# Patient Record
Sex: Female | Born: 1940 | Race: White | Hispanic: No | Marital: Married | Smoking: Former smoker
Health system: Southern US, Community
[De-identification: ages and names within clinical notes are randomized; demographics above are authoritative.]

## PROBLEM LIST (undated history)

## (undated) DIAGNOSIS — I214 Non-ST elevation (NSTEMI) myocardial infarction: Secondary | ICD-10-CM

## (undated) DIAGNOSIS — E890 Postprocedural hypothyroidism: Secondary | ICD-10-CM

## (undated) DIAGNOSIS — I5189 Other ill-defined heart diseases: Secondary | ICD-10-CM

## (undated) DIAGNOSIS — M069 Rheumatoid arthritis, unspecified: Secondary | ICD-10-CM

## (undated) DIAGNOSIS — R001 Bradycardia, unspecified: Secondary | ICD-10-CM

## (undated) DIAGNOSIS — K219 Gastro-esophageal reflux disease without esophagitis: Secondary | ICD-10-CM

## (undated) DIAGNOSIS — R002 Palpitations: Secondary | ICD-10-CM

## (undated) DIAGNOSIS — K5790 Diverticulosis of intestine, part unspecified, without perforation or abscess without bleeding: Secondary | ICD-10-CM

## (undated) DIAGNOSIS — R5383 Other fatigue: Secondary | ICD-10-CM

## (undated) DIAGNOSIS — Z860101 Personal history of adenomatous and serrated colon polyps: Secondary | ICD-10-CM

## (undated) DIAGNOSIS — D563 Thalassemia minor: Secondary | ICD-10-CM

## (undated) DIAGNOSIS — M1712 Unilateral primary osteoarthritis, left knee: Secondary | ICD-10-CM

## (undated) DIAGNOSIS — D1771 Benign lipomatous neoplasm of kidney: Secondary | ICD-10-CM

## (undated) DIAGNOSIS — I2510 Atherosclerotic heart disease of native coronary artery without angina pectoris: Secondary | ICD-10-CM

## (undated) DIAGNOSIS — Z86010 Personal history of colonic polyps: Secondary | ICD-10-CM

## (undated) DIAGNOSIS — E785 Hyperlipidemia, unspecified: Secondary | ICD-10-CM

## (undated) HISTORY — DX: Palpitations: R00.2

## (undated) HISTORY — DX: Diverticulosis of intestine, part unspecified, without perforation or abscess without bleeding: K57.90

## (undated) HISTORY — DX: Unilateral primary osteoarthritis, left knee: M17.12

## (undated) HISTORY — DX: Bradycardia, unspecified: R00.1

## (undated) HISTORY — PX: UPPER GASTROINTESTINAL ENDOSCOPY: SHX188

## (undated) HISTORY — DX: Hyperlipidemia, unspecified: E78.5

## (undated) HISTORY — DX: Postprocedural hypothyroidism: E89.0

## (undated) HISTORY — DX: Thalassemia minor: D56.3

## (undated) HISTORY — DX: Personal history of adenomatous and serrated colon polyps: Z86.0101

## (undated) HISTORY — DX: Personal history of colonic polyps: Z86.010

## (undated) HISTORY — DX: Other fatigue: R53.83

## (undated) HISTORY — DX: Atherosclerotic heart disease of native coronary artery without angina pectoris: I25.10

## (undated) HISTORY — DX: Other ill-defined heart diseases: I51.89

## (undated) HISTORY — DX: Rheumatoid arthritis, unspecified: M06.9

## (undated) HISTORY — PX: BLADDER REPAIR: SHX76

## (undated) HISTORY — DX: Gastro-esophageal reflux disease without esophagitis: K21.9

## (undated) HISTORY — DX: Benign lipomatous neoplasm of kidney: D17.71

---

## 1967-09-14 HISTORY — PX: CHOLECYSTECTOMY: SHX55

## 1985-09-13 HISTORY — PX: ABDOMINAL HYSTERECTOMY: SHX81

## 1999-09-16 ENCOUNTER — Other Ambulatory Visit: Admission: RE | Admit: 1999-09-16 | Discharge: 1999-09-16 | Payer: Self-pay | Admitting: Obstetrics & Gynecology

## 2004-07-27 ENCOUNTER — Ambulatory Visit: Payer: Self-pay | Admitting: Internal Medicine

## 2004-08-11 ENCOUNTER — Ambulatory Visit: Payer: Self-pay | Admitting: Internal Medicine

## 2004-12-04 ENCOUNTER — Ambulatory Visit: Payer: Self-pay | Admitting: Internal Medicine

## 2004-12-15 ENCOUNTER — Ambulatory Visit: Payer: Self-pay | Admitting: Internal Medicine

## 2005-02-18 ENCOUNTER — Ambulatory Visit: Payer: Self-pay | Admitting: Internal Medicine

## 2005-03-24 ENCOUNTER — Ambulatory Visit: Payer: Self-pay | Admitting: Internal Medicine

## 2005-03-31 ENCOUNTER — Ambulatory Visit: Payer: Self-pay | Admitting: Internal Medicine

## 2005-07-02 ENCOUNTER — Ambulatory Visit: Payer: Self-pay | Admitting: Internal Medicine

## 2005-07-13 ENCOUNTER — Ambulatory Visit: Payer: Self-pay | Admitting: Internal Medicine

## 2005-09-13 DIAGNOSIS — M069 Rheumatoid arthritis, unspecified: Secondary | ICD-10-CM

## 2005-09-13 HISTORY — DX: Rheumatoid arthritis, unspecified: M06.9

## 2005-11-18 ENCOUNTER — Ambulatory Visit: Payer: Self-pay | Admitting: Internal Medicine

## 2005-11-25 ENCOUNTER — Encounter: Admission: RE | Admit: 2005-11-25 | Discharge: 2005-11-25 | Payer: Self-pay | Admitting: Internal Medicine

## 2005-12-10 ENCOUNTER — Ambulatory Visit: Payer: Self-pay | Admitting: Internal Medicine

## 2005-12-21 ENCOUNTER — Ambulatory Visit: Payer: Self-pay | Admitting: Internal Medicine

## 2006-01-06 ENCOUNTER — Ambulatory Visit: Payer: Self-pay | Admitting: Internal Medicine

## 2006-01-26 ENCOUNTER — Ambulatory Visit: Payer: Self-pay | Admitting: Internal Medicine

## 2006-02-16 ENCOUNTER — Ambulatory Visit: Payer: Self-pay | Admitting: Internal Medicine

## 2006-02-28 ENCOUNTER — Ambulatory Visit: Payer: Self-pay | Admitting: Internal Medicine

## 2006-04-11 ENCOUNTER — Ambulatory Visit: Payer: Self-pay | Admitting: Internal Medicine

## 2006-04-18 ENCOUNTER — Ambulatory Visit: Payer: Self-pay | Admitting: Internal Medicine

## 2006-05-19 ENCOUNTER — Ambulatory Visit: Payer: Self-pay | Admitting: Internal Medicine

## 2006-06-29 ENCOUNTER — Ambulatory Visit: Payer: Self-pay | Admitting: Internal Medicine

## 2006-08-30 ENCOUNTER — Ambulatory Visit: Payer: Self-pay | Admitting: Internal Medicine

## 2006-09-13 ENCOUNTER — Encounter: Payer: Self-pay | Admitting: Internal Medicine

## 2006-10-28 ENCOUNTER — Ambulatory Visit: Payer: Self-pay | Admitting: Internal Medicine

## 2006-11-07 ENCOUNTER — Ambulatory Visit: Payer: Self-pay | Admitting: Family Medicine

## 2006-11-11 ENCOUNTER — Ambulatory Visit: Payer: Self-pay | Admitting: Gastroenterology

## 2006-12-14 ENCOUNTER — Encounter: Payer: Self-pay | Admitting: Internal Medicine

## 2006-12-14 ENCOUNTER — Ambulatory Visit: Payer: Self-pay | Admitting: Gastroenterology

## 2006-12-14 ENCOUNTER — Encounter (INDEPENDENT_AMBULATORY_CARE_PROVIDER_SITE_OTHER): Payer: Self-pay | Admitting: *Deleted

## 2007-02-16 ENCOUNTER — Ambulatory Visit: Payer: Self-pay | Admitting: Internal Medicine

## 2007-02-16 LAB — CONVERTED CEMR LAB
Folate: 20 ng/mL
H Pylori IgG: NEGATIVE
Iron: 118 ug/dL (ref 42–145)
Vitamin B-12: 634 pg/mL (ref 211–911)

## 2007-03-16 ENCOUNTER — Ambulatory Visit: Payer: Self-pay | Admitting: Internal Medicine

## 2007-05-12 ENCOUNTER — Encounter: Payer: Self-pay | Admitting: Internal Medicine

## 2007-05-12 DIAGNOSIS — M810 Age-related osteoporosis without current pathological fracture: Secondary | ICD-10-CM | POA: Insufficient documentation

## 2007-05-12 DIAGNOSIS — R002 Palpitations: Secondary | ICD-10-CM | POA: Insufficient documentation

## 2007-05-12 DIAGNOSIS — M069 Rheumatoid arthritis, unspecified: Secondary | ICD-10-CM | POA: Insufficient documentation

## 2007-05-12 DIAGNOSIS — M899 Disorder of bone, unspecified: Secondary | ICD-10-CM | POA: Insufficient documentation

## 2007-05-12 DIAGNOSIS — M949 Disorder of cartilage, unspecified: Secondary | ICD-10-CM | POA: Insufficient documentation

## 2007-05-12 DIAGNOSIS — E785 Hyperlipidemia, unspecified: Secondary | ICD-10-CM | POA: Insufficient documentation

## 2007-05-16 ENCOUNTER — Encounter: Payer: Self-pay | Admitting: Internal Medicine

## 2007-06-28 ENCOUNTER — Ambulatory Visit: Payer: Self-pay | Admitting: Internal Medicine

## 2007-06-28 LAB — CONVERTED CEMR LAB
Basophils Absolute: 0 10*3/uL (ref 0.0–0.1)
Basophils Relative: 0.8 % (ref 0.0–1.0)
HCT: 35.9 % — ABNORMAL LOW (ref 36.0–46.0)
Hemoglobin: 12.4 g/dL (ref 12.0–15.0)
MCHC: 34.4 g/dL (ref 30.0–36.0)
MCV: 95.2 fL (ref 78.0–100.0)
Neutrophils Relative %: 56.1 % (ref 43.0–77.0)
RDW: 13.7 % (ref 11.5–14.6)
Vitamin B-12: 793 pg/mL (ref 211–911)

## 2007-07-10 ENCOUNTER — Ambulatory Visit: Payer: Self-pay | Admitting: Internal Medicine

## 2007-07-10 DIAGNOSIS — D568 Other thalassemias: Secondary | ICD-10-CM | POA: Insufficient documentation

## 2007-07-10 DIAGNOSIS — K5730 Diverticulosis of large intestine without perforation or abscess without bleeding: Secondary | ICD-10-CM | POA: Insufficient documentation

## 2007-07-10 DIAGNOSIS — Z860100 Personal history of colon polyps, unspecified: Secondary | ICD-10-CM | POA: Insufficient documentation

## 2007-09-19 ENCOUNTER — Encounter: Payer: Self-pay | Admitting: Internal Medicine

## 2007-10-09 ENCOUNTER — Ambulatory Visit: Payer: Self-pay | Admitting: Internal Medicine

## 2007-10-09 LAB — CONVERTED CEMR LAB
ALT: 28 units/L (ref 0–35)
AST: 24 units/L (ref 0–37)
Alkaline Phosphatase: 43 units/L (ref 39–117)
Cholesterol, target level: 200 mg/dL
Cholesterol: 236 mg/dL (ref 0–200)
Direct LDL: 160.3 mg/dL
HDL: 49 mg/dL (ref >39.0)
LDL Goal: 160 mg/dL
Total Bilirubin: 0.7 mg/dL (ref 0.3–1.2)
Vit D, 1,25-Dihydroxy: 48 (ref 30–89)

## 2007-10-20 ENCOUNTER — Telehealth: Payer: Self-pay | Admitting: *Deleted

## 2007-12-26 ENCOUNTER — Ambulatory Visit: Payer: Self-pay | Admitting: Internal Medicine

## 2007-12-26 DIAGNOSIS — J309 Allergic rhinitis, unspecified: Secondary | ICD-10-CM | POA: Insufficient documentation

## 2007-12-27 ENCOUNTER — Telehealth: Payer: Self-pay | Admitting: Internal Medicine

## 2008-02-08 ENCOUNTER — Encounter: Payer: Self-pay | Admitting: Internal Medicine

## 2008-03-26 ENCOUNTER — Ambulatory Visit: Payer: Self-pay | Admitting: Internal Medicine

## 2008-04-04 ENCOUNTER — Telehealth: Payer: Self-pay | Admitting: Internal Medicine

## 2008-04-05 ENCOUNTER — Ambulatory Visit: Payer: Self-pay | Admitting: Internal Medicine

## 2008-04-30 ENCOUNTER — Telehealth: Payer: Self-pay | Admitting: Internal Medicine

## 2008-06-06 ENCOUNTER — Encounter: Payer: Self-pay | Admitting: Internal Medicine

## 2008-06-27 ENCOUNTER — Ambulatory Visit: Payer: Self-pay | Admitting: Internal Medicine

## 2008-06-27 DIAGNOSIS — T887XXA Unspecified adverse effect of drug or medicament, initial encounter: Secondary | ICD-10-CM | POA: Insufficient documentation

## 2008-06-27 LAB — CONVERTED CEMR LAB
ALT: 31 units/L (ref 0–35)
AST: 26 units/L (ref 0–37)
Albumin: 4 g/dL (ref 3.5–5.2)
Alkaline Phosphatase: 49 units/L (ref 39–117)
CO2: 29 meq/L (ref 19–32)
Creatinine, Ser: 0.8 mg/dL (ref 0.4–1.2)
Eosinophils Absolute: 0.1 10*3/uL (ref 0.0–0.7)
GFR calc Af Amer: 92 mL/min
Lymphocytes Relative: 26.4 % (ref 12.0–46.0)
Monocytes Absolute: 0.4 10*3/uL (ref 0.1–1.0)
Monocytes Relative: 6.9 % (ref 3.0–12.0)
Neutro Abs: 4 10*3/uL (ref 1.4–7.7)
Neutrophils Relative %: 63.9 % (ref 43.0–77.0)
Platelets: 240 10*3/uL (ref 150–400)
RDW: 13.2 % (ref 11.5–14.6)
Total CHOL/HDL Ratio: 4.4
Triglycerides: 80 mg/dL (ref 0–149)
VLDL: 16 mg/dL (ref 0–40)

## 2008-07-08 LAB — CONVERTED CEMR LAB: Vit D, 1,25-Dihydroxy: 38 (ref 30–89)

## 2008-07-29 ENCOUNTER — Telehealth: Payer: Self-pay | Admitting: *Deleted

## 2008-08-20 ENCOUNTER — Encounter: Payer: Self-pay | Admitting: Internal Medicine

## 2008-08-23 ENCOUNTER — Encounter: Admission: RE | Admit: 2008-08-23 | Discharge: 2008-08-23 | Payer: Self-pay | Admitting: Internal Medicine

## 2008-08-29 ENCOUNTER — Telehealth (INDEPENDENT_AMBULATORY_CARE_PROVIDER_SITE_OTHER): Payer: Self-pay | Admitting: *Deleted

## 2008-10-07 ENCOUNTER — Encounter: Payer: Self-pay | Admitting: Internal Medicine

## 2008-11-07 ENCOUNTER — Ambulatory Visit: Payer: Self-pay | Admitting: Internal Medicine

## 2008-11-07 DIAGNOSIS — M79609 Pain in unspecified limb: Secondary | ICD-10-CM | POA: Insufficient documentation

## 2008-11-19 ENCOUNTER — Encounter: Payer: Self-pay | Admitting: Internal Medicine

## 2008-12-05 ENCOUNTER — Ambulatory Visit: Payer: Self-pay | Admitting: Internal Medicine

## 2008-12-18 ENCOUNTER — Encounter: Payer: Self-pay | Admitting: Internal Medicine

## 2009-01-06 ENCOUNTER — Ambulatory Visit: Payer: Self-pay | Admitting: Internal Medicine

## 2009-01-07 ENCOUNTER — Ambulatory Visit: Payer: Self-pay | Admitting: Internal Medicine

## 2009-01-07 ENCOUNTER — Encounter: Payer: Self-pay | Admitting: Internal Medicine

## 2009-01-09 ENCOUNTER — Encounter: Payer: Self-pay | Admitting: Internal Medicine

## 2009-02-27 ENCOUNTER — Telehealth: Payer: Self-pay | Admitting: Internal Medicine

## 2009-02-27 ENCOUNTER — Ambulatory Visit: Payer: Self-pay | Admitting: Family Medicine

## 2009-02-27 DIAGNOSIS — S139XXA Sprain of joints and ligaments of unspecified parts of neck, initial encounter: Secondary | ICD-10-CM | POA: Insufficient documentation

## 2009-04-07 ENCOUNTER — Ambulatory Visit: Payer: Self-pay | Admitting: Internal Medicine

## 2009-04-07 DIAGNOSIS — M19049 Primary osteoarthritis, unspecified hand: Secondary | ICD-10-CM | POA: Insufficient documentation

## 2009-05-08 ENCOUNTER — Ambulatory Visit: Payer: Self-pay | Admitting: Internal Medicine

## 2009-05-14 ENCOUNTER — Telehealth (INDEPENDENT_AMBULATORY_CARE_PROVIDER_SITE_OTHER): Payer: Self-pay | Admitting: *Deleted

## 2009-05-15 ENCOUNTER — Encounter: Payer: Self-pay | Admitting: Internal Medicine

## 2009-07-23 ENCOUNTER — Ambulatory Visit: Payer: Self-pay | Admitting: Internal Medicine

## 2009-07-23 LAB — CONVERTED CEMR LAB
ALT: 23 units/L (ref 0–35)
AST: 26 units/L (ref 0–37)
Albumin: 3.7 g/dL (ref 3.5–5.2)
Alkaline Phosphatase: 52 units/L (ref 39–117)
Basophils Absolute: 0.1 10*3/uL (ref 0.0–0.1)
Bilirubin, Direct: 0.1 mg/dL (ref 0.0–0.3)
CO2: 27 meq/L (ref 19–32)
Cholesterol: 225 mg/dL — ABNORMAL HIGH (ref 0–200)
Direct LDL: 154.2 mg/dL
Eosinophils Absolute: 0.1 10*3/uL (ref 0.0–0.7)
Eosinophils Relative: 2.7 % (ref 0.0–5.0)
Glucose, Urine, Semiquant: NEGATIVE
Hemoglobin: 12.7 g/dL (ref 12.0–15.0)
Ketones, urine, test strip: NEGATIVE
Lymphocytes Relative: 32 % (ref 12.0–46.0)
Neutro Abs: 2.9 10*3/uL (ref 1.4–7.7)
Neutrophils Relative %: 55.5 % (ref 43.0–77.0)
Protein, U semiquant: NEGATIVE
RBC: 3.84 M/uL — ABNORMAL LOW (ref 3.87–5.11)
Sodium: 139 meq/L (ref 135–145)
Specific Gravity, Urine: 1.015
Total Protein: 6.9 g/dL (ref 6.0–8.3)
WBC Urine, dipstick: NEGATIVE
WBC: 5.2 10*3/uL (ref 4.5–10.5)
pH: 7

## 2009-07-30 ENCOUNTER — Ambulatory Visit: Payer: Self-pay | Admitting: Internal Medicine

## 2009-08-13 ENCOUNTER — Encounter (INDEPENDENT_AMBULATORY_CARE_PROVIDER_SITE_OTHER): Payer: Self-pay | Admitting: *Deleted

## 2009-08-29 ENCOUNTER — Telehealth: Payer: Self-pay | Admitting: Internal Medicine

## 2009-09-24 ENCOUNTER — Encounter: Payer: Self-pay | Admitting: Internal Medicine

## 2009-11-21 ENCOUNTER — Ambulatory Visit: Payer: Self-pay | Admitting: Internal Medicine

## 2009-11-21 LAB — CONVERTED CEMR LAB
Cholesterol: 228 mg/dL — ABNORMAL HIGH (ref 0–200)
HDL: 62.6 mg/dL (ref >39.00)
Total CHOL/HDL Ratio: 4
Total Protein: 6.8 g/dL (ref 6.0–8.3)

## 2009-11-28 ENCOUNTER — Ambulatory Visit: Payer: Self-pay | Admitting: Internal Medicine

## 2009-11-28 LAB — CONVERTED CEMR LAB: Cholesterol, target level: 200 mg/dL

## 2010-01-21 ENCOUNTER — Encounter: Payer: Self-pay | Admitting: Internal Medicine

## 2010-04-08 ENCOUNTER — Ambulatory Visit: Payer: Self-pay | Admitting: Internal Medicine

## 2010-04-08 LAB — CONVERTED CEMR LAB
ALT: 20 units/L (ref 0–35)
AST: 22 units/L (ref 0–37)
Albumin: 3.8 g/dL (ref 3.5–5.2)
Alkaline Phosphatase: 51 units/L (ref 39–117)
Bilirubin, Direct: 0.1 mg/dL (ref 0.0–0.3)
Direct LDL: 166.1 mg/dL
HDL: 61.9 mg/dL (ref >39.00)
Total Protein: 6.7 g/dL (ref 6.0–8.3)
Triglycerides: 79 mg/dL (ref 0.0–149.0)

## 2010-05-13 ENCOUNTER — Ambulatory Visit: Payer: Self-pay | Admitting: Internal Medicine

## 2010-05-21 ENCOUNTER — Encounter: Payer: Self-pay | Admitting: Internal Medicine

## 2010-08-13 ENCOUNTER — Ambulatory Visit: Payer: Self-pay | Admitting: Internal Medicine

## 2010-08-13 LAB — CONVERTED CEMR LAB
Albumin: 3.8 g/dL (ref 3.5–5.2)
Bilirubin, Direct: 0.4 mg/dL — ABNORMAL HIGH (ref 0.0–0.3)
Cholesterol: 193 mg/dL (ref 0–200)
LDL Cholesterol: 128 mg/dL — ABNORMAL HIGH (ref 0–99)
Total Protein: 6.7 g/dL (ref 6.0–8.3)
Triglycerides: 29 mg/dL (ref 0.0–149.0)

## 2010-08-20 ENCOUNTER — Ambulatory Visit: Payer: Self-pay | Admitting: Internal Medicine

## 2010-08-20 LAB — CONVERTED CEMR LAB
ALT: 18 U/L
AST: 24 U/L
Albumin: 3.8 g/dL
Alkaline Phosphatase: 52 U/L
Bilirubin, Direct: 0.1 mg/dL
HDL goal, serum: 40 mg/dL
Total Bilirubin: 0.6 mg/dL
Total Protein: 6.6 g/dL

## 2010-08-24 ENCOUNTER — Telehealth: Payer: Self-pay | Admitting: Internal Medicine

## 2010-09-13 DIAGNOSIS — E890 Postprocedural hypothyroidism: Secondary | ICD-10-CM

## 2010-09-13 HISTORY — PX: VAGINAL PROLAPSE REPAIR: SHX830

## 2010-09-13 HISTORY — DX: Postprocedural hypothyroidism: E89.0

## 2010-09-13 HISTORY — PX: THYROIDECTOMY: SHX17

## 2010-09-17 ENCOUNTER — Encounter: Payer: Self-pay | Admitting: Internal Medicine

## 2010-10-15 NOTE — Letter (Signed)
Summary: [facility]   Imported By: Maryln Gottron 09/28/2010 15:29:43  _____________________________________________________________________  External Attachment:    Type:   Image     Comment:   External Document

## 2010-10-15 NOTE — Assessment & Plan Note (Signed)
Summary: 4 month rov/njr PT RSC/NJR   Vital Signs:  Patient profile:   70 year old female Height:      68 inches Weight:      166 pounds BMI:     25.33 Temp:     98.2 degrees F oral Pulse rate:   72 / minute Resp:     14 per minute BP sitting:   120 / 70  (left arm)  Vitals Entered By: Willy Eddy, LPN (May 13, 2010 2:14 PM) CC: roal labs Is Patient Diabetic? No   Primary Care Provider:  Stacie Glaze MD  CC:  roal labs.  History of Present Illness: lipid management  Hyperlipidemia Follow-Up      This is a 70 year old woman who presents for Hyperlipidemia follow-up.  The patient denies muscle aches, GI upset, abdominal pain, flushing, itching, constipation, diarrhea, and fatigue.  The patient denies the following symptoms: chest pain/pressure, exercise intolerance, dypsnea, palpitations, syncope, and pedal edema.  Compliance with medications (by patient report) has been near 100%.  Dietary compliance has been excellent.  The patient reports exercising daily.  Adjunctive measures currently used by the patient include fish oil supplements.    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  (ICD-715.34) 2)  Cervical Spasm  (ICD-847.0) 3)  Foot Pain, Chronic  (ICD-729.5) 4)  Uns Advrs Eff Uns Rx Medicinal&biological Sbstnc  (ICD-995.20) 5)  Allergic Rhinitis  (ICD-477.9) 6)  Hypertension  (ICD-401.9) 7)  Thalassemia Nec  (ICD-282.49) 8)  Diverticulosis, Colon  (ICD-562.10) 9)  Colonic Polyps, Hx of  (ICD-V12.72) 10)  Osteopenia  (ICD-733.90) 11)  Palpitations  (ICD-785.1) 12)  Osteoporosis  (ICD-733.00) 13)  Rheumatoid Arthritis  (ICD-714.0) 14)  Hyperlipidemia  (ICD-272.4)  Current Problems (verified): 1)  Loc  Osteoarthros Not Spec Whether Prim/sec Hand  (ICD-715.34) 2)  Cervical Spasm  (ICD-847.0) 3)  Foot Pain, Chronic  (ICD-729.5) 4)  Uns Advrs Eff Uns Rx Medicinal&biological Sbstnc  (ICD-995.20) 5)  Allergic Rhinitis  (ICD-477.9) 6)  Hypertension  (ICD-401.9) 7)  Thalassemia Nec  (ICD-282.49) 8)  Diverticulosis, Colon  (ICD-562.10) 9)  Colonic Polyps, Hx of  (ICD-V12.72) 10)  Osteopenia  (ICD-733.90) 11)  Palpitations  (ICD-785.1) 12)  Osteoporosis  (ICD-733.00) 13)  Rheumatoid Arthritis  (ICD-714.0) 14)  Hyperlipidemia  (ICD-272.4)  Medications Prior to Update: 1)  Aspirin 81 Mg  Tbec (Aspirin) .... Once Daily 2)  Nasonex 50 Mcg/act  Susp (Mometasone Furoate) .... As Needed 3)  Astelin 137 Mcg/spray  Soln (Azelastine Hcl) .... As Needed 4)  Methotrexate 2.5 Mg  Tabs (Methotrexate Sodium) .... 6 Tabs Per Week 5)  Multivitamins   Caps (Multiple Vitamin) .... Once Daily 6)  Tylenol 325 Mg  Tabs (Acetaminophen) .... As Needed 7)  Aciphex 20 Mg  Tbec (Rabeprazole Sodium) .... As Needed 8)  Vitamin D3 1000 Unit Caps (Cholecalciferol) .Marland Kitchen.. 1 Two Times A Day 9)  Caltrate 600+d Plus 600-400 Mg-Unit Tabs (Calcium Carbonate-Vit D-Min) .... 2 Once Daily 10)  Viactiv 500-100-40  Chew (Calcium-Vitamin D-Vitamin K) .... Once Daily 11)  Vitamin B-12 1000 Mcg  Tabs (Cyanocobalamin) .... Once Daily 12)  Folic Acid 1 Mg  Tabs (Folic  Acid) .... One By Mouth Daily 13)  Fish Oil 1600 Mg/.1 Tsp .Marland Kitchen.. 1 Tsp Two Times A Day 14)  Vagifem 10 Mcg Tabs (Estradiol) .Marland Kitchen.. 1 Per Week 15)  Ocusoft Lid Scrub  Pads (Eyelid Cleansers) .... Two Times A Day 16)  Doxycycline Hyclate 100 Mg Solr (Doxycycline Hyclate) .Marland Kitchen.. 1 Once Daily As Needed 17)  Azasite 1 % Soln (Azithromycin) .Marland Kitchen.. 1 Once Daily As Needed  Current Medications (verified): 1)  Aspirin 81 Mg  Tbec (Aspirin) .... Once Daily 2)  Nasonex 50 Mcg/act  Susp (Mometasone Furoate) .... As Needed 3)  Astelin 137 Mcg/spray  Soln (Azelastine Hcl) .... As Needed 4)   Methotrexate 2.5 Mg  Tabs (Methotrexate Sodium) .... 6 Tabs Per Week 5)  Multivitamins   Caps (Multiple Vitamin) .... Once Daily 6)  Tylenol 325 Mg  Tabs (Acetaminophen) .... As Needed 7)  Aciphex 20 Mg  Tbec (Rabeprazole Sodium) .... As Needed 8)  Vitamin D3 1000 Unit Caps (Cholecalciferol) .Marland Kitchen.. 1 Two Times A Day 9)  Caltrate 600+d Plus 600-400 Mg-Unit Tabs (Calcium Carbonate-Vit D-Min) .... 2 Once Daily 10)  Viactiv 500-100-40  Chew (Calcium-Vitamin D-Vitamin K) .... Once Daily 11)  Vitamin B-12 1000 Mcg  Tabs (Cyanocobalamin) .... Once Daily 12)  Folic Acid 1 Mg  Tabs (Folic Acid) .... One By Mouth Daily 13)  Fish Oil 1600 Mg/.1 Tsp .Marland Kitchen.. 1 Tsp Two Times A Day 14)  Vagifem 10 Mcg Tabs (Estradiol) .Marland Kitchen.. 1 Per Week 15)  Ocusoft Lid Scrub  Pads (Eyelid Cleansers) .... Two Times A Day 16)  Doxycycline Hyclate 100 Mg Solr (Doxycycline Hyclate) .Marland Kitchen.. 1 Once Daily As Needed 17)  Azasite 1 % Soln (Azithromycin) .Marland Kitchen.. 1 Once Daily As Needed  Allergies (verified): 1)  ! Vagisil (Benzocaine-Resorcinol)  Past History:  Family History: Last updated: 07/10/2007 mother dementia father killed early Family History Hypertension  Social History: Last updated: 07/10/2007 Married Former Smoker Alcohol use-yes Drug use-no  Risk Factors: Alcohol Use: 1 (05/13/2010) Caffeine Use: 2 (07/10/2007)  Risk Factors: Smoking Status: quit (05/13/2010) Packs/Day: 0.25 (05/13/2010) Passive Smoke Exposure: no (05/13/2010)  Past medical, surgical, family and social histories (including risk factors) reviewed, and no changes noted (except as noted below).  Past Medical History: Reviewed history from 12/26/2007 and no changes required. Hyperlipidemia Rheumatoid arthritis Osteopenia thalium palpitations Colonic polyps, hx of Diverticulosis, colon Hypertension Allergic rhinitis  Past Surgical History: Reviewed history from 07/10/2007 and no changes required. Colon  polypectomy Cholecystectomy Hysterectomy  Family History: Reviewed history from 07/10/2007 and no changes required. mother dementia father killed early Family History Hypertension  Social History: Reviewed history from 07/10/2007 and no changes required. Married Former Smoker Alcohol use-yes Drug use-no  Review of Systems  The patient denies anorexia, fever, weight loss, weight gain, vision loss, decreased hearing, hoarseness, chest pain, syncope, dyspnea on exertion, peripheral edema, prolonged cough, headaches, hemoptysis, abdominal pain, melena, hematochezia, severe indigestion/heartburn, hematuria, incontinence, genital sores, muscle weakness, suspicious skin lesions, transient blindness, difficulty walking, depression, unusual weight change, abnormal bleeding, enlarged lymph nodes, angioedema, and breast masses.    Physical Exam  General:  Well-developed,well-nourished,in no acute distress; alert,appropriate and cooperative throughout examination Head:  Normocephalic and atraumatic without obvious abnormalities. No apparent alopecia or balding. Eyes:  pupils equal and pupils round.   Ears:  R ear normal and L ear normal.   Mouth:  Oral mucosa and oropharynx without lesions or exudates.  Teeth in good repair. Neck:  No deformities, masses, or tenderness noted. Lungs:  Normal  respiratory effort, chest expands symmetrically. Lungs are clear to auscultation, no crackles or wheezes. Heart:  Normal rate and regular rhythm. S1 and S2 normal without gallop, murmur, click, rub or other extra sounds.   Impression & Recommendations:  Problem # 1:  HYPERTENSION (ICD-401.9)  BP today: 120/70 Prior BP: 136/80 (11/28/2009)  Prior 10 Yr Risk Heart Disease: Not enough information (10/09/2007)  Labs Reviewed: K+: 3.6 (07/23/2009) Creat: : 0.7 (07/23/2009)   Chol: 251 (04/08/2010)   HDL: 61.90 (04/08/2010)   LDL: DEL (06/27/2008)   TG: 79.0 (04/08/2010)  Problem # 2:  HYPERLIPIDEMIA,  WITH HIGH HDL (ICD-272.4) add livalo pulse and moniter in 3 months Labs Reviewed: SGOT: 22 (04/08/2010)   SGPT: 20 (04/08/2010)  Lipid Goals: Chol Goal: 200 (11/28/2009)   HDL Goal: 40 (11/28/2009)   LDL Goal: 160 (11/28/2009)   TG Goal: 150 (11/28/2009)  Prior 10 Yr Risk Heart Disease: Not enough information (10/09/2007)   HDL:61.90 (04/08/2010), 62.60 (11/21/2009)  LDL:DEL (06/27/2008), DEL (10/09/2007)  Chol:251 (04/08/2010), 228 (11/21/2009)  Trig:79.0 (04/08/2010), 86.0 (11/21/2009)  Her updated medication list for this problem includes:    Livalo 4 Mg Tabs (Pitavastatin calcium) ..... One by mouth every monday night  Complete Medication List: 1)  Aspirin 81 Mg Tbec (Aspirin) .... Once daily 2)  Nasonex 50 Mcg/act Susp (Mometasone furoate) .... As needed 3)  Astelin 137 Mcg/spray Soln (Azelastine hcl) .... As needed 4)  Methotrexate 2.5 Mg Tabs (Methotrexate sodium) .... 6 tabs per week 5)  Multivitamins Caps (Multiple vitamin) .... Once daily 6)  Tylenol 325 Mg Tabs (Acetaminophen) .... As needed 7)  Aciphex 20 Mg Tbec (Rabeprazole sodium) .... As needed 8)  Vitamin D3 1000 Unit Caps (Cholecalciferol) .Marland Kitchen.. 1 two times a day 9)  Caltrate 600+d Plus 600-400 Mg-unit Tabs (Calcium carbonate-vit d-min) .... 2 once daily 10)  Viactiv 500-100-40 Chew (Calcium-vitamin d-vitamin k) .... Once daily 11)  Vitamin B-12 1000 Mcg Tabs (Cyanocobalamin) .... Once daily 12)  Folic Acid 1 Mg Tabs (Folic acid) .... One by mouth daily 13)  Fish Oil 1600 Mg/.1 Tsp  .Marland Kitchen.. 1 tsp two times a day 14)  Vagifem 10 Mcg Tabs (Estradiol) .Marland Kitchen.. 1 per week 15)  Ocusoft Lid Scrub Pads (Eyelid cleansers) .... Two times a day 16)  Doxycycline Hyclate 100 Mg Solr (Doxycycline hyclate) .Marland Kitchen.. 1 once daily as needed 17)  Azasite 1 % Soln (Azithromycin) .Marland Kitchen.. 1 once daily as needed 18)  Livalo 4 Mg Tabs (Pitavastatin calcium) .... One by mouth every monday night  Patient Instructions: 1)  Please schedule a follow-up  appointment in 3 months. 2)  Hepatic Panel prior to visit, ICD-9:995.20 3)  Lipid Panel prior to visit, ICD-9:272.4

## 2010-10-15 NOTE — Letter (Signed)
Summary: [facility]   Imported By: Maryln Gottron 06/02/2010 10:38:45  _____________________________________________________________________  External Attachment:    Type:   Image     Comment:   External Document

## 2010-10-15 NOTE — Assessment & Plan Note (Signed)
Summary: 3 month fup//ccm   Vital Signs:  Patient profile:   70 year old female Height:      68 inches Weight:      166 pounds BMI:     25.33 Temp:     98.2 degrees F oral Pulse rate:   60 / minute Resp:     14 per minute BP sitting:   130 / 70  (left arm)  Vitals Entered By: Willy Eddy, LPN (August 20, 2010 9:05 AM) CC: roa labs, Lipid Management Is Patient Diabetic? No   Primary Care Provider:  Stacie Glaze MD  CC:  roa labs and Lipid Management.  History of Present Illness: Has not been exercizing. Hyperlipidemia Follow-Up      This is a 70 year old woman who presents for Hyperlipidemia follow-up.  The patient denies muscle aches, GI upset, abdominal pain, flushing, itching, constipation, diarrhea, and fatigue.  The patient denies the following symptoms: chest pain/pressure, exercise intolerance, dypsnea, palpitations, syncope, and pedal edema.  Compliance with medications (by patient report) has been near 100%.  Dietary compliance has been excellent.  The patient reports exercising occasionally.  Adjunctive measures currently used by the patient include weight reduction.    Lipid Management History:      Positive NCEP/ATP III risk factors include female age 69 years old or older and hypertension.  Negative NCEP/ATP III risk factors include non-diabetic, no family history for ischemic heart disease, non-tobacco-user status, no ASHD (atherosclerotic heart disease), no prior stroke/TIA, no peripheral vascular disease, and no history of aortic aneurysm.     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  (ICD-715.34) 3)  Cervical Spasm  (ICD-847.0) 4)  Foot Pain, Chronic   (ICD-729.5) 5)  Uns Advrs Eff Uns Rx Medicinal&biological Sbstnc  (ICD-995.20) 6)  Allergic Rhinitis  (ICD-477.9) 7)  Hypertension  (ICD-401.9) 8)  Thalassemia Nec  (ICD-282.49) 9)  Diverticulosis, Colon  (ICD-562.10) 10)  Colonic Polyps, Hx of  (ICD-V12.72) 11)  Osteopenia  (ICD-733.90) 12)  Palpitations  (ICD-785.1) 13)  Osteoporosis  (ICD-733.00) 14)  Rheumatoid Arthritis  (ICD-714.0) 15)  Hyperlipidemia  (ICD-272.4)  Current Problems (verified): 1)  Hyperlipidemia, With High Hdl  (ICD-272.4) 2)  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 Once Daily 2)  Nasonex 50 Mcg/act  Susp (Mometasone Furoate) .... As Needed 3)  Astelin 137 Mcg/spray  Soln (Azelastine Hcl) .... As Needed 4)  Methotrexate 2.5 Mg  Tabs (Methotrexate Sodium) .... 6 Tabs Per Week 5)  Multivitamins   Caps (Multiple Vitamin) .... Once Daily 6)  Tylenol  325 Mg  Tabs (Acetaminophen) .... As Needed 7)  Aciphex 20 Mg  Tbec (Rabeprazole Sodium) .... As Needed 8)  Vitamin D3 1000 Unit Caps (Cholecalciferol) .Marland Kitchen.. 1 Two Times A Day 9)  Caltrate 600+d Plus 600-400 Mg-Unit Tabs (Calcium Carbonate-Vit D-Min) .... 2 Once Daily 10)  Viactiv 500-100-40  Chew (Calcium-Vitamin D-Vitamin K) .... Once Daily 11)  Vitamin B-12 1000 Mcg  Tabs (Cyanocobalamin) .... Once Daily 12)  Folic Acid 1 Mg  Tabs (Folic Acid) .... One By Mouth Daily 13)  Fish Oil 1600 Mg/.1 Tsp  .Marland Kitchen.. 1 Tsp Two Times A Day 14)  Vagifem 10 Mcg Tabs (Estradiol) .Marland Kitchen.. 1 Per Week 15)  Ocusoft Lid Scrub  Pads (Eyelid Cleansers) .... Two Times A Day 16)  Doxycycline Hyclate 100 Mg Solr (Doxycycline Hyclate) .Marland Kitchen.. 1 Once Daily As Needed 17)  Azasite 1 % Soln (Azithromycin) .Marland Kitchen.. 1 Once Daily As Needed 18)  Livalo 4 Mg Tabs (Pitavastatin Calcium) .... One By Mouth Every Monday Night  Current Medications (verified): 1)  Aspirin 81 Mg  Tbec (Aspirin) .... Once Daily 2)  Nasonex 50 Mcg/act  Susp (Mometasone Furoate) .... As Needed 3)  Astelin 137 Mcg/spray  Soln (Azelastine Hcl) .... As Needed 4)  Methotrexate 2.5 Mg  Tabs (Methotrexate Sodium) .... 6 Tabs Per Week 5)  Multivitamins   Caps (Multiple Vitamin) .... Once Daily 6)  Tylenol 325 Mg  Tabs (Acetaminophen) .... As Needed 7)  Aciphex 20 Mg  Tbec (Rabeprazole Sodium) .... As Needed 8)  Vitamin D3 1000 Unit Caps (Cholecalciferol) .Marland Kitchen.. 1 Two Times A Day 9)  Caltrate 600+d Plus 600-400 Mg-Unit Tabs (Calcium Carbonate-Vit D-Min) .... 2 Once Daily 10)  Viactiv 500-100-40  Chew (Calcium-Vitamin D-Vitamin K) .... Once Daily 11)  Vitamin B-12 1000 Mcg  Tabs (Cyanocobalamin) .... Once Daily 12)  Folic Acid 1 Mg  Tabs (Folic Acid) .... One By Mouth Daily 13)  Fish Oil 1600 Mg/.1 Tsp .Marland Kitchen.. 1 Tsp Two Times A Day 14)  Vagifem 10 Mcg Tabs (Estradiol) .Marland Kitchen.. 1 Per Week 15)  Ocusoft Lid Scrub  Pads (Eyelid Cleansers) .... Two Times A Day 16)  Doxycycline Hyclate 100 Mg Solr (Doxycycline Hyclate) .Marland Kitchen.. 1 Once Daily As Needed 17)  Azasite 1 % Soln (Azithromycin) .Marland Kitchen.. 1 Once Daily As Needed 18)  Livalo 4 Mg Tabs (Pitavastatin Calcium) .... One By Mouth Every Monday Night  Allergies (verified): 1)  ! Vagisil (Benzocaine-Resorcinol)  Past History:  Family History: Last updated: 07/10/2007 mother dementia father killed early Family History Hypertension  Social History: Last updated: 07/10/2007 Married Former Smoker Alcohol use-yes Drug  use-no  Risk Factors: Alcohol Use: 1 (08/20/2010) Caffeine Use: 2 (07/10/2007)  Risk Factors: Smoking Status: quit (08/20/2010) Packs/Day: 0.25 (08/20/2010) Passive Smoke Exposure: no (08/20/2010)  Past medical, surgical, family and social histories (including risk factors) reviewed, and no changes noted (except as noted below).  Past Medical History: Reviewed history from 12/26/2007 and no changes required. Hyperlipidemia Rheumatoid arthritis Osteopenia thalium palpitations Colonic polyps, hx of Diverticulosis, colon Hypertension Allergic rhinitis  Past Surgical History: Reviewed history from 07/10/2007 and no changes required. Colon polypectomy Cholecystectomy Hysterectomy  Family History: Reviewed history from 07/10/2007 and no changes required. mother dementia father killed early Family History Hypertension  Social History: Reviewed history from 07/10/2007 and no changes required. Married Former Smoker Alcohol use-yes Drug use-no  Review of Systems       Flu Vaccine Consent Questions     Do you have a history of severe allergic reactions  to this vaccine? no    Any prior history of allergic reactions to egg and/or gelatin? no    Do you have a sensitivity to the preservative Thimersol? no    Do you have a past history of Guillan-Barre Syndrome? no    Do you currently have an acute febrile illness? no    Have you ever had a severe reaction to latex? no    Vaccine information given and explained to patient? yes    Are you currently pregnant? no    Lot Number:[id number]   Exp Date:03/13/2011   Site Given  Left Deltoid IM   Physical Exam  General:  Well-developed,well-nourished,in no acute distress; alert,appropriate and cooperative throughout examination Head:  Normocephalic and atraumatic without obvious abnormalities. No apparent alopecia or balding. Eyes:  pupils equal and pupils round.   Ears:  R ear normal and L ear normal.   Neck:  supple, no  masses, and decreased ROM.   Lungs:  Normal respiratory effort, chest expands symmetrically. Lungs are clear to auscultation, no crackles or wheezes. Heart:  Normal rate and regular rhythm. S1 and S2 normal without gallop, murmur, click, rub or other extra sounds. Abdomen:  Bowel sounds positive,abdomen soft and non-tender without masses, organomegaly or hernias noted.   Impression & Recommendations:  Problem # 1:  HYPERLIPIDEMIA, WITH HIGH HDL (ICD-272.4) Assessment Improved  Her updated medication list for this problem includes:    Livalo 4 Mg Tabs (Pitavastatin calcium) ..... One by mouth every monday night  Labs Reviewed: SGOT: 47 (08/13/2010)   SGPT: 19 (08/13/2010)  Lipid Goals: Chol Goal: 200 (08/20/2010)   HDL Goal: 40 (08/20/2010)   LDL Goal: 130 (08/20/2010)   TG Goal: 150 (08/20/2010)  10 Yr Risk Heart Disease: 9 % Prior 10 Yr Risk Heart Disease: Not enough information (10/09/2007)   HDL:58.80 (08/13/2010), 61.90 (04/08/2010)  LDL:128 (08/13/2010), DEL (06/27/2008)  Chol:193 (08/13/2010), 251 (04/08/2010)  Trig:29.0 (08/13/2010), 79.0 (04/08/2010)  Problem # 2:  UNS ADVRS EFF UNS RX MEDICINAL&BIOLOGICAL SBSTNC (ICD-995.20) Assessment: Deteriorated  slight bump in the liver functons... moniter today to see if this represents drug or virus???  Orders: TLB-Hepatic/Liver Function Pnl (80076-HEPATIC) Venipuncture (54098)  Problem # 3:  CERVICAL SPASM (ICD-847.0) Assessment: Deteriorated  the pt needs neck exercizes Her updated medication list for this problem includes:    Aspirin 81 Mg Tbec (Aspirin) ..... Once daily    Tylenol 325 Mg Tabs (Acetaminophen) .Marland Kitchen... As needed  Discussed exercises and use of moist heat or cold and medication.   Complete Medication List: 1)  Aspirin 81 Mg Tbec (Aspirin) .... Once daily 2)  Nasonex 50 Mcg/act Susp (Mometasone furoate) .... As needed 3)  Astelin 137 Mcg/spray Soln (Azelastine hcl) .... As needed 4)  Methotrexate 2.5 Mg  Tabs (Methotrexate sodium) .... 6 tabs per week 5)  Multivitamins Caps (Multiple vitamin) .... Once daily 6)  Tylenol 325 Mg Tabs (Acetaminophen) .... As needed 7)  Aciphex 20 Mg Tbec (Rabeprazole sodium) .... As needed 8)  Vitamin D3 1000 Unit Caps (Cholecalciferol) .Marland Kitchen.. 1 two times a day 9)  Caltrate 600+d Plus 600-400 Mg-unit Tabs (Calcium carbonate-vit d-min) .... 2 once daily 10)  Viactiv 500-100-40 Chew (Calcium-vitamin d-vitamin k) .... Once daily 11)  Vitamin B-12 1000 Mcg Tabs (Cyanocobalamin) .... Once daily 12)  Folic Acid 1 Mg Tabs (Folic acid) .... One by mouth daily 13)  Fish Oil 1600 Mg/.1 Tsp  .Marland Kitchen.. 1 tsp two times a day 14)  Vagifem 10 Mcg Tabs (Estradiol) .Marland KitchenMarland KitchenMarland Kitchen  1 per week 15)  Ocusoft Lid Scrub Pads (Eyelid cleansers) .... Two times a day 16)  Doxycycline Hyclate 100 Mg Solr (Doxycycline hyclate) .Marland Kitchen.. 1 once daily as needed 17)  Azasite 1 % Soln (Azithromycin) .Marland Kitchen.. 1 once daily as needed 18)  Livalo 4 Mg Tabs (Pitavastatin calcium) .... One by mouth every monday night  Other Orders: Flu Vaccine 27yrs + MEDICARE PATIENTS (E4540) Administration Flu vaccine - MCR (G0008)  Lipid Assessment/Plan:      Based on NCEP/ATP III, the patient's risk factor category is "2 or more risk factors and a calculated 10 year CAD risk of < 20%".  The patient's lipid goals are as follows: Total cholesterol goal is 200; LDL cholesterol goal is 130; HDL cholesterol goal is 40; Triglyceride goal is 150.    Patient Instructions: 1)  regular neck exercizes to relax.... the clock in both direction, shoulder shrug and relaxation and then turn and stretch 2)  Please schedule a follow-up appointment in 4 months. 3)  Hepatic Panel prior to visit, ICD-9:995.20 4)  Lipid Panel prior to visit, ICD-9:272.4   Orders Added: 1)  Flu Vaccine 19yrs + MEDICARE PATIENTS [Q2039] 2)  Administration Flu vaccine - MCR [G0008] 3)  Est. Patient Level IV [98119] 4)  TLB-Hepatic/Liver Function Pnl [80076-HEPATIC] 5)   Venipuncture [14782]  Appended Document: Orders Update    Clinical Lists Changes  Orders: Added new Service order of Specimen Handling ([id number]) - Signed

## 2010-10-15 NOTE — Letter (Signed)
Summary: [facility]   Imported By: Maryln Gottron 01/28/2010 10:07:53  _____________________________________________________________________  External Attachment:    Type:   Image     Comment:   External Document

## 2010-10-15 NOTE — Progress Notes (Signed)
Summary: requesting lab results and instructions  Phone Note Call from Patient Call back at Home Phone [phone] Call back at [phone]   Caller: Patient---triage vm Reason for Call: Lab or Test Results Complaint: Urinary/GYN Problems Summary of Call: requesting lab results. was told to call back.  Initial call taken by: Warnell Forester,  August 24, 2010 8:28 AM  Follow-up for Phone Call        pt informed and to continue livalo Follow-up by: Willy Eddy, LPN,  August 24, 2010 10:15 AM

## 2010-10-15 NOTE — Assessment & Plan Note (Signed)
Summary: 4 MO ROV/MM   Vital Signs:  Patient profile:   70 year old female Height:      68 inches Weight:      170 pounds BMI:     25.94 Temp:     98.2 degrees F oral Pulse rate:   72 / minute Resp:     14 per minute BP sitting:   136 / 80 Cuff size:   regular  Vitals Entered By: Willy Eddy, LPN (November 28, 2009 8:36 AM) CC: roa labs, Hypertension Management, Lipid Management   CC:  roa labs, Hypertension Management, and Lipid Management.  History of Present Illness: the pt presents  for follow up  for HTN and lipids weigth loss planned but she has not accomplished her goals she has started exerize  Hypertension History:      She denies headache, chest pain, palpitations, dyspnea with exertion, orthopnea, PND, peripheral edema, visual symptoms, neurologic problems, syncope, and side effects from treatment.        Positive major cardiovascular risk factors include female age 87 years old or older, hyperlipidemia, and hypertension.  Negative major cardiovascular risk factors include negative family history for ischemic heart disease and non-tobacco-user status.    Lipid Management History:      Positive NCEP/ATP III risk factors include female age 69 years old or older and hypertension.  Negative NCEP/ATP III risk factors include HDL cholesterol greater than 60, no family history for ischemic heart disease, and non-tobacco-user status.      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  (ICD-715.34) 2)  Cervical Spasm  (ICD-847.0) 3)  Foot Pain, Chronic  (ICD-729.5) 4)  Uns Advrs Eff Uns Rx Medicinal&biological Sbstnc  (ICD-995.20) 5)  Allergic Rhinitis  (ICD-477.9) 6)  Hypertension  (ICD-401.9) 7)  Thalassemia Nec   (ICD-282.49) 8)  Diverticulosis, Colon  (ICD-562.10) 9)  Colonic Polyps, Hx of  (ICD-V12.72) 10)  Osteopenia  (ICD-733.90) 11)  Palpitations  (ICD-785.1) 12)  Osteoporosis  (ICD-733.00) 13)  Rheumatoid Arthritis  (ICD-714.0) 14)  Hyperlipidemia  (ICD-272.4)  Current Problems (verified): 1)  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 Once Daily 2)  Nasonex 50 Mcg/act  Susp (Mometasone Furoate) .... As Needed 3)  Astelin 137 Mcg/spray  Soln (Azelastine Hcl) .... As Needed 4)  Methotrexate 2.5 Mg  Tabs (Methotrexate Sodium) .... 6 Tabs Per Week 5)  Multivitamins   Caps (Multiple Vitamin) .... Once Daily 6)  Tylenol 325 Mg  Tabs (Acetaminophen) .... As Needed 7)  Aciphex 20 Mg  Tbec (Rabeprazole Sodium) .... As Needed 8)  Vitamin D3 1000 Unit Caps (Cholecalciferol) .Marland KitchenMarland KitchenMarland Kitchen  1 Two Times A Day 9)  Caltrate 600+d Plus 600-400 Mg-Unit Tabs (Calcium Carbonate-Vit D-Min) .... 2 Once Daily 10)  Viactiv 500-100-40  Chew (Calcium-Vitamin D-Vitamin K) .... Once Daily 11)  Vitamin B-12 1000 Mcg  Tabs (Cyanocobalamin) .... Once Daily 12)  Folic Acid 1 Mg  Tabs (Folic Acid) .... One By Mouth Daily 13)  Fish Oil 1600 Mg/.1 Tsp .Marland Kitchen.. 1 Tsp Two Times A Day 14)  Vagifem 10 Mcg Tabs (Estradiol) .Marland Kitchen.. 1 Per Week 15)  Patanol 0.1 % Soln (Olopatadine Hcl) .... One Drop  Left Eye  Bid 16)  Ciprofloxacin Hcl 500 Mg Tabs (Ciprofloxacin Hcl) .Marland Kitchen.. 1 Two  Times A Day For 7 Days 17)  Flexeril 10 Mg Tabs (Cyclobenzaprine Hcl) .... One By Mouth Tid 18)  Indomethacin 25 Mg Caps (Indomethacin) .... One By Mouth Three Times A Day With Meals X 5 Days.  Current Medications (verified): 1)  Aspirin 81 Mg  Tbec (Aspirin) .... Once Daily 2)  Nasonex 50 Mcg/act  Susp (Mometasone Furoate) .... As Needed 3)  Astelin 137 Mcg/spray  Soln (Azelastine Hcl) .... As Needed 4)  Methotrexate 2.5 Mg  Tabs (Methotrexate Sodium) .... 6 Tabs Per Week 5)  Multivitamins   Caps (Multiple Vitamin) .... Once Daily 6)  Tylenol 325 Mg  Tabs (Acetaminophen) .... As Needed 7)  Aciphex 20 Mg  Tbec (Rabeprazole Sodium) .... As Needed 8)  Vitamin D3 1000 Unit Caps (Cholecalciferol) .Marland Kitchen.. 1 Two Times A Day 9)  Caltrate 600+d Plus 600-400 Mg-Unit Tabs (Calcium Carbonate-Vit D-Min) .... 2 Once Daily 10)  Viactiv 500-100-40  Chew (Calcium-Vitamin D-Vitamin K) .... Once Daily 11)  Vitamin B-12 1000 Mcg  Tabs (Cyanocobalamin) .... Once Daily 12)  Folic Acid 1 Mg  Tabs (Folic Acid) .... One By Mouth Daily 13)  Fish Oil 1600 Mg/.1 Tsp .Marland Kitchen.. 1 Tsp Two Times A Day 14)  Vagifem 10 Mcg Tabs (Estradiol) .Marland Kitchen.. 1 Per Week 15)  Ocusoft Lid Scrub  Pads (Eyelid Cleansers) .... Two Times A Day 16)  Doxycycline Hyclate 100 Mg Solr (Doxycycline Hyclate) .Marland Kitchen.. 1 Once Daily As Needed 17)  Azasite 1 % Soln (Azithromycin) .Marland Kitchen.. 1 Once Daily As Needed  Allergies (verified): 1)  ! Vagisil (Benzocaine-Resorcinol)  Past History:  Family History: Last updated: 07/10/2007 mother dementia father killed early Family History Hypertension  Social History: Last updated: 07/10/2007 Married Former Smoker Alcohol use-yes Drug use-no  Risk Factors: Alcohol Use: 1 (11/28/2009) Caffeine Use: 2 (07/10/2007)  Risk Factors: Smoking Status: quit (11/28/2009) Packs/Day: 0.25 (11/28/2009) Passive Smoke Exposure: no (11/28/2009)  Past medical, surgical, family and social histories (including risk factors)  reviewed, and no changes noted (except as noted below).  Past Medical History: Reviewed history from 12/26/2007 and no changes required. Hyperlipidemia Rheumatoid arthritis Osteopenia thalium palpitations Colonic polyps, hx of Diverticulosis, colon Hypertension Allergic rhinitis  Past Surgical History: Reviewed history from 07/10/2007 and no changes required. Colon polypectomy Cholecystectomy Hysterectomy  Family History: Reviewed history from 07/10/2007 and no changes required. mother dementia father killed early Family History Hypertension  Social History: Reviewed history from 07/10/2007 and no changes required. Married Former Smoker Alcohol use-yes Drug use-no  Review of Systems  The patient denies anorexia, fever, weight loss, weight gain, vision loss, decreased hearing, hoarseness, chest pain, syncope, dyspnea on exertion, peripheral edema, prolonged cough, headaches, hemoptysis, abdominal pain, melena, hematochezia, severe indigestion/heartburn, hematuria, incontinence, genital sores, muscle weakness, suspicious skin lesions, transient blindness, difficulty walking, depression, unusual weight change, abnormal bleeding, enlarged lymph nodes, angioedema, and  breast masses.    Physical Exam  General:  Well-developed,well-nourished,in no acute distress; alert,appropriate and cooperative throughout examination Head:  Normocephalic and atraumatic without obvious abnormalities. No apparent alopecia or balding. Eyes:  pupils equal and pupils round.   Ears:  R ear normal and L ear normal.   Neck:  No deformities, masses, or tenderness noted. Lungs:  Normal respiratory effort, chest expands symmetrically. Lungs are clear to auscultation, no crackles or wheezes. Heart:  Normal rate and regular rhythm. S1 and S2 normal without gallop, murmur, click, rub or other extra sounds. Abdomen:  Bowel sounds positive,abdomen soft and non-tender without masses, organomegaly or hernias  noted. Extremities:  No clubbing, cyanosis, edema, or deformity noted with normal full range of motion of all joints.   Neurologic:  alert & oriented X3 and finger-to-nose normal.     Impression & Recommendations:  Problem # 1:  HYPERLIPIDEMIA (ICD-272.4) Assessment Unchanged not exericizing but has been tryiung to wath[facility] food and has lost weight Labs Reviewed: SGOT: 26 (11/21/2009)   SGPT: 27 (11/21/2009)  Lipid Goals: Chol Goal: 200 (10/09/2007)   HDL Goal: 40 (10/09/2007)   LDL Goal: 130 (12/26/2007)   TG Goal: 150 (10/09/2007)  Prior 10 Yr Risk Heart Disease: Not enough information (10/09/2007)   HDL:62.60 (11/21/2009), 56.70 (07/23/2009)  LDL:DEL (06/27/2008), DEL (10/09/2007)  Chol:228 (11/21/2009), 225 (07/23/2009)  Trig:86.0 (11/21/2009), 57.0 (07/23/2009)  Problem # 2:  HYPERTENSION (ICD-401.9) Assessment: Unchanged  BP today: 136/80 Prior BP: 130/80 (07/30/2009)  Prior 10 Yr Risk Heart Disease: Not enough information (10/09/2007)  Labs Reviewed: K+: 3.6 (07/23/2009) Creat: : 0.7 (07/23/2009)   Chol: 228 (11/21/2009)   HDL: 62.60 (11/21/2009)   LDL: DEL (06/27/2008)   TG: 86.0 (11/21/2009)  Problem # 3:  THALASSEMIA NEC (ICD-282.49)  Problem # 4:  RHEUMATOID ARTHRITIS (ICD-714.0) stable  Complete Medication List: 1)  Aspirin 81 Mg Tbec (Aspirin) .... Once daily 2)  Nasonex 50 Mcg/act Susp (Mometasone furoate) .... As needed 3)  Astelin 137 Mcg/spray Soln (Azelastine hcl) .... As needed 4)  Methotrexate 2.5 Mg Tabs (Methotrexate sodium) .... 6 tabs per week 5)  Multivitamins Caps (Multiple vitamin) .... Once daily 6)  Tylenol 325 Mg Tabs (Acetaminophen) .... As needed 7)  Aciphex 20 Mg Tbec (Rabeprazole sodium) .... As needed 8)  Vitamin D3 1000 Unit Caps (Cholecalciferol) .Marland Kitchen.. 1 two times a day 9)  Caltrate 600+d Plus 600-400 Mg-unit Tabs (Calcium carbonate-vit d-min) .... 2 once daily 10)  Viactiv 500-100-40 Chew (Calcium-vitamin d-vitamin k) .... Once  daily 11)  Vitamin B-12 1000 Mcg Tabs (Cyanocobalamin) .... Once daily 12)  Folic Acid 1 Mg Tabs (Folic acid) .... One by mouth daily 13)  Fish Oil 1600 Mg/.1 Tsp  .Marland Kitchen.. 1 tsp two times a day 14)  Vagifem 10 Mcg Tabs (Estradiol) .Marland Kitchen.. 1 per week 15)  Ocusoft Lid Scrub Pads (Eyelid cleansers) .... Two times a day 16)  Doxycycline Hyclate 100 Mg Solr (Doxycycline hyclate) .Marland Kitchen.. 1 once daily as needed 17)  Azasite 1 % Soln (Azithromycin) .Marland Kitchen.. 1 once daily as needed  Hypertension Assessment/Plan:      The patient's hypertensive risk group is category B: At least one risk factor (excluding diabetes) with no target organ damage.  Today's blood pressure is 136/80.  Her blood pressure goal is < 140/90.  Lipid Assessment/Plan:      Based on NCEP/ATP III, the patient's risk factor category is "0-1 risk factors".  The patient's lipid goals are as follows: Total cholesterol goal is 200;  LDL cholesterol goal is 160; HDL cholesterol goal is 40; Triglyceride goal is 150.    Patient Instructions: 1)  Please schedule a follow-up appointment in 4 months. 2)  if the improvement continues .... no meds but if it is static or declines will try pulse therapy 3)  Hepatic Panel prior to visit, ICD-9:992.50 4)  Lipid Panel prior to visit, ICD-9:272.4

## 2010-10-16 NOTE — Letter (Signed)
Summary: [facility]   Imported By: Lanelle Bal 10/06/2009 08:23:28  _____________________________________________________________________  External Attachment:    Type:   Image     Comment:   External Document

## 2010-10-16 NOTE — Letter (Signed)
Summary: [facility]   Imported By: Sherian Rein 10/08/2009 08:50:14  _____________________________________________________________________  External Attachment:    Type:   Image     Comment:   External Document

## 2010-11-20 ENCOUNTER — Ambulatory Visit (INDEPENDENT_AMBULATORY_CARE_PROVIDER_SITE_OTHER): Payer: Medicare Other | Admitting: Family Medicine

## 2010-11-20 ENCOUNTER — Encounter: Payer: Self-pay | Admitting: Family Medicine

## 2010-11-20 ENCOUNTER — Telehealth: Payer: Self-pay | Admitting: Internal Medicine

## 2010-11-20 VITALS — BP 150/70 | Temp 98.4°F | Ht 67.0 in | Wt 166.0 lb

## 2010-11-20 DIAGNOSIS — IMO0002 Reserved for concepts with insufficient information to code with codable children: Secondary | ICD-10-CM

## 2010-11-20 DIAGNOSIS — S76219A Strain of adductor muscle, fascia and tendon of unspecified thigh, initial encounter: Secondary | ICD-10-CM

## 2010-11-20 MED ORDER — HYDROCODONE-ACETAMINOPHEN 5-325 MG PO TABS: 1.0000 | ORAL_TABLET | Freq: Four times a day (QID) | ORAL | Status: AC | PRN

## 2010-11-20 NOTE — Progress Notes (Signed)
  Subjective:    Patient ID: Bonnie [name], female    DOB: 08/06/1941, 70 y.o.   MRN: [id number]  HPI  Patient seen with right groin pain. Onset 2 days ago. Possible strain when getting out of car. Pain first noted Wednesday night. Pain is somewhat poorly localized right groin region. Has some pain at rest but worse with walking and change of position. Tylenol with some relief. Pain radiates anterior thigh toward the knee. No back pain. Pain severity 10 out of 10 at its worst.    patient has history of rheumatoid arthritis. Denies any fever, chills, dysuria , or rash. No numbness or weakness right lower extremity   Review of Systems  Constitutional: Negative for fever, chills, activity change, appetite change and fatigue.  Cardiovascular: Negative for chest pain and leg swelling.  Genitourinary: Negative for dysuria, hematuria and flank pain.  Musculoskeletal: Negative for back pain, joint swelling and arthralgias.  Skin: Negative for rash.  Neurological: Negative for weakness.  Hematological: Negative for adenopathy.       Objective:   Physical Exam  Constitutional: She is oriented to person, place, and time. She appears well-developed and well-nourished.  Neck: Normal range of motion. Neck supple.  Cardiovascular: Normal rate, regular rhythm and normal heart sounds.   Pulmonary/Chest: Effort normal and breath sounds normal.  Musculoskeletal: She exhibits no edema.  Neurological: She is alert and oriented to person, place, and time. She displays normal reflexes.  Skin: No rash noted.   right lower extremity exam reveals no edema. No calf tenderness. Patient has pain with hip flexion against resistance. She has some proximal adductor muscle tenderness. No pain with knee extension against resistance. Full range of motion with internal and external rotation right hip  Neuro full-strength lower extremity. Deep tender reflexes 2+ and symmetric. Normal sensory function.         Assessment & Plan:   right groin strain. Hydrocodone for pain supplementation. Heat for symptomatic relief.   Touch base next week if not improving

## 2010-11-20 NOTE — Telephone Encounter (Signed)
Hold the livalo or a week if the pain stops quickly it was the med if not it is arthritis and resume the medication

## 2010-11-20 NOTE — Telephone Encounter (Signed)
Pt states she has ov with dr Caryl Never this pm

## 2010-11-20 NOTE — Patient Instructions (Signed)
Continue heat or ice for symptomatic relief. Follow up with Dr Lovell Sheehan in 2 weeks if no better.

## 2010-11-20 NOTE — Telephone Encounter (Signed)
Triage vm------experiencing muscle pain down her leg. Do not know if it is her Livalo. She took Tylenol. Please advise.

## 2010-11-20 NOTE — Telephone Encounter (Signed)
Please advise 

## 2010-11-22 ENCOUNTER — Encounter: Payer: Self-pay | Admitting: Family Medicine

## 2010-12-15 ENCOUNTER — Other Ambulatory Visit (INDEPENDENT_AMBULATORY_CARE_PROVIDER_SITE_OTHER): Payer: Medicare Other

## 2010-12-15 DIAGNOSIS — T887XXA Unspecified adverse effect of drug or medicament, initial encounter: Secondary | ICD-10-CM

## 2010-12-15 DIAGNOSIS — E785 Hyperlipidemia, unspecified: Secondary | ICD-10-CM

## 2010-12-16 LAB — LIPID PANEL
Cholesterol: 195 mg/dL (ref 0–200)
HDL: 61.3 mg/dL (ref >39.00)
LDL Cholesterol: 124 mg/dL — ABNORMAL HIGH (ref 0–99)
Total CHOL/HDL Ratio: 3

## 2010-12-16 LAB — HEPATIC FUNCTION PANEL
Albumin: 3.7 g/dL (ref 3.5–5.2)
Alkaline Phosphatase: 46 U/L (ref 39–117)
Total Bilirubin: 0.7 mg/dL (ref 0.3–1.2)
Total Protein: 6.4 g/dL (ref 6.0–8.3)

## 2010-12-21 ENCOUNTER — Ambulatory Visit: Payer: Self-pay | Admitting: Internal Medicine

## 2011-01-06 ENCOUNTER — Ambulatory Visit (INDEPENDENT_AMBULATORY_CARE_PROVIDER_SITE_OTHER): Payer: Medicare Other | Admitting: Internal Medicine

## 2011-01-06 ENCOUNTER — Encounter: Payer: Self-pay | Admitting: Internal Medicine

## 2011-01-06 VITALS — BP 122/74 | HR 56 | Temp 98.2°F | Resp 14 | Ht 67.0 in | Wt 164.0 lb

## 2011-01-06 DIAGNOSIS — E041 Nontoxic single thyroid nodule: Secondary | ICD-10-CM

## 2011-01-06 NOTE — Progress Notes (Signed)
Subjective:    Patient ID: Bonnie [name], female    DOB: [birth year], 70 y.o.   MRN: [id number]  HPI Patient has noticed increased episodes of muscle strains in different locations these appear to be unrelated and self-limited but occur with minimal activity. She is followed for rheumatoid arthritis and hyperlipidemia she is currently not on steroids. She is on the methotrexate. She had a recent episode of mouth ulcers which was associated about the same time of the muscle strains She was treated with nystatin and the symptoms resolved she presents today for routine monitoring of her hyperlipidemia with excellent results her liver functions are normal and her cholesterol is at goal he appeared Her white count was suppressed at 3.9 approximately at this time with a monocytosis her liver functions were normal her renal functions were normal I would suggest that this event was related to the use of methotrexate   Review of Systems  Constitutional: Negative for activity change, appetite change and fatigue.  HENT: Negative for ear pain, congestion, neck pain, postnasal drip and sinus pressure.   Eyes: Negative for redness and visual disturbance.  Respiratory: Negative for cough, shortness of breath and wheezing.   Gastrointestinal: Negative for abdominal pain and abdominal distention.  Genitourinary: Negative for dysuria, frequency and menstrual problem.  Musculoskeletal: Negative for myalgias, joint swelling and arthralgias.  Skin: Negative for rash and wound.  Neurological: Negative for dizziness, weakness and headaches.  Hematological: Negative for adenopathy. Does not bruise/bleed easily.  Psychiatric/Behavioral: Negative for sleep disturbance and decreased concentration.   History reviewed. No pertinent past medical history. History reviewed. No pertinent past surgical history.  reports that she quit smoking about 37 years ago. Her smoking use included Cigarettes. She has a 5 pack-year  smoking history. She does not have any smokeless tobacco history on file. She reports that she does not drink alcohol or use illicit drugs. family history is not on file. Allergies  Allergen Reactions  . Bicozene     REACTION: burning        Objective:   Physical Exam  Constitutional: She is oriented to person, place, and time. She appears well-developed and well-nourished. No distress.  HENT:  Head: Normocephalic and atraumatic.  Right Ear: External ear normal.  Left Ear: External ear normal.  Nose: Nose normal.  Mouth/Throat: Oropharynx is clear and moist.  Eyes: Conjunctivae and EOM are normal. Pupils are equal, round, and reactive to light.  Neck: Normal range of motion. Neck supple. No JVD present. No tracheal deviation present. No thyromegaly present.  Cardiovascular: Normal rate, regular rhythm, normal heart sounds and intact distal pulses.   No murmur heard. Pulmonary/Chest: Effort normal and breath sounds normal. She has no wheezes. She exhibits no tenderness.  Abdominal: Soft. Bowel sounds are normal.  Musculoskeletal: Normal range of motion. She exhibits no edema and no tenderness.  Lymphadenopathy:    She has no cervical adenopathy.  Neurological: She is alert and oriented to person, place, and time. She has normal reflexes. No cranial nerve deficit.  Skin: Skin is warm and dry. She is not diaphoretic.  Psychiatric: She has a normal mood and affect. Her behavior is normal.          Assessment & Plan:  The patient's cholesterol is at goal We reviewed the results of her recent laboratory values we also reviewed the events of the tendinitis and the oral thrush and their relationship to the methotrexate  The patient had a single thyroid nodule on ultrasound screening  for vascular disease personally or vascular screen was completely negative unfortunately they did see a single nodule that we will need to further elucidate.

## 2011-01-08 ENCOUNTER — Ambulatory Visit
Admission: RE | Admit: 2011-01-08 | Discharge: 2011-01-08 | Disposition: A | Discharge status: Home / Self Care | Payer: Medicare Other | Source: Ambulatory Visit | Attending: Internal Medicine | Admitting: Internal Medicine

## 2011-01-08 DIAGNOSIS — E041 Nontoxic single thyroid nodule: Secondary | ICD-10-CM

## 2011-01-13 ENCOUNTER — Other Ambulatory Visit: Payer: Self-pay | Admitting: *Deleted

## 2011-01-13 DIAGNOSIS — E041 Nontoxic single thyroid nodule: Secondary | ICD-10-CM

## 2011-01-18 ENCOUNTER — Other Ambulatory Visit: Payer: Self-pay | Admitting: Internal Medicine

## 2011-01-18 DIAGNOSIS — E041 Nontoxic single thyroid nodule: Secondary | ICD-10-CM

## 2011-01-23 ENCOUNTER — Other Ambulatory Visit: Payer: Self-pay | Admitting: Internal Medicine

## 2011-01-26 ENCOUNTER — Other Ambulatory Visit (HOSPITAL_COMMUNITY)
Admission: RE | Admit: 2011-01-26 | Discharge: 2011-01-26 | Disposition: A | Discharge status: Home / Self Care | Payer: Medicare Other | Source: Ambulatory Visit | Attending: Diagnostic Radiology | Admitting: Diagnostic Radiology

## 2011-01-26 ENCOUNTER — Other Ambulatory Visit: Payer: Self-pay | Admitting: Diagnostic Radiology

## 2011-01-26 ENCOUNTER — Ambulatory Visit
Admission: RE | Admit: 2011-01-26 | Discharge: 2011-01-26 | Disposition: A | Discharge status: Home / Self Care | Payer: Medicare Other | Source: Ambulatory Visit | Attending: Internal Medicine | Admitting: Internal Medicine

## 2011-01-26 DIAGNOSIS — E041 Nontoxic single thyroid nodule: Secondary | ICD-10-CM

## 2011-01-26 DIAGNOSIS — E049 Nontoxic goiter, unspecified: Secondary | ICD-10-CM | POA: Insufficient documentation

## 2011-01-29 ENCOUNTER — Other Ambulatory Visit: Payer: Self-pay | Admitting: *Deleted

## 2011-01-29 DIAGNOSIS — C73 Malignant neoplasm of thyroid gland: Secondary | ICD-10-CM

## 2011-01-29 NOTE — Assessment & Plan Note (Signed)
[facility]                               PULMONARY OFFICE NOTE   HARMONII, KARLE                     MRN:          [id number]  DATE:04/11/2006                            DOB:          1941/04/10    PROBLEM LIST:  1.  Nasal congestion.  2.  Rheumatoid arthritis.   HISTORY:  She was already scheduled for allergy skin testing today but comes  in saying that her rheumatologist wants her to start methotrexate and wants  her to get her allergy skin testing done first.  She had two rather atypical  episodes of nasal congestion as described at her first visit with question  of whether these represented viral upper respiratory illnesses.   MEDICATIONS:  1.  Avapro 75 mg one half.  2.  Folic acid.  3.  Baby aspirin.  4.  Nasonex.  5.  Astelin.  6.  Methotrexate to start at 10 mg.  7.  Prednisone to start at 10 mg daily.   DRUG INTOLERANCE:  LIPITOR, VAGISIL, and MONISTAT.   OBJECTIVE:  VITAL SIGNS:  Weight 168 pounds by patient report.  Blood  pressure 110/68, pulse 55, room air saturation 98%.  HEENT:  Breathing unlabored.  No obvious nasal congestion.  Conjunctivae not  injected.  PULSE:  Regular without murmur.  SKIN TESTING:  Positive histamine, negative diluent controls.  She had trace  positive intradermal reactions only, to dust mites and some molds.  The  pattern is considered insignificant.   IMPRESSION:  1.  Episodes of nasal congestion, not repetitive enough or clearly enough      associated with specific triggers to be obviously allergic.  2.  Allergy skin tests with only a few minor reactions, of doubtful clinical      significance.   PLAN:  I have explained that I doubt her episodes were IGE allergy mediated.  She will follow with Dr. Jimmy Footman for her rheumatology care.  I will see  her back p.r.n.                                   Clinton D. Maple Hudson, MD, [facility], FACP   CDY/MedQ  DD:  04/11/2006  DT:  04/11/2006  Job  #:  [id number]   cc:   Stacie Glaze, MD  Lemmie Evens, MD

## 2011-02-10 ENCOUNTER — Other Ambulatory Visit (INDEPENDENT_AMBULATORY_CARE_PROVIDER_SITE_OTHER): Payer: Self-pay | Admitting: Surgery

## 2011-02-10 ENCOUNTER — Ambulatory Visit (HOSPITAL_COMMUNITY)
Admission: RE | Admit: 2011-02-10 | Discharge: 2011-02-10 | Disposition: A | Discharge status: Home / Self Care | Payer: Medicare Other | Source: Ambulatory Visit | Attending: Surgery | Admitting: Surgery

## 2011-02-10 ENCOUNTER — Encounter (HOSPITAL_COMMUNITY): Payer: Medicare Other

## 2011-02-10 DIAGNOSIS — Z01811 Encounter for preprocedural respiratory examination: Secondary | ICD-10-CM | POA: Insufficient documentation

## 2011-02-10 DIAGNOSIS — Z01818 Encounter for other preprocedural examination: Secondary | ICD-10-CM

## 2011-02-10 DIAGNOSIS — Z01812 Encounter for preprocedural laboratory examination: Secondary | ICD-10-CM | POA: Insufficient documentation

## 2011-02-10 LAB — DIFFERENTIAL
Lymphocytes Relative: 28 % (ref 12–46)
Lymphs Abs: 1.9 10*3/uL (ref 0.7–4.0)
Neutro Abs: 4.2 10*3/uL (ref 1.7–7.7)
Neutrophils Relative %: 61 % (ref 43–77)

## 2011-02-10 LAB — CBC
Platelets: 299 10*3/uL (ref 150–400)
RBC: 4.21 MIL/uL (ref 3.87–5.11)
WBC: 6.9 10*3/uL (ref 4.0–10.5)

## 2011-02-10 LAB — BASIC METABOLIC PANEL
BUN: 20 mg/dL (ref 6–23)
GFR calc non Af Amer: >60 mL/min (ref >60)
Potassium: 4.5 mEq/L (ref 3.5–5.1)
Sodium: 140 mEq/L (ref 135–145)

## 2011-02-10 LAB — URINALYSIS, ROUTINE W REFLEX MICROSCOPIC
Nitrite: NEGATIVE
Specific Gravity, Urine: 1.014 (ref 1.005–1.030)
Urobilinogen, UA: 0.2 mg/dL (ref 0.0–1.0)

## 2011-02-10 LAB — PROTIME-INR
INR: 0.96 (ref 0.00–1.49)
Prothrombin Time: 13 seconds (ref 11.6–15.2)

## 2011-02-10 LAB — SURGICAL PCR SCREEN: Staphylococcus aureus: NEGATIVE

## 2011-02-11 ENCOUNTER — Encounter (HOSPITAL_COMMUNITY): Payer: Medicare Other

## 2011-02-11 ENCOUNTER — Telehealth: Payer: Self-pay | Admitting: *Deleted

## 2011-02-11 NOTE — Telephone Encounter (Signed)
Pt wants Dr. Lovell Sheehan to know she is having a thyroidectomy this week.

## 2011-02-11 NOTE — Telephone Encounter (Signed)
Dr jenkins informed 

## 2011-02-12 ENCOUNTER — Ambulatory Visit (HOSPITAL_COMMUNITY)
Admission: RE | Admit: 2011-02-12 | Discharge: 2011-02-13 | Disposition: A | Discharge status: Home / Self Care | Payer: Medicare Other | Source: Ambulatory Visit | Attending: Surgery | Admitting: Surgery

## 2011-02-12 ENCOUNTER — Other Ambulatory Visit (INDEPENDENT_AMBULATORY_CARE_PROVIDER_SITE_OTHER): Payer: Self-pay | Admitting: Surgery

## 2011-02-12 DIAGNOSIS — Z7982 Long term (current) use of aspirin: Secondary | ICD-10-CM | POA: Insufficient documentation

## 2011-02-12 DIAGNOSIS — E069 Thyroiditis, unspecified: Secondary | ICD-10-CM | POA: Insufficient documentation

## 2011-02-12 DIAGNOSIS — Z9089 Acquired absence of other organs: Secondary | ICD-10-CM | POA: Insufficient documentation

## 2011-02-12 DIAGNOSIS — M069 Rheumatoid arthritis, unspecified: Secondary | ICD-10-CM | POA: Insufficient documentation

## 2011-02-12 DIAGNOSIS — E042 Nontoxic multinodular goiter: Secondary | ICD-10-CM | POA: Insufficient documentation

## 2011-02-12 DIAGNOSIS — Z90710 Acquired absence of both cervix and uterus: Secondary | ICD-10-CM | POA: Insufficient documentation

## 2011-02-12 DIAGNOSIS — Z79899 Other long term (current) drug therapy: Secondary | ICD-10-CM | POA: Insufficient documentation

## 2011-02-12 DIAGNOSIS — D563 Thalassemia minor: Secondary | ICD-10-CM | POA: Insufficient documentation

## 2011-02-12 LAB — CALCIUM: Calcium: 8.2 mg/dL — ABNORMAL LOW (ref 8.4–10.5)

## 2011-02-13 LAB — CALCIUM: Calcium: 8.4 mg/dL (ref 8.4–10.5)

## 2011-02-19 ENCOUNTER — Other Ambulatory Visit (INDEPENDENT_AMBULATORY_CARE_PROVIDER_SITE_OTHER): Payer: Self-pay | Admitting: Surgery

## 2011-03-01 NOTE — Op Note (Signed)
Bonnie [name], [name]              ACCOUNT NO.:  [id number]  MEDICAL RECORD NO.:  [id number]           PATIENT TYPE:  O  LOCATION:  1540                         FACILITY:  [facility]  PHYSICIAN:  Velora Heckler, MD      DATE OF BIRTH:  [birth year]  DATE OF PROCEDURE:  02/12/2011                               OPERATIVE REPORT   PREOPERATIVE DIAGNOSIS:  Multinodular thyroid with cytologic atypia, worrisome for malignancy.  POSTOPERATIVE DIAGNOSIS:  Multinodular thyroid with cytologic atypia, worrisome for malignancy.  PROCEDURE:  Total thyroidectomy.  SURGEON:  Velora Heckler, MD, FACS  ANESTHESIA:  General per Jenelle Mages. Fortune, MD  ESTIMATED BLOOD LOSS:  Minimal.  PREPARATION:  ChloraPrep.  COMPLICATIONS:  None.  INDICATIONS:  The patient is a 70 year old white female from [place], [place].  The patient has had a screening carotid duplex evaluation in January 2012.  The patient was noted incidentally to have a thyroid nodule.  She demonstrated this to her primary care physician, Dr. Darryll Capers, and was referred for thyroid ultrasound.  This showed a multinodular thyroid with a dominant right 1.5 cm nodule.  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  The patient now comes to surgery for resection for definitive diagnosis.  BODY OF REPORT:  Procedure was done in OR #1 at the [facility].  The patient was brought to the operating room, placed in supine position on the operating room table.  Following administration of general anesthesia, the patient was positioned and then prepped and draped in usual strict aseptic fashion.  After ascertaining that an adequate level of anesthesia been achieved, a Kocher incision was made with a #15 blade.  Dissection was carried through subcutaneous tissues and  platysma.  Hemostasis was obtained with electrocautery.  Skin flaps were elevated cephalad and caudad from the thyroid notch to the sternal notch.  A Mahorner self-retaining retractor was placed for exposure.  Strap muscles were incised in the midline. Dissection was began on the left side.  Left thyroid lobe was gently dissected out.  It was quite small.  It contained small nodules.  Middle thyroid vein was divided between Ligaclips with the Harmonic scalpel. Superior pole vessels were dissected out and divided individually between Ligaclips with the Harmonic scalpel.  Inferior venous tributaries were divided between Ligaclips with the Harmonic scalpel. Gland was rolled anteriorly.  Branches of the inferior thyroid artery were divided between small Ligaclips with the Harmonic scalpel. Parathyroid tissue was identified and preserved.  The recurrent laryngeal nerve was identified and preserved.  Ligament of Allyson Sabal was released and the gland was mobilized up and onto the anterior trachea. Isthmus was mobilized across the midline.  There was no evidence of any significant pyramidal lobe.  Next, we turned our attention to the right side.  Again strap muscles were reflected laterally.  The right lobe was slightly larger.  It was multinodular.  Superior pole vessels were divided between small and medium Ligaclips with the Harmonic scalpel.  Parathyroid tissue was identified  and preserved.  Branches of the inferior thyroid artery were divided between small Ligaclips with the Harmonic scalpel.  Recurrent nerve was identified and preserved.  A prominent tubercle of Zuckerkandl was also dissected out and resected with the right thyroid lobe.  Venous tributaries on the right inferior pole were divided between Ligaclips with the Harmonic scalpel preserving adjacent parathyroid tissue. Ligament of Allyson Sabal was released and the gland was mobilized onto the anterior trachea from which it was completely  excised with the electrocautery.  Sutures used to mark the right superior pole.  The entire thyroid gland was submitted to Pathology for review.  Neck was irrigated with warm saline.  Good hemostasis was achieved. Surgicel was placed in the operative field.  Strap muscles were reapproximated in the midline with interrupted 3-0 Vicryl sutures. Platysma was closed with interrupted 3-0 Vicryl sutures.  Skin was closed with a running 4-0 Monocryl subcuticular suture.  Wound was washed and dried and benzoin and Steri-Strips were applied.  Sterile dressings were applied.  The patient was awakened from anesthesia and brought to the recovery room.  The patient tolerated the procedure well.   Velora Heckler, MD, FACS     TMG/MEDQ  D:  02/12/2011  T:  02/12/2011  Job:  [id number]  cc:   Stacie Glaze, MD [address] [place] [address]  Electronically Signed by Darnell Level MD on 03/01/2011 09:18:01 AM

## 2011-03-01 NOTE — Discharge Summary (Signed)
  NAMELAJOYCE, Bonnie              ACCOUNT NO.:  [id number]  MEDICAL RECORD NO.:  [id number]           PATIENT TYPE:  O  LOCATION:  1540                         FACILITY:  [facility]  PHYSICIAN:  Velora Heckler, MD      DATE OF BIRTH:  1941-05-24  DATE OF ADMISSION:  02/12/2011 DATE OF DISCHARGE:  02/13/2011                              DISCHARGE SUMMARY   REASON FOR ADMISSION:  Thyroid nodules with cytologic atypia.  BRIEF HISTORY:  The patient is 70 year old white female from [place], [place].  She was found incidentally on carotid duplex scanning to have thyroid nodules.  She underwent thyroid biopsy with ultrasound guidance.  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 The patient now comes to Surgery for thyroidectomy for definitive diagnosis.  HOSPITAL COURSE:  The patient was admitted on February 12, 2011, to [facility].  She underwent total thyroidectomy.  Postoperative course was uncomplicated.  She did well.  Calcium levels remained stable.  She was prepared for discharge home on the first postoperative day.  DISCHARGE/PLAN:  The patient is discharged home on February 13, 2011 in good condition, tolerating a regular diet, and ambulating independently.  DISCHARGE MEDICATIONS:  Discharge medications include calcium supplements, Synthroid 100 mcg daily, and Vicodin as needed for pain.  The patient will return to see me at [facility] in 2-3 weeks.  We will check a calcium level prior to that office visit.  FINAL DIAGNOSIS:  Thyroid nodule with cytologic atypia, final pathologic results pending at the time of discharge.  CONDITION AT DISCHARGE:  Good.     Velora Heckler, MD     TMG/MEDQ  D:  02/13/2011  T:  02/13/2011  Job:  [id number]  cc:   Stacie Glaze, MD [address] [place] [address]  Electronically Signed by Darnell Level MD on  03/01/2011 09:18:06 AM

## 2011-04-07 ENCOUNTER — Other Ambulatory Visit (INDEPENDENT_AMBULATORY_CARE_PROVIDER_SITE_OTHER): Payer: Self-pay | Admitting: Surgery

## 2011-04-09 ENCOUNTER — Other Ambulatory Visit (INDEPENDENT_AMBULATORY_CARE_PROVIDER_SITE_OTHER): Payer: Self-pay | Admitting: Surgery

## 2011-04-12 ENCOUNTER — Encounter (INDEPENDENT_AMBULATORY_CARE_PROVIDER_SITE_OTHER): Payer: Medicare Other | Admitting: Surgery

## 2011-04-14 MED ORDER — SYNTHROID 100 MCG PO TABS: 100.0000 ug | ORAL_TABLET | Freq: Every day | ORAL | Status: DC

## 2011-04-14 NOTE — Telephone Encounter (Signed)
OK to refill for 3 months. TMG

## 2011-04-27 ENCOUNTER — Ambulatory Visit (INDEPENDENT_AMBULATORY_CARE_PROVIDER_SITE_OTHER): Payer: Medicare Other | Admitting: Surgery

## 2011-04-27 ENCOUNTER — Encounter (INDEPENDENT_AMBULATORY_CARE_PROVIDER_SITE_OTHER): Payer: Self-pay | Admitting: Surgery

## 2011-04-27 VITALS — Temp 97.4°F

## 2011-04-27 DIAGNOSIS — E042 Nontoxic multinodular goiter: Secondary | ICD-10-CM

## 2011-04-27 MED ORDER — LEVOTHYROXINE SODIUM 100 MCG PO TABS: 100.0000 ug | ORAL_TABLET | Freq: Every day | ORAL | Status: DC

## 2011-04-27 NOTE — Progress Notes (Signed)
Visit Diagnoses: 1. Multinodular goiter (nontoxic)     HISTORY: Patient is a 70 year old female who underwent total thyroidectomy on February 12, 2011. Final pathology was benign. She is taking Synthroid 100 mcg daily. Laboratory studies in late July 2012 show a normal TSH of 1.413 and a normal serum calcium level of 8.9.   EXAM: Surgical wound has healed nicely with a good cosmetic result. No sign of seroma. No sign of infection. His quality is normal.   IMPRESSION: Status post total thyroidectomy with benign final pathology.   PLAN: Patient will remain on Synthroid 100 mcg daily. I have renewed her prescription for one year. She will see Dr. Darryll Capers in followup in early October. He will take over management of her thyroid hormone replacement at that time.  Patient will return to see me as needed.    Velora Heckler, MD, FACS General & Endocrine Surgery [facility]

## 2011-05-27 ENCOUNTER — Other Ambulatory Visit: Payer: Medicare Other

## 2011-06-07 ENCOUNTER — Encounter: Payer: Medicare Other | Admitting: Internal Medicine

## 2011-06-14 ENCOUNTER — Telehealth: Payer: Self-pay | Admitting: *Deleted

## 2011-06-14 ENCOUNTER — Ambulatory Visit (INDEPENDENT_AMBULATORY_CARE_PROVIDER_SITE_OTHER): Payer: Medicare Other | Admitting: Internal Medicine

## 2011-06-14 VITALS — BP 122/70 | HR 72 | Temp 98.2°F | Resp 16 | Ht 67.0 in | Wt 162.0 lb

## 2011-06-14 DIAGNOSIS — J309 Allergic rhinitis, unspecified: Secondary | ICD-10-CM

## 2011-06-14 DIAGNOSIS — Z23 Encounter for immunization: Secondary | ICD-10-CM

## 2011-06-14 DIAGNOSIS — K5730 Diverticulosis of large intestine without perforation or abscess without bleeding: Secondary | ICD-10-CM

## 2011-06-14 DIAGNOSIS — M069 Rheumatoid arthritis, unspecified: Secondary | ICD-10-CM

## 2011-06-14 DIAGNOSIS — Z0000 Encounter for general adult medical examination without abnormal findings: Secondary | ICD-10-CM

## 2011-06-14 DIAGNOSIS — D568 Other thalassemias: Secondary | ICD-10-CM

## 2011-06-14 DIAGNOSIS — T887XXA Unspecified adverse effect of drug or medicament, initial encounter: Secondary | ICD-10-CM

## 2011-06-14 DIAGNOSIS — E042 Nontoxic multinodular goiter: Secondary | ICD-10-CM

## 2011-06-14 DIAGNOSIS — E785 Hyperlipidemia, unspecified: Secondary | ICD-10-CM

## 2011-06-14 LAB — CBC WITH DIFFERENTIAL/PLATELET
Basophils Relative: 0.9 % (ref 0.0–3.0)
Eosinophils Relative: 1.6 % (ref 0.0–5.0)
HCT: 39.5 % (ref 36.0–46.0)
Hemoglobin: 13.1 g/dL (ref 12.0–15.0)
Lymphs Abs: 1.7 10*3/uL (ref 0.7–4.0)
MCV: 95.8 fl (ref 78.0–100.0)
Monocytes Absolute: 0.4 10*3/uL (ref 0.1–1.0)
Neutro Abs: 3.7 10*3/uL (ref 1.4–7.7)
RBC: 4.12 Mil/uL (ref 3.87–5.11)
WBC: 6 10*3/uL (ref 4.5–10.5)

## 2011-06-14 LAB — HEPATIC FUNCTION PANEL
ALT: 20 U/L (ref 0–35)
AST: 21 U/L (ref 0–37)
Albumin: 4.2 g/dL (ref 3.5–5.2)
Alkaline Phosphatase: 48 U/L (ref 39–117)
Total Protein: 7.5 g/dL (ref 6.0–8.3)

## 2011-06-14 LAB — BASIC METABOLIC PANEL
CO2: 27 mEq/L (ref 19–32)
Calcium: 9 mg/dL (ref 8.4–10.5)
Glucose, Bld: 94 mg/dL (ref 70–99)
Sodium: 144 mEq/L (ref 135–145)

## 2011-06-14 LAB — LIPID PANEL: HDL: 74.8 mg/dL (ref >39.00)

## 2011-06-14 LAB — LDL CHOLESTEROL, DIRECT: Direct LDL: 121.6 mg/dL

## 2011-06-14 MED ORDER — METHOTREXATE 2.5 MG PO TABS: 12.5000 mg | ORAL_TABLET | ORAL | Status: DC

## 2011-06-14 MED ORDER — LEVOTHYROXINE SODIUM 100 MCG PO TABS: 100.0000 ug | ORAL_TABLET | Freq: Every day | ORAL | Status: DC

## 2011-06-14 MED ORDER — CALCIUM CITRATE-VITAMIN D 315-200 MG-UNIT PO TABS: 1.0000 | ORAL_TABLET | Freq: Two times a day (BID) | ORAL | Status: DC

## 2011-06-14 NOTE — Patient Instructions (Signed)
Patient was instructed to continue all medications as prescribed. To stop at the checkout desk and schedule a followup appointment  

## 2011-06-14 NOTE — Telephone Encounter (Signed)
opend in error

## 2011-06-14 NOTE — Progress Notes (Signed)
Subjective:    Patient ID: Bonnie [name], female    DOB: 12-24-1940, 70 y.o.   MRN: [id number]  HPI the patient presents today for Medicare wellness examination but is also seen for followup of rheumatoid arthritis hyperlipidemia hypothyroidism and for potential side effects of these medications.  She is below 4 mg by mouth weekly for cholesterol control her medications were reviewed    Review of Systems  Constitutional: Negative for activity change, appetite change and fatigue.  HENT: Negative for ear pain, congestion, neck pain, postnasal drip and sinus pressure.   Eyes: Negative for redness and visual disturbance.  Respiratory: Negative for cough, shortness of breath and wheezing.   Gastrointestinal: Negative for abdominal pain and abdominal distention.  Genitourinary: Negative for dysuria, frequency and menstrual problem.  Musculoskeletal: Negative for myalgias, joint swelling and arthralgias.  Skin: Negative for rash and wound.  Neurological: Negative for dizziness, weakness and headaches.  Hematological: Negative for adenopathy. Does not bruise/bleed easily.  Psychiatric/Behavioral: Negative for sleep disturbance and decreased concentration.   Past Medical History  Diagnosis Date  . Hyperlipidemia   . Thyroid lump   . Night sweats   . Fatigue   . Bruises easily   . Colon polyp   . Reflux   . Hemorrhoids   . Anemia   . Wears glasses   . Sinus problem   . Arthritis     muscle weaknes at times  . Menopause     History   Social History  . Marital Status: Married    Spouse Name: N/A    Number of Children: N/A  . Years of Education: N/A   Occupational History  . Not on file.   Social History Main Topics  . Smoking status: Former Smoker -- 0.5 packs/day for 10 years    Types: Cigarettes    Quit date: 11/19/1973  . Smokeless tobacco: Not on file  . Alcohol Use: No  . Drug Use: No  . Sexually Active: Yes   Other Topics Concern  . Not on file   Social  History Narrative  . No narrative on file    Past Surgical History  Procedure Date  . Abdominal hysterectomy   . Cholecystectomy     No family history on file.  Allergies  Allergen Reactions  . Bicozene     REACTION: burning    Current Outpatient Prescriptions on File Prior to Visit  Medication Sig Dispense Refill  . acetaminophen (TYLENOL) 500 MG tablet Take 500 mg by mouth as needed.        Marland Kitchen aspirin 81 MG tablet Take 81 mg by mouth daily.        Marland Kitchen azelastine (ASTELIN) 137 MCG/SPRAY nasal spray 1 spray by Nasal route 2 (two) times daily. Use in each nostril as directed as needed       . celecoxib (CELEBREX) 200 MG capsule Take 200 mg by mouth as needed.        . Cyanocobalamin (VITAMIN B-12) 2500 MCG SUBL Place under the tongue daily.        . Estradiol (VAGIFEM) 10 MCG TABS Place vaginally. One tab per week       . fish oil-omega-3 fatty acids 1000 MG capsule Take 2 g by mouth daily.        . folic acid (FOLVITE) 1 MG tablet Take 2 mg by mouth daily.       Marland Kitchen HYDROcodone-acetaminophen (VICODIN) 5-500 MG per tablet Take 1 tablet by mouth every 6 (six)  hours as needed.       Marland Kitchen levothyroxine (SYNTHROID, LEVOTHROID) 100 MCG tablet Take 1 tablet (100 mcg total) by mouth daily.  90 tablet  3  . mometasone (NASONEX) 50 MCG/ACT nasal spray 2 sprays by Nasal route daily.        . Multiple Vitamin (MULTIVITAMIN PO) Take by mouth daily.        . Pitavastatin Calcium (LIVALO) 4 MG TABS Take by mouth. One by mouth every Monday night       . RABEprazole (ACIPHEX) 20 MG tablet Take 20 mg by mouth daily as needed.         BP 122/70  Pulse 72  Temp 98.2 F (36.8 C)  Resp 16  Ht 5\' 7"  (1.702 m)  Wt 162 lb (73.483 kg)  BMI 25.37 kg/m2       Objective:   Physical Exam  Nursing note and vitals reviewed. Constitutional: She is oriented to person, place, and time. She appears well-developed and well-nourished. No distress.  HENT:  Head: Normocephalic and atraumatic.  Right Ear:  External ear normal.  Left Ear: External ear normal.  Nose: Nose normal.  Mouth/Throat: Oropharynx is clear and moist.  Eyes: Conjunctivae and EOM are normal. Pupils are equal, round, and reactive to light.  Neck: Normal range of motion. Neck supple. No JVD present. No tracheal deviation present. No thyromegaly present.  Cardiovascular: Normal rate, regular rhythm, normal heart sounds and intact distal pulses.   No murmur heard. Pulmonary/Chest: Effort normal and breath sounds normal. She has no wheezes. She exhibits no tenderness.  Abdominal: Soft. Bowel sounds are normal.  Musculoskeletal: Normal range of motion. She exhibits no edema and no tenderness.  Lymphadenopathy:    She has no cervical adenopathy.  Neurological: She is alert and oriented to person, place, and time. She has normal reflexes. No cranial nerve deficit.  Skin: Skin is warm and dry. She is not diaphoretic.  Psychiatric: She has a normal mood and affect. Her behavior is normal.          Assessment & Plan:  Patient presents for Medicare wellness examination but is also seen for followup of hyper lipidemia rheumatoid arthritis history of osteoporosis.  Appropriate screening for a thalassemia includes a CBC differential appropriate screening lipid levels will be obtained.  She also has a history of hypothyroidism and a TSH T3 and T4 will be drawn today.  Because she is on methotrexate for rheumatoid arthritis liver functions and basic metabolic panel will be drawn today to monitor the effects of these drugs on her liver and kidneys. Subjective:    Bonnie [name] is a 70 y.o. female who presents for Medicare Annual/Subsequent preventive examination.  Preventive Screening-Counseling & Management  Tobacco History  Smoking status  . Former Smoker -- 0.5 packs/day for 10 years  . Types: Cigarettes  . Quit date: 11/19/1973  Smokeless tobacco  . Not on file     Problems Prior to Visit 1.   Current Problems  (verified) Patient Active Problem List  Diagnoses  . Other and Unspecified Hyperlipidemia  . THALASSEMIA NEC  . HYPERTENSION  . ALLERGIC RHINITIS  . DIVERTICULOSIS, COLON  . RHEUMATOID ARTHRITIS  . LOC OSTEOARTHROS NOT SPEC WHETHER PRIM/SEC HAND  . FOOT PAIN, CHRONIC  . OSTEOPOROSIS  . OSTEOPENIA  . PALPITATIONS  . CERVICAL SPASM  . UNS ADVRS EFF UNS RX MEDICINAL&BIOLOGICAL SBSTNC  . COLONIC POLYPS, HX OF  . Multinodular goiter (nontoxic)    Medications Prior to  Visit Current Outpatient Prescriptions on File Prior to Visit  Medication Sig Dispense Refill  . acetaminophen (TYLENOL) 500 MG tablet Take 500 mg by mouth as needed.        Marland Kitchen aspirin 81 MG tablet Take 81 mg by mouth daily.        Marland Kitchen azelastine (ASTELIN) 137 MCG/SPRAY nasal spray 1 spray by Nasal route 2 (two) times daily. Use in each nostril as directed as needed       . celecoxib (CELEBREX) 200 MG capsule Take 200 mg by mouth as needed.        . Cyanocobalamin (VITAMIN B-12) 2500 MCG SUBL Place under the tongue daily.        . Estradiol (VAGIFEM) 10 MCG TABS Place vaginally. One tab per week       . fish oil-omega-3 fatty acids 1000 MG capsule Take 2 g by mouth daily.        . folic acid (FOLVITE) 1 MG tablet Take 2 mg by mouth daily.       Marland Kitchen HYDROcodone-acetaminophen (VICODIN) 5-500 MG per tablet Take 1 tablet by mouth every 6 (six) hours as needed.       Marland Kitchen levothyroxine (SYNTHROID, LEVOTHROID) 100 MCG tablet Take 1 tablet (100 mcg total) by mouth daily.  90 tablet  3  . mometasone (NASONEX) 50 MCG/ACT nasal spray 2 sprays by Nasal route daily.        . Multiple Vitamin (MULTIVITAMIN PO) Take by mouth daily.        . Pitavastatin Calcium (LIVALO) 4 MG TABS Take by mouth. One by mouth every Monday night       . RABEprazole (ACIPHEX) 20 MG tablet Take 20 mg by mouth daily as needed.         Current Medications (verified) Current Outpatient Prescriptions  Medication Sig Dispense Refill  . acetaminophen (TYLENOL) 500  MG tablet Take 500 mg by mouth as needed.        Marland Kitchen aspirin 81 MG tablet Take 81 mg by mouth daily.        Marland Kitchen azelastine (ASTELIN) 137 MCG/SPRAY nasal spray 1 spray by Nasal route 2 (two) times daily. Use in each nostril as directed as needed       . calcium citrate-vitamin D (CITRACAL+D) 315-200 MG-UNIT per tablet Take 1 tablet by mouth 2 (two) times daily.      . celecoxib (CELEBREX) 200 MG capsule Take 200 mg by mouth as needed.        . Cyanocobalamin (VITAMIN B-12) 2500 MCG SUBL Place under the tongue daily.        . Estradiol (VAGIFEM) 10 MCG TABS Place vaginally. One tab per week       . fish oil-omega-3 fatty acids 1000 MG capsule Take 2 g by mouth daily.        . folic acid (FOLVITE) 1 MG tablet Take 2 mg by mouth daily.       Marland Kitchen HYDROcodone-acetaminophen (VICODIN) 5-500 MG per tablet Take 1 tablet by mouth every 6 (six) hours as needed.       Marland Kitchen levothyroxine (SYNTHROID, LEVOTHROID) 100 MCG tablet Take 1 tablet (100 mcg total) by mouth daily.  90 tablet  3  . methotrexate (RHEUMATREX) 2.5 MG tablet Take 5 tablets (12.5 mg total) by mouth once a week.  5 tablet    . mometasone (NASONEX) 50 MCG/ACT nasal spray 2 sprays by Nasal route daily.        . Multiple Vitamin (MULTIVITAMIN  PO) Take by mouth daily.        . Pitavastatin Calcium (LIVALO) 4 MG TABS Take by mouth. One by mouth every Monday night       . RABEprazole (ACIPHEX) 20 MG tablet Take 20 mg by mouth daily as needed.          Allergies (verified) Bicozene   PAST HISTORY  Family History No family history on file.  Social History History  Substance Use Topics  . Smoking status: Former Smoker -- 0.5 packs/day for 10 years    Types: Cigarettes    Quit date: 11/19/1973  . Smokeless tobacco: Not on file  . Alcohol Use: No     Are there smokers in your home (other than you)? No  Risk Factors Current exercise habits: Home exercise routine includes stretching and treadmill.  Dietary issues discussed: none   Cardiac risk  factors: advanced age (older than 31 for men, 33 for women), dyslipidemia and sedentary lifestyle.  Depression Screen (Note: if answer to either of the following is "Yes", a more complete depression screening is indicated)   Over the past two weeks, have you felt down, depressed or hopeless? No  Over the past two weeks, have you felt little interest or pleasure in doing things? No  Have you lost interest or pleasure in daily life? No  Do you often feel hopeless? No  Do you cry easily over simple problems? No  Activities of Daily Living In your present state of health, do you have any difficulty performing the following activities?:  Driving? No Managing money?  No Feeding yourself? No Getting from bed to chair? No Climbing a flight of stairs? No Preparing food and eating?: No Bathing or showering? No Getting dressed: No Getting to the toilet? No Using the toilet:No Moving around from place to place: No In the past year have you fallen or had a near fall?:No   Are you sexually active?  Yes  Do you have more than one partner?  No  Hearing Difficulties: No Do you often ask people to speak up or repeat themselves? No Do you experience ringing or noises in your ears? No Do you have difficulty understanding soft or whispered voices? No   Do you feel that you have a problem with memory? No  Do you often misplace items? No  Do you feel safe at home?  Yes  Cognitive Testing  Alert? Yes  Normal Appearance?Yes  Oriented to person? Yes  Place? Yes   Time? Yes  Recall of three objects?  Yes  Can perform simple calculations? Yes  Displays appropriate judgment?Yes  Can read the correct time from a watch face?Yes   Advanced Directives have been discussed with the patient? Yes  List the Names of Other Physician/Practitioners you currently use: 1.  Rheumatology for RA  Indicate any recent Medical Services you may have received from other than [facility] providers in the past year (date may  be approximate).  Immunization History  Administered Date(s) Administered  . Influenza Split 06/14/2011  . Influenza Whole 07/10/2007, 07/17/2009, 08/20/2010  . Pneumococcal Polysaccharide 09/13/2005  . Td 09/13/2005    Screening Tests Health Maintenance  Topic Date Due  . Mammogram  06/01/1992  . Colonoscopy  06/01/1992  . Zostavax  06/01/2002  . Pneumococcal Polysaccharide Vaccine Age 42 And Over  06/02/2007  . Influenza Vaccine  06/13/2012  . Tetanus/tdap  09/14/2015    All answers were reviewed with the patient and necessary referrals were made:  Carrie Mew   09/13/2011   History reviewed: allergies, current medications, past family history, past medical history, past social history, past surgical history and problem list  Review of Systems A comprehensive review of systems was negative.    Objective:     Vision by Snellen chart: right eye:20/20, left eye:20/20  Body mass index is 25.37 kg/(m^2). BP 122/70  Pulse 72  Temp 98.2 F (36.8 C)  Resp 16  Ht 5\' 7"  (1.702 m)  Wt 162 lb (73.483 kg)  BMI 25.37 kg/m2  BP 122/70  Pulse 72  Temp 98.2 F (36.8 C)  Resp 16  Ht 5\' 7"  (1.702 m)  Wt 162 lb (73.483 kg)  BMI 25.37 kg/m2  General Appearance:    Alert, cooperative, no distress, appears stated age  Head:    Normocephalic, without obvious abnormality, atraumatic  Eyes:    PERRL, conjunctiva/corneas clear, EOM's intact, fundi    benign, both eyes  Ears:    Normal TM's and external ear canals, both ears  Nose:   Nares normal, septum midline, mucosa normal, no drainage    or sinus tenderness  Throat:   Lips, mucosa, and tongue normal; teeth and gums normal  Neck:   Supple, symmetrical, trachea midline, no adenopathy;    thyroid:  no enlargement/tenderness/nodules; no carotid   bruit or JVD  Back:     Symmetric, no curvature, ROM normal, no CVA tenderness  Lungs:     Clear to auscultation bilaterally, respirations unlabored  Chest Wall:    No tenderness or  deformity   Heart:    Regular rate and rhythm, S1 and S2 normal, no murmur, rub   or gallop  Breast Exam:    No tenderness, masses, or nipple abnormality  Abdomen:     Soft, non-tender, bowel sounds active all four quadrants,    no masses, no organomegaly  Genitalia:    Normal female without lesion, discharge or tenderness  Rectal:    Normal tone, normal prostate, no masses or tenderness;   guaiac negative stool  Extremities:   Extremities normal, atraumatic, no cyanosis or edema  Pulses:   2+ and symmetric all extremities  Skin:   Skin color, texture, turgor normal, no rashes or lesions  Lymph nodes:   Cervical, supraclavicular, and axillary nodes normal  Neurologic:   CNII-XII intact, normal strength, sensation and reflexes    throughout       Assessment:      This is a routine physical examination for this healthy  Female. Reviewed all health maintenance protocols including mammography colonoscopy bone density and reviewed appropriate screening labs. Her immunization history was reviewed as well as her current medications and allergies refills of her chronic medications were given and the plan for yearly health maintenance was discussed all orders and referrals were made as appropriate.     Plan:     During the course of the visit the patient was educated and counseled about appropriate screening and preventive services including:    Influenza vaccine  Screening mammography  Bone densitometry screening  Diet review for nutrition referral? Yes ____  Not Indicated __x__   Patient Instructions (the written plan) was given to the patient.  Medicare Attestation I have personally reviewed: The patient's medical and social history Their use of alcohol, tobacco or illicit drugs Their current medications and supplements The patient's functional ability including ADLs,fall risks, home safety risks, cognitive, and hearing and visual impairment Diet and physical  activities Evidence for depression or  mood disorders  The patient's weight, height, BMI, and visual acuity have been recorded in the chart.  I have made referrals, counseling, and provided education to the patient based on review of the above and I have provided the patient with a written personalized care plan for preventive services.     Darryll Capers EDWARD   09/13/2011

## 2011-06-15 LAB — POCT URINALYSIS DIPSTICK
Glucose, UA: NEGATIVE
Leukocytes, UA: NEGATIVE
Nitrite, UA: NEGATIVE
Urobilinogen, UA: 0.2

## 2011-06-28 ENCOUNTER — Telehealth: Payer: Self-pay | Admitting: *Deleted

## 2011-06-28 NOTE — Telephone Encounter (Signed)
Pt requesting results of labwork done 06/14/11 and would like a copy mailed.

## 2011-06-28 NOTE — Telephone Encounter (Signed)
All wnl except lipids were slightly high at 209 but hdl was great at 74 so it evens it out

## 2011-06-28 NOTE — Telephone Encounter (Signed)
Pt. Notified.

## 2011-07-08 ENCOUNTER — Telehealth: Payer: Self-pay

## 2011-07-08 NOTE — Telephone Encounter (Signed)
Please advise 

## 2011-07-08 NOTE — Telephone Encounter (Signed)
Pt called and stated she has the beginning of cold with a low grade fever since Sunday. Pt states she is coughing and mucus is shaded and pt states she can tell that her sinus on irritated because she gets a little blood when she blows her nose. Pt states she is taking Tylenol and zyrtec.  Pt states she just wants to make sure she is doing the right thing.  Pt would like to know if  needs to be seen or have an rx called in. Pls advise.

## 2011-07-09 NOTE — Telephone Encounter (Signed)
Left message with husband.

## 2011-07-09 NOTE — Telephone Encounter (Signed)
Per dr Lovell Sheehan- can take [facility] and zinc-thanks

## 2011-09-16 ENCOUNTER — Telehealth (INDEPENDENT_AMBULATORY_CARE_PROVIDER_SITE_OTHER): Payer: Self-pay | Admitting: Surgery

## 2011-09-16 ENCOUNTER — Telehealth: Payer: Self-pay | Admitting: *Deleted

## 2011-09-16 ENCOUNTER — Other Ambulatory Visit: Payer: Self-pay | Admitting: *Deleted

## 2011-09-16 MED ORDER — SCOPOLAMINE 1 MG/3DAYS TD PT72: 1.0000 | MEDICATED_PATCH | TRANSDERMAL | Status: DC

## 2011-09-16 NOTE — Telephone Encounter (Signed)
Done

## 2011-09-17 NOTE — Telephone Encounter (Signed)
Left results on voicemail and mailed last lab results.

## 2011-10-05 ENCOUNTER — Encounter: Payer: Self-pay | Admitting: Internal Medicine

## 2011-10-05 ENCOUNTER — Ambulatory Visit (INDEPENDENT_AMBULATORY_CARE_PROVIDER_SITE_OTHER): Payer: Medicare Other | Admitting: Internal Medicine

## 2011-10-05 VITALS — BP 140/80 | HR 72 | Temp 98.2°F | Resp 16 | Ht 66.0 in | Wt 166.0 lb

## 2011-10-05 DIAGNOSIS — E039 Hypothyroidism, unspecified: Secondary | ICD-10-CM

## 2011-10-05 NOTE — Patient Instructions (Signed)
The patient is instructed to continue all medications as prescribed. Schedule followup with check out clerk upon leaving the clinic  

## 2011-11-08 DIAGNOSIS — Z79899 Other long term (current) drug therapy: Secondary | ICD-10-CM | POA: Diagnosis not present

## 2011-11-10 DIAGNOSIS — M25519 Pain in unspecified shoulder: Secondary | ICD-10-CM | POA: Diagnosis not present

## 2011-11-10 DIAGNOSIS — M069 Rheumatoid arthritis, unspecified: Secondary | ICD-10-CM | POA: Diagnosis not present

## 2011-11-17 DIAGNOSIS — D485 Neoplasm of uncertain behavior of skin: Secondary | ICD-10-CM | POA: Diagnosis not present

## 2011-11-17 DIAGNOSIS — L739 Follicular disorder, unspecified: Secondary | ICD-10-CM | POA: Diagnosis not present

## 2011-11-17 DIAGNOSIS — L821 Other seborrheic keratosis: Secondary | ICD-10-CM | POA: Diagnosis not present

## 2011-11-17 DIAGNOSIS — L723 Sebaceous cyst: Secondary | ICD-10-CM | POA: Diagnosis not present

## 2011-11-17 DIAGNOSIS — L259 Unspecified contact dermatitis, unspecified cause: Secondary | ICD-10-CM | POA: Diagnosis not present

## 2011-11-19 ENCOUNTER — Encounter: Payer: Self-pay | Admitting: Gastroenterology

## 2011-12-03 ENCOUNTER — Other Ambulatory Visit (INDEPENDENT_AMBULATORY_CARE_PROVIDER_SITE_OTHER): Payer: Medicare Other

## 2011-12-03 DIAGNOSIS — E039 Hypothyroidism, unspecified: Secondary | ICD-10-CM

## 2011-12-03 LAB — TSH: TSH: 3.07 u[IU]/mL (ref 0.35–5.50)

## 2011-12-03 LAB — T3, FREE: T3, Free: 2.2 pg/mL — ABNORMAL LOW (ref 2.3–4.2)

## 2011-12-07 NOTE — Progress Notes (Signed)
  Subjective:    Patient ID: Bonnie [name], female    DOB: 08-Mar-1941, 71 y.o.   MRN: [id number]  HPI  Pt presents fro follow up of hypothyroidism and RA. She has been stable but has noted weight gain  Review of Systems  Constitutional: Negative for activity change, appetite change and fatigue.  HENT: Negative for ear pain, congestion, neck pain, postnasal drip and sinus pressure.   Eyes: Negative for redness and visual disturbance.  Respiratory: Negative for cough, shortness of breath and wheezing.   Gastrointestinal: Negative for abdominal pain and abdominal distention.  Genitourinary: Negative for dysuria, frequency and menstrual problem.  Musculoskeletal: Negative for myalgias, joint swelling and arthralgias.  Skin: Negative for rash and wound.  Neurological: Negative for dizziness, weakness and headaches.  Hematological: Negative for adenopathy. Does not bruise/bleed easily.  Psychiatric/Behavioral: Negative for sleep disturbance and decreased concentration.       Objective:   Physical Exam  Nursing note reviewed. Constitutional: She is oriented to person, place, and time. She appears well-developed and well-nourished. No distress.  HENT:  Head: Normocephalic and atraumatic.  Right Ear: External ear normal.  Left Ear: External ear normal.  Nose: Nose normal.  Mouth/Throat: Oropharynx is clear and moist.  Eyes: Conjunctivae and EOM are normal. Pupils are equal, round, and reactive to light.  Neck: Normal range of motion. Neck supple. No JVD present. No tracheal deviation present. No thyromegaly present.  Cardiovascular: Normal rate, regular rhythm, normal heart sounds and intact distal pulses.   No murmur [name]. Pulmonary/Chest: Effort normal and breath sounds normal. She has no wheezes. She exhibits no tenderness.  Abdominal: Soft. Bowel sounds are normal.  Musculoskeletal: Normal range of motion. She exhibits no edema and no tenderness.  Lymphadenopathy:    She has no  cervical adenopathy.  Neurological: She is alert and oriented to person, place, and time. She has normal reflexes. No cranial nerve deficit.  Skin: Skin is warm and dry. She is not diaphoretic.  Psychiatric: She has a normal mood and affect. Her behavior is normal.          Assessment & Plan:  Follow up in three months with TSH  t3 and t4 prior. monitor sed rate at [facility]

## 2011-12-09 ENCOUNTER — Other Ambulatory Visit: Payer: Self-pay | Admitting: *Deleted

## 2011-12-09 MED ORDER — LEVOTHYROXINE SODIUM 112 MCG PO TABS: 112.0000 ug | ORAL_TABLET | Freq: Every day | ORAL | Status: DC

## 2011-12-13 ENCOUNTER — Ambulatory Visit (INDEPENDENT_AMBULATORY_CARE_PROVIDER_SITE_OTHER): Payer: Medicare Other | Admitting: Internal Medicine

## 2011-12-13 ENCOUNTER — Encounter: Payer: Self-pay | Admitting: Internal Medicine

## 2011-12-13 VITALS — BP 136/74 | HR 72 | Temp 98.0°F | Resp 16 | Ht 67.0 in | Wt 169.0 lb

## 2011-12-13 DIAGNOSIS — E039 Hypothyroidism, unspecified: Secondary | ICD-10-CM | POA: Diagnosis not present

## 2011-12-13 MED ORDER — LEVOTHYROXINE SODIUM 112 MCG PO TABS: 112.0000 ug | ORAL_TABLET | Freq: Every day | ORAL | Status: DC

## 2011-12-13 NOTE — Progress Notes (Signed)
Subjective:    Patient ID: Bonnie [name], female    DOB: 22-Sep-1940, 71 y.o.   MRN: [id number]  HPI Pt presents for follow up. Has been seeing the rheumatologist  At [facility] rheumatology The CBC was normal and the CMET was also normal. Review of the thyroid result.        Review of Systems  Constitutional: Negative for activity change, appetite change and fatigue.  HENT: Negative for ear pain, congestion, neck pain, postnasal drip and sinus pressure.   Eyes: Negative for redness and visual disturbance.  Respiratory: Negative for cough, shortness of breath and wheezing.   Gastrointestinal: Negative for abdominal pain and abdominal distention.  Genitourinary: Negative for dysuria, frequency and menstrual problem.  Musculoskeletal: Negative for myalgias, joint swelling and arthralgias.  Skin: Negative for rash and wound.  Neurological: Negative for dizziness, weakness and headaches.  Hematological: Negative for adenopathy. Does not bruise/bleed easily.  Psychiatric/Behavioral: Negative for sleep disturbance and decreased concentration.   Past Medical History  Diagnosis Date  . Hyperlipidemia   . Thyroid lump   . Night sweats   . Fatigue   . Bruises easily   . Colon polyp   . Reflux   . Hemorrhoids   . Anemia   . Wears glasses   . Sinus problem   . Arthritis     muscle weaknes at times  . Menopause     History   Social History  . Marital Status: Married    Spouse Name: N/A    Number of Children: N/A  . Years of Education: N/A   Occupational History  . Not on file.   Social History Main Topics  . Smoking status: Former Smoker -- 0.5 packs/day for 10 years    Types: Cigarettes    Quit date: 11/19/1973  . Smokeless tobacco: Not on file  . Alcohol Use: No  . Drug Use: No  . Sexually Active: Yes   Other Topics Concern  . Not on file   Social History Narrative  . No narrative on file    Past Surgical History  Procedure Date  . Abdominal  hysterectomy   . Cholecystectomy     No family history on file.  Allergies  Allergen Reactions  . Bicozene     REACTION: burning    Current Outpatient Prescriptions on File Prior to Visit  Medication Sig Dispense Refill  . acetaminophen (TYLENOL) 500 MG tablet Take 500 mg by mouth as needed.        Marland Kitchen aspirin 81 MG tablet Take 81 mg by mouth daily.        . Calcium Citrate-Vitamin D (CALCIUM CITRATE + D) 250-200 MG-UNIT TABS Take 3 tablets by mouth.      . celecoxib (CELEBREX) 200 MG capsule Take 200 mg by mouth as needed.        . Cyanocobalamin (B-12) 1000 MCG LOZG Take 1 lozenge by mouth daily.      . diphenhydrAMINE (BENADRYL) 25 MG tablet Take 25 mg by mouth at bedtime as needed.      . Estradiol (VAGIFEM) 10 MCG TABS Place vaginally. One tab per week       . fish oil-omega-3 fatty acids 1000 MG capsule Take 2 g by mouth daily.        . folic acid (FOLVITE) 1 MG tablet Take 2 mg by mouth daily.       Marland Kitchen HYDROcodone-acetaminophen (VICODIN) 5-500 MG per tablet Take 1 tablet by mouth every 6 (six) hours  as needed.       Marland Kitchen levothyroxine (SYNTHROID, LEVOTHROID) 112 MCG tablet Take 1 tablet (112 mcg total) by mouth daily.  90 tablet  3  . methotrexate (RHEUMATREX) 2.5 MG tablet Take 5 tablets (12.5 mg total) by mouth once a week.  5 tablet    . Multiple Vitamin (MULTIVITAMIN PO) Take by mouth daily.        . Pitavastatin Calcium (LIVALO) 4 MG TABS Take by mouth. One by mouth every Monday night       . RABEprazole (ACIPHEX) 20 MG tablet Take 20 mg by mouth daily as needed.       Marland Kitchen scopolamine (TRANSDERM-SCOP) 1.5 MG Place 1 patch (1.5 mg total) onto the skin every 3 (three) days.  10 patch  0    BP 136/74  Pulse 72  Temp 98 F (36.7 C)  Resp 16  Ht 5\' 7"  (1.702 m)  Wt 169 lb (76.658 kg)  BMI 26.47 kg/m2       Objective:   Physical Exam  Nursing note and vitals reviewed. Constitutional: She is oriented to person, place, and time. She appears well-developed and  well-nourished. No distress.  HENT:  Head: Normocephalic and atraumatic.  Right Ear: External ear normal.  Left Ear: External ear normal.  Nose: Nose normal.  Mouth/Throat: Oropharynx is clear and moist.  Eyes: Conjunctivae and EOM are normal. Pupils are equal, round, and reactive to light.  Neck: Normal range of motion. Neck supple. No JVD present. No tracheal deviation present. No thyromegaly present.  Cardiovascular: Normal rate, regular rhythm, normal heart sounds and intact distal pulses.   No murmur heard. Pulmonary/Chest: Effort normal and breath sounds normal. She has no wheezes. She exhibits no tenderness.  Abdominal: Soft. Bowel sounds are normal.  Musculoskeletal: Normal range of motion. She exhibits no edema and no tenderness.  Lymphadenopathy:    She has no cervical adenopathy.  Neurological: She is alert and oriented to person, place, and time. She has normal reflexes. No cranial nerve deficit.  Skin: Skin is warm and dry. She is not diaphoretic.  Psychiatric: She has a normal mood and affect. Her behavior is normal.          Assessment & Plan:  Monitoring  New adjusted dose of 112

## 2011-12-13 NOTE — Patient Instructions (Signed)
By the middle of May schedule a complete thyroid panel and if you have not heard from os within one week after the panel call us to get her thyroid dose. If your dose is stable on the 112 mcg we will send out into mail order pharmacy

## 2011-12-27 ENCOUNTER — Telehealth: Payer: Self-pay | Admitting: Internal Medicine

## 2011-12-27 DIAGNOSIS — E039 Hypothyroidism, unspecified: Secondary | ICD-10-CM

## 2011-12-27 MED ORDER — LEVOTHYROXINE SODIUM 112 MCG PO TABS: 112.0000 ug | ORAL_TABLET | Freq: Every day | ORAL | Status: DC

## 2011-12-27 NOTE — Telephone Encounter (Signed)
Done

## 2011-12-27 NOTE — Telephone Encounter (Signed)
Pt  prquesting 30 day supply of levothyroxine (SYNTHROID, LEVOTHROID) 112 MCG tablet    [facility] [address]

## 2012-01-17 DIAGNOSIS — M069 Rheumatoid arthritis, unspecified: Secondary | ICD-10-CM | POA: Diagnosis not present

## 2012-01-20 ENCOUNTER — Other Ambulatory Visit (INDEPENDENT_AMBULATORY_CARE_PROVIDER_SITE_OTHER): Payer: Medicare Other

## 2012-01-20 DIAGNOSIS — E039 Hypothyroidism, unspecified: Secondary | ICD-10-CM | POA: Diagnosis not present

## 2012-01-20 LAB — TSH: TSH: 0.79 u[IU]/mL (ref 0.35–5.50)

## 2012-01-24 ENCOUNTER — Encounter (HOSPITAL_BASED_OUTPATIENT_CLINIC_OR_DEPARTMENT_OTHER): Payer: Self-pay | Admitting: *Deleted

## 2012-01-24 ENCOUNTER — Telehealth: Payer: Self-pay | Admitting: *Deleted

## 2012-01-24 ENCOUNTER — Emergency Department (HOSPITAL_BASED_OUTPATIENT_CLINIC_OR_DEPARTMENT_OTHER)
Admission: EM | Admit: 2012-01-24 | Discharge: 2012-01-24 | Disposition: A | Discharge status: Home / Self Care | Payer: Medicare Other | Attending: Emergency Medicine | Admitting: Emergency Medicine

## 2012-01-24 DIAGNOSIS — X131XXA Other contact with steam and other hot vapors, initial encounter: Secondary | ICD-10-CM | POA: Insufficient documentation

## 2012-01-24 DIAGNOSIS — J029 Acute pharyngitis, unspecified: Secondary | ICD-10-CM | POA: Diagnosis not present

## 2012-01-24 DIAGNOSIS — E785 Hyperlipidemia, unspecified: Secondary | ICD-10-CM | POA: Insufficient documentation

## 2012-01-24 DIAGNOSIS — T280XXA Burn of mouth and pharynx, initial encounter: Secondary | ICD-10-CM

## 2012-01-24 DIAGNOSIS — I10 Essential (primary) hypertension: Secondary | ICD-10-CM | POA: Insufficient documentation

## 2012-01-24 DIAGNOSIS — T310 Burns involving less than 10% of body surface: Secondary | ICD-10-CM | POA: Diagnosis not present

## 2012-01-24 DIAGNOSIS — X12XXXA Contact with other hot fluids, initial encounter: Secondary | ICD-10-CM | POA: Insufficient documentation

## 2012-01-24 MED ORDER — GI COCKTAIL ~~LOC~~
30.0000 mL | Freq: Once | ORAL | Status: AC
  Administered 2012-01-24: 30 mL via ORAL
  Filled 2012-01-24: qty 30

## 2012-01-24 MED ORDER — HYDROCODONE-ACETAMINOPHEN 7.5-500 MG/15ML PO SOLN: 15.0000 mL | Freq: Four times a day (QID) | ORAL | Status: AC | PRN

## 2012-01-24 MED ORDER — HYDROCODONE-ACETAMINOPHEN 7.5-500 MG/15ML PO SOLN
10.0000 mL | Freq: Once | ORAL | Status: AC
  Administered 2012-01-24: 10 mL via ORAL
  Filled 2012-01-24: qty 15

## 2012-01-24 NOTE — Discharge Instructions (Signed)

## 2012-01-24 NOTE — Telephone Encounter (Signed)
Husband called and scheduler answered,stating pt had spilled hot coffee in her mouth and now has little bumps in mouth. They called ar 4:05 in the afternoon.  Scheduler called me and ask what to do that her mouth was sore, I suggested for her to tell them to go to urgent care since it was after 4 pm a nd we still had several pts to see.  Scheduler states she told husband to try the urgent care and he told her , he was not pleased with that answer.

## 2012-01-24 NOTE — ED Notes (Signed)
Pain in her throat after drinking hot coffee.

## 2012-01-24 NOTE — ED Provider Notes (Signed)
History    This chart was scribed for Dayton Bailiff, MD, MD by Smitty Pluck. The patient was seen in room MH01 and the patient's care was started at 5:12PM.   CSN: [id number]  Arrival date & time 01/24/12  1644   First MD Initiated Contact with Patient 01/24/12 1711      Chief Complaint  Patient presents with  . Sore Throat    (Consider location/radiation/quality/duration/timing/severity/associated sxs/prior treatment) The history is provided by the patient.   Bonnie [name] is a 71 y.o. female who presents to the Emergency Department complaining of moderate sore throat onset today. Pt reports that she burned her throat accidentally due to drinking extremely hot coffee that she fixed at home. Pt has been eating ice chips with minor relief. Symptoms have been constant without radiation. Pt denies any other pain and symptoms.   Past Medical History  Diagnosis Date  . Hyperlipidemia   . Thyroid lump   . Night sweats   . Fatigue   . Bruises easily   . Colon polyp   . Reflux   . Hemorrhoids   . Anemia   . Wears glasses   . Sinus problem   . Arthritis     muscle weaknes at times  . Menopause   . Thyroid disease   . Hypertension     Past Surgical History  Procedure Date  . Abdominal hysterectomy   . Cholecystectomy   . Thyroidectomy     No family history on file.  History  Substance Use Topics  . Smoking status: Former Smoker -- 0.5 packs/day for 10 years    Types: Cigarettes    Quit date: 11/19/1973  . Smokeless tobacco: Not on file  . Alcohol Use: No    OB History    Grav Para Term Preterm Abortions TAB SAB Ect Mult Living                  Review of Systems  Constitutional: Negative for fever, chills, activity change, appetite change and fatigue.  HENT: Positive for sore throat. Negative for congestion, rhinorrhea, neck pain and neck stiffness.   Respiratory: Negative for cough and shortness of breath.   Cardiovascular: Negative for chest pain and  palpitations.  Gastrointestinal: Negative for nausea, vomiting and abdominal pain.  Genitourinary: Negative for dysuria, urgency, frequency and flank pain.  Musculoskeletal: Negative for myalgias, back pain and arthralgias.  Neurological: Negative for dizziness, weakness, light-headedness, numbness and headaches.  All other systems reviewed and are negative.   10 Systems reviewed and all are negative for acute change except as noted in the HPI.   Allergies  Neosporin  Home Medications   Current Outpatient Rx  Name Route Sig Dispense Refill  . ASPIRIN 81 MG PO TABS Oral Take 81 mg by mouth daily.      Marland Kitchen CALCIUM CITRATE-VITAMIN D 250-200 MG-UNIT PO TABS Oral Take 3 tablets by mouth.    Marland Kitchen DIPHENHYDRAMINE HCL 25 MG PO TABS Oral Take 25 mg by mouth at bedtime as needed.    [name] PO Oral Take 1 tablet by mouth daily as needed. Patient used this medication for her allergies.    . OMEGA-3 FATTY ACIDS 1000 MG PO CAPS Oral Take 2 g by mouth daily.      Marland Kitchen FOLIC ACID 1 MG PO TABS Oral Take 2 mg by mouth daily.     Marland Kitchen LEVOTHYROXINE SODIUM 112 MCG PO TABS Oral Take 1 tablet (112 mcg total) by mouth daily.  30 tablet 11    Branded synthroid only  . MULTIVITAMIN PO Oral Take by mouth daily.      . B-12 1000 MCG PO LOZG Oral Take 1 lozenge by mouth daily.    Marland Kitchen ESTRADIOL 10 MCG VA TABS Vaginal Place vaginally. One tab per week     . HYDROCODONE-ACETAMINOPHEN 7.5-500 MG/15ML PO SOLN Oral Take 15 mLs by mouth every 6 (six) hours as needed for pain. 60 mL 0  . METHOTREXATE 2.5 MG PO TABS Oral Take 5 tablets (12.5 mg total) by mouth once a week. 5 tablet   . PITAVASTATIN CALCIUM 4 MG PO TABS Oral Take by mouth. One by mouth every Monday night       BP 167/88  Pulse 54  Temp(Src) 98 F (36.7 C) (Oral)  Resp 20  SpO2 100%  Physical Exam  Nursing note and vitals reviewed. Constitutional: She is oriented to person, place, and time. She appears well-developed and well-nourished. No distress.    HENT:  Head: Normocephalic and atraumatic.  Mouth/Throat: Oropharynx is clear and moist. No oropharyngeal exudate.  Eyes: Conjunctivae and EOM are normal. Pupils are equal, round, and reactive to light.  Neck: Normal range of motion. Neck supple.  Cardiovascular: Normal rate, regular rhythm, normal heart sounds and intact distal pulses.  Exam reveals no gallop and no friction rub.   No murmur heard. Pulmonary/Chest: Effort normal and breath sounds normal. No respiratory distress. She exhibits no tenderness.  Abdominal: Soft. She exhibits no distension. There is no tenderness. There is no rebound and no guarding.  Musculoskeletal: Normal range of motion. She exhibits no tenderness.  Neurological: She is alert and oriented to person, place, and time.  Skin: Skin is warm and dry.  Psychiatric: She has a normal mood and affect. Her behavior is normal.    ED Course  Procedures (including critical care time) DIAGNOSTIC STUDIES: Oxygen Saturation is 100% on room air, normal by my interpretation.    COORDINATION OF CARE: 5:18PM EDP discusses pt ED treatment with pt.  5:29PM EDP ordered medication: Lortab 10 mL and gi cocktail    Labs Reviewed - No data to display No results found.   1. Burn of mouth or pharynx       MDM  Minor superficial burn of the oral cavity and oropharynx. There is no blistering. Relatively normal examination. She has no shortness of breath. Is provided pain medication and a GI cocktail numerous department. Discharged home with instructions to return if worse  I personally performed the services described in this documentation, which was scribed in my presence. The recorded information has been reviewed and considered.         Dayton Bailiff, MD 01/24/12 1739

## 2012-02-01 ENCOUNTER — Ambulatory Visit (INDEPENDENT_AMBULATORY_CARE_PROVIDER_SITE_OTHER): Payer: Medicare Other | Admitting: Family

## 2012-02-01 ENCOUNTER — Encounter: Payer: Self-pay | Admitting: Family

## 2012-02-01 VITALS — BP 136/84 | Temp 98.6°F | Wt 171.0 lb

## 2012-02-01 DIAGNOSIS — T300 Burn of unspecified body region, unspecified degree: Secondary | ICD-10-CM

## 2012-02-01 DIAGNOSIS — T280XXA Burn of mouth and pharynx, initial encounter: Secondary | ICD-10-CM

## 2012-02-01 NOTE — Progress Notes (Signed)
Subjective:    Patient ID: Bonnie [name], female    DOB: 13-Sep-1941, 70 y.o.   MRN: [id number]  HPI 71 year old white female, nonsmoker, patient of Dr. Lovell Sheehan is in for recheck of a burn of the pharynx and mouth. She was treated at an urgent care clinic for first degree burn. She's doing much better today. Throat is no longer sore. She continues to have dry skin to her right upper lip. Denies any drainage or discharge. No fever, redness, or pain.   Review of Systems  Constitutional: Negative.   HENT: Negative.   Respiratory: Negative.   Cardiovascular: Negative.   Musculoskeletal: Negative.   Skin: Negative.   Hematological: Negative.   Psychiatric/Behavioral: Negative.    Past Medical History  Diagnosis Date  . Hyperlipidemia   . Thyroid lump   . Night sweats   . Fatigue   . Bruises easily   . Colon polyp   . Reflux   . Hemorrhoids   . Anemia   . Wears glasses   . Sinus problem   . Arthritis     muscle weaknes at times  . Menopause   . Thyroid disease   . Hypertension     History   Social History  . Marital Status: Married    Spouse Name: N/A    Number of Children: N/A  . Years of Education: N/A   Occupational History  . Not on file.   Social History Main Topics  . Smoking status: Former Smoker -- 0.5 packs/day for 10 years    Types: Cigarettes    Quit date: 11/19/1973  . Smokeless tobacco: Not on file  . Alcohol Use: No  . Drug Use: No  . Sexually Active: Yes   Other Topics Concern  . Not on file   Social History Narrative  . No narrative on file    Past Surgical History  Procedure Date  . Abdominal hysterectomy   . Cholecystectomy   . Thyroidectomy     No family history on file.  Allergies  Allergen Reactions  . Neosporin (Neomycin-Bacitracin Zn-Polymyx) Other (See Comments)    Patient states that wounds or scratches never heal.    Current Outpatient Prescriptions on File Prior to Visit  Medication Sig Dispense Refill  . aspirin  81 MG tablet Take 81 mg by mouth daily.        . Calcium Citrate-Vitamin D (CALCIUM CITRATE + D) 250-200 MG-UNIT TABS Take 3 tablets by mouth.      . Cyanocobalamin (B-12) 1000 MCG LOZG Take 1 lozenge by mouth daily.      . diphenhydrAMINE (BENADRYL) 25 MG tablet Take 25 mg by mouth at bedtime as needed.      . Estradiol (VAGIFEM) 10 MCG TABS Place vaginally. One tab per week       . Fexofenadine HCl (ALLEGRA PO) Take 1 tablet by mouth daily as needed. Patient used this medication for her allergies.      . fish oil-omega-3 fatty acids 1000 MG capsule Take 2 g by mouth daily.        . folic acid (FOLVITE) 1 MG tablet Take 2 mg by mouth daily.       Marland Kitchen HYDROcodone-acetaminophen (LORTAB) 7.5-500 MG/15ML solution Take 15 mLs by mouth every 6 (six) hours as needed for pain.  60 mL  0  . levothyroxine (SYNTHROID, LEVOTHROID) 112 MCG tablet Take 1 tablet (112 mcg total) by mouth daily.  30 tablet  11  . methotrexate (RHEUMATREX) 2.5  MG tablet Take 5 tablets (12.5 mg total) by mouth once a week.  5 tablet    . Multiple Vitamin (MULTIVITAMIN PO) Take by mouth daily.        . Pitavastatin Calcium (LIVALO) 4 MG TABS Take by mouth. One by mouth every Monday night         BP 136/84  Temp(Src) 98.6 F (37 C) (Oral)  Wt 171 lb (77.565 kg)chart    Objective:   Physical Exam  Constitutional: She is oriented to person, place, and time. She appears well-developed and well-nourished.  HENT:  Right Ear: External ear normal.  Left Ear: External ear normal.  Nose: Nose normal.  Neck: Normal range of motion. Neck supple.  Cardiovascular: Normal rate, regular rhythm and normal heart sounds.   Pulmonary/Chest: Effort normal and breath sounds normal.  Neurological: She is alert and oriented to person, place, and time.  Skin: Skin is warm.  Psychiatric: She has a normal mood and affect.          Assessment & Plan:  Assessment: First degree burn of the pharynx  Plan: Patient is feeling well. Recheck with  PCP as scheduled and sooner when necessary.

## 2012-02-16 DIAGNOSIS — H2510 Age-related nuclear cataract, unspecified eye: Secondary | ICD-10-CM | POA: Diagnosis not present

## 2012-02-18 DIAGNOSIS — Z1231 Encounter for screening mammogram for malignant neoplasm of breast: Secondary | ICD-10-CM | POA: Diagnosis not present

## 2012-02-18 DIAGNOSIS — Z1289 Encounter for screening for malignant neoplasm of other sites: Secondary | ICD-10-CM | POA: Diagnosis not present

## 2012-03-09 DIAGNOSIS — M25519 Pain in unspecified shoulder: Secondary | ICD-10-CM | POA: Diagnosis not present

## 2012-03-09 DIAGNOSIS — M069 Rheumatoid arthritis, unspecified: Secondary | ICD-10-CM | POA: Diagnosis not present

## 2012-03-20 ENCOUNTER — Telehealth: Payer: Self-pay | Admitting: Internal Medicine

## 2012-03-20 DIAGNOSIS — H9319 Tinnitus, unspecified ear: Secondary | ICD-10-CM | POA: Diagnosis not present

## 2012-03-20 DIAGNOSIS — J309 Allergic rhinitis, unspecified: Secondary | ICD-10-CM | POA: Diagnosis not present

## 2012-03-20 DIAGNOSIS — M279 Disease of jaws, unspecified: Secondary | ICD-10-CM | POA: Diagnosis not present

## 2012-03-20 DIAGNOSIS — H9110 Presbycusis, unspecified ear: Secondary | ICD-10-CM | POA: Diagnosis not present

## 2012-03-20 NOTE — Telephone Encounter (Signed)
Caller: Foye/Patient; PCP: Darryll Capers; CB#: [phone];  Call regarding Jaw Hurts With Biting -Onset 03/19/12 and tender to touch. Sx started after eating peanuts and almonds. She has hx of Jaw clicking with chewing. She can open fully but hurts and  teeth does not fully touch with closing. 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 She will  call back if sx no improvement by 03/21/12. Triage per Jaw Sx Protocol.

## 2012-03-21 ENCOUNTER — Other Ambulatory Visit: Payer: Self-pay | Admitting: Otolaryngology

## 2012-03-21 DIAGNOSIS — R439 Unspecified disturbances of smell and taste: Secondary | ICD-10-CM

## 2012-04-04 ENCOUNTER — Ambulatory Visit
Admission: RE | Admit: 2012-04-04 | Discharge: 2012-04-04 | Disposition: A | Discharge status: Home / Self Care | Payer: Medicare Other | Source: Ambulatory Visit | Attending: Otolaryngology | Admitting: Otolaryngology

## 2012-04-04 DIAGNOSIS — R439 Unspecified disturbances of smell and taste: Secondary | ICD-10-CM

## 2012-04-04 DIAGNOSIS — I10 Essential (primary) hypertension: Secondary | ICD-10-CM | POA: Diagnosis not present

## 2012-04-04 DIAGNOSIS — H9319 Tinnitus, unspecified ear: Secondary | ICD-10-CM | POA: Diagnosis not present

## 2012-04-04 MED ORDER — GADOBENATE DIMEGLUMINE 529 MG/ML IV SOLN
15.0000 mL | Freq: Once | INTRAVENOUS | Status: AC | PRN
  Administered 2012-04-04: 15 mL via INTRAVENOUS

## 2012-04-24 ENCOUNTER — Telehealth: Payer: Self-pay | Admitting: Internal Medicine

## 2012-04-24 MED ORDER — RED YEAST RICE 600 MG PO CAPS: 1.0000 | ORAL_CAPSULE | Freq: Every day | ORAL | Status: DC

## 2012-04-24 NOTE — Telephone Encounter (Signed)
Caller: Manmeet/Patient; Patient Name: Bonnie [name]; PCP: Darryll Capers; Best Callback Phone Number: [phone].  Patient calling about pain in legs 4 to 5 months ago after starting Pitavastatin.  She stopped Pitavastatin  and no longer has any leg pains.  She does not have follow up for several months, and asks if she should try Pitavastatin again or if there is other medication that she should take for her cholesterol.  Confirmed next appintment is 06/14/12 with Dr. Lovell Sheehan.  Per Medication Questions protocol, Speak with Provider within 24 hours due to "Older adult with questions about prescribed and/ or non prescribed medications not covered by available resources.".  Pharmacy information confirmed in Epic.  Note to provider as office is open.

## 2012-04-24 NOTE — Telephone Encounter (Signed)
Pt informed

## 2012-04-24 NOTE — Telephone Encounter (Signed)
Try red rice yeast 1 qd per dr Lovell Sheehan

## 2012-05-11 DIAGNOSIS — M069 Rheumatoid arthritis, unspecified: Secondary | ICD-10-CM | POA: Diagnosis not present

## 2012-05-18 ENCOUNTER — Ambulatory Visit (INDEPENDENT_AMBULATORY_CARE_PROVIDER_SITE_OTHER): Payer: Medicare Other | Admitting: Internal Medicine

## 2012-05-18 ENCOUNTER — Encounter: Payer: Self-pay | Admitting: Internal Medicine

## 2012-05-18 VITALS — BP 162/82 | Temp 98.0°F | Wt 169.0 lb

## 2012-05-18 DIAGNOSIS — L0390 Cellulitis, unspecified: Secondary | ICD-10-CM

## 2012-05-18 DIAGNOSIS — L0291 Cutaneous abscess, unspecified: Secondary | ICD-10-CM | POA: Diagnosis not present

## 2012-05-18 MED ORDER — DOXYCYCLINE HYCLATE 100 MG PO TABS: 100.0000 mg | ORAL_TABLET | Freq: Two times a day (BID) | ORAL | Status: AC

## 2012-05-18 NOTE — Progress Notes (Signed)
Subjective:    Patient ID: Bonnie [name], female    DOB: 1940/09/30, 71 y.o.   MRN: [id number]  HPI  71 year old white female with history of rheumatoid arthritis complains of small bump inside of right nose x 2 weeks. Patient has tried using over-the-counter bacitracin and warm water compress. Patient reports area drained for a few days and she felt better but now small bump has returned. Area is tender.  Review of Systems No fever or chills  Past Medical History  Diagnosis Date  . Hyperlipidemia   . Thyroid lump   . Night sweats   . Fatigue   . Bruises easily   . Colon polyp   . Reflux   . Hemorrhoids   . Anemia   . Wears glasses   . Sinus problem   . Arthritis     muscle weaknes at times  . Menopause   . Thyroid disease   . Hypertension     History   Social History  . Marital Status: Married    Spouse Name: N/A    Number of Children: N/A  . Years of Education: N/A   Occupational History  . Not on file.   Social History Main Topics  . Smoking status: Former Smoker -- 0.5 packs/day for 10 years    Types: Cigarettes    Quit date: 11/19/1973  . Smokeless tobacco: Not on file  . Alcohol Use: No  . Drug Use: No  . Sexually Active: Yes   Other Topics Concern  . Not on file   Social History Narrative  . No narrative on file    Past Surgical History  Procedure Date  . Abdominal hysterectomy   . Cholecystectomy   . Thyroidectomy     No family history on file.  Allergies  Allergen Reactions  . Neosporin (Neomycin-Bacitracin Zn-Polymyx) Other (See Comments)    Patient states that wounds or scratches never heal.    Current Outpatient Prescriptions on File Prior to Visit  Medication Sig Dispense Refill  . aspirin 81 MG tablet Take 81 mg by mouth daily.        . Calcium Citrate-Vitamin D (CALCIUM CITRATE + D) 250-200 MG-UNIT TABS Take 2 tablets by mouth.       . Cyanocobalamin (B-12) 1000 MCG LOZG Take 1 lozenge by mouth daily.      .  diphenhydrAMINE (BENADRYL) 25 MG tablet Take 25 mg by mouth at bedtime as needed.      . Estradiol (VAGIFEM) 10 MCG TABS Place vaginally. One tab per week       . fish oil-omega-3 fatty acids 1000 MG capsule Take 2 g by mouth daily.        . folic acid (FOLVITE) 1 MG tablet Take 2 mg by mouth daily.       Marland Kitchen levothyroxine (SYNTHROID, LEVOTHROID) 112 MCG tablet Take 1 tablet (112 mcg total) by mouth daily.  30 tablet  11  . methotrexate (RHEUMATREX) 2.5 MG tablet Take 5 tablets (12.5 mg total) by mouth once a week.  5 tablet    . Multiple Vitamin (MULTIVITAMIN PO) Take by mouth daily.        . Red Yeast Rice 600 MG CAPS Take 1 capsule (600 mg total) by mouth daily.  30 capsule  11    BP 162/82  Temp 98 F (36.7 C) (Oral)  Wt 169 lb (76.658 kg)       Objective:   Physical Exam  Constitutional: She appears well-developed and  well-nourished.  HENT:  Head: Normocephalic and atraumatic.  Right Ear: External ear normal.  Left Ear: External ear normal.       Small (subcentimeter) tender area inside right nares.  Mild redness.  No drainage.          Assessment & Plan:

## 2012-05-18 NOTE — Patient Instructions (Addendum)
Continue use bacitracin ointment 3 times a day and saline rinse [place] Alpha as directed. Monitor your blood pressure at home Please call our office if your symptoms do not improve or gets worse.

## 2012-05-18 NOTE — Assessment & Plan Note (Signed)
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 She report area drained on its own several days ago. She has persistent small tender area. I suggest patient continue to use bacitracin and warm compress. If symptoms worsen, patient to use doxycycline 100 mg twice daily for 7 days. Patient advised to call office if symptoms persist or worsen.

## 2012-06-14 ENCOUNTER — Ambulatory Visit (INDEPENDENT_AMBULATORY_CARE_PROVIDER_SITE_OTHER): Payer: Medicare Other | Admitting: Internal Medicine

## 2012-06-14 ENCOUNTER — Encounter: Payer: Self-pay | Admitting: Internal Medicine

## 2012-06-14 VITALS — BP 140/84 | HR 68 | Temp 98.0°F | Resp 16 | Ht 67.0 in | Wt 171.0 lb

## 2012-06-14 DIAGNOSIS — Z0000 Encounter for general adult medical examination without abnormal findings: Secondary | ICD-10-CM | POA: Diagnosis not present

## 2012-06-14 DIAGNOSIS — Z23 Encounter for immunization: Secondary | ICD-10-CM | POA: Diagnosis not present

## 2012-06-14 DIAGNOSIS — D568 Other thalassemias: Secondary | ICD-10-CM | POA: Diagnosis not present

## 2012-06-14 DIAGNOSIS — T887XXA Unspecified adverse effect of drug or medicament, initial encounter: Secondary | ICD-10-CM

## 2012-06-14 DIAGNOSIS — E785 Hyperlipidemia, unspecified: Secondary | ICD-10-CM | POA: Diagnosis not present

## 2012-06-14 DIAGNOSIS — E042 Nontoxic multinodular goiter: Secondary | ICD-10-CM

## 2012-06-14 DIAGNOSIS — M069 Rheumatoid arthritis, unspecified: Secondary | ICD-10-CM

## 2012-06-14 DIAGNOSIS — I10 Essential (primary) hypertension: Secondary | ICD-10-CM | POA: Diagnosis not present

## 2012-06-14 LAB — IRON: Iron: 67 ug/dL (ref 42–145)

## 2012-06-14 LAB — CBC WITH DIFFERENTIAL/PLATELET
Eosinophils Relative: 2.1 % (ref 0.0–5.0)
Lymphocytes Relative: 23.7 % (ref 12.0–46.0)
MCV: 95.8 fl (ref 78.0–100.0)
Monocytes Absolute: 0.5 10*3/uL (ref 0.1–1.0)
Neutrophils Relative %: 65.2 % (ref 43.0–77.0)
Platelets: 263 10*3/uL (ref 150.0–400.0)
RBC: 4.01 Mil/uL (ref 3.87–5.11)
WBC: 5.9 10*3/uL (ref 4.5–10.5)

## 2012-06-14 LAB — BASIC METABOLIC PANEL
Calcium: 9 mg/dL (ref 8.4–10.5)
Creatinine, Ser: 0.7 mg/dL (ref 0.4–1.2)
GFR: 87.92 mL/min (ref >60.00)
Sodium: 143 mEq/L (ref 135–145)

## 2012-06-14 LAB — POCT URINALYSIS DIPSTICK
Blood, UA: NEGATIVE
Glucose, UA: NEGATIVE
Leukocytes, UA: NEGATIVE
Nitrite, UA: NEGATIVE
Urobilinogen, UA: 0.2
pH, UA: 6.5

## 2012-06-14 LAB — LIPID PANEL
HDL: 64.8 mg/dL (ref >39.00)
Total CHOL/HDL Ratio: 4
Triglycerides: 66 mg/dL (ref 0.0–149.0)
VLDL: 13.2 mg/dL (ref 0.0–40.0)

## 2012-06-14 LAB — HEPATIC FUNCTION PANEL
Alkaline Phosphatase: 43 U/L (ref 39–117)
Bilirubin, Direct: 0 mg/dL (ref 0.0–0.3)
Total Bilirubin: 0.6 mg/dL (ref 0.3–1.2)

## 2012-06-14 LAB — LDL CHOLESTEROL, DIRECT: Direct LDL: 159.1 mg/dL

## 2012-06-14 LAB — TSH: TSH: 1.09 u[IU]/mL (ref 0.35–5.50)

## 2012-06-14 NOTE — Progress Notes (Signed)
Subjective:    Patient ID: Bonnie [name], female    DOB: April 28, 1941, 71 y.o.   MRN: [id number]  HPI    Review of Systems  Constitutional: Negative for activity change, appetite change and fatigue.  HENT: Negative for ear pain, congestion, neck pain, postnasal drip and sinus pressure.   Eyes: Negative for redness and visual disturbance.  Respiratory: Negative for cough, shortness of breath and wheezing.   Gastrointestinal: Negative for abdominal pain and abdominal distention.  Genitourinary: Negative for dysuria, frequency and menstrual problem.  Musculoskeletal: Negative for myalgias, joint swelling and arthralgias.  Skin: Negative for rash and wound.  Neurological: Negative for dizziness, weakness and headaches.  Hematological: Negative for adenopathy. Does not bruise/bleed easily.  Psychiatric/Behavioral: Negative for disturbed wake/sleep cycle and decreased concentration.       Objective:   Physical Exam  Nursing note and vitals reviewed. Constitutional: She is oriented to person, place, and time. She appears well-developed and well-nourished. No distress.  HENT:  Head: Normocephalic and atraumatic.  Right Ear: External ear normal.  Left Ear: External ear normal.  Nose: Nose normal.  Mouth/Throat: Oropharynx is clear and moist.  Eyes: Conjunctivae normal and EOM are normal. Pupils are equal, round, and reactive to light.  Neck: Normal range of motion. Neck supple. No JVD present. No tracheal deviation present. No thyromegaly present.  Cardiovascular: Normal rate, regular rhythm, normal heart sounds and intact distal pulses.   No murmur heard. Pulmonary/Chest: Effort normal and breath sounds normal. She has no wheezes. She exhibits no tenderness.  Abdominal: Soft. Bowel sounds are normal.  Musculoskeletal: Normal range of motion. She exhibits no edema and no tenderness.  Lymphadenopathy:    She has no cervical adenopathy.  Neurological: She is alert and oriented to  person, place, and time. She has normal reflexes. No cranial nerve deficit.  Skin: Skin is warm and dry. She is not diaphoretic.  Psychiatric: She has a normal mood and affect. Her behavior is normal.          Assessment & Plan:    Stable allergies HTN stable Hx of goiter monitor labs for lipid control , stable renal function and Thyroid replacement Hx of thalassemia check CBC  Subjective:    Bonnie [name] is a 71 y.o. female who presents for Medicare Annual/Subsequent preventive examination.  Preventive Screening-Counseling & Management  Tobacco History  Smoking status  . Former Smoker -- 0.5 packs/day for 10 years  . Types: Cigarettes  . Quit date: 11/19/1973  Smokeless tobacco  . Not on file     Problems Prior to Visit 1. Rhematoid arthritis Hx of lipids Hypertension  Current Problems (verified) Patient Active Problem List  Diagnosis  . Other and Unspecified Hyperlipidemia  . THALASSEMIA NEC  . HYPERTENSION  . ALLERGIC RHINITIS  . DIVERTICULOSIS, COLON  . RHEUMATOID ARTHRITIS  . LOC OSTEOARTHROS NOT SPEC WHETHER PRIM/SEC HAND  . FOOT PAIN, CHRONIC  . OSTEOPOROSIS  . OSTEOPENIA  . PALPITATIONS  . CERVICAL SPASM  . UNS ADVRS EFF UNS RX MEDICINAL&BIOLOGICAL SBSTNC  . COLONIC POLYPS, HX OF  . Multinodular goiter (nontoxic)  . Abscess    Medications Prior to Visit Current Outpatient Prescriptions on File Prior to Visit  Medication Sig Dispense Refill  . aspirin 81 MG tablet Take 81 mg by mouth daily.        . Calcium Citrate-Vitamin D (CALCIUM CITRATE + D) 250-200 MG-UNIT TABS Take 2 tablets by mouth.       . Cyanocobalamin (B-12)  1000 MCG LOZG Take 1 lozenge by mouth daily.      . diphenhydrAMINE (BENADRYL) 25 MG tablet Take 25 mg by mouth at bedtime as needed.      . Estradiol (VAGIFEM) 10 MCG TABS Place vaginally. One tab per week       . fish oil-omega-3 fatty acids 1000 MG capsule Take 2 g by mouth daily.        . folic acid (FOLVITE) 1 MG  tablet Take 2 mg by mouth daily.       Marland Kitchen levothyroxine (SYNTHROID, LEVOTHROID) 112 MCG tablet Take 1 tablet (112 mcg total) by mouth daily.  30 tablet  11  . methotrexate (RHEUMATREX) 2.5 MG tablet Take 5 tablets (12.5 mg total) by mouth once a week.  5 tablet    . Multiple Vitamin (MULTIVITAMIN PO) Take by mouth daily.        . Red Yeast Rice 600 MG CAPS Take 1 capsule (600 mg total) by mouth daily.  30 capsule  11    Current Medications (verified) Current Outpatient Prescriptions  Medication Sig Dispense Refill  . aspirin 81 MG tablet Take 81 mg by mouth daily.        . Calcium Citrate-Vitamin D (CALCIUM CITRATE + D) 250-200 MG-UNIT TABS Take 2 tablets by mouth.       . Cyanocobalamin (B-12) 1000 MCG LOZG Take 1 lozenge by mouth daily.      . diphenhydrAMINE (BENADRYL) 25 MG tablet Take 25 mg by mouth at bedtime as needed.      . Estradiol (VAGIFEM) 10 MCG TABS Place vaginally. One tab per week       . fish oil-omega-3 fatty acids 1000 MG capsule Take 2 g by mouth daily.        . folic acid (FOLVITE) 1 MG tablet Take 2 mg by mouth daily.       Marland Kitchen levothyroxine (SYNTHROID, LEVOTHROID) 112 MCG tablet Take 1 tablet (112 mcg total) by mouth daily.  30 tablet  11  . methotrexate (RHEUMATREX) 2.5 MG tablet Take 5 tablets (12.5 mg total) by mouth once a week.  5 tablet    . Multiple Vitamin (MULTIVITAMIN PO) Take by mouth daily.        . Red Yeast Rice 600 MG CAPS Take 1 capsule (600 mg total) by mouth daily.  30 capsule  11     Allergies (verified) Neosporin   PAST HISTORY  Family History No family history on file.  Social History History  Substance Use Topics  . Smoking status: Former Smoker -- 0.5 packs/day for 10 years    Types: Cigarettes    Quit date: 11/19/1973  . Smokeless tobacco: Not on file  . Alcohol Use: No     Are there smokers in your home (other than you)? No  Risk Factors Current exercise habits: Gym/ health club routine includes cardio.  Dietary issues  discussed: gluten free diet   Cardiac risk factors: dyslipidemia and hypertension.  Depression Screen (Note: if answer to either of the following is "Yes", a more complete depression screening is indicated)   Over the past two weeks, have you felt down, depressed or hopeless? No  Over the past two weeks, have you felt little interest or pleasure in doing things? No  Have you lost interest or pleasure in daily life? No  Do you often feel hopeless? No  Do you cry easily over simple problems? No  Activities of Daily Living In your present state of  health, do you have any difficulty performing the following activities?:  Driving? No Managing money?  No Feeding yourself? No Getting from bed to chair? No Climbing a flight of stairs? No Preparing food and eating?: No Bathing or showering? No Getting dressed: No Getting to the toilet? No Using the toilet:No Moving around from place to place: No In the past year have you fallen or had a near fall?:No   Are you sexually active?  Yes  Do you have more than one partner?  No  Hearing Difficulties: No Do you often ask people to speak up or repeat themselves? No Do you experience ringing or noises in your ears? No Do you have difficulty understanding soft or whispered voices? No   Do you feel that you have a problem with memory? No  Do you often misplace items? No  Do you feel safe at home?  No  Cognitive Testing  Alert? Yes  Normal Appearance?Yes  Oriented to person? Yes  Place? Yes   Time? Yes  Recall of three objects?  Yes  Can perform simple calculations? Yes  Displays appropriate judgment?Yes  Can read the correct time from a watch face?Yes   Advanced Directives have been discussed with the patient? Yes  List the Names of Other Physician/Practitioners you currently use: 1.  Rhematology  Indicate any recent Medical Services you may have received from other than [facility] providers in the past year (date may be  approximate).  Immunization History  Administered Date(s) Administered  . Influenza Split 06/14/2011  . Influenza Whole 07/10/2007, 07/17/2009, 08/20/2010  . Pneumococcal Polysaccharide 09/13/2005  . Td 09/13/2005    Screening Tests Health Maintenance  Topic Date Due  . Colonoscopy  06/01/1992  . Zostavax  06/01/2002  . Pneumococcal Polysaccharide Vaccine Age 19 And Over  06/02/2007  . Influenza Vaccine  05/14/2012  . Tetanus/tdap  09/14/2015    All answers were reviewed with the patient and necessary referrals were made:  Carrie Mew, MD   06/14/2012   History reviewed: allergies, current medications, past family history, past medical history, past social history, past surgical history and problem list  Review of Systems A comprehensive review of systems was negative except for: Gastrointestinal: positive for change in bowel habits Musculoskeletal: positive for arthralgias, myalgias and stiff joints    Objective:     Vision by Snellen chart: right eye:20/20, left eye:20/20 corrected  Body mass index is 26.78 kg/(m^2). BP 140/84  Pulse 68  Temp 98 F (36.7 C)  Resp 16  Ht 5\' 7"  (1.702 m)  Wt 171 lb (77.565 kg)  BMI 26.78 kg/m2  BP 140/84  Pulse 68  Temp 98 F (36.7 C)  Resp 16  Ht 5\' 7"  (1.702 m)  Wt 171 lb (77.565 kg)  BMI 26.78 kg/m2  General Appearance:    Alert, cooperative, no distress, appears stated age  Head:    Normocephalic, without obvious abnormality, atraumatic  Eyes:    PERRL, conjunctiva/corneas clear, EOM's intact, fundi    benign, both eyes  Ears:    Normal TM's and external ear canals, both ears  Nose:   Nares normal, septum midline, mucosa normal, no drainage    or sinus tenderness  Throat:   Lips, mucosa, and tongue normal; teeth and gums normal  Neck:   Supple, symmetrical, trachea midline, no adenopathy;    thyroid:  no enlargement/tenderness/nodules; no carotid   bruit or JVD  Back:     Symmetric, no curvature, ROM normal, no CVA  tenderness  Lungs:     Clear to auscultation bilaterally, respirations unlabored  Chest Wall:    No tenderness or deformity   Heart:    Regular rate and rhythm, S1 and S2 normal, no murmur, rub   or gallop  Breast Exam:    No tenderness, masses, or nipple abnormality  Abdomen:     Soft, non-tender, bowel sounds active all four quadrants,    no masses, no organomegaly  Genitalia:    Normal female without lesion, discharge or tenderness  Rectal:    Normal tone, normal prostate, no masses or tenderness;   guaiac negative stool  Extremities:   Extremities normal, atraumatic, no cyanosis or edema  Pulses:   2+ and symmetric all extremities  Skin:   Skin color, texture, turgor normal, no rashes or lesions  Lymph nodes:   Cervical, supraclavicular, and axillary nodes normal  Neurologic:   CNII-XII intact, normal strength, sensation and reflexes    throughout       Assessment:     This is a routine physical examination for this healthy  Female. Reviewed all health maintenance protocols including mammography colonoscopy bone density and reviewed appropriate screening labs. Her immunization history was reviewed as well as her current medications and allergies refills of her chronic medications were given and the plan for yearly health maintenance was discussed all orders and referrals were made as appropriate.      Plan:     During the course of the visit the patient was educated and counseled about appropriate screening and preventive services including:    Influenza vaccine  Screening mammography  Bone densitometry screening  Diet review for nutrition referral? Yes ____  Not Indicated ___x_   Patient Instructions (the written plan) was given to the patient.  Medicare Attestation I have personally reviewed: The patient's medical and social history Their use of alcohol, tobacco or illicit drugs Their current medications and supplements The patient's functional ability including  ADLs,fall risks, home safety risks, cognitive, and hearing and visual impairment Diet and physical activities Evidence for depression or mood disorders  The patient's weight, height, BMI, and visual acuity have been recorded in the chart.  I have made referrals, counseling, and provided education to the patient based on review of the above and I have provided the patient with a written personalized care plan for preventive services.     Carrie Mew, MD   06/14/2012

## 2012-06-14 NOTE — Patient Instructions (Signed)
The patient is instructed to continue all medications as prescribed. Schedule followup with check out clerk upon leaving the clinic  

## 2012-06-23 ENCOUNTER — Encounter: Payer: Self-pay | Admitting: Internal Medicine

## 2012-06-26 ENCOUNTER — Telehealth: Payer: Self-pay | Admitting: Internal Medicine

## 2012-06-26 ENCOUNTER — Other Ambulatory Visit: Payer: Self-pay | Admitting: *Deleted

## 2012-06-26 DIAGNOSIS — E039 Hypothyroidism, unspecified: Secondary | ICD-10-CM

## 2012-06-26 MED ORDER — LEVOTHYROXINE SODIUM 112 MCG PO TABS: 112.0000 ug | ORAL_TABLET | Freq: Every day | ORAL | Status: DC

## 2012-06-26 NOTE — Telephone Encounter (Signed)
Caller: Bonnie [name]/Patient; Phone: [phone]; Reason for Call: Patient calling regarding Synthroid and wants it to be faxed to [facility] for 90 days supply.

## 2012-06-26 NOTE — Telephone Encounter (Signed)
Sent in as requested 

## 2012-07-03 ENCOUNTER — Encounter: Payer: Self-pay | Admitting: Internal Medicine

## 2012-07-06 ENCOUNTER — Encounter: Payer: Self-pay | Admitting: Internal Medicine

## 2012-07-10 DIAGNOSIS — M069 Rheumatoid arthritis, unspecified: Secondary | ICD-10-CM | POA: Diagnosis not present

## 2012-07-11 ENCOUNTER — Encounter: Payer: Self-pay | Admitting: Internal Medicine

## 2012-07-12 ENCOUNTER — Encounter: Payer: Self-pay | Admitting: Internal Medicine

## 2012-07-15 ENCOUNTER — Encounter: Payer: Self-pay | Admitting: Internal Medicine

## 2012-07-15 DIAGNOSIS — Z23 Encounter for immunization: Secondary | ICD-10-CM | POA: Diagnosis not present

## 2012-08-13 ENCOUNTER — Encounter: Payer: Self-pay | Admitting: Internal Medicine

## 2012-08-22 ENCOUNTER — Ambulatory Visit (INDEPENDENT_AMBULATORY_CARE_PROVIDER_SITE_OTHER): Payer: Medicare Other | Admitting: Family Medicine

## 2012-08-22 ENCOUNTER — Encounter: Payer: Self-pay | Admitting: Family Medicine

## 2012-08-22 VITALS — BP 130/80 | HR 64 | Temp 97.4°F | Wt 174.0 lb

## 2012-08-22 DIAGNOSIS — IMO0002 Reserved for concepts with insufficient information to code with codable children: Secondary | ICD-10-CM | POA: Diagnosis not present

## 2012-08-22 MED ORDER — CEPHALEXIN 500 MG PO TABS: 500.0000 mg | ORAL_TABLET | Freq: Four times a day (QID) | ORAL | Status: DC

## 2012-08-22 NOTE — Patient Instructions (Signed)
Paronychia  Paronychia is an infection of the skin caused by germs. It happens by the fingernail or toenail. You can avoid it by not:  Pulling on hangnails.  Nail biting.  Thumb sucking.  Cutting fingernails and toenails too short.  Cutting the skin at the base and sides of the fingernail or toenail (cuticle). HOME CARE  Keep the fingers or toes very dry. Put rubber gloves over cotton gloves when putting hands in water when doing house cleaning or dishes.  Keep the wound clean and bandaged (dressed) as told by your doctor.  Soak the fingers or toes in warm soapy water for 15 to 20 minutes. Soak them 2 times daily. Fungal infections are difficult to treat. Fungal infections often require treatment for a long time.  Only take medicine as told by your doctor. GET HELP RIGHT AWAY IF:   You have redness, puffiness (swelling), or pain that gets worse.  You see yellowish-white fluid (pus) coming from the wound.  You have a fever.  You have a bad smell coming from the wound or bandage. MAKE SURE YOU:  Understand these instructions.  Will watch your condition.  Will get help if you are not doing well or get worse. Document Released: 08/18/2009 Document Revised: 11/22/2011 Document Reviewed: 08/18/2009 [facility] Patient Information 2013 [place], [place].

## 2012-08-22 NOTE — Progress Notes (Signed)
Chief Complaint  Patient presents with  . infected finger    HPI:  Acute visit for ? Finger infection: -started about 1-2 months ago but then got better, then worsened again  -L middle finger -slight erythema and pain around nail -told PCP a few weeks ago and told to continue tx and be seen if not improving  -has been trying over the counter antibiotic ointments and soaks initially - not soaking anymore -nail tech lifted and cut nail and applied neosporin under nail a few weeks ago - pt allergic to neosporin -has been doing a little better today   ROS: See pertinent positives and negatives per HPI.  Past Medical History  Diagnosis Date  . Hyperlipidemia   . Thyroid lump   . Night sweats   . Fatigue   . Bruises easily   . Colon polyp   . Reflux   . Hemorrhoids   . Anemia   . Wears glasses   . Sinus problem   . Arthritis     muscle weaknes at times  . Menopause   . Thyroid disease   . Hypertension     No family history on file.  History   Social History  . Marital Status: Married    Spouse Name: N/A    Number of Children: N/A  . Years of Education: N/A   Social History Main Topics  . Smoking status: Former Smoker -- 0.5 packs/day for 10 years    Types: Cigarettes    Quit date: 11/19/1973  . Smokeless tobacco: None  . Alcohol Use: No  . Drug Use: No  . Sexually Active: Yes   Other Topics Concern  . None   Social History Narrative  . None    Current outpatient prescriptions:aspirin 81 MG tablet, Take 81 mg by mouth daily.  , Disp: , Rfl: ;  Calcium Citrate-Vitamin D (CALCIUM CITRATE + D) 250-200 MG-UNIT TABS, Take 2 tablets by mouth. , Disp: , Rfl: ;  Cyanocobalamin (B-12) 1000 MCG LOZG, Take 1 lozenge by mouth daily., Disp: , Rfl: ;  diphenhydrAMINE (BENADRYL) 25 MG tablet, Take 25 mg by mouth at bedtime as needed., Disp: , Rfl:  Estradiol (VAGIFEM) 10 MCG TABS, Place vaginally. One tab per week , Disp: , Rfl: ;  fish oil-omega-3 fatty acids 1000 MG  capsule, Take 2 g by mouth daily.  , Disp: , Rfl: ;  folic acid (FOLVITE) 1 MG tablet, Take 2 mg by mouth daily. , Disp: , Rfl: ;  levothyroxine (SYNTHROID, LEVOTHROID) 112 MCG tablet, Take 1 tablet (112 mcg total) by mouth daily., Disp: 90 tablet, Rfl: 3 methotrexate (RHEUMATREX) 2.5 MG tablet, Take 5 tablets (12.5 mg total) by mouth once a week., Disp: 5 tablet, Rfl: ;  Multiple Vitamin (MULTIVITAMIN PO), Take by mouth daily.  , Disp: , Rfl: ;  Red Yeast Rice 600 MG CAPS, Take 1 capsule (600 mg total) by mouth daily., Disp: 30 capsule, Rfl: 11;  Cephalexin 500 MG tablet, Take 1 tablet (500 mg total) by mouth 4 (four) times daily., Disp: 40 tablet, Rfl: 0  EXAM:  Filed Vitals:   08/22/12 1104  BP: 130/80  Pulse: 64  Temp: 97.4 F (36.3 C)    There is no height on file to calculate BMI.  GENERAL: vitals reviewed and listed above, alert, oriented, appears well hydrated and in no acute distress  HEENT: atraumatic, conjunttiva clear, no obvious abnormalities on inspection of external nose and ears  NECK: no obvious masses on  inspection  SKIN: paronychia L middle finger - no abscess or phalon noted   MS: moves all extremities without noticeable abnormality  PSYCH: pleasant and cooperative, no obvious depression or anxiety  ASSESSMENT AND PLAN:  Discussed the following assessment and plan:  1. Paronychia  Cephalexin 500 MG tablet   -pt with paronychia on methotrexate - given duration will tx with abx and soaks with follow up to ensure resolving -follow up in 1-2 weeks  -Patient advised to return or notify a doctor immediately if symptoms worsen or persist or new concerns arise.  Patient Instructions  Paronychia  Paronychia is an infection of the skin caused by germs. It happens by the fingernail or toenail. You can avoid it by not:  Pulling on hangnails.  Nail biting.  Thumb sucking.  Cutting fingernails and toenails too short.  Cutting the skin at the base and sides of  the fingernail or toenail (cuticle). HOME CARE  Keep the fingers or toes very dry. Put rubber gloves over cotton gloves when putting hands in water when doing house cleaning or dishes.  Keep the wound clean and bandaged (dressed) as told by your doctor.  Soak the fingers or toes in warm soapy water for 15 to 20 minutes. Soak them 2 times daily. Fungal infections are difficult to treat. Fungal infections often require treatment for a long time.  Only take medicine as told by your doctor. GET HELP RIGHT AWAY IF:   You have redness, puffiness (swelling), or pain that gets worse.  You see yellowish-white fluid (pus) coming from the wound.  You have a fever.  You have a bad smell coming from the wound or bandage. MAKE SURE YOU:  Understand these instructions.  Will watch your condition.  Will get help if you are not doing well or get worse. Document Released: 08/18/2009 Document Revised: 11/22/2011 Document Reviewed: 08/18/2009 [facility] Patient Information 2013 [place], [place], [place]

## 2012-08-29 ENCOUNTER — Ambulatory Visit: Payer: Medicare Other | Admitting: Family Medicine

## 2012-08-29 ENCOUNTER — Encounter: Payer: Self-pay | Admitting: Family

## 2012-08-29 ENCOUNTER — Ambulatory Visit (INDEPENDENT_AMBULATORY_CARE_PROVIDER_SITE_OTHER): Payer: Medicare Other | Admitting: Family

## 2012-08-29 VITALS — BP 162/90 | HR 67 | Temp 98.0°F | Wt 174.0 lb

## 2012-08-29 DIAGNOSIS — L600 Ingrowing nail: Secondary | ICD-10-CM | POA: Diagnosis not present

## 2012-08-29 DIAGNOSIS — IMO0001 Reserved for inherently not codable concepts without codable children: Secondary | ICD-10-CM

## 2012-08-30 NOTE — Progress Notes (Signed)
Subjective:    Patient ID: Bonnie [name], female    DOB: 1941-09-13, 71 y.o.   MRN: [id number]  HPI 71 year old white female, nonsmoker, patient of Dr. Lovell Sheehan is in today after seeing Dr. Orvan Falconer for a paronychia last week. She was treated with cephalexin and her symptoms have not resolved. She's also been soaking her finger hasn't been effective. Symptoms started shortly after receiving a manicure. Denies any current drainage or discharge. This red and tender to touch.    Review of Systems  Constitutional: Negative.   Respiratory: Negative.   Cardiovascular: Negative.   Skin: Positive for wound. Negative for rash.       Ingrown nail to the right index finger  Psychiatric/Behavioral: Negative.    Past Medical History  Diagnosis Date  . Hyperlipidemia   . Thyroid lump   . Night sweats   . Fatigue   . Bruises easily   . Colon polyp   . Reflux   . Hemorrhoids   . Anemia   . Wears glasses   . Sinus problem   . Arthritis     muscle weaknes at times  . Menopause   . Thyroid disease   . Hypertension     History   Social History  . Marital Status: Married    Spouse Name: N/A    Number of Children: N/A  . Years of Education: N/A   Occupational History  . Not on file.   Social History Main Topics  . Smoking status: Former Smoker -- 0.5 packs/day for 10 years    Types: Cigarettes    Quit date: 11/19/1973  . Smokeless tobacco: Not on file  . Alcohol Use: No  . Drug Use: No  . Sexually Active: Yes   Other Topics Concern  . Not on file   Social History Narrative  . No narrative on file    Past Surgical History  Procedure Date  . Abdominal hysterectomy   . Cholecystectomy   . Thyroidectomy     No family history on file.  Allergies  Allergen Reactions  . Neosporin (Neomycin-Bacitracin Zn-Polymyx) Other (See Comments)    Patient states that wounds or scratches never heal.    Current Outpatient Prescriptions on File Prior to Visit  Medication Sig  Dispense Refill  . aspirin 81 MG tablet Take 81 mg by mouth daily.        . Calcium Citrate-Vitamin D (CALCIUM CITRATE + D) 250-200 MG-UNIT TABS Take 2 tablets by mouth.       . Cephalexin 500 MG tablet Take 1 tablet (500 mg total) by mouth 4 (four) times daily.  40 tablet  0  . Cyanocobalamin (B-12) 1000 MCG LOZG Take 1 lozenge by mouth daily.      . diphenhydrAMINE (BENADRYL) 25 MG tablet Take 25 mg by mouth at bedtime as needed.      . Estradiol (VAGIFEM) 10 MCG TABS Place vaginally. One tab per week       . fish oil-omega-3 fatty acids 1000 MG capsule Take 2 g by mouth daily.        . folic acid (FOLVITE) 1 MG tablet Take 2 mg by mouth daily.       Marland Kitchen levothyroxine (SYNTHROID, LEVOTHROID) 112 MCG tablet Take 1 tablet (112 mcg total) by mouth daily.  90 tablet  3  . methotrexate (RHEUMATREX) 2.5 MG tablet Take 5 tablets (12.5 mg total) by mouth once a week.  5 tablet    . Multiple Vitamin (MULTIVITAMIN PO)  Take by mouth daily.        . Red Yeast Rice 600 MG CAPS Take 1 capsule (600 mg total) by mouth daily.  30 capsule  11    BP 162/90  Pulse 67  Temp 98 F (36.7 C)  Wt 174 lb (78.926 kg)  SpO2 98%chart     Objective:   Physical Exam  Constitutional: She is oriented to person, place, and time. She appears well-developed and well-nourished.  Cardiovascular: Normal rate, regular rhythm and normal heart sounds.   Pulmonary/Chest: Effort normal and breath sounds normal.  Neurological: She is alert and oriented to person, place, and time.  Skin: Skin is warm and dry.       Right lateral cuticle  And distal finger is red, swollen, and tender to touch. No active drainage or discharge.   Psychiatric: She has a normal mood and affect.      Under sterile technique: Informed consent was obtained. Third digit on the right hand was anesthetized with lidocaine.1mm of the nailbed was removed along with the ingrown fingernail. Patient tolerated the procedure well. CMA dressed and bandaged the  wound.     Assessment & Plan:  Assessment: Ingrown nail   Plan: Epsom salts soaks. Keep the area covered when she's out of her house. Continue antibiotics. Patient call the office if symptoms worsen or persist. Recheck a schedule, and when necessary.

## 2012-08-31 ENCOUNTER — Encounter: Payer: Self-pay | Admitting: Family

## 2012-08-31 ENCOUNTER — Ambulatory Visit (INDEPENDENT_AMBULATORY_CARE_PROVIDER_SITE_OTHER): Payer: Medicare Other | Admitting: Family

## 2012-08-31 VITALS — BP 150/80 | HR 82 | Wt 174.0 lb

## 2012-08-31 DIAGNOSIS — L03019 Cellulitis of unspecified finger: Secondary | ICD-10-CM

## 2012-08-31 DIAGNOSIS — L02519 Cutaneous abscess of unspecified hand: Secondary | ICD-10-CM

## 2012-08-31 DIAGNOSIS — L600 Ingrowing nail: Secondary | ICD-10-CM

## 2012-08-31 MED ORDER — MUPIROCIN 2 % EX OINT: TOPICAL_OINTMENT | Freq: Three times a day (TID) | CUTANEOUS | Status: DC

## 2012-08-31 NOTE — Progress Notes (Signed)
Subjective:    Patient ID: Bonnie [name], female    DOB: 06-Feb-1941, 71 y.o.   MRN: [id number]  HPI 71 year old white female, nonsmoker, is in for a recheck of her right middle finger after having a ingrown nail removed 2 days ago. She was originally seen by Dr. Selena Batten who diagnosed a paronychia that did not respond to cephalexin. After having the ingrown nail removed, her finger was much better x 1 day and symptoms of redness and pain returned.  She is on day 8 of cephalexin. Has a history of Rheumatoid Arthritis and take Methotrexate.    Review of Systems  Constitutional: Negative.  Negative for fever and fatigue.  Respiratory: Negative.   Cardiovascular: Negative.   Skin: Positive for rash and wound.  Neurological: Negative.   Psychiatric/Behavioral: Negative.    Past Medical History  Diagnosis Date  . Hyperlipidemia   . Thyroid lump   . Night sweats   . Fatigue   . Bruises easily   . Colon polyp   . Reflux   . Hemorrhoids   . Anemia   . Wears glasses   . Sinus problem   . Arthritis     muscle weaknes at times  . Menopause   . Thyroid disease   . Hypertension     History   Social History  . Marital Status: Married    Spouse Name: N/A    Number of Children: N/A  . Years of Education: N/A   Occupational History  . Not on file.   Social History Main Topics  . Smoking status: Former Smoker -- 0.5 packs/day for 10 years    Types: Cigarettes    Quit date: 11/19/1973  . Smokeless tobacco: Not on file  . Alcohol Use: No  . Drug Use: No  . Sexually Active: Yes   Other Topics Concern  . Not on file   Social History Narrative  . No narrative on file    Past Surgical History  Procedure Date  . Abdominal hysterectomy   . Cholecystectomy   . Thyroidectomy     No family history on file.  Allergies  Allergen Reactions  . Neosporin (Neomycin-Bacitracin Zn-Polymyx) Other (See Comments)    Patient states that wounds or scratches never heal.    Current  Outpatient Prescriptions on File Prior to Visit  Medication Sig Dispense Refill  . aspirin 81 MG tablet Take 81 mg by mouth daily.        . Calcium Citrate-Vitamin D (CALCIUM CITRATE + D) 250-200 MG-UNIT TABS Take 2 tablets by mouth.       . Cephalexin 500 MG tablet Take 1 tablet (500 mg total) by mouth 4 (four) times daily.  40 tablet  0  . Cyanocobalamin (B-12) 1000 MCG LOZG Take 1 lozenge by mouth daily.      . diphenhydrAMINE (BENADRYL) 25 MG tablet Take 25 mg by mouth at bedtime as needed.      . Estradiol (VAGIFEM) 10 MCG TABS Place vaginally. One tab per week       . fish oil-omega-3 fatty acids 1000 MG capsule Take 2 g by mouth daily.        . folic acid (FOLVITE) 1 MG tablet Take 2 mg by mouth daily.       Marland Kitchen levothyroxine (SYNTHROID, LEVOTHROID) 112 MCG tablet Take 1 tablet (112 mcg total) by mouth daily.  90 tablet  3  . methotrexate (RHEUMATREX) 2.5 MG tablet Take 5 tablets (12.5 mg total)  by mouth once a week.  5 tablet    . Multiple Vitamin (MULTIVITAMIN PO) Take by mouth daily.        . Red Yeast Rice 600 MG CAPS Take 1 capsule (600 mg total) by mouth daily.  30 capsule  11    BP 150/80  Pulse 82  Wt 174 lb (78.926 kg)  SpO2 98%chart    Objective:   Physical Exam  Constitutional: She is oriented to person, place, and time. She appears well-developed and well-nourished.  Pulmonary/Chest: Breath sounds normal.  Neurological: She is alert and oriented to person, place, and time.  Skin: Skin is warm and dry. There is erythema.       Distal right middle phalanx is tender, swollen and red. No drainage or discharge.   Psychiatric: She has a normal mood and affect.          Assessment & Plan:  Assessment: Cellulitis vs. Ingrown Fingernail vs. Internal wart  Plan: After Consulting with Dr. Selena Batten, we will refer to dermatology for further management. Since she is going out of town, doxycycline 100mg  twice a day x 7 days. She has a RX at home from September when she saw Dr. Artist Pais.  Topical bactroban.

## 2012-09-01 ENCOUNTER — Telehealth: Payer: Self-pay | Admitting: Internal Medicine

## 2012-09-01 NOTE — Telephone Encounter (Signed)
Caller: Bonnie [name]/Patient; Phone: [phone]; Reason for Call: Patient calling with medication question.  Seen in follow up ingrown fingernail 08/31/12.  Taking doxycycline which she had not used at all from a previous visit, and [name] told her to use that.  Current Rx is for BID x 7 days and wants to know if that is an appropriate dose.  Per Epic notes from 08/31/12, to take doxycycline 100mg  1 tab BID x 7 days.  Patient advised; this matches the instructions on her bottle as well.  No further questions or concerns at this time.

## 2012-09-04 ENCOUNTER — Encounter: Payer: Self-pay | Admitting: Internal Medicine

## 2012-09-04 ENCOUNTER — Telehealth: Payer: Self-pay | Admitting: Internal Medicine

## 2012-09-04 NOTE — Telephone Encounter (Signed)
Patient Information:  Caller Name: Almee  Phone: [phone]  Patient: Bonnie [name], Bonnie [name]  Gender: Female  DOB: 1940-11-14  Age: 71 Years  PCP: Kriste Basque [facility])  Office Follow Up:  Does the office need to follow up with this patient?: Yes  Instructions For The Office: Please follow up with patient - See nurses note  RN Note:  Patient is soaking in epsom salt three times a day.  Small scab present.  Finger is tender to the touch.  Patient states mild improvement to tenderness and redness with no draining.  Some peeling of skin on top of finger at incision site.  Patient would like to know if she needs additional antibiotics since she will complete the Doxycycline  on 09/06/12 and there has only been minimal improvement to finger.  Patient is still out of town in [place] and unable to come to office for evaluation.  Due to patient traveling to different locations if new medication is to be called in please contact patient to find nearest pharmacy location at [phone].  Symptoms  Reason For Call & Symptoms: Infection of the finger and taking Doxycycline  Reviewed Health History In EMR: Yes  Reviewed Medications In EMR: Yes  Reviewed Allergies In EMR: Yes  Reviewed Surgeries / Procedures: Yes  Date of Onset of Symptoms: 08/31/2012  Treatments Tried: Doxycycline  Treatments Tried Worked: No  Guideline(s) Used:  Wound Infection  Disposition Per Guideline:   See Within 3 Days in Office  Reason For Disposition Reached:   Wound hasn't healed within 10 days after the injury  Advice Given:  Antibiotic Ointment:  Apply an antibiotic ointment 3 times a day. If the area could become dirty, cover with a Band-Aid or a clean gauze dressing.  Call Back If:   Wound becomes more tender  Redness starts to spread  Pus, drainage, or fever occurs  You become worse  Patient Refused Recommendation:  Patient Requests Prescription  Please see nurses note

## 2012-09-05 ENCOUNTER — Encounter: Payer: Self-pay | Admitting: Family

## 2012-09-05 NOTE — Telephone Encounter (Signed)
Complete ABX- she should not need additional

## 2012-09-05 NOTE — Telephone Encounter (Signed)
Pt aware.

## 2012-09-08 ENCOUNTER — Other Ambulatory Visit: Payer: Self-pay | Admitting: *Deleted

## 2012-09-08 ENCOUNTER — Other Ambulatory Visit: Payer: Self-pay | Admitting: Family

## 2012-09-08 DIAGNOSIS — L0390 Cellulitis, unspecified: Secondary | ICD-10-CM

## 2012-09-14 ENCOUNTER — Telehealth: Payer: Self-pay | Admitting: Internal Medicine

## 2012-09-14 NOTE — Telephone Encounter (Signed)
Nicole - misidirected. Sending to you - not sure if you can get a sooner appt?

## 2012-09-14 NOTE — Telephone Encounter (Signed)
[facility] office is booked out over a month even the PA's are booked out into February the only way the patient could get in sooner is if Padonda calls to request she be seen sooner.  Pt called the office 09/12/12  because she was schedule at [facility] for her toe and a second referral was placed and I got her a sooner appt at [facility], pt was informed she could pick which appt would best fit her.I had the pt an appt at the [facility] surgery center scheduled for tomorrow 09/15/12 but pt call and reschedule for March.

## 2012-09-14 NOTE — Telephone Encounter (Signed)
Noted. Sent to [facility] for review

## 2012-09-14 NOTE — Telephone Encounter (Signed)
Patient called stating that she continues to have problems with her finger and upon calling [facility] they stated that the Provider should call if a sooner appt is needed. Please assist.

## 2012-09-14 NOTE — Telephone Encounter (Signed)
Padonda requested that you call Roxan Hockey to get pt a sooner appt please

## 2012-09-15 NOTE — Telephone Encounter (Signed)
Please request.

## 2012-09-18 DIAGNOSIS — M79609 Pain in unspecified limb: Secondary | ICD-10-CM | POA: Diagnosis not present

## 2012-09-18 DIAGNOSIS — Z0000 Encounter for general adult medical examination without abnormal findings: Secondary | ICD-10-CM | POA: Diagnosis not present

## 2012-09-18 DIAGNOSIS — M069 Rheumatoid arthritis, unspecified: Secondary | ICD-10-CM | POA: Diagnosis not present

## 2012-09-18 NOTE — Telephone Encounter (Signed)
[phone]

## 2012-09-18 NOTE — Telephone Encounter (Signed)
Dr. Elease Etienne Office will not let me schedule a sooner appt to get a sooner appt Oran Rein will have to call

## 2012-09-18 NOTE — Telephone Encounter (Signed)
Please provide provider contact info

## 2012-09-19 ENCOUNTER — Other Ambulatory Visit: Payer: Self-pay | Admitting: Dermatology

## 2012-09-19 DIAGNOSIS — L03039 Cellulitis of unspecified toe: Secondary | ICD-10-CM | POA: Diagnosis not present

## 2012-09-19 DIAGNOSIS — L03019 Cellulitis of unspecified finger: Secondary | ICD-10-CM | POA: Diagnosis not present

## 2012-09-19 DIAGNOSIS — L080 Pyoderma: Secondary | ICD-10-CM | POA: Diagnosis not present

## 2012-09-19 DIAGNOSIS — D485 Neoplasm of uncertain behavior of skin: Secondary | ICD-10-CM | POA: Diagnosis not present

## 2012-09-19 DIAGNOSIS — B079 Viral wart, unspecified: Secondary | ICD-10-CM | POA: Diagnosis not present

## 2012-09-21 NOTE — Telephone Encounter (Signed)
Check on patient and see how she is doing. She can see the skin surgery center sooner if she would like. Otherwise, Dr. Terri Piedra has a longer wait.

## 2012-09-21 NOTE — Telephone Encounter (Signed)
Pt was seen at [facility] by Dr. Leta Speller on Tuesday and has f/u with him in ten days. Pt notes that a lesion began to grow on finger and was removed and sent to pathology by Dr. Londell Moh. Going back on methotrexate tonight.   Pt appreciative of efforts to get her seen sooner

## 2012-09-27 ENCOUNTER — Encounter: Payer: Self-pay | Admitting: Internal Medicine

## 2012-10-03 DIAGNOSIS — B079 Viral wart, unspecified: Secondary | ICD-10-CM | POA: Diagnosis not present

## 2012-10-06 ENCOUNTER — Encounter: Payer: Self-pay | Admitting: Gastroenterology

## 2012-10-12 ENCOUNTER — Ambulatory Visit (AMBULATORY_SURGERY_CENTER): Payer: Medicare Other | Admitting: *Deleted

## 2012-10-12 VITALS — Ht 67.0 in | Wt 172.0 lb

## 2012-10-12 DIAGNOSIS — Z1211 Encounter for screening for malignant neoplasm of colon: Secondary | ICD-10-CM

## 2012-10-12 DIAGNOSIS — Z860100 Personal history of colon polyps, unspecified: Secondary | ICD-10-CM

## 2012-10-12 MED ORDER — MOVIPREP 100 G PO SOLR: 1.0000 | Freq: Once | ORAL | Status: DC

## 2012-10-12 NOTE — Progress Notes (Signed)
No egg or soy allergy. ewm No problems with sedation or intubation. ewm 

## 2012-10-17 ENCOUNTER — Ambulatory Visit: Payer: Medicare Other | Admitting: Internal Medicine

## 2012-10-26 ENCOUNTER — Encounter: Payer: Medicare Other | Admitting: Gastroenterology

## 2012-11-10 ENCOUNTER — Ambulatory Visit (AMBULATORY_SURGERY_CENTER): Payer: Medicare Other | Admitting: Gastroenterology

## 2012-11-10 ENCOUNTER — Encounter: Payer: Self-pay | Admitting: Gastroenterology

## 2012-11-10 VITALS — BP 170/73 | HR 55 | Temp 97.6°F | Resp 22 | Ht 67.0 in | Wt 172.0 lb

## 2012-11-10 DIAGNOSIS — Z1211 Encounter for screening for malignant neoplasm of colon: Secondary | ICD-10-CM | POA: Diagnosis not present

## 2012-11-10 DIAGNOSIS — D126 Benign neoplasm of colon, unspecified: Secondary | ICD-10-CM

## 2012-11-10 DIAGNOSIS — M069 Rheumatoid arthritis, unspecified: Secondary | ICD-10-CM | POA: Diagnosis not present

## 2012-11-10 DIAGNOSIS — Z860100 Personal history of colon polyps, unspecified: Secondary | ICD-10-CM

## 2012-11-10 DIAGNOSIS — M810 Age-related osteoporosis without current pathological fracture: Secondary | ICD-10-CM | POA: Diagnosis not present

## 2012-11-10 DIAGNOSIS — K5730 Diverticulosis of large intestine without perforation or abscess without bleeding: Secondary | ICD-10-CM | POA: Diagnosis not present

## 2012-11-10 DIAGNOSIS — D569 Thalassemia, unspecified: Secondary | ICD-10-CM | POA: Diagnosis not present

## 2012-11-10 DIAGNOSIS — I10 Essential (primary) hypertension: Secondary | ICD-10-CM | POA: Diagnosis not present

## 2012-11-10 MED ORDER — SODIUM CHLORIDE 0.9 % IV SOLN: 500.0000 mL | INTRAVENOUS | Status: DC

## 2012-11-10 NOTE — Progress Notes (Signed)
Report to pacu rn, vss, bbs=clear 

## 2012-11-10 NOTE — Op Note (Signed)
[facility] [address] [place], [address]   COLONOSCOPY PROCEDURE REPORT  PATIENT: Bonnie, [name]  MR#: [id number] BIRTHDATE: [birth year] , 71  yrs. old GENDER: Female ENDOSCOPIST: Meryl Dare, MD, [facility] PROCEDURE DATE:  11/10/2012 PROCEDURE:   Colonoscopy with snare polypectomy ASA CLASS:   Class II INDICATIONS:Patient's personal history of adenomatous colon polyps.  MEDICATIONS: MAC sedation, administered by CRNA and propofol (Diprivan) 220mg  IV DESCRIPTION OF PROCEDURE:   After the risks benefits and alternatives of the procedure were thoroughly explained, informed consent was obtained.  A digital rectal exam revealed no abnormalities of the rectum.   The LB [id number]  endoscope was introduced through the anus and advanced to the cecum, which was identified by both the appendix and ileocecal valve. No adverse events experienced.   The quality of the prep was good, using MoviPrep  The instrument was then slowly withdrawn as the colon was fully examined.  COLON FINDINGS: A sessile polyp measuring 5 mm in size was found at the cecum.  A polypectomy was performed with a cold snare.  The resection was complete and the polyp tissue was completely retrieved.   Two sessile polyps measuring 7-8 mm in size were found in the transverse colon.  A polypectomy was performed with a cold snare.  The resection was complete and the polyp tissue was completely retrieved.   A semi-pedunculated polyp measuring 10 mm in size was found in the descending colon.  A polypectomy was performed with a cold snare.  The resection was complete and the polyp tissue was completely retrieved.   Mild diverticulosis was noted in the sigmoid colon.   The colon was otherwise normal. There was no diverticulosis, inflammation, polyps or cancers unless previously stated.  Retroflexed views revealed small internal hemorrhoids. The time to cecum=3 minutes 56 seconds.   Withdrawal time=11 minutes 44 seconds.  The scope was withdrawn and the procedure completed.  COMPLICATIONS: There were no complications.  ENDOSCOPIC IMPRESSION: 1.   Sessile polyp measuring 5 mm at the cecum; polypectomy performed with a cold snare 2.   Two sessile polyps measuring 7-8 mm in the transverse colon; polypectomy performed with a cold snare 3.   Semi-pedunculated polyp measuring 10 mm in the descending colon; polypectomy performed with a cold snare 4.   Mild diverticulosis was noted in the sigmoid colon 5.   Small internal hemorrhoids  RECOMMENDATIONS: 1.  Await pathology results 2.  High fiber diet with liberal fluid intake. 3.  Repeat Colonoscopy in 3 years.  eSigned:  Meryl Dare, MD, [facility] 11/10/2012 10:39 AM     PATIENT NAME:  Bonnie, [name] MR#: [id number]

## 2012-11-10 NOTE — Progress Notes (Signed)
Called to room to assist during endoscopic procedure.  Patient ID and intended procedure confirmed with present staff. Received instructions for my participation in the procedure from the performing physician. ewm 

## 2012-11-10 NOTE — Progress Notes (Signed)
Patient did not experience any of the following events: a burn prior to discharge; a fall within the facility; wrong site/side/patient/procedure/implant event; or a hospital transfer or hospital admission upon discharge from the facility. (G8907) Patient did not have preoperative order for IV antibiotic SSI prophylaxis. (G8918)  

## 2012-11-10 NOTE — Patient Instructions (Addendum)
YOU HAD AN ENDOSCOPIC PROCEDURE TODAY AT [facility]: Refer to the procedure report that was given to you for any specific questions about what was found during the examination.  If the procedure report does not answer your questions, please call your gastroenterologist to clarify.  If you requested that your care partner not be given the details of your procedure findings, then the procedure report has been included in a sealed envelope for you to review at your convenience later.  YOU SHOULD EXPECT: Some feelings of bloating in the abdomen. Passage of more gas than usual.  Walking can help get rid of the air that was put into your GI tract during the procedure and reduce the bloating. If you had a lower endoscopy (such as a colonoscopy or flexible sigmoidoscopy) you may notice spotting of blood in your stool or on the toilet paper. If you underwent a bowel prep for your procedure, then you may not have a normal bowel movement for a few days.  DIET: Your first meal following the procedure should be a light meal and then it is ok to progress to your normal diet.  A half-sandwich or bowl of soup is an example of a good first meal.  Heavy or fried foods are harder to digest and may make you feel nauseous or bloated.  Likewise meals heavy in dairy and vegetables can cause extra gas to form and this can also increase the bloating.  Drink plenty of fluids but you should avoid alcoholic beverages for 24 hours.  ACTIVITY: Your care partner should take you home directly after the procedure.  You should plan to take it easy, moving slowly for the rest of the day.  You can resume normal activity the day after the procedure however you should NOT DRIVE or use heavy machinery for 24 hours (because of the sedation medicines used during the test).    SYMPTOMS TO REPORT IMMEDIATELY: A gastroenterologist can be reached at any hour.  During normal business hours, 8:30 AM to 5:00 PM Monday through Friday,  call [phone].  After hours and on weekends, please call the GI answering service at [phone] who will take a message and have the physician on call contact you.   Following lower endoscopy (colonoscopy or flexible sigmoidoscopy):  Excessive amounts of blood in the stool  Significant tenderness or worsening of abdominal pains  Swelling of the abdomen that is new, acute  Fever of 100F or higher s  FOLLOW UP: If any biopsies were taken you will be contacted by phone or by letter within the next 1-3 weeks.  Call your gastroenterologist if you have not heard about the biopsies in 3 weeks.  Our staff will call the home number listed on your records the next business day following your procedure to check on you and address any questions or concerns that you may have at that time regarding the information given to you following your procedure. This is a courtesy call and so if there is no answer at the home number and we have not heard from you through the emergency physician on call, we will assume that you have returned to your regular daily activities without incident.  SIGNATURES/CONFIDENTIALITY: You and/or your care partner have signed paperwork which will be entered into your electronic medical record.  These signatures attest to the fact that that the information above on your After Visit Summary has been reviewed and is understood.  Full responsibility of the confidentiality of  this discharge information lies with you and/or your care-partner.  Polyp, diverticulosis, high fiber diet information, and hemorrhoid information given.  Next colonoscopy 3 years-2017

## 2012-11-13 ENCOUNTER — Telehealth: Payer: Self-pay | Admitting: *Deleted

## 2012-11-13 NOTE — Telephone Encounter (Signed)
  Follow up Call-  Call back number 11/10/2012  Post procedure Call Back phone  # [phone]  Permission to leave phone message Yes     Patient questions:  Do you have a fever, pain , or abdominal swelling? no Pain Score  0 *  Have you tolerated food without any problems? yes  Have you been able to return to your normal activities? yes  Do you have any questions about your discharge instructions: Diet   yes Medications  yes Follow up visit  yes  Do you have questions or concerns about your Care? no  Actions: * If pain score is 4 or above: No action needed, pain <4.

## 2012-11-15 ENCOUNTER — Encounter: Payer: Self-pay | Admitting: Gastroenterology

## 2012-11-20 ENCOUNTER — Telehealth: Payer: Self-pay | Admitting: *Deleted

## 2012-11-20 ENCOUNTER — Encounter: Payer: Self-pay | Admitting: Family Medicine

## 2012-11-20 ENCOUNTER — Ambulatory Visit (INDEPENDENT_AMBULATORY_CARE_PROVIDER_SITE_OTHER): Payer: Medicare Other | Admitting: Family Medicine

## 2012-11-20 ENCOUNTER — Telehealth: Payer: Self-pay | Admitting: Internal Medicine

## 2012-11-20 ENCOUNTER — Encounter: Payer: Self-pay | Admitting: Internal Medicine

## 2012-11-20 VITALS — BP 126/82 | HR 64 | Temp 97.9°F | Wt 170.0 lb

## 2012-11-20 DIAGNOSIS — M25561 Pain in right knee: Secondary | ICD-10-CM

## 2012-11-20 DIAGNOSIS — M25569 Pain in unspecified knee: Secondary | ICD-10-CM | POA: Diagnosis not present

## 2012-11-20 NOTE — Telephone Encounter (Signed)
Left message on machine returning patient's call to schedule an appointment

## 2012-11-20 NOTE — Telephone Encounter (Signed)
Patient Information:  Caller Name: Chantell  Phone: [phone]  Patient: Bonnie [name], Bonnie [name]  Gender: Female  DOB: 27-Aug-1941  Age: 72 Years  PCP: Darryll Capers (Adults only)  Office Follow Up:  Does the office need to follow up with this patient?: No  Instructions For The Office: N/A  RN Note:  Knee has been popping in and out for the past week with increased frequency.  Last night it locked on her and husband assisted it going back in joint.  When it happens pain is 10/10.  Currently no changes noted, but there is pain on the side of the knee toward the back of knee-more of a soreness.  No emergent symptoms noted.  Care advice given with appointment made with Dr. Selena Batten at 16:00.  No available appointments for acute/same day visits with patient's PCP.  Symptoms  Reason For Call & Symptoms: Knee is popping in and out of joint  Reviewed Health History In EMR: Yes  Reviewed Medications In EMR: Yes  Reviewed Allergies In EMR: Yes  Reviewed Surgeries / Procedures: Yes  Date of Onset of Symptoms: 11/17/2012  Guideline(s) Used:  Knee Pain  Disposition Per Guideline:   See Within 3 Days in Office  Reason For Disposition Reached:   Moderate pain (e.g., symptoms interfere with work or school, limping) and present > 3 days  Advice Given:  Call Back If:  You become worse.  Appointment Scheduled:  11/20/2012 16:00:52 Appointment Scheduled Provider:  Kriste Basque [facility])

## 2012-11-20 NOTE — Progress Notes (Signed)
Chief Complaint  Patient presents with  . acute knee pain    pt states her kneecap keeps popping out (3 times within a week) leaving going out of town [place]    HPI:  Acute visit for knee pain: -has felt like knee popped and caught a few times in last week -initially happened a few months ago when twisting knee while getting in a car -reports poppin/clicking sound and catching with pain in R knee -denies any recent trauma, twisting, running, dancing, gymnastics -denies: fevers, weakness, numbness, giving away, clicking -feels fine after the events - mild lateral joint line pain occ -always occurs with twisting and flexing knee  ROS: See pertinent positives and negatives per HPI.  Past Medical History  Diagnosis Date  . Hyperlipidemia   . Thyroid lump   . Night sweats   . Fatigue   . Bruises easily   . Colon polyp   . Reflux   . Hemorrhoids   . Anemia   . Wears glasses   . Sinus problem   . Arthritis     muscle weaknes at times  . Menopause   . Thyroid disease   . Hypertension     Family History  Problem Relation Age of Onset  . Colon cancer Neg Hx   . Esophageal cancer Neg Hx   . Stomach cancer Neg Hx   . Rectal cancer Neg Hx   . Hypertension Mother     History   Social History  . Marital Status: Married    Spouse Name: N/A    Number of Children: N/A  . Years of Education: N/A   Social History Main Topics  . Smoking status: Former Smoker -- 0.50 packs/day for 10 years    Types: Cigarettes    Quit date: 11/19/1973  . Smokeless tobacco: Never Used  . Alcohol Use: Yes     Comment: very rare glass of wine  . Drug Use: No  . Sexually Active: Yes   Other Topics Concern  . None   Social History Narrative  . None    Current outpatient prescriptions:aspirin 81 MG tablet, Take 81 mg by mouth daily.  , Disp: , Rfl: ;  Calcium Citrate-Vitamin D (CALCIUM CITRATE + D) 250-200 MG-UNIT TABS, Take 2 tablets by mouth. , Disp: , Rfl: ;  celecoxib (CELEBREX) 200 MG  capsule, Take 200 mg by mouth daily as needed., Disp: , Rfl: ;  Cyanocobalamin (B-12) 1000 MCG LOZG, Take 1 lozenge by mouth daily., Disp: , Rfl:  diphenhydrAMINE (BENADRYL) 25 MG tablet, Take 25 mg by mouth at bedtime as needed., Disp: , Rfl: ;  Estradiol (VAGIFEM) 10 MCG TABS, Place vaginally. One tab per week , Disp: , Rfl: ;  fish oil-omega-3 fatty acids 1000 MG capsule, Take 2 g by mouth daily.  , Disp: , Rfl: ;  folic acid (FOLVITE) 1 MG tablet, Take 2 mg by mouth daily. , Disp: , Rfl:  levothyroxine (SYNTHROID, LEVOTHROID) 112 MCG tablet, Take 1 tablet (112 mcg total) by mouth daily., Disp: 90 tablet, Rfl: 3;  methotrexate (RHEUMATREX) 2.5 MG tablet, Take 5 tablets (12.5 mg total) by mouth once a week., Disp: 5 tablet, Rfl: ;  Multiple Vitamin (MULTIVITAMIN PO), Take by mouth daily.  , Disp: , Rfl: ;  mupirocin ointment (BACTROBAN) 2 %, Apply topically 3 (three) times daily., Disp: 22 g, Rfl: 0 Red Yeast Rice 600 MG CAPS, Take 1 capsule (600 mg total) by mouth daily., Disp: 30 capsule, Rfl: 11  EXAM:  Filed Vitals:   11/20/12 1605  BP: 126/82  Pulse: 64  Temp: 97.9 F (36.6 C)    Body mass index is 26.62 kg/(m^2).  GENERAL: vitals reviewed and listed above, alert, oriented, appears well hydrated and in no acute distress  HEENT: atraumatic, conjunttiva clear, no obvious abnormalities on inspection of external nose and ears  NECK: no obvious masses on inspection  LUNGS: clear to auscultation bilaterally, no wheezes, rales or rhonchi, good air movement  CV: HRRR, no peripheral edema  MS: moves all extremities without noticeable abnormality Normal gait Normal inspection of both knees, no obvious swelling, erythema or mild lateral rotation of L foot No effusion TTP: R lateral joint line Neg: lachman's, ant/post drawer, pat apprehension test, moving patellar apprehension test Positive: petallar crepitus mild bilat, pain in joint line with McMurrays test  PSYCH: pleasant and  cooperative, no obvious depression or anxiety  ASSESSMENT AND PLAN:  Discussed the following assessment and plan:  Knee pain, acute, right - Plan: Ambulatory referral to Orthopedic Surgery  -from description of pain and exam query possible meniscal issue -referral placed to ortho for further evaluation per her request -Patient advised to return or notify a doctor immediately if symptoms worsen or persist or new concerns arise.  There are no Patient Instructions on file for this visit.   Kriste Basque R.

## 2012-11-21 ENCOUNTER — Telehealth: Payer: Self-pay

## 2012-11-21 DIAGNOSIS — M629 Disorder of muscle, unspecified: Secondary | ICD-10-CM | POA: Diagnosis not present

## 2012-11-21 NOTE — Telephone Encounter (Signed)
VM:  Pt called and states that she called [facility] and got an appt for 11/21/12.  Pt was calling the office to make aware so that referral would not be done.  Spoke with Joni Reining [facility]) and she is aware.   Returned pt's call and left a voicemail that message had been received.

## 2012-11-22 DIAGNOSIS — M069 Rheumatoid arthritis, unspecified: Secondary | ICD-10-CM | POA: Diagnosis not present

## 2012-11-22 DIAGNOSIS — M25569 Pain in unspecified knee: Secondary | ICD-10-CM | POA: Diagnosis not present

## 2012-11-29 DIAGNOSIS — M659 Synovitis and tenosynovitis, unspecified: Secondary | ICD-10-CM | POA: Diagnosis not present

## 2012-12-04 ENCOUNTER — Ambulatory Visit (INDEPENDENT_AMBULATORY_CARE_PROVIDER_SITE_OTHER): Payer: Medicare Other | Admitting: Internal Medicine

## 2012-12-04 ENCOUNTER — Encounter: Payer: Self-pay | Admitting: Internal Medicine

## 2012-12-04 VITALS — BP 132/68 | HR 68 | Temp 98.0°F | Wt 170.0 lb

## 2012-12-04 DIAGNOSIS — T887XXA Unspecified adverse effect of drug or medicament, initial encounter: Secondary | ICD-10-CM | POA: Diagnosis not present

## 2012-12-04 DIAGNOSIS — I10 Essential (primary) hypertension: Secondary | ICD-10-CM | POA: Diagnosis not present

## 2012-12-04 DIAGNOSIS — E785 Hyperlipidemia, unspecified: Secondary | ICD-10-CM

## 2012-12-04 DIAGNOSIS — M659 Synovitis and tenosynovitis, unspecified: Secondary | ICD-10-CM | POA: Diagnosis not present

## 2012-12-04 DIAGNOSIS — E039 Hypothyroidism, unspecified: Secondary | ICD-10-CM | POA: Diagnosis not present

## 2012-12-04 LAB — HEPATIC FUNCTION PANEL
AST: 25 U/L (ref 0–37)
Albumin: 3.8 g/dL (ref 3.5–5.2)
Alkaline Phosphatase: 56 U/L (ref 39–117)
Total Protein: 7.3 g/dL (ref 6.0–8.3)

## 2012-12-04 LAB — LIPID PANEL
Cholesterol: 221 mg/dL — ABNORMAL HIGH (ref 0–200)
HDL: 61.1 mg/dL (ref >39.00)
VLDL: 14.4 mg/dL (ref 0.0–40.0)

## 2012-12-04 LAB — BASIC METABOLIC PANEL
BUN: 13 mg/dL (ref 6–23)
Chloride: 103 mEq/L (ref 96–112)
Creatinine, Ser: 0.6 mg/dL (ref 0.4–1.2)
Glucose, Bld: 92 mg/dL (ref 70–99)
Potassium: 3.7 mEq/L (ref 3.5–5.1)

## 2012-12-04 LAB — TSH: TSH: 0.56 u[IU]/mL (ref 0.35–5.50)

## 2012-12-04 MED ORDER — LEVOTHYROXINE SODIUM 112 MCG PO TABS: 112.0000 ug | ORAL_TABLET | Freq: Every day | ORAL | Status: DC

## 2012-12-04 NOTE — Patient Instructions (Signed)
The patient is instructed to continue all medications as prescribed. Schedule followup with check out clerk upon leaving the clinic  

## 2012-12-04 NOTE — Progress Notes (Signed)
Subjective:    Patient ID: Bonnie [name], female    DOB: 02-13-1941, 72 y.o.   MRN: [id number]  HPI Monitoring diet Discussing weight loss stratigies   Review of Systems  Constitutional: Negative for activity change, appetite change and fatigue.  HENT: Negative for ear pain, congestion, neck pain, postnasal drip and sinus pressure.   Eyes: Negative for redness and visual disturbance.  Respiratory: Negative for cough, shortness of breath and wheezing.   Gastrointestinal: Negative for abdominal pain and abdominal distention.  Genitourinary: Negative for dysuria, frequency and menstrual problem.  Musculoskeletal: Negative for myalgias, joint swelling and arthralgias.  Skin: Negative for rash and wound.  Neurological: Negative for dizziness, weakness and headaches.  Hematological: Negative for adenopathy. Does not bruise/bleed easily.  Psychiatric/Behavioral: Negative for sleep disturbance and decreased concentration.   Past Medical History  Diagnosis Date  . Hyperlipidemia   . Thyroid lump   . Night sweats   . Fatigue   . Bruises easily   . Colon polyp   . Reflux   . Hemorrhoids   . Anemia   . Wears glasses   . Sinus problem   . Arthritis     muscle weaknes at times  . Menopause   . Thyroid disease   . Hypertension     History   Social History  . Marital Status: Married    Spouse Name: N/A    Number of Children: N/A  . Years of Education: N/A   Occupational History  . Not on file.   Social History Main Topics  . Smoking status: Former Smoker -- 0.50 packs/day for 10 years    Types: Cigarettes    Quit date: 11/19/1973  . Smokeless tobacco: Never Used  . Alcohol Use: Yes     Comment: very rare glass of wine  . Drug Use: No  . Sexually Active: Yes   Other Topics Concern  . Not on file   Social History Narrative  . No narrative on file    Past Surgical History  Procedure Laterality Date  . Abdominal hysterectomy    . Cholecystectomy    .  Thyroidectomy    . Colonoscopy    . Polypectomy    . Upper gastrointestinal endoscopy      Family History  Problem Relation Age of Onset  . Colon cancer Neg Hx   . Esophageal cancer Neg Hx   . Stomach cancer Neg Hx   . Rectal cancer Neg Hx   . Hypertension Mother     Allergies  Allergen Reactions  . Neosporin (Neomycin-Bacitracin Zn-Polymyx) Other (See Comments)    Patient states that wounds or scratches never heal.    Current Outpatient Prescriptions on File Prior to Visit  Medication Sig Dispense Refill  . aspirin 81 MG tablet Take 81 mg by mouth daily.        . Calcium Citrate-Vitamin D (CALCIUM CITRATE + D) 250-200 MG-UNIT TABS Take 2 tablets by mouth.       . celecoxib (CELEBREX) 200 MG capsule Take 200 mg by mouth daily as needed.      . Cyanocobalamin (B-12) 1000 MCG LOZG Take 1 lozenge by mouth daily.      . diphenhydrAMINE (BENADRYL) 25 MG tablet Take 25 mg by mouth at bedtime as needed.      . Estradiol (VAGIFEM) 10 MCG TABS Place vaginally. One tab per week       . fish oil-omega-3 fatty acids 1000 MG capsule Take 2 g by mouth  daily.        . folic acid (FOLVITE) 1 MG tablet Take 2 mg by mouth daily.       Marland Kitchen levothyroxine (SYNTHROID, LEVOTHROID) 112 MCG tablet Take 1 tablet (112 mcg total) by mouth daily.  90 tablet  3  . methotrexate (RHEUMATREX) 2.5 MG tablet Take 5 tablets (12.5 mg total) by mouth once a week.  5 tablet    . Multiple Vitamin (MULTIVITAMIN PO) Take by mouth daily.        . mupirocin ointment (BACTROBAN) 2 % Apply topically 3 (three) times daily.  22 g  0  . Red Yeast Rice 600 MG CAPS Take 1 capsule (600 mg total) by mouth daily.  30 capsule  11   No current facility-administered medications on file prior to visit.    BP 132/68  Pulse 68  Temp(Src) 98 F (36.7 C) (Oral)  Wt 170 lb (77.111 kg)  BMI 26.62 kg/m2       Objective:   Physical Exam  Nursing note and vitals reviewed. Constitutional: She is oriented to person, place, and time.  She appears well-developed and well-nourished. No distress.  HENT:  Head: Normocephalic and atraumatic.  Right Ear: External ear normal.  Left Ear: External ear normal.  Nose: Nose normal.  Mouth/Throat: Oropharynx is clear and moist.  Eyes: Conjunctivae and EOM are normal. Pupils are equal, round, and reactive to light.  Neck: Normal range of motion. Neck supple. No JVD present. No tracheal deviation present. No thyromegaly present.  Cardiovascular: Normal rate, regular rhythm, normal heart sounds and intact distal pulses.   No murmur heard. Pulmonary/Chest: Effort normal and breath sounds normal. She has no wheezes. She exhibits no tenderness.  Abdominal: Soft. Bowel sounds are normal.  Musculoskeletal: Normal range of motion. She exhibits no edema and no tenderness.  Lymphadenopathy:    She has no cervical adenopathy.  Neurological: She is alert and oriented to person, place, and time. She has normal reflexes. No cranial nerve deficit.  Skin: Skin is warm and dry. She is not diaphoretic.  Psychiatric: She has a normal mood and affect. Her behavior is normal.          Assessment & Plan:  Discussion of diet Discussion of calcium supplementation Need synthroid check and refill Lipid monoitoring

## 2012-12-05 ENCOUNTER — Other Ambulatory Visit: Payer: Self-pay | Admitting: *Deleted

## 2012-12-05 ENCOUNTER — Encounter: Payer: Self-pay | Admitting: Internal Medicine

## 2012-12-05 DIAGNOSIS — E039 Hypothyroidism, unspecified: Secondary | ICD-10-CM

## 2012-12-05 MED ORDER — LEVOTHYROXINE SODIUM 112 MCG PO TABS: ORAL_TABLET | ORAL | Status: DC

## 2012-12-05 NOTE — Telephone Encounter (Signed)
[employer] insurance is sent to [facility].

## 2012-12-05 NOTE — Telephone Encounter (Signed)
I just tried to send your prescription to [facility] and that is not on out list.  Please check  You insurance and make sure that is where it should be sent.  No one here has ever heard of that.  I called your company and your medications come from [facility].  So when you  have meds ordered from other mds you will tell them to send to [facility]

## 2012-12-05 NOTE — Telephone Encounter (Signed)
ok 

## 2012-12-06 ENCOUNTER — Encounter: Payer: Self-pay | Admitting: Internal Medicine

## 2012-12-06 ENCOUNTER — Encounter: Payer: Self-pay | Admitting: *Deleted

## 2012-12-06 ENCOUNTER — Other Ambulatory Visit: Payer: Self-pay | Admitting: *Deleted

## 2012-12-06 DIAGNOSIS — E039 Hypothyroidism, unspecified: Secondary | ICD-10-CM

## 2012-12-06 MED ORDER — LEVOTHYROXINE SODIUM 112 MCG PO TABS: 112.0000 ug | ORAL_TABLET | Freq: Every day | ORAL | Status: DC

## 2012-12-07 DIAGNOSIS — M659 Synovitis and tenosynovitis, unspecified: Secondary | ICD-10-CM | POA: Diagnosis not present

## 2012-12-12 DIAGNOSIS — M659 Synovitis and tenosynovitis, unspecified: Secondary | ICD-10-CM | POA: Diagnosis not present

## 2012-12-14 DIAGNOSIS — M659 Synovitis and tenosynovitis, unspecified: Secondary | ICD-10-CM | POA: Diagnosis not present

## 2012-12-14 DIAGNOSIS — M629 Disorder of muscle, unspecified: Secondary | ICD-10-CM | POA: Diagnosis not present

## 2012-12-18 DIAGNOSIS — M659 Synovitis and tenosynovitis, unspecified: Secondary | ICD-10-CM | POA: Diagnosis not present

## 2012-12-21 DIAGNOSIS — M659 Synovitis and tenosynovitis, unspecified: Secondary | ICD-10-CM | POA: Diagnosis not present

## 2012-12-25 DIAGNOSIS — M659 Synovitis and tenosynovitis, unspecified: Secondary | ICD-10-CM | POA: Diagnosis not present

## 2012-12-28 DIAGNOSIS — M659 Synovitis and tenosynovitis, unspecified: Secondary | ICD-10-CM | POA: Diagnosis not present

## 2013-01-09 DIAGNOSIS — M659 Synovitis and tenosynovitis, unspecified: Secondary | ICD-10-CM | POA: Diagnosis not present

## 2013-01-11 DIAGNOSIS — M659 Synovitis and tenosynovitis, unspecified: Secondary | ICD-10-CM | POA: Diagnosis not present

## 2013-01-17 DIAGNOSIS — M659 Synovitis and tenosynovitis, unspecified: Secondary | ICD-10-CM | POA: Diagnosis not present

## 2013-01-22 DIAGNOSIS — M069 Rheumatoid arthritis, unspecified: Secondary | ICD-10-CM | POA: Diagnosis not present

## 2013-01-22 DIAGNOSIS — M659 Synovitis and tenosynovitis, unspecified: Secondary | ICD-10-CM | POA: Diagnosis not present

## 2013-01-24 DIAGNOSIS — M629 Disorder of muscle, unspecified: Secondary | ICD-10-CM | POA: Diagnosis not present

## 2013-02-09 DIAGNOSIS — M659 Synovitis and tenosynovitis, unspecified: Secondary | ICD-10-CM | POA: Diagnosis not present

## 2013-02-13 DIAGNOSIS — M659 Synovitis and tenosynovitis, unspecified: Secondary | ICD-10-CM | POA: Diagnosis not present

## 2013-02-20 DIAGNOSIS — M659 Synovitis and tenosynovitis, unspecified: Secondary | ICD-10-CM | POA: Diagnosis not present

## 2013-03-26 DIAGNOSIS — M79609 Pain in unspecified limb: Secondary | ICD-10-CM | POA: Diagnosis not present

## 2013-03-26 DIAGNOSIS — Z1231 Encounter for screening mammogram for malignant neoplasm of breast: Secondary | ICD-10-CM | POA: Diagnosis not present

## 2013-03-26 DIAGNOSIS — N8110 Cystocele, unspecified: Secondary | ICD-10-CM | POA: Diagnosis not present

## 2013-03-26 DIAGNOSIS — N951 Menopausal and female climacteric states: Secondary | ICD-10-CM | POA: Diagnosis not present

## 2013-03-26 DIAGNOSIS — M069 Rheumatoid arthritis, unspecified: Secondary | ICD-10-CM | POA: Diagnosis not present

## 2013-03-29 ENCOUNTER — Other Ambulatory Visit: Payer: Self-pay | Admitting: Obstetrics & Gynecology

## 2013-03-29 DIAGNOSIS — R928 Other abnormal and inconclusive findings on diagnostic imaging of breast: Secondary | ICD-10-CM

## 2013-04-10 ENCOUNTER — Ambulatory Visit
Admission: RE | Admit: 2013-04-10 | Discharge: 2013-04-10 | Disposition: A | Discharge status: Home / Self Care | Payer: Medicare Other | Source: Ambulatory Visit | Attending: Obstetrics & Gynecology | Admitting: Obstetrics & Gynecology

## 2013-04-10 DIAGNOSIS — N63 Unspecified lump in unspecified breast: Secondary | ICD-10-CM | POA: Diagnosis not present

## 2013-04-10 DIAGNOSIS — R928 Other abnormal and inconclusive findings on diagnostic imaging of breast: Secondary | ICD-10-CM

## 2013-04-25 ENCOUNTER — Ambulatory Visit (INDEPENDENT_AMBULATORY_CARE_PROVIDER_SITE_OTHER): Payer: Medicare Other | Admitting: Family Medicine

## 2013-04-25 ENCOUNTER — Encounter: Payer: Self-pay | Admitting: Family Medicine

## 2013-04-25 ENCOUNTER — Telehealth: Payer: Self-pay | Admitting: Internal Medicine

## 2013-04-25 VITALS — BP 130/66 | HR 66 | Temp 97.6°F | Wt 167.2 lb

## 2013-04-25 DIAGNOSIS — L259 Unspecified contact dermatitis, unspecified cause: Secondary | ICD-10-CM

## 2013-04-25 DIAGNOSIS — L309 Dermatitis, unspecified: Secondary | ICD-10-CM | POA: Insufficient documentation

## 2013-04-25 MED ORDER — TRIAMCINOLONE ACETONIDE 0.1 % EX CREA: TOPICAL_CREAM | Freq: Two times a day (BID) | CUTANEOUS | Status: DC

## 2013-04-25 NOTE — Progress Notes (Signed)
OFFICE NOTE  04/25/2013  CC:  Chief Complaint  Patient presents with  . Rash     HPI: Patient is a 72 y.o. Caucasian female who is here for rash. Patient's husband with itchy rash 1 mo ago--dx'd with pityriasis rosea and told to take benadryl. Itching worsened so he was then rx'd triamcinolone cream.  His rash is slowly resolving.  Around 2 weeks later, pt developed same type rash: described as little itchy red bumps that were barely palpable.  Started on her stomach, spread up her trunk and then to her back, some on arms.  None on hands or LE's.  No hives, no vesicles.   She has felt no systemic symptoms and denies any illnesses except a 1 day period of ST/nasal congest/PND after she had already had the rash for about a week.  No new meds recently.    Pertinent PMH:  Past Medical History  Diagnosis Date  . Hyperlipidemia   . Thyroid lump   . Night sweats   . Fatigue   . Bruises easily   . Colon polyp   . Reflux   . Hemorrhoids   . Anemia   . Wears glasses   . Sinus problem   . Rheumatoid arthritis 2007    muscle weaknes at times (Dr. Dierdre Forth)  . Menopause   . Thyroid disease   . Hypertension    Past surgical and family history reviewed and no changes noted since last office visit.  MEDS:  Outpatient Prescriptions Prior to Visit  Medication Sig Dispense Refill  . aspirin 81 MG tablet Take 81 mg by mouth daily.        . Calcium Citrate-Vitamin D (CALCIUM CITRATE + D) 250-200 MG-UNIT TABS Take 2 tablets by mouth.       . celecoxib (CELEBREX) 200 MG capsule Take 200 mg by mouth daily as needed.      . Cyanocobalamin (B-12) 1000 MCG LOZG Take 1 lozenge by mouth daily.      . diphenhydrAMINE (BENADRYL) 25 MG tablet Take 25 mg by mouth at bedtime as needed.      . Estradiol (VAGIFEM) 10 MCG TABS Place vaginally. One tab per week       . fish oil-omega-3 fatty acids 1000 MG capsule Take 2 g by mouth daily.        . folic acid (FOLVITE) 1 MG tablet Take 2 mg by mouth daily.        Marland Kitchen levothyroxine (SYNTHROID, LEVOTHROID) 112 MCG tablet Take 1 tablet (112 mcg total) by mouth daily. BRANDED NAME ONLY--NO GENERIC OR SUBSTITUTIONS  90 tablet  3  . methotrexate (RHEUMATREX) 2.5 MG tablet Take 5 tablets (12.5 mg total) by mouth once a week.  5 tablet    . Multiple Vitamin (MULTIVITAMIN PO) Take by mouth daily.        . mupirocin ointment (BACTROBAN) 2 % Apply topically 3 (three) times daily.  22 g  0  . Red Yeast Rice 600 MG CAPS Take 1 capsule (600 mg total) by mouth daily.  30 capsule  11   No facility-administered medications prior to visit.    PE: Blood pressure 130/66, pulse 66, temperature 97.6 F (36.4 C), temperature source Oral, weight 167 lb 4 oz (75.864 kg), SpO2 94.00%. Gen: Alert, well appearing.  Patient is oriented to person, place, time, and situation. Eyes: no injection, swelling, or exudate. Mouth: no lesions. SKIN: No pallor or jaundice.   There are tiny pinkish,blanchable papules and macules (about  1mm) scattered sparsely on her lower abdomen, back, and arms.  No excoriations or ulcerations.  No vesicles, no petechiae, no pustules, no patches, no hives.   IMPRESSION AND PLAN: Nonspecific dermatitis, unknown etiology.  Doubt infection.  This does not appear consistent with pityriasis rosea (the dx her husband was given).  Reassured patient, likely self limited process.  Will treat with triamcinolone 0.1% cream.  Allegra OTC prn itching, may add benadryl qhs prn. Signs/symptoms to call or return for were reviewed and pt expressed understanding. Consider punch biopsy if rash persists or worsens despite treatment.  FOLLOW UP: prn

## 2013-04-25 NOTE — Telephone Encounter (Signed)
Patient Information:  Caller Name: Elverna  Phone: [phone]  Patient: Bonnie [name], Bonnie [name]  Gender: Female  DOB: 21-Nov-1940  Age: 72 Years  PCP: Darryll Capers (Adults only)  Office Follow Up:  Does the office need to follow up with this patient?: No  Instructions For The Office: N/A  RN Note:  No appointments remain at [facility]; scheduled at 1300 04/25/13 with Dr Milinda Cave at [facility] office.    Symptoms  Reason For Call & Symptoms: Itchy, papular, rash on abdomen, back, right arm and right thigh.  Husband diagnosed with non-contagious rash.  Reviewed Health History In EMR: Yes  Reviewed Medications In EMR: Yes  Reviewed Allergies In EMR: Yes  Reviewed Surgeries / Procedures: Yes  Date of Onset of Symptoms: 04/07/2013  Treatments Tried: Triamcinalone lotion, Aveno shampoo, bodywash, Benadryl  Treatments Tried Worked: No  Guideline(s) Used:  Rash or Redness - Widespread  Disposition Per Guideline:   See Today in Office  Reason For Disposition Reached:   Severe itching  Advice Given:  Reassurance:  There are many causes of widespread rashes and most of the time they are not serious. Common causes include viral illness (e.g., cold viruses) and allergic reactions (to a food, medicine, or environmental exposure).  For Itchy Rashes:  Wash the skin once with gentle non-perfumed soap to remove any irritants. Rinse the soap off thoroughly.  You may also take an oatmeal (Aveeno) bath or take an antihistamine medication by mouth to help reduce the itching.  Oral Antihistamine Medication for Itching:  Take an antihistamine like diphenhydramine (Benadryl) for widespread rashes that itch. The adult dosage of Benadryl is 25-50 mg by mouth 4 times daily.  An over-the-counter antihistamine that causes less sleepiness is loratadine (e.g., Alavert or Claritin).  CAUTION: This type of medication may cause sleepiness. Do not drink alcohol, drive, or operate dangerous machinery while taking  antihistamines. Do not take these medications if you have prostate problems.  Contagiousness:  Avoid contact with pregnant women until a diagnosis is made. Most viral rashes are contagious (especially if a fever is present). You can return to work or school after the rash is gone or when your doctor says it is safe to return with the rash.  Call Back If:   You become worse  Patient Will Follow Care Advice:  YES

## 2013-04-25 NOTE — Telephone Encounter (Signed)
FYI

## 2013-04-26 ENCOUNTER — Telehealth: Payer: Self-pay | Admitting: Family Medicine

## 2013-04-26 MED ORDER — TRIAMCINOLONE ACETONIDE 0.1 % EX CREA: TOPICAL_CREAM | Freq: Two times a day (BID) | CUTANEOUS | Status: DC

## 2013-04-26 NOTE — Telephone Encounter (Signed)
Patient states that the triamcinolone cream that you prescribed yesterday was only two small tubes 15 grams.  She wants to know if you can give her more cream because rash is all over her body and she doesn't believe she will have enough.  Please advise.

## 2013-04-26 NOTE — Telephone Encounter (Signed)
Rx sent for 454g jar.

## 2013-05-07 ENCOUNTER — Other Ambulatory Visit: Payer: Self-pay | Admitting: *Deleted

## 2013-05-07 ENCOUNTER — Encounter: Payer: Self-pay | Admitting: Internal Medicine

## 2013-05-07 MED ORDER — FLUCONAZOLE 100 MG PO TABS: 100.0000 mg | ORAL_TABLET | Freq: Every day | ORAL | Status: DC

## 2013-05-07 MED ORDER — METHYLPREDNISOLONE (PAK) 4 MG PO TABS: ORAL_TABLET | ORAL | Status: DC

## 2013-05-08 DIAGNOSIS — R21 Rash and other nonspecific skin eruption: Secondary | ICD-10-CM | POA: Diagnosis not present

## 2013-05-08 DIAGNOSIS — B86 Scabies: Secondary | ICD-10-CM | POA: Diagnosis not present

## 2013-05-28 ENCOUNTER — Other Ambulatory Visit: Payer: Self-pay | Admitting: Dermatology

## 2013-05-28 DIAGNOSIS — D2370 Other benign neoplasm of skin of unspecified lower limb, including hip: Secondary | ICD-10-CM | POA: Diagnosis not present

## 2013-05-28 DIAGNOSIS — L723 Sebaceous cyst: Secondary | ICD-10-CM | POA: Diagnosis not present

## 2013-05-28 DIAGNOSIS — L738 Other specified follicular disorders: Secondary | ICD-10-CM | POA: Diagnosis not present

## 2013-05-28 DIAGNOSIS — D1801 Hemangioma of skin and subcutaneous tissue: Secondary | ICD-10-CM | POA: Diagnosis not present

## 2013-05-28 DIAGNOSIS — D235 Other benign neoplasm of skin of trunk: Secondary | ICD-10-CM | POA: Diagnosis not present

## 2013-05-28 DIAGNOSIS — D485 Neoplasm of uncertain behavior of skin: Secondary | ICD-10-CM | POA: Diagnosis not present

## 2013-05-28 DIAGNOSIS — L820 Inflamed seborrheic keratosis: Secondary | ICD-10-CM | POA: Diagnosis not present

## 2013-05-28 DIAGNOSIS — L821 Other seborrheic keratosis: Secondary | ICD-10-CM | POA: Diagnosis not present

## 2013-06-05 DIAGNOSIS — M069 Rheumatoid arthritis, unspecified: Secondary | ICD-10-CM | POA: Diagnosis not present

## 2013-06-12 DIAGNOSIS — E039 Hypothyroidism, unspecified: Secondary | ICD-10-CM | POA: Diagnosis not present

## 2013-06-12 DIAGNOSIS — E785 Hyperlipidemia, unspecified: Secondary | ICD-10-CM | POA: Diagnosis not present

## 2013-06-12 DIAGNOSIS — Z23 Encounter for immunization: Secondary | ICD-10-CM | POA: Diagnosis not present

## 2013-06-12 DIAGNOSIS — E538 Deficiency of other specified B group vitamins: Secondary | ICD-10-CM | POA: Diagnosis not present

## 2013-06-12 DIAGNOSIS — E559 Vitamin D deficiency, unspecified: Secondary | ICD-10-CM | POA: Diagnosis not present

## 2013-06-20 ENCOUNTER — Encounter: Payer: Medicare Other | Admitting: Internal Medicine

## 2013-06-25 DIAGNOSIS — L538 Other specified erythematous conditions: Secondary | ICD-10-CM | POA: Diagnosis not present

## 2013-06-25 DIAGNOSIS — B86 Scabies: Secondary | ICD-10-CM | POA: Diagnosis not present

## 2013-06-25 DIAGNOSIS — L738 Other specified follicular disorders: Secondary | ICD-10-CM | POA: Diagnosis not present

## 2013-06-25 DIAGNOSIS — R21 Rash and other nonspecific skin eruption: Secondary | ICD-10-CM | POA: Diagnosis not present

## 2013-07-13 DIAGNOSIS — Z0000 Encounter for general adult medical examination without abnormal findings: Secondary | ICD-10-CM | POA: Diagnosis not present

## 2013-07-13 DIAGNOSIS — E039 Hypothyroidism, unspecified: Secondary | ICD-10-CM | POA: Diagnosis not present

## 2013-07-13 DIAGNOSIS — E559 Vitamin D deficiency, unspecified: Secondary | ICD-10-CM | POA: Diagnosis not present

## 2013-07-13 DIAGNOSIS — E785 Hyperlipidemia, unspecified: Secondary | ICD-10-CM | POA: Diagnosis not present

## 2013-07-13 DIAGNOSIS — E538 Deficiency of other specified B group vitamins: Secondary | ICD-10-CM | POA: Diagnosis not present

## 2013-07-18 DIAGNOSIS — E538 Deficiency of other specified B group vitamins: Secondary | ICD-10-CM | POA: Diagnosis not present

## 2013-07-18 DIAGNOSIS — E785 Hyperlipidemia, unspecified: Secondary | ICD-10-CM | POA: Diagnosis not present

## 2013-07-18 DIAGNOSIS — E039 Hypothyroidism, unspecified: Secondary | ICD-10-CM | POA: Diagnosis not present

## 2013-07-18 DIAGNOSIS — E559 Vitamin D deficiency, unspecified: Secondary | ICD-10-CM | POA: Diagnosis not present

## 2013-07-19 ENCOUNTER — Other Ambulatory Visit: Payer: Self-pay

## 2013-07-24 DIAGNOSIS — L608 Other nail disorders: Secondary | ICD-10-CM | POA: Diagnosis not present

## 2013-08-15 DIAGNOSIS — M79609 Pain in unspecified limb: Secondary | ICD-10-CM | POA: Diagnosis not present

## 2013-08-15 DIAGNOSIS — M069 Rheumatoid arthritis, unspecified: Secondary | ICD-10-CM | POA: Diagnosis not present

## 2013-09-10 ENCOUNTER — Encounter: Payer: Medicare Other | Admitting: Internal Medicine

## 2013-10-17 DIAGNOSIS — M069 Rheumatoid arthritis, unspecified: Secondary | ICD-10-CM | POA: Diagnosis not present

## 2013-12-13 DIAGNOSIS — M79609 Pain in unspecified limb: Secondary | ICD-10-CM | POA: Diagnosis not present

## 2013-12-13 DIAGNOSIS — M069 Rheumatoid arthritis, unspecified: Secondary | ICD-10-CM | POA: Diagnosis not present

## 2013-12-27 ENCOUNTER — Other Ambulatory Visit: Payer: Self-pay | Admitting: Internal Medicine

## 2014-01-30 DIAGNOSIS — E538 Deficiency of other specified B group vitamins: Secondary | ICD-10-CM | POA: Diagnosis not present

## 2014-01-30 DIAGNOSIS — E785 Hyperlipidemia, unspecified: Secondary | ICD-10-CM | POA: Diagnosis not present

## 2014-01-30 DIAGNOSIS — E559 Vitamin D deficiency, unspecified: Secondary | ICD-10-CM | POA: Diagnosis not present

## 2014-02-05 ENCOUNTER — Other Ambulatory Visit: Payer: Self-pay | Admitting: Obstetrics & Gynecology

## 2014-02-05 DIAGNOSIS — N6310 Unspecified lump in the right breast, unspecified quadrant: Secondary | ICD-10-CM

## 2014-02-06 DIAGNOSIS — E039 Hypothyroidism, unspecified: Secondary | ICD-10-CM | POA: Diagnosis not present

## 2014-02-06 DIAGNOSIS — E559 Vitamin D deficiency, unspecified: Secondary | ICD-10-CM | POA: Diagnosis not present

## 2014-02-06 DIAGNOSIS — E785 Hyperlipidemia, unspecified: Secondary | ICD-10-CM | POA: Diagnosis not present

## 2014-02-06 DIAGNOSIS — N182 Chronic kidney disease, stage 2 (mild): Secondary | ICD-10-CM | POA: Diagnosis not present

## 2014-02-18 ENCOUNTER — Other Ambulatory Visit: Payer: Self-pay | Admitting: Obstetrics & Gynecology

## 2014-02-18 ENCOUNTER — Ambulatory Visit
Admission: RE | Admit: 2014-02-18 | Discharge: 2014-02-18 | Disposition: A | Discharge status: Home / Self Care | Payer: Medicare Other | Source: Ambulatory Visit | Attending: Obstetrics & Gynecology | Admitting: Obstetrics & Gynecology

## 2014-02-18 DIAGNOSIS — N6310 Unspecified lump in the right breast, unspecified quadrant: Secondary | ICD-10-CM

## 2014-02-20 DIAGNOSIS — H2510 Age-related nuclear cataract, unspecified eye: Secondary | ICD-10-CM | POA: Diagnosis not present

## 2014-02-20 DIAGNOSIS — H0019 Chalazion unspecified eye, unspecified eyelid: Secondary | ICD-10-CM | POA: Diagnosis not present

## 2014-02-20 DIAGNOSIS — H524 Presbyopia: Secondary | ICD-10-CM | POA: Diagnosis not present

## 2014-03-13 ENCOUNTER — Ambulatory Visit
Admission: RE | Admit: 2014-03-13 | Discharge: 2014-03-13 | Disposition: A | Discharge status: Home / Self Care | Payer: Medicare Other | Source: Ambulatory Visit | Attending: Obstetrics & Gynecology | Admitting: Obstetrics & Gynecology

## 2014-03-13 DIAGNOSIS — N6310 Unspecified lump in the right breast, unspecified quadrant: Secondary | ICD-10-CM

## 2014-03-13 DIAGNOSIS — N6049 Mammary duct ectasia of unspecified breast: Secondary | ICD-10-CM | POA: Diagnosis not present

## 2014-04-08 DIAGNOSIS — N951 Menopausal and female climacteric states: Secondary | ICD-10-CM | POA: Diagnosis not present

## 2014-04-08 DIAGNOSIS — N8110 Cystocele, unspecified: Secondary | ICD-10-CM | POA: Diagnosis not present

## 2014-04-15 DIAGNOSIS — M069 Rheumatoid arthritis, unspecified: Secondary | ICD-10-CM | POA: Diagnosis not present

## 2014-04-15 DIAGNOSIS — M7750 Other enthesopathy of unspecified foot: Secondary | ICD-10-CM | POA: Diagnosis not present

## 2014-04-15 DIAGNOSIS — M25539 Pain in unspecified wrist: Secondary | ICD-10-CM | POA: Diagnosis not present

## 2014-05-06 DIAGNOSIS — E039 Hypothyroidism, unspecified: Secondary | ICD-10-CM | POA: Diagnosis not present

## 2014-05-06 DIAGNOSIS — E785 Hyperlipidemia, unspecified: Secondary | ICD-10-CM | POA: Diagnosis not present

## 2014-05-13 DIAGNOSIS — E785 Hyperlipidemia, unspecified: Secondary | ICD-10-CM | POA: Diagnosis not present

## 2014-05-13 DIAGNOSIS — E559 Vitamin D deficiency, unspecified: Secondary | ICD-10-CM | POA: Diagnosis not present

## 2014-05-13 DIAGNOSIS — E039 Hypothyroidism, unspecified: Secondary | ICD-10-CM | POA: Diagnosis not present

## 2014-05-13 DIAGNOSIS — N182 Chronic kidney disease, stage 2 (mild): Secondary | ICD-10-CM | POA: Diagnosis not present

## 2014-06-28 ENCOUNTER — Other Ambulatory Visit: Payer: Self-pay

## 2014-07-08 DIAGNOSIS — Z23 Encounter for immunization: Secondary | ICD-10-CM | POA: Diagnosis not present

## 2014-07-09 ENCOUNTER — Ambulatory Visit (HOSPITAL_COMMUNITY): Payer: Medicare Other | Attending: Internal Medicine | Admitting: *Deleted

## 2014-07-09 ENCOUNTER — Other Ambulatory Visit (HOSPITAL_COMMUNITY): Payer: Self-pay | Admitting: *Deleted

## 2014-07-09 DIAGNOSIS — Z139 Encounter for screening, unspecified: Secondary | ICD-10-CM

## 2014-07-09 NOTE — Progress Notes (Signed)
Screening(Aorta,Carotid,ABI) performed

## 2014-08-13 DIAGNOSIS — M859 Disorder of bone density and structure, unspecified: Secondary | ICD-10-CM | POA: Diagnosis not present

## 2014-08-13 DIAGNOSIS — Z0000 Encounter for general adult medical examination without abnormal findings: Secondary | ICD-10-CM | POA: Diagnosis not present

## 2014-08-13 DIAGNOSIS — E785 Hyperlipidemia, unspecified: Secondary | ICD-10-CM | POA: Diagnosis not present

## 2014-08-13 DIAGNOSIS — Z1389 Encounter for screening for other disorder: Secondary | ICD-10-CM | POA: Diagnosis not present

## 2014-08-13 DIAGNOSIS — Z23 Encounter for immunization: Secondary | ICD-10-CM | POA: Diagnosis not present

## 2014-08-13 DIAGNOSIS — E538 Deficiency of other specified B group vitamins: Secondary | ICD-10-CM | POA: Diagnosis not present

## 2014-08-13 DIAGNOSIS — E559 Vitamin D deficiency, unspecified: Secondary | ICD-10-CM | POA: Diagnosis not present

## 2014-08-15 DIAGNOSIS — M79643 Pain in unspecified hand: Secondary | ICD-10-CM | POA: Diagnosis not present

## 2014-08-15 DIAGNOSIS — M0589 Other rheumatoid arthritis with rheumatoid factor of multiple sites: Secondary | ICD-10-CM | POA: Diagnosis not present

## 2014-08-19 DIAGNOSIS — E559 Vitamin D deficiency, unspecified: Secondary | ICD-10-CM | POA: Diagnosis not present

## 2014-08-19 DIAGNOSIS — N182 Chronic kidney disease, stage 2 (mild): Secondary | ICD-10-CM | POA: Diagnosis not present

## 2014-08-19 DIAGNOSIS — E039 Hypothyroidism, unspecified: Secondary | ICD-10-CM | POA: Diagnosis not present

## 2014-08-19 DIAGNOSIS — E785 Hyperlipidemia, unspecified: Secondary | ICD-10-CM | POA: Diagnosis not present

## 2014-10-16 DIAGNOSIS — M0589 Other rheumatoid arthritis with rheumatoid factor of multiple sites: Secondary | ICD-10-CM | POA: Diagnosis not present

## 2014-11-15 DIAGNOSIS — D2262 Melanocytic nevi of left upper limb, including shoulder: Secondary | ICD-10-CM | POA: Diagnosis not present

## 2014-11-15 DIAGNOSIS — L570 Actinic keratosis: Secondary | ICD-10-CM | POA: Diagnosis not present

## 2014-11-15 DIAGNOSIS — D2271 Melanocytic nevi of right lower limb, including hip: Secondary | ICD-10-CM | POA: Diagnosis not present

## 2014-11-15 DIAGNOSIS — D2272 Melanocytic nevi of left lower limb, including hip: Secondary | ICD-10-CM | POA: Diagnosis not present

## 2014-11-15 DIAGNOSIS — D2261 Melanocytic nevi of right upper limb, including shoulder: Secondary | ICD-10-CM | POA: Diagnosis not present

## 2014-11-15 DIAGNOSIS — D2239 Melanocytic nevi of other parts of face: Secondary | ICD-10-CM | POA: Diagnosis not present

## 2014-11-15 DIAGNOSIS — D224 Melanocytic nevi of scalp and neck: Secondary | ICD-10-CM | POA: Diagnosis not present

## 2014-11-15 DIAGNOSIS — L821 Other seborrheic keratosis: Secondary | ICD-10-CM | POA: Diagnosis not present

## 2014-11-15 DIAGNOSIS — D225 Melanocytic nevi of trunk: Secondary | ICD-10-CM | POA: Diagnosis not present

## 2014-11-15 DIAGNOSIS — L820 Inflamed seborrheic keratosis: Secondary | ICD-10-CM | POA: Diagnosis not present

## 2014-11-15 DIAGNOSIS — D1801 Hemangioma of skin and subcutaneous tissue: Secondary | ICD-10-CM | POA: Diagnosis not present

## 2014-12-16 DIAGNOSIS — M79643 Pain in unspecified hand: Secondary | ICD-10-CM | POA: Diagnosis not present

## 2014-12-16 DIAGNOSIS — M0589 Other rheumatoid arthritis with rheumatoid factor of multiple sites: Secondary | ICD-10-CM | POA: Diagnosis not present

## 2014-12-16 DIAGNOSIS — Z79899 Other long term (current) drug therapy: Secondary | ICD-10-CM | POA: Diagnosis not present

## 2014-12-25 DIAGNOSIS — N8110 Cystocele, unspecified: Secondary | ICD-10-CM | POA: Diagnosis not present

## 2015-01-09 ENCOUNTER — Encounter: Payer: Self-pay | Admitting: Gastroenterology

## 2015-01-15 DIAGNOSIS — N393 Stress incontinence (female) (male): Secondary | ICD-10-CM | POA: Insufficient documentation

## 2015-01-15 DIAGNOSIS — N819 Female genital prolapse, unspecified: Secondary | ICD-10-CM | POA: Insufficient documentation

## 2015-01-15 DIAGNOSIS — N993 Prolapse of vaginal vault after hysterectomy: Secondary | ICD-10-CM | POA: Diagnosis not present

## 2015-01-24 ENCOUNTER — Encounter: Payer: Self-pay | Admitting: Podiatry

## 2015-01-24 ENCOUNTER — Ambulatory Visit (INDEPENDENT_AMBULATORY_CARE_PROVIDER_SITE_OTHER): Payer: Medicare Other | Admitting: Podiatry

## 2015-01-24 VITALS — BP 149/71 | HR 51 | Ht 67.0 in | Wt 172.0 lb

## 2015-01-24 DIAGNOSIS — Q828 Other specified congenital malformations of skin: Secondary | ICD-10-CM | POA: Diagnosis not present

## 2015-01-24 DIAGNOSIS — M2042 Other hammer toe(s) (acquired), left foot: Secondary | ICD-10-CM | POA: Diagnosis not present

## 2015-01-24 DIAGNOSIS — M79605 Pain in left leg: Secondary | ICD-10-CM | POA: Diagnosis not present

## 2015-01-24 NOTE — Patient Instructions (Signed)
Painful callus left ball. Debrided. Will try Acid patch for a week and have the lesion removed at the office. Return when ready for the procedure or for regular trimming.

## 2015-01-24 NOTE — Addendum Note (Signed)
Addended by: Camelia Phenes on: 01/24/2015 05:41 PM   Modules accepted: Orders, Medications

## 2015-01-24 NOTE — Progress Notes (Signed)
Subjective: 74 year old female presents accompanied by her husband complaining of painful callus under the ball of left foot since last December.  Objective: Multiple contracted lesser digits bilateral. Porokeratotic lesion under 2nd MPJ left, broad callus under 2nd MPJ right. Neurovascular status are within normal.  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  Assessment: Painful porokeratosis sub 2 left.  Plan: Reviewed findings and available treatment options. Lesions debrided. May try office procedure after treating with Acid patch.  Patient will return after she done with her bladder surgery.

## 2015-01-30 DIAGNOSIS — R001 Bradycardia, unspecified: Secondary | ICD-10-CM | POA: Diagnosis not present

## 2015-01-30 DIAGNOSIS — M069 Rheumatoid arthritis, unspecified: Secondary | ICD-10-CM | POA: Insufficient documentation

## 2015-01-30 DIAGNOSIS — Z01810 Encounter for preprocedural cardiovascular examination: Secondary | ICD-10-CM | POA: Diagnosis not present

## 2015-02-03 DIAGNOSIS — N362 Urethral caruncle: Secondary | ICD-10-CM | POA: Diagnosis not present

## 2015-02-03 DIAGNOSIS — Z79899 Other long term (current) drug therapy: Secondary | ICD-10-CM | POA: Diagnosis not present

## 2015-02-03 DIAGNOSIS — Z90710 Acquired absence of both cervix and uterus: Secondary | ICD-10-CM | POA: Diagnosis not present

## 2015-02-03 DIAGNOSIS — M069 Rheumatoid arthritis, unspecified: Secondary | ICD-10-CM | POA: Diagnosis not present

## 2015-02-03 DIAGNOSIS — N993 Prolapse of vaginal vault after hysterectomy: Secondary | ICD-10-CM | POA: Diagnosis not present

## 2015-02-03 DIAGNOSIS — N816 Rectocele: Secondary | ICD-10-CM | POA: Diagnosis not present

## 2015-02-03 DIAGNOSIS — E079 Disorder of thyroid, unspecified: Secondary | ICD-10-CM | POA: Diagnosis not present

## 2015-02-03 DIAGNOSIS — Z7982 Long term (current) use of aspirin: Secondary | ICD-10-CM | POA: Diagnosis not present

## 2015-02-03 DIAGNOSIS — N393 Stress incontinence (female) (male): Secondary | ICD-10-CM | POA: Diagnosis not present

## 2015-02-04 DIAGNOSIS — N993 Prolapse of vaginal vault after hysterectomy: Secondary | ICD-10-CM | POA: Diagnosis not present

## 2015-02-04 DIAGNOSIS — E079 Disorder of thyroid, unspecified: Secondary | ICD-10-CM | POA: Diagnosis not present

## 2015-02-04 DIAGNOSIS — M069 Rheumatoid arthritis, unspecified: Secondary | ICD-10-CM | POA: Diagnosis not present

## 2015-02-04 DIAGNOSIS — N362 Urethral caruncle: Secondary | ICD-10-CM | POA: Diagnosis not present

## 2015-02-04 DIAGNOSIS — N393 Stress incontinence (female) (male): Secondary | ICD-10-CM | POA: Diagnosis not present

## 2015-02-04 DIAGNOSIS — Z90710 Acquired absence of both cervix and uterus: Secondary | ICD-10-CM | POA: Diagnosis not present

## 2015-02-06 DIAGNOSIS — N393 Stress incontinence (female) (male): Secondary | ICD-10-CM | POA: Diagnosis not present

## 2015-02-14 ENCOUNTER — Other Ambulatory Visit: Payer: Self-pay

## 2015-02-14 DIAGNOSIS — Z1231 Encounter for screening mammogram for malignant neoplasm of breast: Secondary | ICD-10-CM

## 2015-02-18 DIAGNOSIS — E559 Vitamin D deficiency, unspecified: Secondary | ICD-10-CM | POA: Diagnosis not present

## 2015-02-18 DIAGNOSIS — D81818 Other biotin-dependent carboxylase deficiency: Secondary | ICD-10-CM | POA: Diagnosis not present

## 2015-02-18 DIAGNOSIS — M8580 Other specified disorders of bone density and structure, unspecified site: Secondary | ICD-10-CM | POA: Diagnosis not present

## 2015-02-18 DIAGNOSIS — D473 Essential (hemorrhagic) thrombocythemia: Secondary | ICD-10-CM | POA: Diagnosis not present

## 2015-02-18 DIAGNOSIS — I5032 Chronic diastolic (congestive) heart failure: Secondary | ICD-10-CM | POA: Diagnosis not present

## 2015-02-18 DIAGNOSIS — E785 Hyperlipidemia, unspecified: Secondary | ICD-10-CM | POA: Diagnosis not present

## 2015-02-18 DIAGNOSIS — N182 Chronic kidney disease, stage 2 (mild): Secondary | ICD-10-CM | POA: Diagnosis not present

## 2015-03-10 ENCOUNTER — Other Ambulatory Visit: Payer: Self-pay

## 2015-03-19 DIAGNOSIS — M0589 Other rheumatoid arthritis with rheumatoid factor of multiple sites: Secondary | ICD-10-CM | POA: Diagnosis not present

## 2015-03-19 DIAGNOSIS — Z09 Encounter for follow-up examination after completed treatment for conditions other than malignant neoplasm: Secondary | ICD-10-CM | POA: Diagnosis not present

## 2015-03-19 DIAGNOSIS — Z79899 Other long term (current) drug therapy: Secondary | ICD-10-CM | POA: Diagnosis not present

## 2015-03-25 ENCOUNTER — Ambulatory Visit
Admission: RE | Admit: 2015-03-25 | Discharge: 2015-03-25 | Disposition: A | Discharge status: Home / Self Care | Payer: Medicare Other | Source: Ambulatory Visit

## 2015-03-25 DIAGNOSIS — Z1231 Encounter for screening mammogram for malignant neoplasm of breast: Secondary | ICD-10-CM

## 2015-04-21 DIAGNOSIS — H2513 Age-related nuclear cataract, bilateral: Secondary | ICD-10-CM | POA: Diagnosis not present

## 2015-04-21 DIAGNOSIS — H524 Presbyopia: Secondary | ICD-10-CM | POA: Diagnosis not present

## 2015-04-21 DIAGNOSIS — H5203 Hypermetropia, bilateral: Secondary | ICD-10-CM | POA: Diagnosis not present

## 2015-06-03 ENCOUNTER — Encounter (HOSPITAL_BASED_OUTPATIENT_CLINIC_OR_DEPARTMENT_OTHER): Payer: Self-pay

## 2015-06-03 ENCOUNTER — Emergency Department (HOSPITAL_BASED_OUTPATIENT_CLINIC_OR_DEPARTMENT_OTHER)
Admission: EM | Admit: 2015-06-03 | Discharge: 2015-06-03 | Disposition: A | Discharge status: Home / Self Care | Payer: Medicare Other | Attending: Emergency Medicine | Admitting: Emergency Medicine

## 2015-06-03 DIAGNOSIS — Z8719 Personal history of other diseases of the digestive system: Secondary | ICD-10-CM | POA: Insufficient documentation

## 2015-06-03 DIAGNOSIS — Z7982 Long term (current) use of aspirin: Secondary | ICD-10-CM | POA: Insufficient documentation

## 2015-06-03 DIAGNOSIS — M069 Rheumatoid arthritis, unspecified: Secondary | ICD-10-CM | POA: Diagnosis not present

## 2015-06-03 DIAGNOSIS — E079 Disorder of thyroid, unspecified: Secondary | ICD-10-CM | POA: Diagnosis not present

## 2015-06-03 DIAGNOSIS — E785 Hyperlipidemia, unspecified: Secondary | ICD-10-CM | POA: Diagnosis not present

## 2015-06-03 DIAGNOSIS — Z79899 Other long term (current) drug therapy: Secondary | ICD-10-CM | POA: Insufficient documentation

## 2015-06-03 DIAGNOSIS — I10 Essential (primary) hypertension: Secondary | ICD-10-CM | POA: Diagnosis not present

## 2015-06-03 DIAGNOSIS — Z8742 Personal history of other diseases of the female genital tract: Secondary | ICD-10-CM | POA: Insufficient documentation

## 2015-06-03 DIAGNOSIS — Z87891 Personal history of nicotine dependence: Secondary | ICD-10-CM | POA: Diagnosis not present

## 2015-06-03 DIAGNOSIS — Z86010 Personal history of colonic polyps: Secondary | ICD-10-CM | POA: Diagnosis not present

## 2015-06-03 DIAGNOSIS — M79601 Pain in right arm: Secondary | ICD-10-CM | POA: Diagnosis present

## 2015-06-03 DIAGNOSIS — Z973 Presence of spectacles and contact lenses: Secondary | ICD-10-CM | POA: Diagnosis not present

## 2015-06-03 DIAGNOSIS — D649 Anemia, unspecified: Secondary | ICD-10-CM | POA: Insufficient documentation

## 2015-06-03 DIAGNOSIS — I00 Rheumatic fever without heart involvement: Secondary | ICD-10-CM

## 2015-06-03 MED ORDER — METHYLPREDNISOLONE SODIUM SUCC 125 MG IJ SOLR
125.0000 mg | Freq: Once | INTRAMUSCULAR | Status: AC
  Administered 2015-06-03: 125 mg via INTRAMUSCULAR
  Filled 2015-06-03: qty 2

## 2015-06-03 MED ORDER — ONDANSETRON 4 MG PO TBDP: 4.0000 mg | ORAL_TABLET | Freq: Three times a day (TID) | ORAL | Status: DC | PRN

## 2015-06-03 MED ORDER — ONDANSETRON 4 MG PO TBDP
4.0000 mg | ORAL_TABLET | Freq: Once | ORAL | Status: AC
  Administered 2015-06-03: 4 mg via ORAL
  Filled 2015-06-03: qty 1

## 2015-06-03 MED ORDER — METHYLPREDNISOLONE 4 MG PO TBPK: ORAL_TABLET | ORAL | Status: DC

## 2015-06-03 MED ORDER — MORPHINE SULFATE (PF) 4 MG/ML IV SOLN
4.0000 mg | Freq: Once | INTRAVENOUS | Status: AC
  Administered 2015-06-03: 4 mg via INTRAMUSCULAR
  Filled 2015-06-03: qty 1

## 2015-06-03 MED ORDER — OXYCODONE-ACETAMINOPHEN 5-325 MG PO TABS: 1.0000 | ORAL_TABLET | ORAL | Status: DC | PRN

## 2015-06-03 NOTE — Discharge Instructions (Signed)
Rheumatoid Arthritis °Rheumatoid arthritis is a long-term (chronic) inflammatory disease that causes pain, swelling, and stiffness of the joints. It can affect the entire body, including the eyes and lungs. The effects of rheumatoid arthritis vary widely among those with the condition. °CAUSES  °The cause of rheumatoid arthritis is not known. It tends to run in families and is more common in women. Certain cells of the body's natural defense system (immune system) do not work properly and begin to attack healthy joints. It primarily involves the connective tissue that lines the joints (synovial membrane). This can cause damage to the joint. °SYMPTOMS  °· Pain, stiffness, swelling, and decreased motion of many joints, especially in the hands and feet. °· Stiffness that is worse in the morning. It may last 1-2 hours or longer. °· Numbness and tingling in the hands. °· Fatigue. °· Loss of appetite. °· Weight loss. °· Low-grade fever. °· Dry eyes and mouth. °· Firm lumps (rheumatoid nodules) that grow beneath the skin in areas such as the elbows and hands. °DIAGNOSIS  °Diagnosis is based on the symptoms described, an exam, and blood tests. Sometimes, X-rays are helpful. °TREATMENT  °The goals of treatment are to relieve pain, reduce inflammation, and to slow down or stop joint damage and disability. Methods vary and may include: °· Maintaining a balance of rest, exercise, and proper nutrition. °· Medicines: °¨ Pain relievers (analgesics). °¨ Corticosteroids and nonsteroidal anti-inflammatory drugs (NSAIDs) to reduce inflammation. °¨ Disease-modifying antirheumatic drugs (DMARDs) to try to slow the course of the disease. °¨ Biologic response modifiers to reduce inflammation and damage. °· Physical therapy and occupational therapy. °· Surgery for patients with severe joint damage. Joint replacement or fusing of joints may be needed. °· Routine monitoring and ongoing care, such as office visits, blood and urine tests, and  X-rays. °HOME CARE INSTRUCTIONS  °· Remain physically active and reduce activity when the disease gets worse. °· Eat a well-balanced diet. °· Put heat on affected joints when you wake up and before activities. Keep the heat on the affected joint for as long as directed by your health care provider. °· Put ice on affected joints following activities or exercising. °¨ Put ice in a plastic bag. °¨ Place a towel between your skin and the bag. °¨ Leave the ice on for 15-20 minutes, 3-4 times per day, or as directed by your health care provider. °· Take medicines and supplements only as directed by your health care provider. °· Use splints as directed by your health care provider. Splints help maintain joint position and function. °· Do not sleep with pillows under your knees. This may lead to spasms. °· Participate in a self-management program to keep current with the latest treatment and coping skills. °SEEK IMMEDIATE MEDICAL CARE IF: °· You have fainting episodes. °· You have periods of extreme weakness. °· You rapidly develop a hot, painful joint that is more severe than usual joint aches. °· You have chills. °· You have a fever. °FOR MORE INFORMATION  °· American College of Rheumatology: www.rheumatology.org °· Arthritis Foundation: www.arthritis.org °Document Released: 08/27/2000 Document Revised: 01/14/2014 Document Reviewed: 10/06/2011 °ExitCare® Patient Information ©2015 [employer]. This information is not intended to replace advice given to you by your health care provider. Make sure you discuss any questions you have with your health care provider. ° °

## 2015-06-03 NOTE — ED Provider Notes (Signed)
TIME SEEN: 3:55 AM  CHIEF COMPLAINT: Right arm pain  HPI: Pt is a 74 y.o. female with history of rheumatoid arthritis on methotrexate who presents emergency department with complaints of right hand pain and swelling. States this feels like a rheumatoid arthritis flare. States she thinks that she "overdid it" this weekend. No history of injury to the arm. No history of previous DVT. No history of recent fracture,, surgery, immobilization, IV placement in the right arm. States she's been taking Celebrex, full tearing and oxycodone with some relief. No fever.  ROS: See HPI Constitutional: no fever  Eyes: no drainage  ENT: no runny nose   Cardiovascular:  no chest pain  Resp: no SOB  GI: no vomiting GU: no dysuria Integumentary: no rash  Allergy: no hives  Musculoskeletal: no leg swelling  Neurological: no slurred speech ROS otherwise negative  PAST MEDICAL HISTORY/PAST SURGICAL HISTORY:  Past Medical History  Diagnosis Date  . Hyperlipidemia   . Thyroid lump   . Night sweats   . Fatigue   . Bruises easily   . Colon polyp   . Reflux   . Hemorrhoids   . Anemia   . Wears glasses   . Sinus problem   . Rheumatoid arthritis 2007    muscle weaknes at times (Dr. Amil Amen)  . Menopause   . Thyroid disease   . Hypertension     MEDICATIONS:  Prior to Admission medications   Medication Sig Start Date End Date Taking? Authorizing Adarrius Graeff  aspirin 81 MG tablet Take 81 mg by mouth daily.     Yes Historical Dandra Velardi, MD  Calcium Citrate-Vitamin D (CALCIUM CITRATE + D) 250-200 MG-UNIT TABS Take 2 tablets by mouth.    Yes Historical Rashae Rother, MD  celecoxib (CELEBREX) 200 MG capsule Take 200 mg by mouth daily as needed.   Yes Historical Sabino Denning, MD  Cyanocobalamin (B-12) 1000 MCG LOZG Take 1 lozenge by mouth daily.   Yes Historical Daralyn Bert, MD  Diclofenac Sodium (VOLTAREN PO) Take by mouth.   Yes Historical Kynnadi Dicenso, MD  diphenhydrAMINE (BENADRYL) 25 MG tablet Take 25 mg by mouth at  bedtime as needed.   Yes Historical Anaalicia Reimann, MD  Estradiol (VAGIFEM) 10 MCG TABS Place vaginally. One tab per week    Yes Historical Brentt Fread, MD  fish oil-omega-3 fatty acids 1000 MG capsule Take 2 g by mouth daily.     Yes Historical Bellarae Lizer, MD  folic acid (FOLVITE) 1 MG tablet Take 2 mg by mouth daily.    Yes Historical Laycee Fitzsimmons, MD  methotrexate (RHEUMATREX) 2.5 MG tablet Take 5 tablets (12.5 mg total) by mouth once a week. 06/14/11  Yes Ricard Dillon, MD  Multiple Vitamin (MULTIVITAMIN PO) Take by mouth daily.     Yes Historical Nikol Lemar, MD  Oxycodone-Acetaminophen (PERCOCET PO) Take by mouth.   Yes Historical Elaijah Munoz, MD  pravastatin (PRAVACHOL) 10 MG tablet Take 10 mg by mouth daily. 12/03/14  Yes Historical Latash Nouri, MD  SYNTHROID 112 MCG tablet TAKE 1 TABLET (112 MCG TOTAL) BY MOUTH DAILY.   Yes Ricard Dillon, MD  methylPREDNISolone (MEDROL DOSEPAK) 4 MG TBPK tablet Take as directed 06/03/15   Kristen N Ward, DO  ondansetron (ZOFRAN ODT) 4 MG disintegrating tablet Take 1 tablet (4 mg total) by mouth every 8 (eight) hours as needed for nausea or vomiting. 06/03/15   Delice Bison Ward, DO  oxyCODONE-acetaminophen (PERCOCET/ROXICET) 5-325 MG per tablet Take 1 tablet by mouth every 4 (four) hours as needed. 06/03/15   Cyril Mourning  N Ward, DO    ALLERGIES:  Allergies  Allergen Reactions  . Neosporin [Neomycin-Bacitracin Zn-Polymyx] Other (See Comments)    Patient states that wounds or scratches never heal.    SOCIAL HISTORY:  Social History  Substance Use Topics  . Smoking status: Former Smoker -- 0.50 packs/day for 10 years    Types: Cigarettes    Quit date: 11/19/1973  . Smokeless tobacco: Never Used  . Alcohol Use: 0.0 oz/week    0 Standard drinks or equivalent per week     Comment: very rare glass of wine    FAMILY HISTORY: Family History  Problem Relation Age of Onset  . Colon cancer Neg Hx   . Esophageal cancer Neg Hx   . Stomach cancer Neg Hx   . Rectal cancer Neg Hx   .  Hypertension Mother     EXAM: BP 138/93 mmHg  Pulse 55  Temp(Src) 98.1 F (36.7 C) (Oral)  Resp 14  Ht 5\' 7"  (1.702 m)  Wt 172 lb (78.019 kg)  BMI 26.93 kg/m2  SpO2 97% CONSTITUTIONAL: Alert and oriented and responds appropriately to questions. Well-appearing; well-nourished HEAD: Normocephalic EYES: Conjunctivae clear, PERRL ENT: normal nose; no rhinorrhea; moist mucous membranes; pharynx without lesions noted NECK: Supple, no meningismus, no LAD  CARD: RRR; S1 and S2 appreciated; no murmurs, no clicks, no rubs, no gallops RESP: Normal chest excursion without splinting or tachypnea; breath sounds clear and equal bilaterally; no wheezes, no rhonchi, no rales, no hypoxia or respiratory distress, speaking full sentences ABD/GI: Normal bowel sounds; non-distended; soft, non-tender, no rebound, no guarding, no peritoneal signs BACK:  The back appears normal and is non-tender to palpation, there is no CVA tenderness EXT: Right fingers are slightly swollen compared to left with diffuse tenderness throughout the right hand and right wrist without bony deformity, no erythema or warmth, 2+ radial pulses bilaterally, no joint effusion, compartment soft, no fluctuance or induration, decreased flexion and extension of her fingers and wrist secondary to pain but she does have full range of motion in his joints, otherwise Normal ROM in all joints; otherwise extremities are non-tender to palpation; no edema; normal capillary refill; no cyanosis, no calf tenderness or swelling    SKIN: Normal color for age and race; warm NEURO: Moves all extremities equally, sensation to light touch intact diffusely, cranial nerves II through XII intact PSYCH: The patient's mood and manner are appropriate. Grooming and personal hygiene are appropriate.  MEDICAL DECISION MAKING: Patient here with a flare of her rheumatoid arthritis. Will give dose of IM soluMedrol in the emergency department as well as IM morphine for pain  control. No signs of cellulitis on exam. No history of injury to suggest fracture or dislocation. She is neurovascularly intact distally. No sign of septic arthritis or compartment syndrome. Doubt DVT. We'll discharge with steroid taper and refill her pain medication. Have advised her to follow-up with her primary care physician if symptoms do not improve in the next several days. Discussed rest, elevation and ice. Discussed return precautions. She verbalized understanding and is comfortable with this plan.       Ruby, DO 06/03/15 [phone]

## 2015-06-03 NOTE — ED Notes (Signed)
Pt reports right arm pain, swelling, warmth from area to fingertips up to right elbow - pain is worse with movement - pt states she thinks this is a flare of her RA - but she rarely has flares of RA - states she has taken Celebrex, Oxycodone, Voltaren - all of which were ineffective. Pain onset: yesterday morning. Reports her weekend was "busier than usual."

## 2015-06-04 DIAGNOSIS — Z79899 Other long term (current) drug therapy: Secondary | ICD-10-CM | POA: Diagnosis not present

## 2015-06-04 DIAGNOSIS — M0589 Other rheumatoid arthritis with rheumatoid factor of multiple sites: Secondary | ICD-10-CM | POA: Diagnosis not present

## 2015-06-04 DIAGNOSIS — M25431 Effusion, right wrist: Secondary | ICD-10-CM | POA: Diagnosis not present

## 2015-06-23 DIAGNOSIS — J019 Acute sinusitis, unspecified: Secondary | ICD-10-CM | POA: Diagnosis not present

## 2015-07-10 DIAGNOSIS — Z23 Encounter for immunization: Secondary | ICD-10-CM | POA: Diagnosis not present

## 2015-08-04 DIAGNOSIS — M0589 Other rheumatoid arthritis with rheumatoid factor of multiple sites: Secondary | ICD-10-CM | POA: Diagnosis not present

## 2015-08-04 DIAGNOSIS — M7989 Other specified soft tissue disorders: Secondary | ICD-10-CM | POA: Diagnosis not present

## 2015-08-04 DIAGNOSIS — Z79899 Other long term (current) drug therapy: Secondary | ICD-10-CM | POA: Diagnosis not present

## 2015-08-20 DIAGNOSIS — E785 Hyperlipidemia, unspecified: Secondary | ICD-10-CM | POA: Diagnosis not present

## 2015-08-20 DIAGNOSIS — Z23 Encounter for immunization: Secondary | ICD-10-CM | POA: Diagnosis not present

## 2015-08-20 DIAGNOSIS — D81818 Other biotin-dependent carboxylase deficiency: Secondary | ICD-10-CM | POA: Diagnosis not present

## 2015-08-20 DIAGNOSIS — Z0000 Encounter for general adult medical examination without abnormal findings: Secondary | ICD-10-CM | POA: Diagnosis not present

## 2015-08-20 DIAGNOSIS — E559 Vitamin D deficiency, unspecified: Secondary | ICD-10-CM | POA: Diagnosis not present

## 2015-08-20 DIAGNOSIS — Z1389 Encounter for screening for other disorder: Secondary | ICD-10-CM | POA: Diagnosis not present

## 2015-08-27 DIAGNOSIS — M8580 Other specified disorders of bone density and structure, unspecified site: Secondary | ICD-10-CM | POA: Diagnosis not present

## 2015-08-27 DIAGNOSIS — F17211 Nicotine dependence, cigarettes, in remission: Secondary | ICD-10-CM | POA: Diagnosis not present

## 2015-08-27 DIAGNOSIS — E785 Hyperlipidemia, unspecified: Secondary | ICD-10-CM | POA: Diagnosis not present

## 2015-08-27 DIAGNOSIS — E538 Deficiency of other specified B group vitamins: Secondary | ICD-10-CM | POA: Diagnosis not present

## 2015-08-27 DIAGNOSIS — E039 Hypothyroidism, unspecified: Secondary | ICD-10-CM | POA: Diagnosis not present

## 2015-08-27 DIAGNOSIS — N182 Chronic kidney disease, stage 2 (mild): Secondary | ICD-10-CM | POA: Diagnosis not present

## 2015-09-17 DIAGNOSIS — Z09 Encounter for follow-up examination after completed treatment for conditions other than malignant neoplasm: Secondary | ICD-10-CM | POA: Diagnosis not present

## 2015-09-17 DIAGNOSIS — N8112 Cystocele, lateral: Secondary | ICD-10-CM | POA: Diagnosis not present

## 2015-10-15 ENCOUNTER — Encounter: Payer: Self-pay | Admitting: Gastroenterology

## 2015-11-14 DIAGNOSIS — D225 Melanocytic nevi of trunk: Secondary | ICD-10-CM | POA: Diagnosis not present

## 2015-11-14 DIAGNOSIS — D2261 Melanocytic nevi of right upper limb, including shoulder: Secondary | ICD-10-CM | POA: Diagnosis not present

## 2015-11-14 DIAGNOSIS — L821 Other seborrheic keratosis: Secondary | ICD-10-CM | POA: Diagnosis not present

## 2015-11-14 DIAGNOSIS — D1801 Hemangioma of skin and subcutaneous tissue: Secondary | ICD-10-CM | POA: Diagnosis not present

## 2015-11-14 DIAGNOSIS — D2272 Melanocytic nevi of left lower limb, including hip: Secondary | ICD-10-CM | POA: Diagnosis not present

## 2015-11-14 DIAGNOSIS — L814 Other melanin hyperpigmentation: Secondary | ICD-10-CM | POA: Diagnosis not present

## 2015-11-14 DIAGNOSIS — D2271 Melanocytic nevi of right lower limb, including hip: Secondary | ICD-10-CM | POA: Diagnosis not present

## 2015-12-02 DIAGNOSIS — M0589 Other rheumatoid arthritis with rheumatoid factor of multiple sites: Secondary | ICD-10-CM | POA: Diagnosis not present

## 2015-12-02 DIAGNOSIS — Z79899 Other long term (current) drug therapy: Secondary | ICD-10-CM | POA: Diagnosis not present

## 2015-12-22 DIAGNOSIS — M549 Dorsalgia, unspecified: Secondary | ICD-10-CM | POA: Diagnosis not present

## 2015-12-22 DIAGNOSIS — R05 Cough: Secondary | ICD-10-CM | POA: Diagnosis not present

## 2015-12-22 DIAGNOSIS — R509 Fever, unspecified: Secondary | ICD-10-CM | POA: Diagnosis not present

## 2015-12-22 DIAGNOSIS — R52 Pain, unspecified: Secondary | ICD-10-CM | POA: Diagnosis not present

## 2015-12-24 DIAGNOSIS — R6889 Other general symptoms and signs: Secondary | ICD-10-CM | POA: Diagnosis not present

## 2015-12-24 DIAGNOSIS — R52 Pain, unspecified: Secondary | ICD-10-CM | POA: Diagnosis not present

## 2015-12-24 DIAGNOSIS — R509 Fever, unspecified: Secondary | ICD-10-CM | POA: Diagnosis not present

## 2015-12-29 DIAGNOSIS — R509 Fever, unspecified: Secondary | ICD-10-CM | POA: Diagnosis not present

## 2015-12-29 DIAGNOSIS — R6889 Other general symptoms and signs: Secondary | ICD-10-CM | POA: Diagnosis not present

## 2016-01-28 IMAGING — MG MM DIAG BREAST TOMO BILATERAL
8 of 12 series · 8 of 28 positions shown · non-contrast
Comparison: Previous examinations, the most recent dated 04/10/2013
and 03/26/2013.

CLINICAL DATA: Followup right breast probable complex cyst. The
patient did not return for a recommended 6 month followup.

EXAM:
DIGITAL DIAGNOSTIC BILATERAL MAMMOGRAM WITH 3D TOMOSYNTHESIS WITH
CAD
ULTRASOUND RIGHT BREAST

[R CC synth-2D]
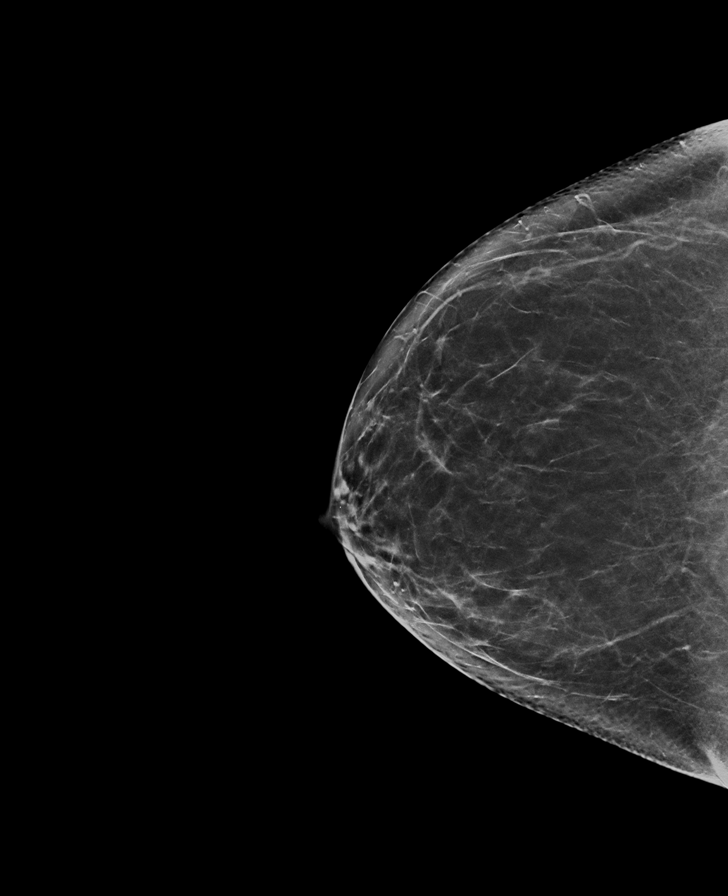

[R CC]
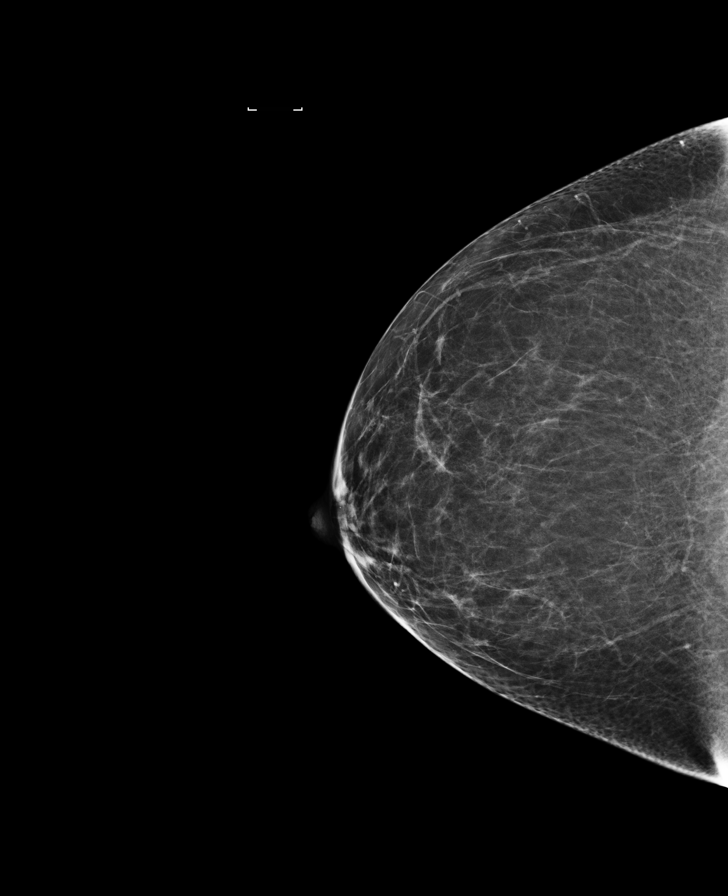

[L CC synth-2D]
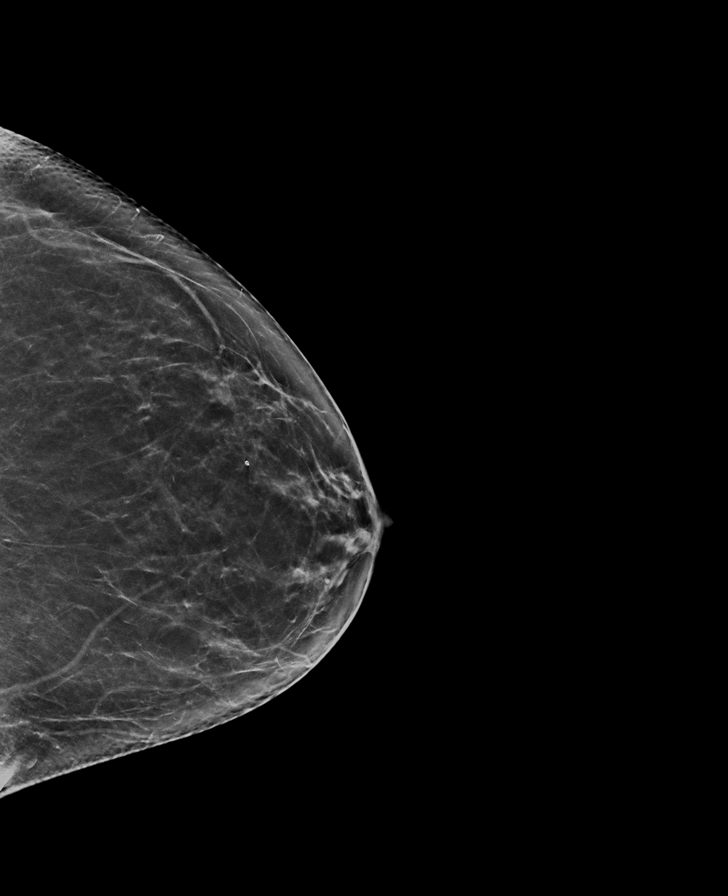

[L CC]
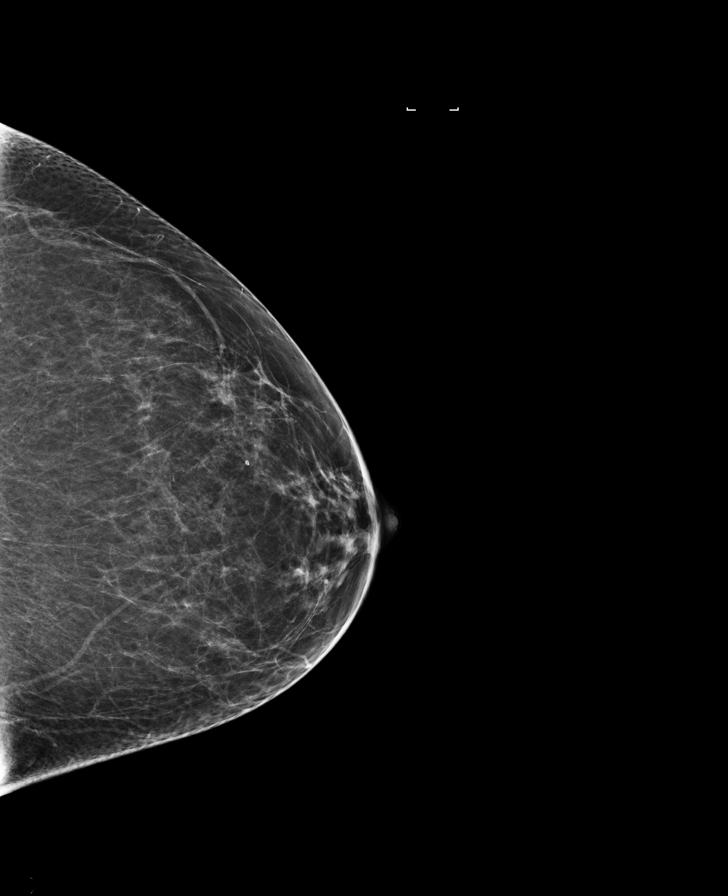

[R MLO]
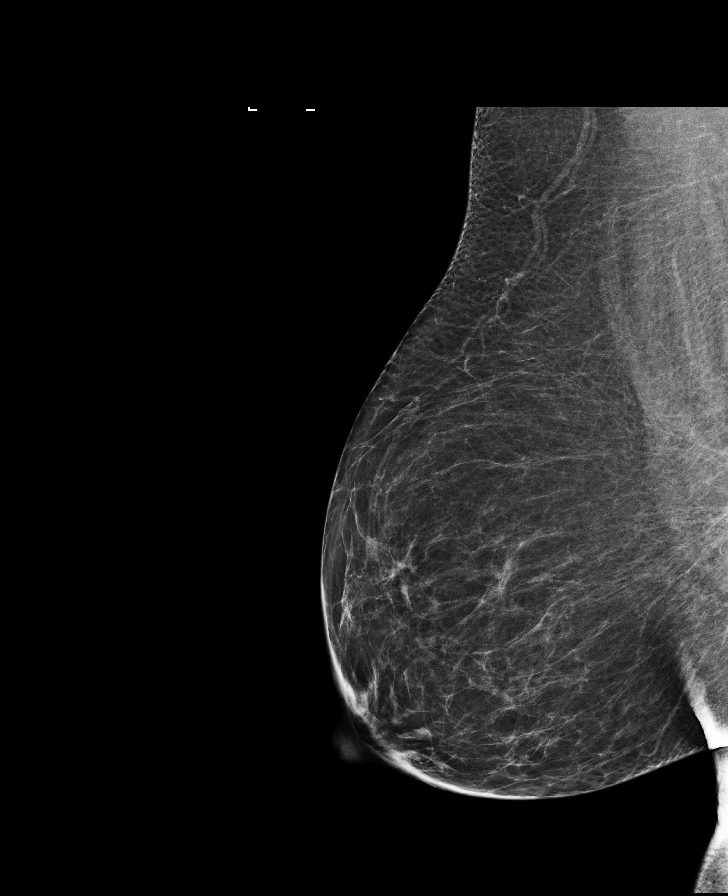

[R MLO synth-2D]
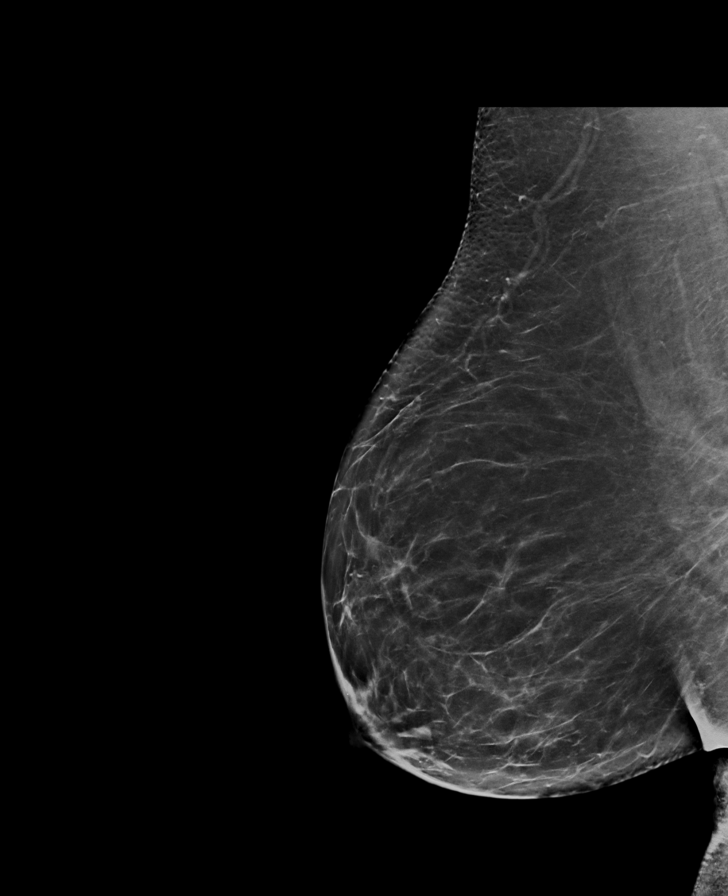

[L MLO synth-2D]
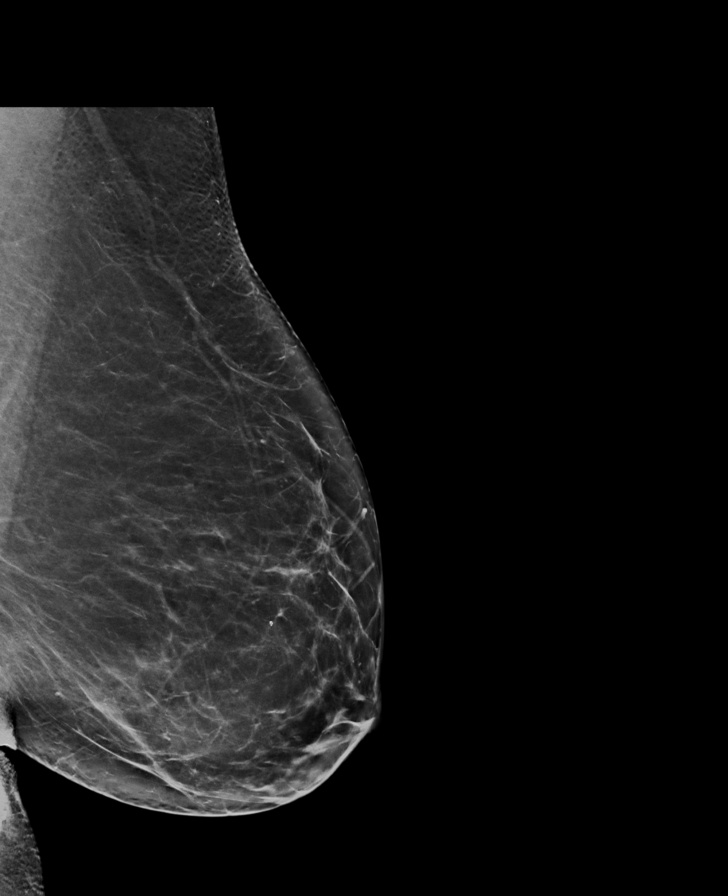

[L MLO]
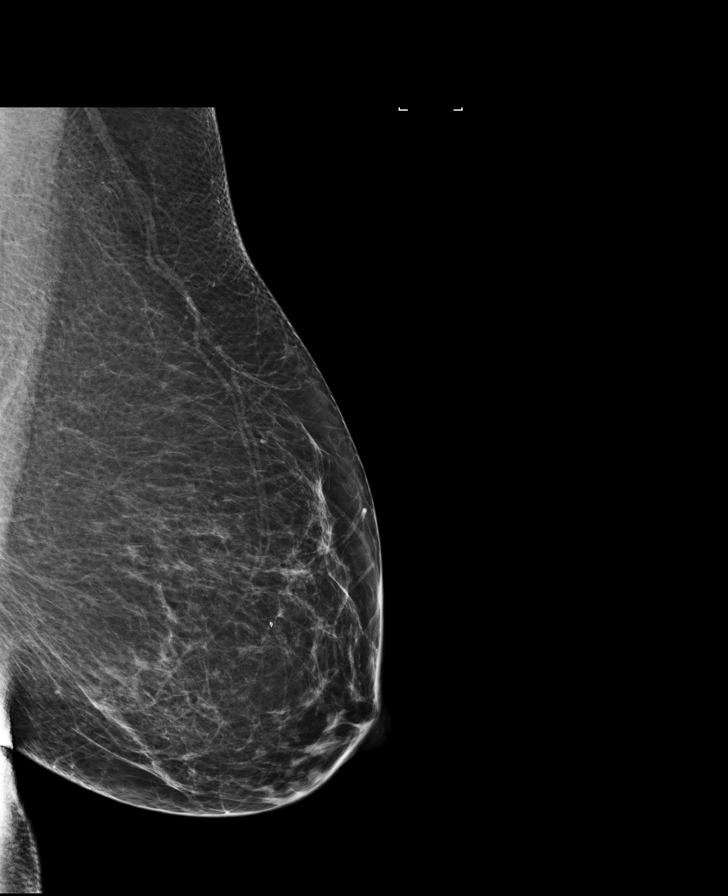

[8 of 28 positions shown; findings below may reference images not displayed]

ACR Breast Density Category b: There are scattered areas of
fibroglandular density.
FINDINGS: The previously seen small, oval, circumscribed, mass-like area in
the inferior retroareolar region of the right breast is unchanged.
No new findings suspicious for malignancy in either breast.

Mammographic images were processed with CAD.

On physical exam, no mass is palpable in the inferior retroareolar
region of the right breast.

Ultrasound is performed, showing multiple mildly to moderately
dilated retronipple ducts on the right with no cystic or solid
masses seen.
IMPRESSION: Benign right breast retronipple duct ectasia. No evidence of
malignancy.

RECOMMENDATION:
Bilateral screening mammogram in 1 year.

I have discussed the findings and recommendations with the patient.
Results were also provided in writing at the conclusion of the
visit. If applicable, a reminder letter will be sent to the patient
regarding the next appointment.

BI-RADS CATEGORY  2: Benign.

## 2016-02-16 DIAGNOSIS — M9905 Segmental and somatic dysfunction of pelvic region: Secondary | ICD-10-CM | POA: Diagnosis not present

## 2016-02-16 DIAGNOSIS — S336XXA Sprain of sacroiliac joint, initial encounter: Secondary | ICD-10-CM | POA: Diagnosis not present

## 2016-02-18 DIAGNOSIS — E039 Hypothyroidism, unspecified: Secondary | ICD-10-CM | POA: Diagnosis not present

## 2016-02-18 DIAGNOSIS — M8580 Other specified disorders of bone density and structure, unspecified site: Secondary | ICD-10-CM | POA: Diagnosis not present

## 2016-02-18 DIAGNOSIS — E559 Vitamin D deficiency, unspecified: Secondary | ICD-10-CM | POA: Diagnosis not present

## 2016-02-18 DIAGNOSIS — E538 Deficiency of other specified B group vitamins: Secondary | ICD-10-CM | POA: Diagnosis not present

## 2016-02-18 DIAGNOSIS — E785 Hyperlipidemia, unspecified: Secondary | ICD-10-CM | POA: Diagnosis not present

## 2016-02-21 DIAGNOSIS — M9905 Segmental and somatic dysfunction of pelvic region: Secondary | ICD-10-CM | POA: Diagnosis not present

## 2016-02-21 DIAGNOSIS — S336XXA Sprain of sacroiliac joint, initial encounter: Secondary | ICD-10-CM | POA: Diagnosis not present

## 2016-02-24 DIAGNOSIS — M9905 Segmental and somatic dysfunction of pelvic region: Secondary | ICD-10-CM | POA: Diagnosis not present

## 2016-02-24 DIAGNOSIS — S336XXA Sprain of sacroiliac joint, initial encounter: Secondary | ICD-10-CM | POA: Diagnosis not present

## 2016-03-02 DIAGNOSIS — E785 Hyperlipidemia, unspecified: Secondary | ICD-10-CM | POA: Diagnosis not present

## 2016-03-02 DIAGNOSIS — S336XXA Sprain of sacroiliac joint, initial encounter: Secondary | ICD-10-CM | POA: Diagnosis not present

## 2016-03-02 DIAGNOSIS — E039 Hypothyroidism, unspecified: Secondary | ICD-10-CM | POA: Diagnosis not present

## 2016-03-02 DIAGNOSIS — N182 Chronic kidney disease, stage 2 (mild): Secondary | ICD-10-CM | POA: Diagnosis not present

## 2016-03-02 DIAGNOSIS — M9905 Segmental and somatic dysfunction of pelvic region: Secondary | ICD-10-CM | POA: Diagnosis not present

## 2016-03-02 DIAGNOSIS — M8580 Other specified disorders of bone density and structure, unspecified site: Secondary | ICD-10-CM | POA: Diagnosis not present

## 2016-03-02 DIAGNOSIS — F17211 Nicotine dependence, cigarettes, in remission: Secondary | ICD-10-CM | POA: Diagnosis not present

## 2016-03-23 DIAGNOSIS — Z79899 Other long term (current) drug therapy: Secondary | ICD-10-CM | POA: Diagnosis not present

## 2016-03-23 DIAGNOSIS — M0589 Other rheumatoid arthritis with rheumatoid factor of multiple sites: Secondary | ICD-10-CM | POA: Diagnosis not present

## 2016-03-25 ENCOUNTER — Other Ambulatory Visit: Payer: Self-pay | Admitting: Obstetrics & Gynecology

## 2016-03-25 DIAGNOSIS — Z1231 Encounter for screening mammogram for malignant neoplasm of breast: Secondary | ICD-10-CM

## 2016-03-31 ENCOUNTER — Ambulatory Visit: Payer: Medicare Other

## 2016-04-12 ENCOUNTER — Ambulatory Visit
Admission: RE | Admit: 2016-04-12 | Discharge: 2016-04-12 | Disposition: A | Discharge status: Home / Self Care | Payer: Medicare Other | Source: Ambulatory Visit | Attending: Obstetrics & Gynecology | Admitting: Obstetrics & Gynecology

## 2016-04-12 DIAGNOSIS — Z1231 Encounter for screening mammogram for malignant neoplasm of breast: Secondary | ICD-10-CM

## 2016-04-13 ENCOUNTER — Other Ambulatory Visit: Payer: Self-pay | Admitting: Obstetrics & Gynecology

## 2016-04-13 DIAGNOSIS — R928 Other abnormal and inconclusive findings on diagnostic imaging of breast: Secondary | ICD-10-CM

## 2016-04-15 ENCOUNTER — Ambulatory Visit
Admission: RE | Admit: 2016-04-15 | Discharge: 2016-04-15 | Disposition: A | Discharge status: Home / Self Care | Payer: Medicare Other | Source: Ambulatory Visit | Attending: Obstetrics & Gynecology | Admitting: Obstetrics & Gynecology

## 2016-04-15 DIAGNOSIS — N6489 Other specified disorders of breast: Secondary | ICD-10-CM | POA: Diagnosis not present

## 2016-04-15 DIAGNOSIS — R928 Other abnormal and inconclusive findings on diagnostic imaging of breast: Secondary | ICD-10-CM

## 2016-05-07 ENCOUNTER — Other Ambulatory Visit: Payer: Self-pay

## 2016-05-20 DIAGNOSIS — Z23 Encounter for immunization: Secondary | ICD-10-CM | POA: Diagnosis not present

## 2016-07-09 ENCOUNTER — Ambulatory Visit (INDEPENDENT_AMBULATORY_CARE_PROVIDER_SITE_OTHER): Payer: Medicare Other

## 2016-07-09 ENCOUNTER — Ambulatory Visit (INDEPENDENT_AMBULATORY_CARE_PROVIDER_SITE_OTHER): Payer: Medicare Other | Admitting: Podiatry

## 2016-07-09 ENCOUNTER — Encounter: Payer: Self-pay | Admitting: Podiatry

## 2016-07-09 VITALS — BP 166/72 | HR 49 | Ht 67.0 in | Wt 174.0 lb

## 2016-07-09 DIAGNOSIS — R52 Pain, unspecified: Secondary | ICD-10-CM

## 2016-07-09 DIAGNOSIS — M7741 Metatarsalgia, right foot: Secondary | ICD-10-CM | POA: Diagnosis not present

## 2016-07-09 DIAGNOSIS — M7742 Metatarsalgia, left foot: Secondary | ICD-10-CM | POA: Diagnosis not present

## 2016-07-09 DIAGNOSIS — M2042 Other hammer toe(s) (acquired), left foot: Secondary | ICD-10-CM | POA: Diagnosis not present

## 2016-07-09 DIAGNOSIS — M722 Plantar fascial fibromatosis: Secondary | ICD-10-CM

## 2016-07-09 NOTE — Progress Notes (Signed)
   Subjective:    Patient ID: Bonnie [name], female    DOB: [birth year], 75 y.o.   MRN: [id number]  HPI 74 year old female presents the office today for concerns of pain to her heels with the right side worse than the left as well as for paintball the foot on the right side worse than left. She states this been ongoing for several years and has been getting worse with the right side worse. She denies any recent injury or trauma. Denies any swelling or redness. No numbness or tingling. She said no recent treatment she did put a pad in her shoe for the pain in the ball the foot which helped some she is not sure. In the correct spot. No other complaints at this time.  Review of Systems  Constitutional: Positive for fatigue.  HENT: Positive for sinus pressure.   Musculoskeletal: Positive for back pain and gait problem.  Allergic/Immunologic: Positive for environmental allergies.  Neurological: Positive for light-headedness and numbness.       Objective:   Physical Exam General: AAO x3, NAD  Dermatological: Skin is warm, dry and supple bilateral. Nails x 10 are well manicured; remaining integument appears unremarkable at this time. There are no open sores, no preulcerative lesions, no rash or signs of infection present.  Vascular: Dorsalis Pedis artery and Posterior Tibial artery pedal pulses are 2/4 bilateral with immedate capillary fill time.  There is no pain with calf compression, swelling, warmth, erythema.   Neruologic: Grossly intact via light touch bilateral. Vibratory intact via tuning fork bilateral. Protective threshold with Semmes Wienstein monofilament intact to all pedal sites bilateral.   Musculoskeletal: Tenderness to palpation along the plantar medial tubercle of the calcaneus at the insertion of plantar fascia on the right > left foot. There is no pain along the course of the plantar fascia within the arch of the foot. Plantar fascia appears to be intact. There is no pain with  lateral compression of the calcaneus or pain with vibratory sensation. There is no pain along the course or insertion of the achilles tendon. Tenderness is submetatarsal one of 5 bilaterally. There is prominent metatarsal heads plantarly and atrophy of fat pad. No other areas of tenderness to bilateral lower extremities. MMT 5/5, ROM WNL.    Gait: Unassisted, Nonantalgic.     Assessment & Plan:  75 year old female bilateral heel pain likely plantar fasciitis, metatarsalgia -Treatment options discussed including all alternatives, risks, and complications -Etiology of symptoms were discussed -X-rays were obtained and reviewed with the patient. As of acute fracture identified. -Patient elects to proceed with steroid injection into the bilateral heels. Under sterile skin preparation, a total of 2.5cc of celestone 0.5% Marcaine plain, and 2% lidocaine plain were infiltrated into the symptomatic area without complication. A band-aid was applied. Patient tolerated the injection well without complication. Post-injection care with discussed with the patient. Discussed with the patient to ice the area over the next couple of days to help prevent a steroid flare.  -Plantar fascial Braces dispensed -Discussed shoegear modifications and orthotics. -Metatarsal offloading pads were dispensed. -Follow-up as scheduled or sooner if any problems arise. In the meantime, encouraged to call the office with any questions, concerns, change in symptoms.   Celesta Gentile, DPM

## 2016-07-09 NOTE — Patient Instructions (Signed)

## 2016-07-13 DIAGNOSIS — Z79899 Other long term (current) drug therapy: Secondary | ICD-10-CM | POA: Diagnosis not present

## 2016-07-13 DIAGNOSIS — M0589 Other rheumatoid arthritis with rheumatoid factor of multiple sites: Secondary | ICD-10-CM | POA: Diagnosis not present

## 2016-07-14 ENCOUNTER — Telehealth: Payer: Self-pay | Admitting: *Deleted

## 2016-07-14 NOTE — Telephone Encounter (Addendum)
Pt states she needs instructions as to how to place the pad on her foot and the brace. 07/15/2016-Pt states someone helped her this morning with the pad and brace, but has another question. Left message informing pt, I was going to have Dr. Leigh Aurora assistant, Lattie Haw call her again, because she was the expert in his treatment padding.

## 2016-07-14 NOTE — Telephone Encounter (Signed)
Lattie Haw- can you please call her? Thank you.

## 2016-07-15 ENCOUNTER — Telehealth: Payer: Self-pay | Admitting: *Deleted

## 2016-07-15 NOTE — Telephone Encounter (Signed)
Called the patient back today and explained how the pads work and patient that she understood and the brace is helping as well as the pads and stated to the patient to call if she needed [place]. Bonnie [name]

## 2016-08-02 ENCOUNTER — Ambulatory Visit (INDEPENDENT_AMBULATORY_CARE_PROVIDER_SITE_OTHER): Payer: Medicare Other | Admitting: Podiatry

## 2016-08-02 ENCOUNTER — Encounter: Payer: Self-pay | Admitting: Podiatry

## 2016-08-02 DIAGNOSIS — M7742 Metatarsalgia, left foot: Secondary | ICD-10-CM | POA: Diagnosis not present

## 2016-08-02 DIAGNOSIS — M7741 Metatarsalgia, right foot: Secondary | ICD-10-CM

## 2016-08-02 DIAGNOSIS — M722 Plantar fascial fibromatosis: Secondary | ICD-10-CM

## 2016-08-03 NOTE — Progress Notes (Signed)
Subjective: 75 year old female presents the office they for follow-up evaluation of bilateral heel and ball of the foot pain. Overall she feels that she is getting better although she still gets some discomfort. She does not see significant improvement after the injection but she is been stretching and icing as well as changes shoes and different pads. Denies any numbness or tingling. No swelling or redness. Denies any systemic complaints such as fevers, chills, nausea, vomiting. No acute changes since last appointment, and no other complaints at this time.   Objective: AAO x3, NAD DP/PT pulses palpable bilaterally, CRT less than 3 seconds There is decreased tenderness to palpation along the submetatarsal area to bilateral feet. There is prominent metatarsal heads plantarly and atrophy of the fat pad. There is mild continued tenderness to palpation on the plantar medial tubercle of the calcaneus at the insertion of the plantar fascia. There is no pain along the arch of the foot. Plantar fascia appears intact. No other areas of tenderness bilaterally. No open lesions or pre-ulcerative lesions.  No pain with calf compression, swelling, warmth, erythema  Assessment: 75 year old female with chronic heel pain, metatarsalgia.  Plan: -All treatment options discussed with the patient including all alternatives, risks, complications.  -At this point I recommend custom inserts. She was scanned for orthotics and they're sent to [facility] labs. Also continue stretching, icing exercises daily. The first steroid injection did not help much and we will hold off on a second one. We'll try the inserts is really do. Discussed the possible EPAT, PT.  -Patient encouraged to call the office with any questions, concerns, change in symptoms.   Celesta Gentile, DPM

## 2016-08-12 DIAGNOSIS — R42 Dizziness and giddiness: Secondary | ICD-10-CM | POA: Diagnosis not present

## 2016-08-12 DIAGNOSIS — R002 Palpitations: Secondary | ICD-10-CM | POA: Diagnosis not present

## 2016-08-23 ENCOUNTER — Ambulatory Visit (INDEPENDENT_AMBULATORY_CARE_PROVIDER_SITE_OTHER): Payer: Self-pay | Admitting: Podiatry

## 2016-08-23 DIAGNOSIS — M722 Plantar fascial fibromatosis: Secondary | ICD-10-CM

## 2016-08-23 NOTE — Patient Instructions (Signed)

## 2016-08-23 NOTE — Progress Notes (Signed)
Patient presents for orthotic pick up.  Verbal and written break in and wear instructions given.  Patient will follow up in 4 weeks if symptoms worsen or fail to improve. 

## 2016-08-24 DIAGNOSIS — E785 Hyperlipidemia, unspecified: Secondary | ICD-10-CM | POA: Diagnosis not present

## 2016-08-24 DIAGNOSIS — E559 Vitamin D deficiency, unspecified: Secondary | ICD-10-CM | POA: Diagnosis not present

## 2016-08-24 DIAGNOSIS — M8580 Other specified disorders of bone density and structure, unspecified site: Secondary | ICD-10-CM | POA: Diagnosis not present

## 2016-08-24 DIAGNOSIS — E538 Deficiency of other specified B group vitamins: Secondary | ICD-10-CM | POA: Diagnosis not present

## 2016-08-24 DIAGNOSIS — Z0000 Encounter for general adult medical examination without abnormal findings: Secondary | ICD-10-CM | POA: Diagnosis not present

## 2016-08-29 NOTE — Progress Notes (Signed)
Cardiology Office Note   Date:  08/31/2016   ID:  Bonnie [name], DOB [birth year], MRN [id number]  PCP:  Thressa Sheller, MD  Cardiologist:   Minus Breeding, MD  Referring:  Thressa Sheller, MD  Chief Complaint  Patient presents with  . Dizziness     History of Present Illness: Bonnie [name] is a 75 y.o. female who presents for evaluation of palpitations and dizziness. She had a severe episode of dizziness the day after Thanksgiving. She was actually seated and jumped up to get a camera and went into one room and back going distally. She had to be helped down seat. She recovered after several minutes. She's not had this kind of episode routinely. She's not really describing orthostatic symptoms. She's not had any speech or vision issues. She does have some chronic lightheadedness. She's had palpitations which have been ongoing and slightly increased. These are more isolated skipped beats. He is not describing tachycardia arrhythmias though she might have grouped beating. These do not necessarily occur with her dizziness she's had no other episodes of presyncope or syncope. She denies any chest pressure, neck or arm discomfort. She was told to stop drinking caffeine which she has done.  Past Medical History:  Diagnosis Date  . Anemia   . Bruises easily   . Colon polyp   . Fatigue   . Hemorrhoids   . Hyperlipidemia   . Hypertension   . Menopause   . Night sweats   . Reflux   . Rheumatoid arthritis ([place]) 2007   muscle weaknes at times (Dr. Amil Amen)  . Sinus problem   . Thyroid disease   . Thyroid lump     Past Surgical History:  Procedure Laterality Date  . ABDOMINAL HYSTERECTOMY    . BLADDER REPAIR    . CHOLECYSTECTOMY    . COLONOSCOPY    . POLYPECTOMY    . THYROIDECTOMY    . UPPER GASTROINTESTINAL ENDOSCOPY    . VAGINAL PROLAPSE REPAIR       Current Outpatient Prescriptions  Medication Sig Dispense Refill  . aspirin 81 MG tablet Take 81 mg by mouth daily.       . Calcium Citrate-Vitamin D (CALCIUM CITRATE + D) 250-200 MG-UNIT TABS Take 2 tablets by mouth.     . celecoxib (CELEBREX) 200 MG capsule Take 200 mg by mouth daily as needed.    . Cyanocobalamin (B-12) 1000 MCG LOZG Take 1 lozenge by mouth daily.    . diphenhydrAMINE (BENADRYL) 25 MG tablet Take 25 mg by mouth at bedtime as needed.    . Estradiol (VAGIFEM) 10 MCG TABS Place vaginally. One tab per week     . fish oil-omega-3 fatty acids 1000 MG capsule Take 2 g by mouth daily.      . folic acid (FOLVITE) 1 MG tablet Take 2 mg by mouth daily.     . methotrexate (RHEUMATREX) 2.5 MG tablet Take 5 tablets (12.5 mg total) by mouth once a week. 5 tablet   . Multiple Vitamin (MULTIVITAMIN PO) Take by mouth daily.      . Oxycodone-Acetaminophen (PERCOCET PO) Take by mouth.    . pravastatin (PRAVACHOL) 10 MG tablet Take 10 mg by mouth daily.  0  . SYNTHROID 112 MCG tablet TAKE 1 TABLET (112 MCG TOTAL) BY MOUTH DAILY. 90 tablet 3   No current facility-administered medications for this visit.     Allergies:   Neosporin [neomycin-bacitracin zn-polymyx]; Seasonal ic [cholestatin]; and Triamcinolone    Social  History:  The patient  reports that she quit smoking about 42 years ago. Her smoking use included Cigarettes. She has a 5.00 pack-year smoking history. She has never used smokeless tobacco. She reports that she drinks alcohol. She reports that she does not use drugs.   Family History:  The patient's family history includes Hypertension in her mother.   Her father was bombed by the [place] in error when he was a prisoner of war in [place].  He died in this bombing.  He met her for only a few minutes the night she was born and then shipped out.    ROS:  Please see the history of present illness.   Otherwise, review of systems are positive for none.   All other systems are reviewed and negative.    PHYSICAL EXAM: VS:  BP 140/82 (BP Location: Left Arm, Patient Position: Sitting, Cuff Size: Normal)    Pulse 63   Ht '5\' 7"'$  (1.702 m)   Wt 177 lb 6.4 oz (80.5 kg)   BMI 27.78 kg/m  , BMI Body mass index is 27.78 kg/m. GENERAL:  Well appearing HEENT:  Pupils equal round and reactive, fundi not visualized, oral mucosa unremarkable NECK:  No jugular venous distention, waveform within normal limits, carotid upstroke brisk and symmetric, no bruits, no thyromegaly LYMPHATICS:  No cervical, inguinal adenopathy LUNGS:  Clear to auscultation bilaterally BACK:  No CVA tenderness CHEST:  Unremarkable HEART:  PMI not displaced or sustained,S1 and S2 within normal limits, no S3, no S4, no clicks, no rubs, soft brief apical systolic murmur, no diastolic murmurs ABD:  Flat, positive bowel sounds normal in frequency in pitch, no bruits, no rebound, no guarding, no midline pulsatile mass, no hepatomegaly, no splenomegaly EXT:  2 plus pulses throughout, no edema, no cyanosis no clubbing SKIN:  No rashes no nodules NEURO:  Cranial nerves II through XII grossly intact, motor grossly intact throughout PSYCH:  Cognitively intact, oriented to person place and time    EKG:  EKG is ordered today. The ekg ordered today demonstrates sinus bradycardia, rate 55, axis within normal limits, intervals within normal limits, no acute ST-T wave changes.   Recent Labs: No results found for requested labs within last 8760 hours.    Lipid Panel    Component Value Date/Time   CHOL 221 (H) 12/04/2012 1123   TRIG 72.0 12/04/2012 1123   HDL 61.10 12/04/2012 1123   CHOLHDL 4 12/04/2012 1123   VLDL 14.4 12/04/2012 1123   LDLCALC 124 (H) 12/15/2010 0830   LDLDIRECT 155.5 12/04/2012 1123     Lab Results  Component Value Date   TSH 0.56 12/04/2012     Wt Readings from Last 3 Encounters:  08/30/16 177 lb 6.4 oz (80.5 kg)  07/09/16 174 lb (78.9 kg)  06/03/15 172 lb (78 kg)      Other studies Reviewed: Additional studies/ records that were reviewed today include: None. Review of the above records demonstrates:   Please see elsewhere in the note.     ASSESSMENT AND PLAN:  DIZZINESS:  She was not orthostatic.  At this point I suspect that this was either related to a drop in BP for that one episode or possible vertigo.  It has improved.  No change in therapy is planned.    PALPITATIONS:    I have suggested that the patient purchase an Alive Cor device and I will be happy to review this.  I will check with her PCP to see if she  has recent labs to include TSH.  She will limit caffeine .    Current medicines are reviewed at length with the patient today.  The patient does not have concerns regarding medicines.  The following changes have been made:  no change  Labs/ tests ordered today include:   Orders Placed This Encounter  Procedures  . EKG 12-Lead     Disposition:   FU with me as needed    Signed, Minus Breeding, MD  08/31/2016 11:52 PM    [place]

## 2016-08-30 ENCOUNTER — Ambulatory Visit (INDEPENDENT_AMBULATORY_CARE_PROVIDER_SITE_OTHER): Payer: Medicare Other | Admitting: Cardiology

## 2016-08-30 ENCOUNTER — Encounter: Payer: Self-pay | Admitting: Cardiology

## 2016-08-30 VITALS — BP 140/82 | HR 63 | Ht 67.0 in | Wt 177.4 lb

## 2016-08-30 DIAGNOSIS — R42 Dizziness and giddiness: Secondary | ICD-10-CM | POA: Diagnosis not present

## 2016-08-30 DIAGNOSIS — R002 Palpitations: Secondary | ICD-10-CM

## 2016-08-30 NOTE — Patient Instructions (Addendum)
Medication Instructions:  Continue current medications  Labwork: None Ordered  Testing/Procedures: None Ordered  Follow-Up: Your physician recommends that you schedule a follow-up appointment in: 3 Months   Any Other Special Instructions Will Be Listed Below (If Applicable).      [place]   If you need a refill on your cardiac medications before your next appointment, please call your pharmacy.

## 2016-08-31 ENCOUNTER — Encounter: Payer: Self-pay | Admitting: Cardiology

## 2016-08-31 DIAGNOSIS — M0589 Other rheumatoid arthritis with rheumatoid factor of multiple sites: Secondary | ICD-10-CM | POA: Diagnosis not present

## 2016-08-31 DIAGNOSIS — E039 Hypothyroidism, unspecified: Secondary | ICD-10-CM | POA: Diagnosis not present

## 2016-08-31 DIAGNOSIS — N182 Chronic kidney disease, stage 2 (mild): Secondary | ICD-10-CM | POA: Diagnosis not present

## 2016-08-31 DIAGNOSIS — E785 Hyperlipidemia, unspecified: Secondary | ICD-10-CM | POA: Diagnosis not present

## 2016-09-02 ENCOUNTER — Ambulatory Visit: Payer: Medicare Other | Admitting: Cardiology

## 2016-09-03 ENCOUNTER — Telehealth: Payer: Self-pay | Admitting: Cardiology

## 2016-09-03 NOTE — Telephone Encounter (Signed)
Pt was told to by Dr. Percival Spanish to buy a "cardiogram" where she can do the test at home-wants to know what to do with the results ?  keep them, send them, have Dr. Percival Spanish read them? pls advise [phone]

## 2016-09-03 NOTE — Telephone Encounter (Signed)
Pt notified to do testing only when she is having symptoms that were discussed with Dr Percival Spanish or any concerning symptoms and forward PDF fil via my chart and call to let us know when she sends it so we can forward if Dr Percival Spanish is not available

## 2016-11-14 NOTE — Progress Notes (Signed)
Cardiology Office Note   Date:  11/16/2016   ID:  SEVILLE WANTZ, DOB 30-Jul-1941, MRN [id number]  PCP:  Thressa Sheller, MD  Cardiologist:   Minus Breeding, MD  Referring:  Thressa Sheller, MD  Chief Complaint  Patient presents with  . Palpitations     History of Present Illness: Bonnie [name] is a 76 y.o. female who presents for evaluation of palpitations and dizziness.  At the last visit I suggested that she purchase an Alive Cor to record her symptoms.  She returns for follow up.  She's had none of the severe dizzy episode that she had around Thanksgiving. She did get an Financial controller and has not had any arrhythmias that she could capture. She does have occasional palpitations. She otherwise feels okay.  Past Medical History:  Diagnosis Date  . Anemia   . Bruises easily   . Colon polyp   . Fatigue   . Hemorrhoids   . Hyperlipidemia   . Hypertension   . Menopause   . Night sweats   . Reflux   . Rheumatoid arthritis ([place]) 2007   muscle weaknes at times (Dr. Amil Amen)  . Sinus problem   . Thyroid disease   . Thyroid lump     Past Surgical History:  Procedure Laterality Date  . ABDOMINAL HYSTERECTOMY    . BLADDER REPAIR    . CHOLECYSTECTOMY    . COLONOSCOPY    . POLYPECTOMY    . THYROIDECTOMY    . UPPER GASTROINTESTINAL ENDOSCOPY    . VAGINAL PROLAPSE REPAIR       Current Outpatient Prescriptions  Medication Sig Dispense Refill  . aspirin 81 MG tablet Take 81 mg by mouth daily.      . Calcium Citrate-Vitamin D (CALCIUM CITRATE + D) 250-200 MG-UNIT TABS Take 2 tablets by mouth.     . celecoxib (CELEBREX) 200 MG capsule Take 200 mg by mouth daily as needed.    . Cyanocobalamin (B-12) 1000 MCG LOZG Take 1 lozenge by mouth daily.    . diphenhydrAMINE (BENADRYL) 25 MG tablet Take 25 mg by mouth at bedtime as needed.    . Estradiol (VAGIFEM) 10 MCG TABS Place vaginally. One tab per week     . fish oil-omega-3 fatty acids 1000 MG capsule Take 2 g by mouth daily.       . folic acid (FOLVITE) 1 MG tablet Take 2 mg by mouth daily.     . methotrexate 2.5 MG tablet Take 10 mg by mouth once a week.    . Multiple Vitamin (MULTIVITAMIN PO) Take by mouth daily.      . Oxycodone-Acetaminophen (PERCOCET PO) Take by mouth as needed.     . pravastatin (PRAVACHOL) 10 MG tablet Take 10 mg by mouth daily.  0  . SYNTHROID 112 MCG tablet TAKE 1 TABLET (112 MCG TOTAL) BY MOUTH DAILY. 90 tablet 3   No current facility-administered medications for this visit.     Allergies:   Neosporin [neomycin-bacitracin zn-polymyx]; Seasonal ic [cholestatin]; and Triamcinolone    ROS:  Please see the history of present illness.   Otherwise, review of systems are positive for none.   All other systems are reviewed and negative.    PHYSICAL EXAM: VS:  BP 138/74   Pulse 60   Ht 5\' 7"  (1.702 m)   Wt 179 lb (81.2 kg)   BMI 28.04 kg/m  , BMI Body mass index is 28.04 kg/m. GENERAL:  Well appearing NECK:  No jugular venous  distention, waveform within normal limits, carotid upstroke brisk and symmetric, no bruits, no thyromegaly LUNGS:  Clear to auscultation bilaterally CHEST:  Unremarkable HEART:  PMI not displaced or sustained,S1 and S2 within normal limits, no S3, no S4, no clicks, no rubs, soft brief apical systolic murmur, no diastolic murmurs ABD:  Flat, positive bowel sounds normal in frequency in pitch, no bruits, no rebound, no guarding, no midline pulsatile mass, no hepatomegaly, no splenomegaly EXT:  2 plus pulses throughout, no edema, no cyanosis no clubbing    EKG:  EKG is not ordered today.   Recent Labs: No results found for requested labs within last 8760 hours.    Lipid Panel    Component Value Date/Time   CHOL 221 (H) 12/04/2012 1123   TRIG 72.0 12/04/2012 1123   HDL 61.10 12/04/2012 1123   CHOLHDL 4 12/04/2012 1123   VLDL 14.4 12/04/2012 1123   LDLCALC 124 (H) 12/15/2010 0830   LDLDIRECT 155.5 12/04/2012 1123     Lab Results  Component Value Date     TSH 0.56 12/04/2012     Wt Readings from Last 3 Encounters:  11/15/16 179 lb (81.2 kg)  08/30/16 177 lb 6.4 oz (80.5 kg)  07/09/16 174 lb (78.9 kg)      Other studies Reviewed: Additional studies/ records that were reviewed today include: None . Review of the above records demonstrates:       ASSESSMENT AND PLAN:  DIZZINESS:    She was not orthostatic.  At this point I suspect that this was either related to a drop in BP for that one episode or possible vertigo.  It has improved.  No change in therapy is planned.  She has had no further episodes.  PALPITATIONS:   She had no significant arrhythmias.  She has some palpitations but prefers conservative therapy.  No change in meds is planned.    Current medicines are reviewed at length with the patient today.  The patient does not have concerns regarding medicines.  The following changes have been made:  None  Labs/ tests ordered today include:   None  No orders of the defined types were placed in this encounter.    Disposition:   FU with me as needed.    Signed, Minus Breeding, MD  11/16/2016 12:36 PM    [facility]

## 2016-11-15 ENCOUNTER — Ambulatory Visit (INDEPENDENT_AMBULATORY_CARE_PROVIDER_SITE_OTHER): Payer: Medicare Other | Admitting: Cardiology

## 2016-11-15 VITALS — BP 138/74 | HR 60 | Ht 67.0 in | Wt 179.0 lb

## 2016-11-15 DIAGNOSIS — R002 Palpitations: Secondary | ICD-10-CM | POA: Diagnosis not present

## 2016-11-15 DIAGNOSIS — R42 Dizziness and giddiness: Secondary | ICD-10-CM | POA: Diagnosis not present

## 2016-11-15 NOTE — Patient Instructions (Signed)
Medication Instructions:  Continue current medications  Labwork: None Ordered  Testing/Procedures: None Ordered  Follow-Up: Your physician recommends that you schedule a follow-up appointment in: As Needed   Any Other Special Instructions Will Be Listed Below (If Applicable).   If you need a refill on your cardiac medications before your next appointment, please call your pharmacy.   

## 2016-11-16 ENCOUNTER — Encounter: Payer: Self-pay | Admitting: Cardiology

## 2016-11-17 DIAGNOSIS — L821 Other seborrheic keratosis: Secondary | ICD-10-CM | POA: Diagnosis not present

## 2016-11-17 DIAGNOSIS — D225 Melanocytic nevi of trunk: Secondary | ICD-10-CM | POA: Diagnosis not present

## 2016-11-17 DIAGNOSIS — D1801 Hemangioma of skin and subcutaneous tissue: Secondary | ICD-10-CM | POA: Diagnosis not present

## 2016-11-17 DIAGNOSIS — L814 Other melanin hyperpigmentation: Secondary | ICD-10-CM | POA: Diagnosis not present

## 2016-11-17 DIAGNOSIS — D2272 Melanocytic nevi of left lower limb, including hip: Secondary | ICD-10-CM | POA: Diagnosis not present

## 2016-11-17 DIAGNOSIS — D2261 Melanocytic nevi of right upper limb, including shoulder: Secondary | ICD-10-CM | POA: Diagnosis not present

## 2016-11-17 DIAGNOSIS — L84 Corns and callosities: Secondary | ICD-10-CM | POA: Diagnosis not present

## 2016-11-17 DIAGNOSIS — D2371 Other benign neoplasm of skin of right lower limb, including hip: Secondary | ICD-10-CM | POA: Diagnosis not present

## 2016-11-17 DIAGNOSIS — D2262 Melanocytic nevi of left upper limb, including shoulder: Secondary | ICD-10-CM | POA: Diagnosis not present

## 2016-12-08 DIAGNOSIS — M0589 Other rheumatoid arthritis with rheumatoid factor of multiple sites: Secondary | ICD-10-CM | POA: Diagnosis not present

## 2016-12-08 DIAGNOSIS — Z6828 Body mass index (BMI) 28.0-28.9, adult: Secondary | ICD-10-CM | POA: Diagnosis not present

## 2016-12-08 DIAGNOSIS — Z79899 Other long term (current) drug therapy: Secondary | ICD-10-CM | POA: Diagnosis not present

## 2017-02-08 IMAGING — MG MM SCREENING BREAST TOMO BILATERAL
8 series · 8 of 24 positions shown · non-contrast
Comparison: Previous exam(s).

CLINICAL DATA: Screening.

EXAM:
DIGITAL SCREENING BILATERAL MAMMOGRAM WITH 3D TOMO WITH CAD

[L CC]
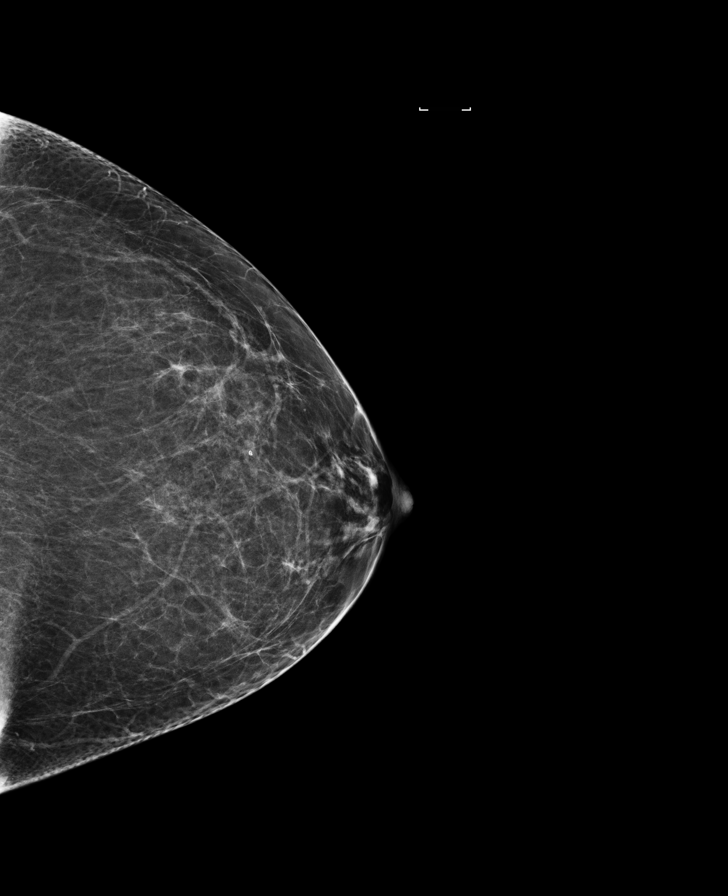

[R CC]
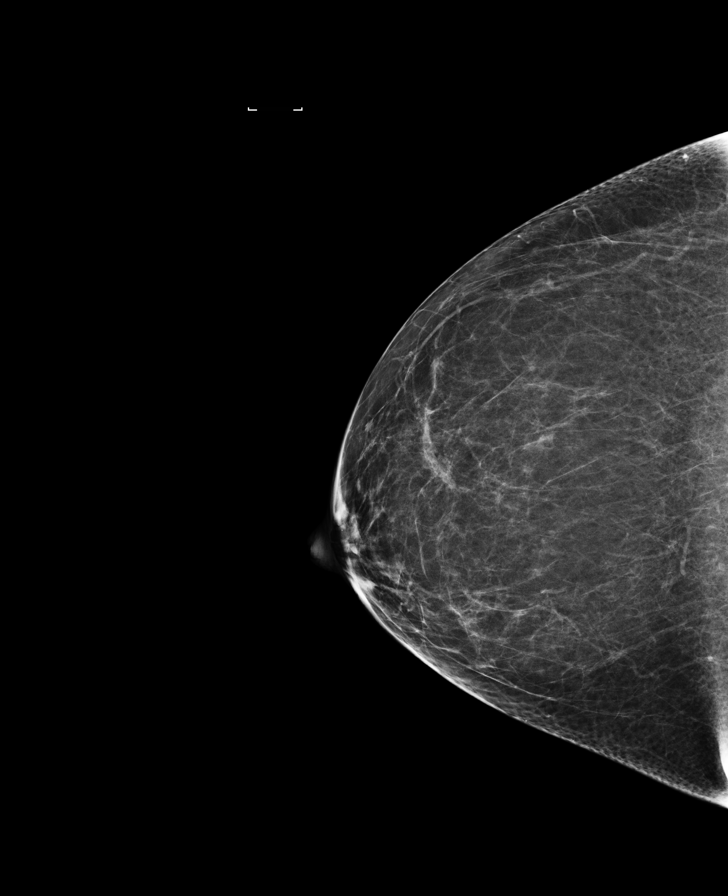

[R MLO]
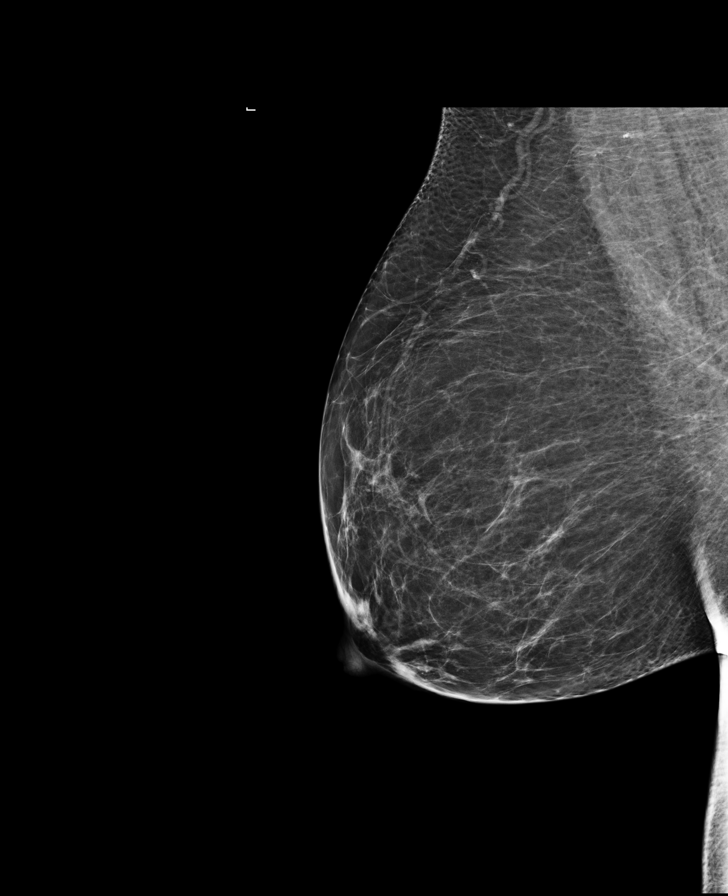

[L MLO]
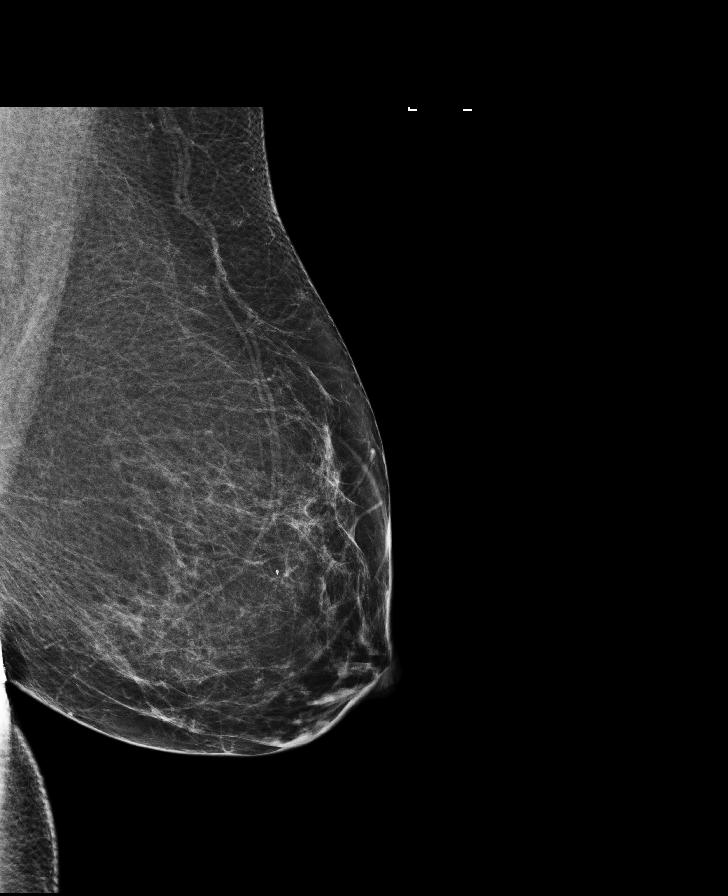

[L MLO tomo · tomo slice 37/74.0]
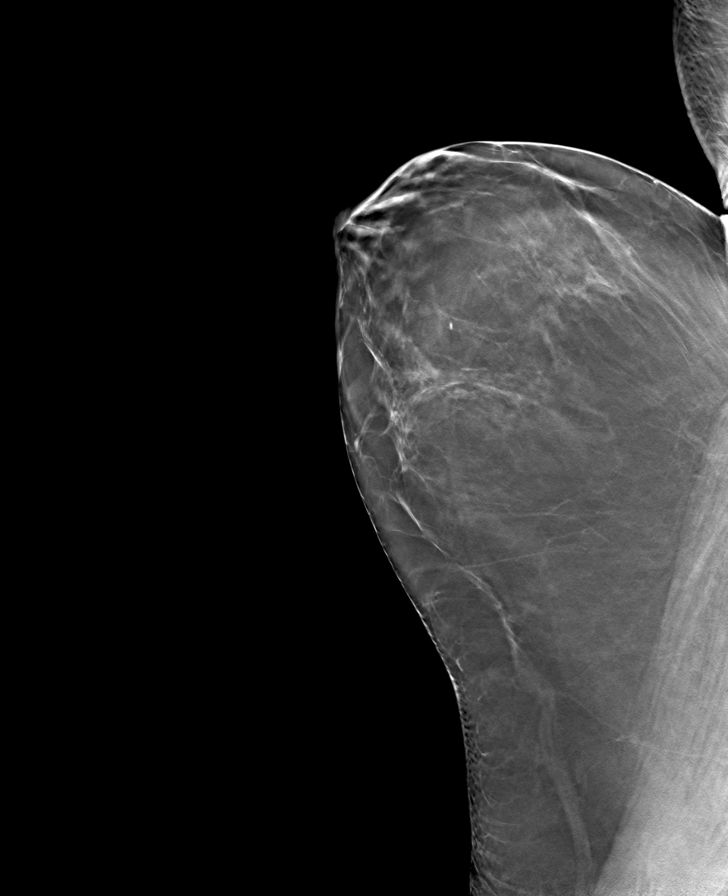

[L CC tomo · tomo slice 31/62.0]
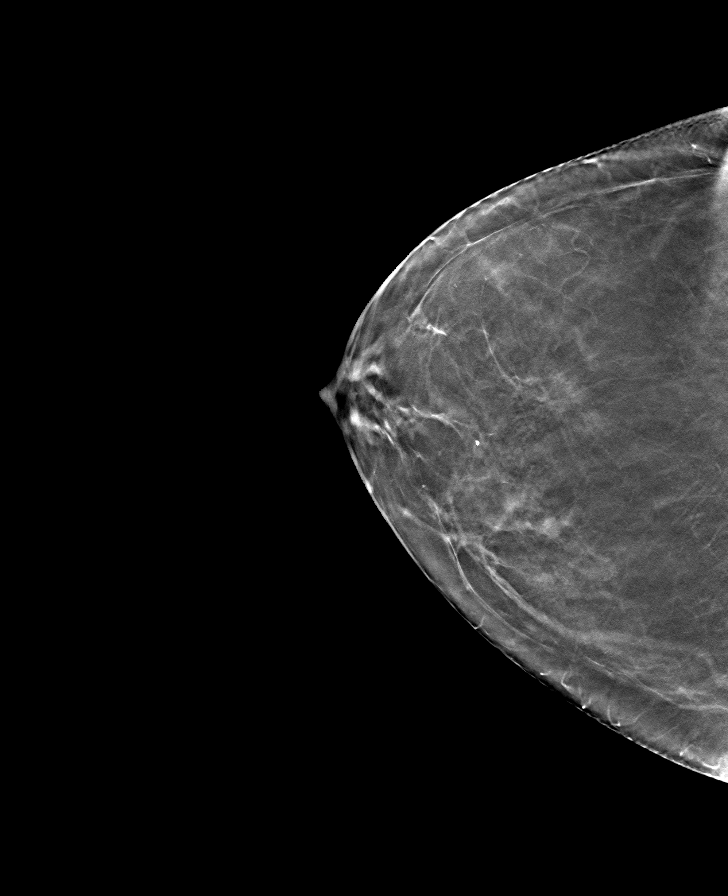

[R MLO tomo · tomo slice 37/72.0]
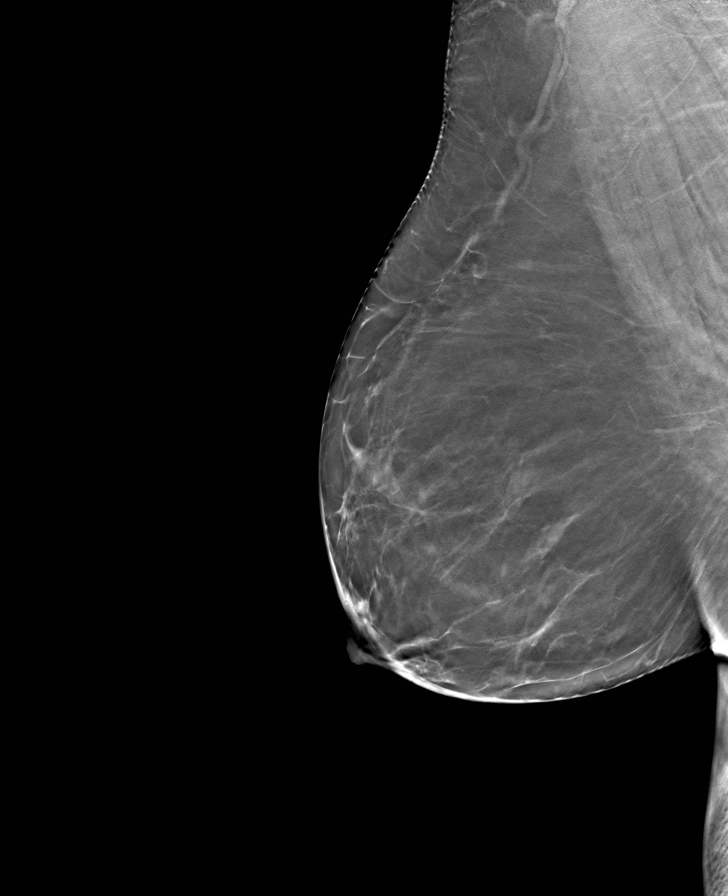

[R CC tomo · tomo slice 33/64.0]
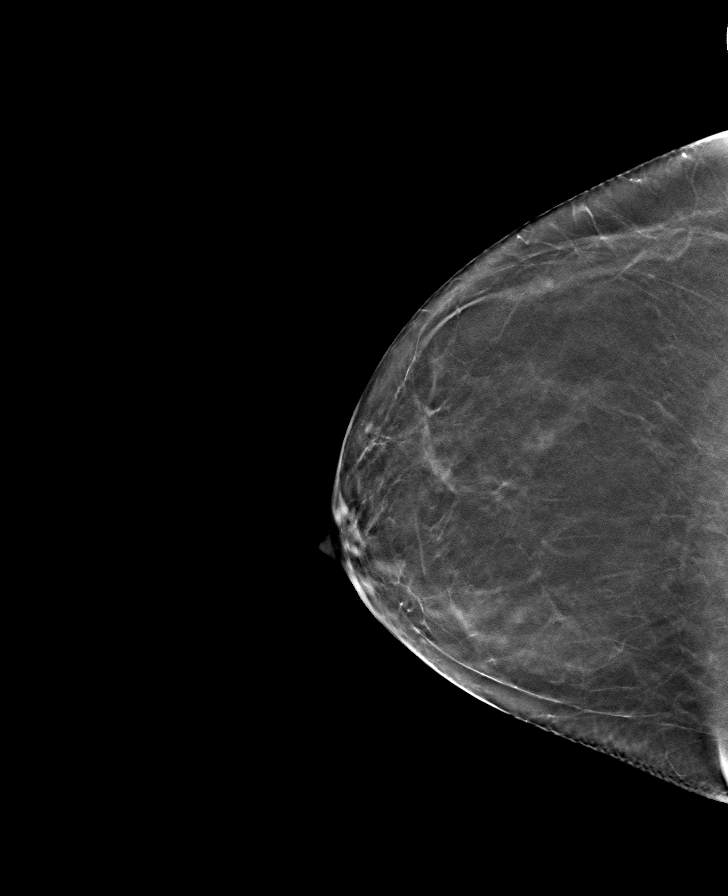

[8 of 24 positions shown; findings below may reference images not displayed]

ACR Breast Density Category b: There are scattered areas of
fibroglandular density.
FINDINGS: There are no findings suspicious for malignancy. Images were
processed with CAD.
IMPRESSION: No mammographic evidence of malignancy. A result letter of this
screening mammogram will be mailed directly to the patient.

RECOMMENDATION:
Screening mammogram in one year. (Code:55-L-23V)

BI-RADS CATEGORY  1: Negative.

## 2017-02-10 DIAGNOSIS — S338XXA Sprain of other parts of lumbar spine and pelvis, initial encounter: Secondary | ICD-10-CM | POA: Diagnosis not present

## 2017-02-10 DIAGNOSIS — M9903 Segmental and somatic dysfunction of lumbar region: Secondary | ICD-10-CM | POA: Diagnosis not present

## 2017-02-15 DIAGNOSIS — M9903 Segmental and somatic dysfunction of lumbar region: Secondary | ICD-10-CM | POA: Diagnosis not present

## 2017-02-15 DIAGNOSIS — S338XXA Sprain of other parts of lumbar spine and pelvis, initial encounter: Secondary | ICD-10-CM | POA: Diagnosis not present

## 2017-02-17 DIAGNOSIS — M9903 Segmental and somatic dysfunction of lumbar region: Secondary | ICD-10-CM | POA: Diagnosis not present

## 2017-02-17 DIAGNOSIS — S338XXA Sprain of other parts of lumbar spine and pelvis, initial encounter: Secondary | ICD-10-CM | POA: Diagnosis not present

## 2017-02-21 ENCOUNTER — Encounter: Payer: Self-pay | Admitting: Podiatry

## 2017-02-21 ENCOUNTER — Ambulatory Visit (INDEPENDENT_AMBULATORY_CARE_PROVIDER_SITE_OTHER): Payer: Medicare Other | Admitting: Podiatry

## 2017-02-21 VITALS — BP 157/67 | HR 51

## 2017-02-21 DIAGNOSIS — M79671 Pain in right foot: Secondary | ICD-10-CM

## 2017-02-21 DIAGNOSIS — L851 Acquired keratosis [keratoderma] palmaris et plantaris: Secondary | ICD-10-CM

## 2017-02-21 DIAGNOSIS — M7741 Metatarsalgia, right foot: Secondary | ICD-10-CM

## 2017-02-21 DIAGNOSIS — M79672 Pain in left foot: Secondary | ICD-10-CM

## 2017-02-21 DIAGNOSIS — M7742 Metatarsalgia, left foot: Secondary | ICD-10-CM

## 2017-02-21 NOTE — Progress Notes (Signed)
Subjective: 76 year old female presents accompanied by her husband complaining of pain under the ball of both feet R>L duration of several weeks. Currently she is using custom made orthotics since November 2017 prescribed by Dr. Jacqualyn Posey.   Objective: Multiple contracted lesser digits bilateral. Porokeratotic lesion under 2nd MPJ right, broad callus under 2nd MPJ left.  Neurovascular status are within normal.  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 Thin plantar fat pad R>L.  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  Plan: Reviewed findings and available treatment options. Lesions debrided. Continue with custom orthotics. Return as needed.

## 2017-02-21 NOTE — Patient Instructions (Addendum)
Seen for pain in ball of both feet R>L. Noted of plantar callus R>L. Affected area debrided and padded. Return as needed.

## 2017-02-22 ENCOUNTER — Ambulatory Visit: Payer: Medicare Other | Admitting: Podiatry

## 2017-02-22 ENCOUNTER — Encounter: Payer: Self-pay | Admitting: Podiatry

## 2017-02-22 DIAGNOSIS — E785 Hyperlipidemia, unspecified: Secondary | ICD-10-CM | POA: Diagnosis not present

## 2017-02-22 DIAGNOSIS — I10 Essential (primary) hypertension: Secondary | ICD-10-CM | POA: Diagnosis not present

## 2017-02-22 DIAGNOSIS — E559 Vitamin D deficiency, unspecified: Secondary | ICD-10-CM | POA: Diagnosis not present

## 2017-02-22 DIAGNOSIS — E538 Deficiency of other specified B group vitamins: Secondary | ICD-10-CM | POA: Diagnosis not present

## 2017-02-22 DIAGNOSIS — E039 Hypothyroidism, unspecified: Secondary | ICD-10-CM | POA: Diagnosis not present

## 2017-03-01 DIAGNOSIS — N182 Chronic kidney disease, stage 2 (mild): Secondary | ICD-10-CM | POA: Diagnosis not present

## 2017-03-01 DIAGNOSIS — E785 Hyperlipidemia, unspecified: Secondary | ICD-10-CM | POA: Diagnosis not present

## 2017-03-01 DIAGNOSIS — E039 Hypothyroidism, unspecified: Secondary | ICD-10-CM | POA: Diagnosis not present

## 2017-03-01 DIAGNOSIS — M8580 Other specified disorders of bone density and structure, unspecified site: Secondary | ICD-10-CM | POA: Diagnosis not present

## 2017-04-19 DIAGNOSIS — Z6827 Body mass index (BMI) 27.0-27.9, adult: Secondary | ICD-10-CM | POA: Diagnosis not present

## 2017-04-19 DIAGNOSIS — N8110 Cystocele, unspecified: Secondary | ICD-10-CM | POA: Diagnosis not present

## 2017-04-19 DIAGNOSIS — Z1231 Encounter for screening mammogram for malignant neoplasm of breast: Secondary | ICD-10-CM | POA: Diagnosis not present

## 2017-04-19 DIAGNOSIS — N959 Unspecified menopausal and perimenopausal disorder: Secondary | ICD-10-CM | POA: Diagnosis not present

## 2017-05-12 DIAGNOSIS — E890 Postprocedural hypothyroidism: Secondary | ICD-10-CM | POA: Insufficient documentation

## 2017-06-22 DIAGNOSIS — Z23 Encounter for immunization: Secondary | ICD-10-CM | POA: Diagnosis not present

## 2017-06-29 DIAGNOSIS — M791 Myalgia, unspecified site: Secondary | ICD-10-CM | POA: Diagnosis not present

## 2017-06-29 DIAGNOSIS — E663 Overweight: Secondary | ICD-10-CM | POA: Diagnosis not present

## 2017-06-29 DIAGNOSIS — M0589 Other rheumatoid arthritis with rheumatoid factor of multiple sites: Secondary | ICD-10-CM | POA: Diagnosis not present

## 2017-06-29 DIAGNOSIS — Z79899 Other long term (current) drug therapy: Secondary | ICD-10-CM | POA: Diagnosis not present

## 2017-06-29 DIAGNOSIS — Z6827 Body mass index (BMI) 27.0-27.9, adult: Secondary | ICD-10-CM | POA: Diagnosis not present

## 2017-09-15 DIAGNOSIS — E538 Deficiency of other specified B group vitamins: Secondary | ICD-10-CM | POA: Diagnosis not present

## 2017-09-15 DIAGNOSIS — Z0000 Encounter for general adult medical examination without abnormal findings: Secondary | ICD-10-CM | POA: Diagnosis not present

## 2017-09-15 DIAGNOSIS — D81818 Other biotin-dependent carboxylase deficiency: Secondary | ICD-10-CM | POA: Diagnosis not present

## 2017-09-15 DIAGNOSIS — E785 Hyperlipidemia, unspecified: Secondary | ICD-10-CM | POA: Diagnosis not present

## 2017-09-15 DIAGNOSIS — N390 Urinary tract infection, site not specified: Secondary | ICD-10-CM | POA: Diagnosis not present

## 2017-09-15 DIAGNOSIS — E559 Vitamin D deficiency, unspecified: Secondary | ICD-10-CM | POA: Diagnosis not present

## 2017-09-15 DIAGNOSIS — E039 Hypothyroidism, unspecified: Secondary | ICD-10-CM | POA: Diagnosis not present

## 2017-09-19 ENCOUNTER — Telehealth: Payer: Self-pay | Admitting: Gastroenterology

## 2017-09-19 DIAGNOSIS — E785 Hyperlipidemia, unspecified: Secondary | ICD-10-CM | POA: Diagnosis not present

## 2017-09-19 DIAGNOSIS — M8580 Other specified disorders of bone density and structure, unspecified site: Secondary | ICD-10-CM | POA: Diagnosis not present

## 2017-09-19 DIAGNOSIS — Z86010 Personal history of colonic polyps: Secondary | ICD-10-CM | POA: Diagnosis not present

## 2017-09-19 DIAGNOSIS — F17211 Nicotine dependence, cigarettes, in remission: Secondary | ICD-10-CM | POA: Diagnosis not present

## 2017-09-19 DIAGNOSIS — E538 Deficiency of other specified B group vitamins: Secondary | ICD-10-CM | POA: Diagnosis not present

## 2017-09-19 DIAGNOSIS — E039 Hypothyroidism, unspecified: Secondary | ICD-10-CM | POA: Diagnosis not present

## 2017-09-19 DIAGNOSIS — N182 Chronic kidney disease, stage 2 (mild): Secondary | ICD-10-CM | POA: Diagnosis not present

## 2017-09-19 DIAGNOSIS — M859 Disorder of bone density and structure, unspecified: Secondary | ICD-10-CM | POA: Diagnosis not present

## 2017-09-19 DIAGNOSIS — M0589 Other rheumatoid arthritis with rheumatoid factor of multiple sites: Secondary | ICD-10-CM | POA: Diagnosis not present

## 2017-09-19 NOTE — Telephone Encounter (Signed)
I explained to the patient that due to her history of pre-cancerous polyps she is not a candidate for Cologuard.  She has concerns about the sedation for a colonoscopy. She will come in and speak with Dr. Fuller Plan about alternatives.

## 2017-09-26 DIAGNOSIS — H02001 Unspecified entropion of right upper eyelid: Secondary | ICD-10-CM | POA: Diagnosis not present

## 2017-09-29 DIAGNOSIS — M0589 Other rheumatoid arthritis with rheumatoid factor of multiple sites: Secondary | ICD-10-CM | POA: Diagnosis not present

## 2017-10-24 ENCOUNTER — Ambulatory Visit: Payer: Medicare Other | Admitting: Gastroenterology

## 2017-11-01 ENCOUNTER — Encounter: Payer: Self-pay | Admitting: Gastroenterology

## 2017-11-01 ENCOUNTER — Ambulatory Visit (INDEPENDENT_AMBULATORY_CARE_PROVIDER_SITE_OTHER): Payer: Medicare Other | Admitting: Gastroenterology

## 2017-11-01 VITALS — BP 120/70 | HR 62 | Ht 67.0 in | Wt 173.0 lb

## 2017-11-01 DIAGNOSIS — E890 Postprocedural hypothyroidism: Secondary | ICD-10-CM | POA: Diagnosis not present

## 2017-11-01 DIAGNOSIS — Z86010 Personal history of colonic polyps: Secondary | ICD-10-CM | POA: Diagnosis not present

## 2017-11-01 MED ORDER — NA SULFATE-K SULFATE-MG SULF 17.5-3.13-1.6 GM/177ML PO SOLN: 1.0000 | Freq: Once | ORAL | 0 refills | Status: AC

## 2017-11-01 NOTE — Patient Instructions (Addendum)
You have been scheduled for a colonoscopy. Please follow written instructions given to you at your visit today.  Please pick up your prep supplies at the pharmacy within the next 1-3 days. If you use inhalers (even only as needed), please bring them with you on the day of your procedure. Your physician has requested that you go to www.[email or web address].com and enter the access code given to you at your visit today. This web site gives a general overview about your procedure. However, you should still follow specific instructions given to you by our office regarding your preparation for the procedure.  Thank you for choosing me and [facility].  Malcolm T. Stark, Jr., MD., FACG  

## 2017-11-01 NOTE — Progress Notes (Signed)
History of Present Illness: This is a 77 year old female here for the evaluation of a personal history of adenomatous colon polyps.  She has no ongoing gastrointestinal complaints.  She had questions about other methods available for colon polyp surveillance including Cologuard.  She is in the process of changing primary care physicians to Dr. Domenick Gong. Denies weight loss, abdominal pain, constipation, diarrhea, change in stool caliber, melena, hematochezia, nausea, vomiting, dysphagia, reflux symptoms, chest pain.   Colonoscopy 10/2012: diverticulosis, internal hemorrhoids, 2 adenomatous colon polyps  Allergies  Allergen Reactions  . Neosporin [Neomycin-Bacitracin Zn-Polymyx] Other (See Comments)    Patient states that wounds or scratches never heal. Patient states that wounds or scratches never heal.  . Seasonal Ic [Cholestatin]   . Triamcinolone Other (See Comments)    Burning sensation   Outpatient Medications Prior to Visit  Medication Sig Dispense Refill  . aspirin 81 MG tablet Take 81 mg by mouth daily.      . calcium carbonate (TUMS - DOSED IN MG ELEMENTAL CALCIUM) 500 MG chewable tablet Chew 1 tablet by mouth daily.    . Calcium Citrate-Vitamin D (CALCIUM CITRATE + D) 250-200 MG-UNIT TABS Take 2 tablets by mouth.     . celecoxib (CELEBREX) 200 MG capsule Take 200 mg by mouth daily as needed.    . Cyanocobalamin (B-12) 1000 MCG LOZG Take 1 lozenge by mouth 2 (two) times daily.     . diphenhydrAMINE (BENADRYL) 25 MG tablet Take 25 mg by mouth at bedtime as needed.    . Estradiol (VAGIFEM) 10 MCG TABS Place vaginally. One tab per week     . fish oil-omega-3 fatty acids 1000 MG capsule Take 2 g by mouth daily.      . folic acid (FOLVITE) 1 MG tablet Take 2 mg by mouth daily.     . methotrexate 2.5 MG tablet Take 12.5 mg by mouth once a week.     . Multiple Vitamin (MULTIVITAMIN PO) Take by mouth daily.      . Oxycodone-Acetaminophen (PERCOCET PO) Take by mouth as needed.      Marland Kitchen SYNTHROID 112 MCG tablet TAKE 1 TABLET (112 MCG TOTAL) BY MOUTH DAILY. 90 tablet 3  . pravastatin (PRAVACHOL) 10 MG tablet Take 10 mg by mouth daily.  0   No facility-administered medications prior to visit.    Past Medical History:  Diagnosis Date  . Anemia   . Bruises easily   . Colon polyp   . Fatigue   . Hemorrhoids   . Hyperlipidemia   . Hypertension   . Menopause   . Night sweats   . Reflux   . Rheumatoid arthritis ([place]) 2007   muscle weaknes at times (Dr. Amil Amen)  . Sinus problem   . Thyroid disease   . Thyroid lump    Past Surgical History:  Procedure Laterality Date  . ABDOMINAL HYSTERECTOMY    . BLADDER REPAIR    . CHOLECYSTECTOMY    . COLONOSCOPY    . POLYPECTOMY    . THYROIDECTOMY    . UPPER GASTROINTESTINAL ENDOSCOPY    . VAGINAL PROLAPSE REPAIR     Social History   Socioeconomic History  . Marital status: Married    Spouse name: None  . Number of children: 5  . Years of education: None  . Highest education level: None  Social Needs  . Financial resource strain: None  . Food insecurity - worry: None  . Food insecurity - inability: None  .  Transportation needs - medical: None  . Transportation needs - non-medical: None  Occupational History  . None  Tobacco Use  . Smoking status: Former Smoker    Packs/day: 0.50    Years: 10.00    Pack years: 5.00    Types: Cigarettes    Last attempt to quit: 11/19/1973    Years since quitting: 43.9  . Smokeless tobacco: Never Used  Substance and Sexual Activity  . Alcohol use: Yes    Alcohol/week: 0.0 oz    Comment: very rare glass of wine  . Drug use: No  . Sexual activity: Yes  Other Topics Concern  . None  Social History Narrative   Lives with husband.    Family History  Problem Relation Age of Onset  . Hypertension Mother   . Colon cancer Neg Hx   . Esophageal cancer Neg Hx   . Stomach cancer Neg Hx   . Rectal cancer Neg Hx       Review of Systems: Pertinent positive and negative  review of systems were noted in the above HPI section. All other review of systems were otherwise negative.   Physical Exam: General: Well developed, well nourished, no acute distress Head: Normocephalic and atraumatic Eyes:  sclerae anicteric, EOMI Ears: Normal auditory acuity Mouth: No deformity or lesions Neck: Supple, no masses or thyromegaly Lungs: Clear throughout to auscultation Heart: Regular rate and rhythm; no murmurs, rubs or bruits Abdomen: Soft, non tender and non distended. No masses, hepatosplenomegaly or hernias noted. Normal Bowel sounds Rectal: deferred to colonoscopy Musculoskeletal: Symmetrical with no gross deformities  Skin: No lesions on visible extremities Pulses:  Normal pulses noted Extremities: No clubbing, cyanosis, edema or deformities noted Neurological: Alert oriented x 4, grossly nonfocal Cervical Nodes:  No significant cervical adenopathy Inguinal Nodes: No significant inguinal adenopathy Psychological:  Alert and cooperative. Normal mood and affect  Assessment and Recommendations:  1. Personal history of adenomatous polyps.  After clarifying that Cologuard is only indicated for average risk CRC screening (not for polyp surveillance or high risk screening), further discussing her history of precancerous colon polyps, further discussing the standard colonoscopy intervals and the age guidelines she is agreeable to proceed with colonoscopy.  Schedule colonoscopy.  The risks (including bleeding, perforation, infection, missed lesions, medication reactions and possible hospitalization or surgery if complications occur), benefits, and alternatives to colonoscopy with possible biopsy and possible polypectomy were discussed with the patient and they consent to proceed.     cc: Domenick Gong, MD

## 2017-11-07 ENCOUNTER — Encounter: Payer: Self-pay | Admitting: Gastroenterology

## 2017-11-07 ENCOUNTER — Other Ambulatory Visit: Payer: Self-pay

## 2017-11-07 ENCOUNTER — Ambulatory Visit (AMBULATORY_SURGERY_CENTER): Payer: Medicare Other | Admitting: Gastroenterology

## 2017-11-07 VITALS — BP 143/66 | HR 49 | Temp 96.8°F | Resp 13 | Ht 67.0 in | Wt 173.0 lb

## 2017-11-07 DIAGNOSIS — M069 Rheumatoid arthritis, unspecified: Secondary | ICD-10-CM | POA: Diagnosis not present

## 2017-11-07 DIAGNOSIS — Z86010 Personal history of colonic polyps: Secondary | ICD-10-CM

## 2017-11-07 DIAGNOSIS — D120 Benign neoplasm of cecum: Secondary | ICD-10-CM | POA: Diagnosis not present

## 2017-11-07 DIAGNOSIS — D123 Benign neoplasm of transverse colon: Secondary | ICD-10-CM | POA: Diagnosis not present

## 2017-11-07 DIAGNOSIS — E039 Hypothyroidism, unspecified: Secondary | ICD-10-CM | POA: Diagnosis not present

## 2017-11-07 DIAGNOSIS — I10 Essential (primary) hypertension: Secondary | ICD-10-CM | POA: Diagnosis not present

## 2017-11-07 HISTORY — PX: COLONOSCOPY: SHX174

## 2017-11-07 MED ORDER — SODIUM CHLORIDE 0.9 % IV SOLN: 500.0000 mL | INTRAVENOUS | Status: DC

## 2017-11-07 NOTE — Patient Instructions (Signed)
**  Handouts given on Polyps, Diverticulosis, and High fiber diet**   YOU HAD AN ENDOSCOPIC PROCEDURE TODAY: Refer to the procedure report and other information in the discharge instructions given to you for any specific questions about what was found during the examination. If this information does not answer your questions, please call [place] office at [phone] to clarify.   YOU SHOULD EXPECT: Some feelings of bloating in the abdomen. Passage of more gas than usual. Walking can help get rid of the air that was put into your GI tract during the procedure and reduce the bloating. If you had a lower endoscopy (such as a colonoscopy or flexible sigmoidoscopy) you may notice spotting of blood in your stool or on the toilet paper. Some abdominal soreness may be present for a day or two, also.  DIET: Your first meal following the procedure should be a light meal and then it is ok to progress to your normal diet. A half-sandwich or bowl of soup is an example of a good first meal. Heavy or fried foods are harder to digest and may make you feel nauseous or bloated. Drink plenty of fluids but you should avoid alcoholic beverages for 24 hours. If you had a esophageal dilation, please see attached instructions for diet.    ACTIVITY: Your care partner should take you home directly after the procedure. You should plan to take it easy, moving slowly for the rest of the day. You can resume normal activity the day after the procedure however YOU SHOULD NOT DRIVE, use power tools, machinery or perform tasks that involve climbing or major physical exertion for 24 hours (because of the sedation medicines used during the test).   SYMPTOMS TO REPORT IMMEDIATELY: A gastroenterologist can be reached at any hour. Please call [phone]  for any of the following symptoms:  Following lower endoscopy (colonoscopy, flexible sigmoidoscopy) Excessive amounts of blood in the stool  Significant tenderness, worsening of  abdominal pains  Swelling of the abdomen that is new, acute  Fever of 100 or higher    FOLLOW UP:  If any biopsies were taken you will be contacted by phone or by letter within the next 1-3 weeks. Call [phone]  if you have not heard about the biopsies in 3 weeks.  Please also call with any specific questions about appointments or follow up tests.

## 2017-11-07 NOTE — Progress Notes (Signed)
To recovery, report to RN, VSS. 

## 2017-11-07 NOTE — Op Note (Signed)
[facility] Patient Name: Bonnie [name] Procedure Date: 11/07/2017 8:01 AM MRN: [id number] Endoscopist: Ladene Artist , MD Age: 77 Referring MD:  Date of Birth: 10/04/1940 Gender: Female Account #: [id number] Procedure:                Colonoscopy Indications:              Surveillance: Personal history of adenomatous                            polyps on last colonoscopy 5 years ago Medicines:                Monitored Anesthesia Care Procedure:                Pre-Anesthesia Assessment:                           - Prior to the procedure, a History and Physical                            was performed, and patient medications and                            allergies were reviewed. The patient's tolerance of                            previous anesthesia was also reviewed. The risks                            and benefits of the procedure and the sedation                            options and risks were discussed with the patient.                            All questions were answered, and informed consent                            was obtained. Prior Anticoagulants: The patient has                            taken no previous anticoagulant or antiplatelet                            agents. ASA Grade Assessment: II - A patient with                            mild systemic disease. After reviewing the risks                            and benefits, the patient was deemed in                            satisfactory condition to undergo the procedure.  After obtaining informed consent, the colonoscope                            was passed under direct vision. Throughout the                            procedure, the patient's blood pressure, pulse, and                            oxygen saturations were monitored continuously. The                            Colonoscope was introduced through the anus and                            advanced to the the  cecum, identified by                            appendiceal orifice and ileocecal valve. The                            ileocecal valve, appendiceal orifice, and rectum                            were photographed. The quality of the bowel                            preparation was good. The colonoscopy was performed                            without difficulty. The patient tolerated the                            procedure well. Scope In: 8:08:14 AM Scope Out: 8:28:28 AM Scope Withdrawal Time: 0 hours 15 minutes 3 seconds  Total Procedure Duration: 0 hours 20 minutes 14 seconds  Findings:                 The perianal and digital rectal examinations were                            normal.                           A 7 mm polyp was found in the transverse colon. The                            polyp was sessile. The polyp was removed with a                            cold snare. Resection and retrieval were complete.                           A 4 mm polyp was found in the ileocecal valve. The  polyp was sessile. The polyp was removed with a                            cold biopsy forceps. Resection and retrieval were                            complete.                           Many small-mouthed diverticula were found in the                            left colon. There was no evidence of diverticular                            bleeding.                           Internal hemorrhoids were found during                            retroflexion. The hemorrhoids were small and Grade                            I (internal hemorrhoids that do not prolapse).                           The exam was otherwise without abnormality on                            direct and retroflexion views. Complications:            No immediate complications. Estimated blood loss:                            None. Estimated Blood Loss:     Estimated blood loss: none. Impression:                - One 7 mm polyp in the transverse colon, removed                            with a cold snare. Resected and retrieved.                           - One 4 mm polyp at the ileocecal valve, removed                            with a cold biopsy forceps. Resected and retrieved.                           - Mild diverticulosis in the left colon. There was                            no evidence of diverticular bleeding.                           -  Internal hemorrhoids.                           - The examination was otherwise normal on direct                            and retroflexion views. Recommendation:           - Patient has a contact number available for                            emergencies. The signs and symptoms of potential                            delayed complications were discussed with the                            patient. Return to normal activities tomorrow.                            Written discharge instructions were provided to the                            patient.                           - Hihg fiber diet.                           - Continue present medications.                           - Await pathology results.                           - No repeat colonoscopy due to age. Ladene Artist, MD 11/07/2017 8:32:54 AM This report has been signed electronically.

## 2017-11-07 NOTE — Progress Notes (Signed)
Called to room to assist during endoscopic procedure.  Patient ID and intended procedure confirmed with present staff. Received instructions for my participation in the procedure from the performing physician.  

## 2017-11-07 NOTE — Progress Notes (Signed)
Pt. Reports no change in her medical or surgical history since her pre-visit 11/01/2017.

## 2017-11-08 ENCOUNTER — Telehealth: Payer: Self-pay

## 2017-11-08 NOTE — Telephone Encounter (Signed)
  Follow up Call-  Call back number 11/07/2017  Post procedure Call Back phone  # [phone]  Permission to leave phone message Yes  Some recent data might be hidden     Patient questions:  Do you have a fever, pain , or abdominal swelling? No. Pain Score  0 *  Have you tolerated food without any problems? Yes.    Have you been able to return to your normal activities? Yes.    Do you have any questions about your discharge instructions: Diet   No. Medications  No. Follow up visit  No.  Do you have questions or concerns about your Care? No.  Actions: * If pain score is 4 or above: No action needed, pain <4.  Pt. Asked about hemorrhoids and if she needed an additional appt with Dr. Fuller Plan to have them removed.  She states that they are not that painful and only flare up occasionally.  Advised pt to keep her bowels soft and moving and to avoid straining to reduce inflammation.  She agreed and said that sounded fine to her.

## 2017-11-10 DIAGNOSIS — E890 Postprocedural hypothyroidism: Secondary | ICD-10-CM | POA: Diagnosis not present

## 2017-11-12 ENCOUNTER — Encounter: Payer: Self-pay | Admitting: Gastroenterology

## 2017-12-16 DIAGNOSIS — M069 Rheumatoid arthritis, unspecified: Secondary | ICD-10-CM | POA: Diagnosis not present

## 2017-12-16 DIAGNOSIS — E039 Hypothyroidism, unspecified: Secondary | ICD-10-CM | POA: Diagnosis not present

## 2017-12-16 DIAGNOSIS — Z6827 Body mass index (BMI) 27.0-27.9, adult: Secondary | ICD-10-CM | POA: Diagnosis not present

## 2017-12-16 DIAGNOSIS — E7800 Pure hypercholesterolemia, unspecified: Secondary | ICD-10-CM | POA: Diagnosis not present

## 2017-12-16 DIAGNOSIS — M791 Myalgia, unspecified site: Secondary | ICD-10-CM | POA: Diagnosis not present

## 2017-12-22 DIAGNOSIS — Z6826 Body mass index (BMI) 26.0-26.9, adult: Secondary | ICD-10-CM | POA: Diagnosis not present

## 2017-12-22 DIAGNOSIS — E663 Overweight: Secondary | ICD-10-CM | POA: Diagnosis not present

## 2017-12-22 DIAGNOSIS — M0589 Other rheumatoid arthritis with rheumatoid factor of multiple sites: Secondary | ICD-10-CM | POA: Diagnosis not present

## 2017-12-22 DIAGNOSIS — M791 Myalgia, unspecified site: Secondary | ICD-10-CM | POA: Diagnosis not present

## 2017-12-22 DIAGNOSIS — Z79899 Other long term (current) drug therapy: Secondary | ICD-10-CM | POA: Diagnosis not present

## 2018-01-04 DIAGNOSIS — D225 Melanocytic nevi of trunk: Secondary | ICD-10-CM | POA: Diagnosis not present

## 2018-01-04 DIAGNOSIS — D2261 Melanocytic nevi of right upper limb, including shoulder: Secondary | ICD-10-CM | POA: Diagnosis not present

## 2018-01-04 DIAGNOSIS — D2371 Other benign neoplasm of skin of right lower limb, including hip: Secondary | ICD-10-CM | POA: Diagnosis not present

## 2018-01-04 DIAGNOSIS — D485 Neoplasm of uncertain behavior of skin: Secondary | ICD-10-CM | POA: Diagnosis not present

## 2018-01-04 DIAGNOSIS — L814 Other melanin hyperpigmentation: Secondary | ICD-10-CM | POA: Diagnosis not present

## 2018-01-04 DIAGNOSIS — D2262 Melanocytic nevi of left upper limb, including shoulder: Secondary | ICD-10-CM | POA: Diagnosis not present

## 2018-01-04 DIAGNOSIS — D1801 Hemangioma of skin and subcutaneous tissue: Secondary | ICD-10-CM | POA: Diagnosis not present

## 2018-01-04 DIAGNOSIS — L720 Epidermal cyst: Secondary | ICD-10-CM | POA: Diagnosis not present

## 2018-01-04 DIAGNOSIS — L821 Other seborrheic keratosis: Secondary | ICD-10-CM | POA: Diagnosis not present

## 2018-02-09 ENCOUNTER — Encounter: Payer: Self-pay | Admitting: Podiatry

## 2018-02-09 ENCOUNTER — Ambulatory Visit (INDEPENDENT_AMBULATORY_CARE_PROVIDER_SITE_OTHER): Payer: Medicare Other | Admitting: Podiatry

## 2018-02-09 DIAGNOSIS — M7741 Metatarsalgia, right foot: Secondary | ICD-10-CM | POA: Diagnosis not present

## 2018-02-09 DIAGNOSIS — L851 Acquired keratosis [keratoderma] palmaris et plantaris: Secondary | ICD-10-CM

## 2018-02-09 DIAGNOSIS — M7742 Metatarsalgia, left foot: Secondary | ICD-10-CM | POA: Diagnosis not present

## 2018-02-09 DIAGNOSIS — M79672 Pain in left foot: Secondary | ICD-10-CM

## 2018-02-09 DIAGNOSIS — M79671 Pain in right foot: Secondary | ICD-10-CM

## 2018-02-09 NOTE — Patient Instructions (Signed)
Seen for painful feet. All painful lesions debrided. Return as needed.

## 2018-02-09 NOTE — Progress Notes (Signed)
Subjective: 77 y.o. year old female accompanied by her husband presents complaining of painful feet. Bottom of both feet hurt just like the last time. Denies any new problems.  Objective: Dermatologic: Porokeratotic lesion under 2nd MPJ, symptomatic bilateral. Vascular: Pedal pulses are all palpable. Orthopedic: Contracted lesser digits 2nd bilateral. Thin plantar fat pad bilateral. High arched cavus type foot bilateral.  Neurologic: All epicritic and tactile sensations grossly intact.  Assessment: Porokeratosis plantar bilateral. Metatarsalgia bilateral. Pain in both feet.  Treatment: Reviewed clinical findings and available treatment options. Proper shoe gear discussed. Continue with her old [employer] pl[employer]. All painful lesions debrided.  Return needed.

## 2018-03-15 DIAGNOSIS — E038 Other specified hypothyroidism: Secondary | ICD-10-CM | POA: Diagnosis not present

## 2018-03-15 DIAGNOSIS — R413 Other amnesia: Secondary | ICD-10-CM | POA: Diagnosis not present

## 2018-03-15 DIAGNOSIS — F0280 Dementia in other diseases classified elsewhere without behavioral disturbance: Secondary | ICD-10-CM | POA: Diagnosis not present

## 2018-03-15 DIAGNOSIS — M069 Rheumatoid arthritis, unspecified: Secondary | ICD-10-CM | POA: Diagnosis not present

## 2018-03-15 DIAGNOSIS — Z6826 Body mass index (BMI) 26.0-26.9, adult: Secondary | ICD-10-CM | POA: Diagnosis not present

## 2018-03-28 DIAGNOSIS — M0589 Other rheumatoid arthritis with rheumatoid factor of multiple sites: Secondary | ICD-10-CM | POA: Diagnosis not present

## 2018-05-09 DIAGNOSIS — Z6825 Body mass index (BMI) 25.0-25.9, adult: Secondary | ICD-10-CM | POA: Diagnosis not present

## 2018-05-09 DIAGNOSIS — Z1231 Encounter for screening mammogram for malignant neoplasm of breast: Secondary | ICD-10-CM | POA: Diagnosis not present

## 2018-06-16 DIAGNOSIS — R82998 Other abnormal findings in urine: Secondary | ICD-10-CM | POA: Diagnosis not present

## 2018-06-16 DIAGNOSIS — E7800 Pure hypercholesterolemia, unspecified: Secondary | ICD-10-CM | POA: Diagnosis not present

## 2018-06-16 DIAGNOSIS — Z0000 Encounter for general adult medical examination without abnormal findings: Secondary | ICD-10-CM | POA: Diagnosis not present

## 2018-06-16 DIAGNOSIS — E038 Other specified hypothyroidism: Secondary | ICD-10-CM | POA: Diagnosis not present

## 2018-06-22 DIAGNOSIS — Z79899 Other long term (current) drug therapy: Secondary | ICD-10-CM | POA: Diagnosis not present

## 2018-06-22 DIAGNOSIS — E663 Overweight: Secondary | ICD-10-CM | POA: Diagnosis not present

## 2018-06-22 DIAGNOSIS — M0589 Other rheumatoid arthritis with rheumatoid factor of multiple sites: Secondary | ICD-10-CM | POA: Diagnosis not present

## 2018-06-22 DIAGNOSIS — M791 Myalgia, unspecified site: Secondary | ICD-10-CM | POA: Diagnosis not present

## 2018-06-22 DIAGNOSIS — Z6825 Body mass index (BMI) 25.0-25.9, adult: Secondary | ICD-10-CM | POA: Diagnosis not present

## 2018-06-23 DIAGNOSIS — Z1389 Encounter for screening for other disorder: Secondary | ICD-10-CM | POA: Diagnosis not present

## 2018-06-23 DIAGNOSIS — M791 Myalgia, unspecified site: Secondary | ICD-10-CM | POA: Diagnosis not present

## 2018-06-23 DIAGNOSIS — F321 Major depressive disorder, single episode, moderate: Secondary | ICD-10-CM | POA: Diagnosis not present

## 2018-06-23 DIAGNOSIS — Z23 Encounter for immunization: Secondary | ICD-10-CM | POA: Diagnosis not present

## 2018-06-23 DIAGNOSIS — F0280 Dementia in other diseases classified elsewhere without behavioral disturbance: Secondary | ICD-10-CM | POA: Diagnosis not present

## 2018-06-23 DIAGNOSIS — M069 Rheumatoid arthritis, unspecified: Secondary | ICD-10-CM | POA: Diagnosis not present

## 2018-06-23 DIAGNOSIS — R413 Other amnesia: Secondary | ICD-10-CM | POA: Diagnosis not present

## 2018-06-23 DIAGNOSIS — E038 Other specified hypothyroidism: Secondary | ICD-10-CM | POA: Diagnosis not present

## 2018-06-23 DIAGNOSIS — E7800 Pure hypercholesterolemia, unspecified: Secondary | ICD-10-CM | POA: Diagnosis not present

## 2018-06-23 DIAGNOSIS — Z0000 Encounter for general adult medical examination without abnormal findings: Secondary | ICD-10-CM | POA: Diagnosis not present

## 2018-06-23 DIAGNOSIS — Z6826 Body mass index (BMI) 26.0-26.9, adult: Secondary | ICD-10-CM | POA: Diagnosis not present

## 2018-07-14 DIAGNOSIS — I5189 Other ill-defined heart diseases: Secondary | ICD-10-CM

## 2018-07-14 DIAGNOSIS — I214 Non-ST elevation (NSTEMI) myocardial infarction: Secondary | ICD-10-CM

## 2018-07-14 DIAGNOSIS — I2510 Atherosclerotic heart disease of native coronary artery without angina pectoris: Secondary | ICD-10-CM

## 2018-07-14 HISTORY — DX: Atherosclerotic heart disease of native coronary artery without angina pectoris: I25.10

## 2018-07-14 HISTORY — DX: Non-ST elevation (NSTEMI) myocardial infarction: I21.4

## 2018-07-14 HISTORY — DX: Other ill-defined heart diseases: I51.89

## 2018-08-04 ENCOUNTER — Ambulatory Visit (INDEPENDENT_AMBULATORY_CARE_PROVIDER_SITE_OTHER): Payer: Medicare Other | Admitting: Family Medicine

## 2018-08-04 ENCOUNTER — Encounter: Payer: Self-pay | Admitting: Family Medicine

## 2018-08-04 VITALS — BP 121/72 | HR 52 | Temp 98.1°F | Resp 16 | Ht 66.5 in | Wt 160.1 lb

## 2018-08-04 DIAGNOSIS — Z23 Encounter for immunization: Secondary | ICD-10-CM

## 2018-08-04 DIAGNOSIS — E7800 Pure hypercholesterolemia, unspecified: Secondary | ICD-10-CM | POA: Diagnosis not present

## 2018-08-04 DIAGNOSIS — M069 Rheumatoid arthritis, unspecified: Secondary | ICD-10-CM | POA: Diagnosis not present

## 2018-08-04 DIAGNOSIS — E890 Postprocedural hypothyroidism: Secondary | ICD-10-CM | POA: Diagnosis not present

## 2018-08-04 NOTE — Progress Notes (Signed)
Office Note 08/07/2018  CC:  Chief Complaint  Patient presents with  . Establish Care    Previous PCP: Dr. Osborne Casco  . Letter    for attorney    HPI:  Bonnie [name] is a 77 y.o. female who is here to establish care Patient's most recent primary MD: see above. Old records in EPIC/[facility] EMR were reviewed prior to or during today's visit.  Mammogram UTD. No pap's b/c she is s/p remote hysterectomy for DUB.  Occ home bp monitoring shows 120s/70s. She saw an endocrinologist a few times for her postop hypothyroidism, doesn't need to go again. She sees rheum MD q64mo: this has been well controlled on methotrexate; hands and wrists are primary joints affected. HLD: taking rosuvastatin (all other statins caused muscle pain).  She is tolerating this well.  Most recent f/u with PCP/labs was approx 12/2017. She doesn't know about vaccine update at this time but wants flu vaccine today.  Past Medical History:  Diagnosis Date  . Beta thalassemia, heterozygous    ?alpha? pt does not recall.  . Fatigue   . GERD (gastroesophageal reflux disease)   . Hemorrhoids   . History of adenomatous polyp of colon   . Hyperlipidemia   . 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  Surgical path NEG for atypica or malignancy.  . Rheumatoid arthritis ([place]) 2007   muscle weaknes at times (Dr. Amil Amen)    Past Surgical History:  Procedure Laterality Date  . ABDOMINAL HYSTERECTOMY  1987   DUB, no malignancy.  Ovaries still in.  Marland Kitchen BLADDER REPAIR    . CHOLECYSTECTOMY  1969  . COLONOSCOPY  11/07/2017   2014 adenoma.  Rpt 10/2017 adenoma x 1.  No further colonoscopies needed per GI.   Marland Kitchen THYROIDECTOMY  2012   cytologic atypia on nodule bx; surgical path NEG for atypica or malignancy.  Marland Kitchen UPPER GASTROINTESTINAL ENDOSCOPY    . VAGINAL PROLAPSE  REPAIR  2012    Family History  Problem Relation Age of Onset  . Hypertension Mother   . CVA Mother   . Colon cancer Neg Hx   . Esophageal cancer Neg Hx   . Stomach cancer Neg Hx   . Rectal cancer Neg Hx     Social History   Socioeconomic History  . Marital status: Married    Spouse name: Not on file  . Number of children: 5  . Years of education: Not on file  . Highest education level: Not on file  Occupational History  . Not on file  Social Needs  . Financial resource strain: Not on file  . Food insecurity:    Worry: Not on file    Inability: Not on file  . Transportation needs:    Medical: Not on file    Non-medical: Not on file  Tobacco Use  . Smoking status: Former Smoker    Packs/day: 0.50    Years: 10.00    Pack years: 5.00    Types: Cigarettes    Last attempt to quit: 11/19/1973    Years since quitting: 44.7  . Smokeless tobacco: Never Used  Substance and Sexual Activity  . Alcohol use: Yes    Alcohol/week: 0.0 standard drinks    Comment: very rare glass of wine  . Drug use: No  . Sexual activity: Yes  Lifestyle  . Physical activity:  Days per week: Not on file    Minutes per session: Not on file  . Stress: Not on file  Relationships  . Social connections:    Talks on phone: Not on file    Gets together: Not on file    Attends religious service: Not on file    Active member of club or organization: Not on file    Attends meetings of clubs or organizations: Not on file    Relationship status: Not on file  . Intimate partner violence:    Fear of current or ex partner: Not on file    Emotionally abused: Not on file    Physically abused: Not on file    Forced sexual activity: Not on file  Other Topics Concern  . Not on file  Social History Narrative   Lives with husband in [place].   Has 5 children.   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    Facility-Administered Encounter Medications as  of 08/04/2018  Medication  . [DISCONTINUED] 0.9 %  sodium chloride infusion   Outpatient Encounter Medications as of 08/04/2018  Medication Sig  . aspirin 81 MG tablet Take 81 mg by mouth daily.    . Calcium Citrate-Vitamin D (CALCIUM CITRATE + D PO) Take 2 tablets by mouth daily. Calcium 600mg /Vitamin D3 1600mg   . celecoxib (CELEBREX) 200 MG capsule Take 200 mg by mouth daily as needed.  . Cholecalciferol (VITAMIN D3) 125 MCG (5000 UT) CAPS Take 1 capsule by mouth daily.  . Coenzyme Q10 (COQ-10) 100 MG CAPS Take 1 capsule by mouth daily.  . Cyanocobalamin (B-12) 500 MCG TABS Take 1 tablet by mouth daily.   . diphenhydrAMINE (BENADRYL) 25 MG tablet Take 25 mg by mouth at bedtime as needed.  . Estradiol (VAGIFEM) 10 MCG TABS Place vaginally. One tab per week   . fish oil-omega-3 fatty acids 1000 MG capsule Take 2 g by mouth daily.    . folic acid (FOLVITE) 1 MG tablet Take 2 mg by mouth daily.   Marland Kitchen ibuprofen (ADVIL,MOTRIN) 400 MG tablet Take 400 mg by mouth every 4 (four) hours as needed.  Marland Kitchen levothyroxine (SYNTHROID) 100 MCG tablet Take 1 tablet by mouth daily.  . Lutein-Zeaxanthin 25-5 MG CAPS Take 1 capsule by mouth daily.  . Magnesium 250 MG TABS Take 1 tablet by mouth daily.  . methotrexate 2.5 MG tablet Take 10 mg by mouth once a week.   . Multiple Vitamin (MULTIVITAMIN PO) Take by mouth daily.    . rosuvastatin (CRESTOR) 5 MG tablet Take 1 tablet by mouth daily.  . [DISCONTINUED] calcium carbonate (TUMS - DOSED IN MG ELEMENTAL CALCIUM) 500 MG chewable tablet Chew 1 tablet by mouth daily.  . [DISCONTINUED] Oxycodone-Acetaminophen (PERCOCET PO) Take by mouth as needed.   . [DISCONTINUED] pravastatin (PRAVACHOL) 10 MG tablet Take 10 mg by mouth daily.  . [DISCONTINUED] SYNTHROID 112 MCG tablet TAKE 1 TABLET (112 MCG TOTAL) BY MOUTH DAILY. (Patient not taking: Reported on 08/04/2018)    Allergies  Allergen Reactions  . Neosporin [Neomycin-Bacitracin Zn-Polymyx] Other (See Comments)     Patient states that wounds or scratches never heal. Patient states that wounds or scratches never heal.  . Seasonal Ic [Cholestatin]   . Triamcinolone Other (See Comments)    Burning sensation  . Atorvastatin Other (See Comments)  . Monascus Purpureus [employer] Other (See Comments)  . Pravastatin Other (See Comments)  Aches   ROS Review of Systems  Constitutional: Negative for fatigue and fever.  HENT: Negative for congestion and sore throat.   Eyes: Negative for visual disturbance.  Respiratory: Negative for cough.   Cardiovascular: Negative for chest pain.  Gastrointestinal: Negative for abdominal pain and nausea.  Genitourinary: Negative for dysuria.  Musculoskeletal: Negative for back pain and joint swelling.  Skin: Negative for rash.  Neurological: Negative for weakness and headaches.  Hematological: Negative for adenopathy.    PE; Blood pressure 121/72, pulse (!) 52, temperature 98.1 F (36.7 C), temperature source Oral, resp. rate 16, height 5' 6.5" (1.689 m), weight 160 lb 2 oz (72.6 kg), SpO2 96 %.  Exam chaperoned by Starla Link, CMA.  Gen: Alert, well appearing.  Patient is oriented to person, place, time, and situation. AFFECT: pleasant, lucid thought and speech. CV: RRR, no m/r/g.   LUNGS: CTA bilat, nonlabored resps, good aeration in all lung fields. ABD: soft, NT, ND, BS normal.  No hepatospenomegaly or mass.  No bruits. EXT: no clubbing or cyanosis.  no edema.   Pertinent labs:  Lab Results  Component Value Date   TSH 0.56 12/04/2012   Lab Results  Component Value Date   WBC 8.5 08/07/2018   HGB 12.2 08/07/2018   HCT 38.7 08/07/2018   MCV 96.0 08/07/2018   PLT 282 08/07/2018   Lab Results  Component Value Date   CREATININE 0.68 08/07/2018   BUN 12 08/07/2018   NA 141 08/07/2018   K 3.7 08/07/2018   CL 108 08/07/2018   CO2 25 08/07/2018   Lab Results  Component Value Date   ALT 30 12/04/2012   AST 25 12/04/2012   ALKPHOS 56 12/04/2012    BILITOT 0.7 12/04/2012   Lab Results  Component Value Date   CHOL 221 (H) 12/04/2012   Lab Results  Component Value Date   HDL 61.10 12/04/2012   Lab Results  Component Value Date   LDLCALC 124 (H) 12/15/2010   Lab Results  Component Value Date   TRIG 72.0 12/04/2012   Lab Results  Component Value Date   CHOLHDL 4 12/04/2012    ASSESSMENT AND PLAN:   New pt:  Get shot records and any recent labs/office notes from prior PCP.  1) HLD: tolerating rosuvastatin, otherwise has statin intolerance. No lab today.  2) RA: doing well on methotrexate 10mg  q week.  Keep f/u with Dr. Amil Amen. Will ask for his most recent labs and office note.  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  She'll have me follow this.  No labs today. I reviewed her last office note in EMR from Dr. Elyse Hsu and he has recommendations for her to continue 112 mcg synthroid qd.  She is on 100 mcg dose at this time, so somewhere in between that visit and now, she had a dose change-->likely by Dr. Rosana Hoes, her previous PCP. No labs today.  An After Visit Summary was printed and given to the patient.  Return in about 6 months (around 02/02/2019) for routine chronic illness f/u.  Signed:  Crissie Sickles, MD           08/07/2018

## 2018-08-07 ENCOUNTER — Ambulatory Visit: Payer: Self-pay

## 2018-08-07 ENCOUNTER — Other Ambulatory Visit: Payer: Self-pay

## 2018-08-07 ENCOUNTER — Encounter (HOSPITAL_BASED_OUTPATIENT_CLINIC_OR_DEPARTMENT_OTHER): Payer: Self-pay | Admitting: Emergency Medicine

## 2018-08-07 ENCOUNTER — Encounter: Payer: Self-pay | Admitting: Family Medicine

## 2018-08-07 ENCOUNTER — Ambulatory Visit: Payer: Medicare Other | Admitting: Family Medicine

## 2018-08-07 ENCOUNTER — Inpatient Hospital Stay (HOSPITAL_BASED_OUTPATIENT_CLINIC_OR_DEPARTMENT_OTHER)
Admission: EM | Admit: 2018-08-07 | Discharge: 2018-08-09 | DRG: 282 | Disposition: A | Discharge status: Home / Self Care | Payer: Medicare Other | Attending: Cardiology | Admitting: Cardiology

## 2018-08-07 ENCOUNTER — Inpatient Hospital Stay (HOSPITAL_COMMUNITY): Payer: Medicare Other

## 2018-08-07 ENCOUNTER — Emergency Department (HOSPITAL_BASED_OUTPATIENT_CLINIC_OR_DEPARTMENT_OTHER): Payer: Medicare Other

## 2018-08-07 DIAGNOSIS — K219 Gastro-esophageal reflux disease without esophagitis: Secondary | ICD-10-CM | POA: Diagnosis present

## 2018-08-07 DIAGNOSIS — Z7982 Long term (current) use of aspirin: Secondary | ICD-10-CM

## 2018-08-07 DIAGNOSIS — R001 Bradycardia, unspecified: Secondary | ICD-10-CM | POA: Diagnosis not present

## 2018-08-07 DIAGNOSIS — Z8249 Family history of ischemic heart disease and other diseases of the circulatory system: Secondary | ICD-10-CM

## 2018-08-07 DIAGNOSIS — I2510 Atherosclerotic heart disease of native coronary artery without angina pectoris: Secondary | ICD-10-CM | POA: Diagnosis present

## 2018-08-07 DIAGNOSIS — Z79890 Hormone replacement therapy (postmenopausal): Secondary | ICD-10-CM | POA: Diagnosis not present

## 2018-08-07 DIAGNOSIS — M069 Rheumatoid arthritis, unspecified: Secondary | ICD-10-CM | POA: Diagnosis present

## 2018-08-07 DIAGNOSIS — I493 Ventricular premature depolarization: Secondary | ICD-10-CM | POA: Diagnosis present

## 2018-08-07 DIAGNOSIS — E785 Hyperlipidemia, unspecified: Secondary | ICD-10-CM

## 2018-08-07 DIAGNOSIS — E890 Postprocedural hypothyroidism: Secondary | ICD-10-CM | POA: Diagnosis present

## 2018-08-07 DIAGNOSIS — I214 Non-ST elevation (NSTEMI) myocardial infarction: Principal | ICD-10-CM | POA: Diagnosis present

## 2018-08-07 DIAGNOSIS — Z823 Family history of stroke: Secondary | ICD-10-CM | POA: Diagnosis not present

## 2018-08-07 DIAGNOSIS — I340 Nonrheumatic mitral (valve) insufficiency: Secondary | ICD-10-CM

## 2018-08-07 DIAGNOSIS — Z87891 Personal history of nicotine dependence: Secondary | ICD-10-CM | POA: Diagnosis not present

## 2018-08-07 DIAGNOSIS — R079 Chest pain, unspecified: Secondary | ICD-10-CM | POA: Diagnosis not present

## 2018-08-07 HISTORY — PX: TRANSTHORACIC ECHOCARDIOGRAM: SHX275

## 2018-08-07 HISTORY — DX: Non-ST elevation (NSTEMI) myocardial infarction: I21.4

## 2018-08-07 LAB — BASIC METABOLIC PANEL
Anion gap: 8 (ref 5–15)
BUN: 12 mg/dL (ref 8–23)
CO2: 25 mmol/L (ref 22–32)
Calcium: 8.7 mg/dL — ABNORMAL LOW (ref 8.9–10.3)
Chloride: 108 mmol/L (ref 98–111)
Creatinine, Ser: 0.68 mg/dL (ref 0.44–1.00)
GFR calc Af Amer: >60 mL/min (ref >60)
GLUCOSE: 98 mg/dL (ref 70–99)
POTASSIUM: 3.7 mmol/L (ref 3.5–5.1)
Sodium: 141 mmol/L (ref 135–145)

## 2018-08-07 LAB — URINALYSIS, MICROSCOPIC (REFLEX)

## 2018-08-07 LAB — MRSA PCR SCREENING: MRSA BY PCR: NEGATIVE

## 2018-08-07 LAB — ECHOCARDIOGRAM COMPLETE
HEIGHTINCHES: 67 in
WEIGHTICAEL: 2547.2 [oz_av]

## 2018-08-07 LAB — URINALYSIS, ROUTINE W REFLEX MICROSCOPIC
Bilirubin Urine: NEGATIVE
Glucose, UA: NEGATIVE mg/dL
Ketones, ur: NEGATIVE mg/dL
LEUKOCYTES UA: NEGATIVE
Nitrite: NEGATIVE
PH: 5.5 (ref 5.0–8.0)
Protein, ur: NEGATIVE mg/dL
SPECIFIC GRAVITY, URINE: 1.01 (ref 1.005–1.030)

## 2018-08-07 LAB — CBC
HEMATOCRIT: 38.7 % (ref 36.0–46.0)
HEMOGLOBIN: 12.2 g/dL (ref 12.0–15.0)
MCH: 30.3 pg (ref 26.0–34.0)
MCHC: 31.5 g/dL (ref 30.0–36.0)
MCV: 96 fL (ref 80.0–100.0)
Platelets: 282 10*3/uL (ref 150–400)
RBC: 4.03 MIL/uL (ref 3.87–5.11)
RDW: 13.9 % (ref 11.5–15.5)
WBC: 8.5 10*3/uL (ref 4.0–10.5)
nRBC: 0 % (ref 0.0–0.2)

## 2018-08-07 LAB — HEMOGLOBIN A1C
Hgb A1c MFr Bld: 5.7 % — ABNORMAL HIGH (ref 4.8–5.6)
Mean Plasma Glucose: 116.89 mg/dL

## 2018-08-07 LAB — MAGNESIUM: MAGNESIUM: 2 mg/dL (ref 1.7–2.4)

## 2018-08-07 LAB — TROPONIN I
TROPONIN I: 15.61 ng/mL — AB (ref <0.03)
Troponin I: 12.12 ng/mL (ref <0.03)

## 2018-08-07 LAB — D-DIMER, QUANTITATIVE: D-Dimer, Quant: 0.62 ug/mL-FEU — ABNORMAL HIGH (ref 0.00–0.50)

## 2018-08-07 LAB — HEPARIN LEVEL (UNFRACTIONATED): HEPARIN UNFRACTIONATED: 0.6 [IU]/mL (ref 0.30–0.70)

## 2018-08-07 MED ORDER — ASPIRIN 81 MG PO CHEW
81.0000 mg | CHEWABLE_TABLET | ORAL | Status: AC
  Administered 2018-08-08: 81 mg via ORAL
  Filled 2018-08-07: qty 1

## 2018-08-07 MED ORDER — ALPRAZOLAM 0.25 MG PO TABS
0.2500 mg | ORAL_TABLET | Freq: Two times a day (BID) | ORAL | Status: DC | PRN
  Administered 2018-08-08 (×3): 0.25 mg via ORAL
  Filled 2018-08-07 (×3): qty 1

## 2018-08-07 MED ORDER — ROSUVASTATIN CALCIUM 5 MG PO TABS
10.0000 mg | ORAL_TABLET | Freq: Every day | ORAL | Status: DC
  Administered 2018-08-07: 10 mg via ORAL
  Filled 2018-08-07: qty 2

## 2018-08-07 MED ORDER — SODIUM CHLORIDE 0.9 % IV BOLUS
1000.0000 mL | Freq: Once | INTRAVENOUS | Status: AC
  Administered 2018-08-07: 1000 mL via INTRAVENOUS

## 2018-08-07 MED ORDER — VITAMIN D3 125 MCG (5000 UT) PO CAPS: 1.0000 | ORAL_CAPSULE | Freq: Every day | ORAL | Status: DC

## 2018-08-07 MED ORDER — SODIUM CHLORIDE 0.9 % IV SOLN: 250.0000 mL | INTRAVENOUS | Status: DC | PRN

## 2018-08-07 MED ORDER — ONDANSETRON HCL 4 MG/2ML IJ SOLN
4.0000 mg | Freq: Once | INTRAMUSCULAR | Status: AC
  Administered 2018-08-07: 4 mg via INTRAVENOUS
  Filled 2018-08-07: qty 2

## 2018-08-07 MED ORDER — SODIUM CHLORIDE 0.9% FLUSH
3.0000 mL | Freq: Two times a day (BID) | INTRAVENOUS | Status: DC
  Administered 2018-08-07: 3 mL via INTRAVENOUS

## 2018-08-07 MED ORDER — ONDANSETRON HCL 4 MG/2ML IJ SOLN: 4.0000 mg | Freq: Four times a day (QID) | INTRAMUSCULAR | Status: DC | PRN

## 2018-08-07 MED ORDER — LEVOTHYROXINE SODIUM 100 MCG PO TABS
100.0000 ug | ORAL_TABLET | Freq: Every day | ORAL | Status: DC
  Administered 2018-08-08 – 2018-08-09 (×2): 100 ug via ORAL
  Filled 2018-08-07 (×2): qty 1

## 2018-08-07 MED ORDER — SODIUM CHLORIDE 0.9% FLUSH: 3.0000 mL | INTRAVENOUS | Status: DC | PRN

## 2018-08-07 MED ORDER — OMEGA-3 FATTY ACIDS 1000 MG PO CAPS: 2.0000 g | ORAL_CAPSULE | Freq: Every day | ORAL | Status: DC

## 2018-08-07 MED ORDER — LUTEIN-ZEAXANTHIN 25-5 MG PO CAPS: 1.0000 | ORAL_CAPSULE | Freq: Every day | ORAL | Status: DC

## 2018-08-07 MED ORDER — SODIUM CHLORIDE 0.9 % WEIGHT BASED INFUSION
3.0000 mL/kg/h | INTRAVENOUS | Status: AC
  Administered 2018-08-08: 3 mL/kg/h via INTRAVENOUS

## 2018-08-07 MED ORDER — MORPHINE SULFATE (PF) 4 MG/ML IV SOLN
4.0000 mg | Freq: Once | INTRAVENOUS | Status: AC
  Administered 2018-08-07: 4 mg via INTRAVENOUS
  Filled 2018-08-07: qty 1

## 2018-08-07 MED ORDER — OMEGA-3-ACID ETHYL ESTERS 1 G PO CAPS
2.0000 g | ORAL_CAPSULE | Freq: Every day | ORAL | Status: DC
  Administered 2018-08-08: 2 g via ORAL
  Filled 2018-08-07: qty 2

## 2018-08-07 MED ORDER — CYANOCOBALAMIN 500 MCG PO TABS
500.0000 ug | ORAL_TABLET | Freq: Every day | ORAL | Status: DC
  Administered 2018-08-09: 500 ug via ORAL
  Filled 2018-08-07 (×3): qty 1

## 2018-08-07 MED ORDER — ACETAMINOPHEN 325 MG PO TABS
650.0000 mg | ORAL_TABLET | ORAL | Status: DC | PRN
  Administered 2018-08-07 – 2018-08-08 (×2): 650 mg via ORAL
  Filled 2018-08-07 (×2): qty 2

## 2018-08-07 MED ORDER — SODIUM CHLORIDE 0.9 % WEIGHT BASED INFUSION
1.0000 mL/kg/h | INTRAVENOUS | Status: DC
  Administered 2018-08-08: 1 mL/kg/h via INTRAVENOUS

## 2018-08-07 MED ORDER — HEPARIN (PORCINE) 25000 UT/250ML-% IV SOLN
800.0000 [IU]/h | INTRAVENOUS | Status: DC
  Administered 2018-08-07: 900 [IU]/h via INTRAVENOUS
  Filled 2018-08-07: qty 250

## 2018-08-07 MED ORDER — COQ-10 100 MG PO CAPS: 1.0000 | ORAL_CAPSULE | Freq: Every day | ORAL | Status: DC

## 2018-08-07 MED ORDER — ASPIRIN EC 81 MG PO TBEC
81.0000 mg | DELAYED_RELEASE_TABLET | Freq: Every day | ORAL | Status: DC
  Filled 2018-08-07: qty 1

## 2018-08-07 MED ORDER — FOLIC ACID 1 MG PO TABS
2.0000 mg | ORAL_TABLET | Freq: Every day | ORAL | Status: DC
  Administered 2018-08-08 – 2018-08-09 (×2): 2 mg via ORAL
  Filled 2018-08-07 (×2): qty 2

## 2018-08-07 MED ORDER — NITROGLYCERIN 0.4 MG SL SUBL: 0.4000 mg | SUBLINGUAL_TABLET | SUBLINGUAL | Status: DC | PRN

## 2018-08-07 MED ORDER — HEPARIN BOLUS VIA INFUSION
4000.0000 [IU] | Freq: Once | INTRAVENOUS | Status: AC
  Administered 2018-08-07: 4000 [IU] via INTRAVENOUS

## 2018-08-07 MED ORDER — NITROGLYCERIN 2 % TD OINT
1.0000 [in_us] | TOPICAL_OINTMENT | Freq: Four times a day (QID) | TRANSDERMAL | Status: DC
  Administered 2018-08-07 – 2018-08-08 (×4): 1 [in_us] via TOPICAL
  Filled 2018-08-07: qty 30

## 2018-08-07 MED ORDER — NITROGLYCERIN 2 % TD OINT
0.5000 [in_us] | TOPICAL_OINTMENT | Freq: Four times a day (QID) | TRANSDERMAL | Status: DC
  Administered 2018-08-07: 0.5 [in_us] via TOPICAL
  Filled 2018-08-07: qty 30
  Filled 2018-08-07: qty 1

## 2018-08-07 MED ORDER — SODIUM CHLORIDE 0.9% FLUSH
3.0000 mL | Freq: Two times a day (BID) | INTRAVENOUS | Status: DC
  Administered 2018-08-07 – 2018-08-08 (×3): 3 mL via INTRAVENOUS

## 2018-08-07 MED ORDER — ASPIRIN 81 MG PO CHEW
324.0000 mg | CHEWABLE_TABLET | Freq: Once | ORAL | Status: AC
  Administered 2018-08-07: 324 mg via ORAL
  Filled 2018-08-07: qty 4

## 2018-08-07 MED ORDER — VITAMIN D 25 MCG (1000 UNIT) PO TABS
5000.0000 [IU] | ORAL_TABLET | Freq: Every day | ORAL | Status: DC
  Administered 2018-08-08 – 2018-08-09 (×2): 5000 [IU] via ORAL
  Filled 2018-08-07 (×2): qty 5

## 2018-08-07 NOTE — ED Notes (Signed)
Attempted to call report-floor states they will call back.

## 2018-08-07 NOTE — ED Notes (Signed)
Called [facility] for cardiac hospitalist spoke with Marcello Moores

## 2018-08-07 NOTE — Progress Notes (Signed)
[facility] for heparin Indication: chest pain/ACS  Allergies  Allergen Reactions  . Neosporin [Neomycin-Bacitracin Zn-Polymyx] Other (See Comments)    Patient states that wounds or scratches never heal. (pt uses bacitracin)   . Seasonal Ic [Cholestatin] Other (See Comments)    Leg cramps  . Triamcinolone Other (See Comments)    Burning sensation  . Atorvastatin Other (See Comments)    Leg cramps  . Monascus Purpureus Went Yeast Other (See Comments)    "Red Yeast Rice" causes leg cramps  . Pravastatin Other (See Comments)    Leg cramps    Patient Measurements: Height: 5\' 7"  (170.2 cm) Weight: 159 lb 3.2 oz (72.2 kg) IBW/kg (Calculated) : 61.6 Heparin Dosing Weight: 72 kg  Vital Signs: Temp: 97.9 F (36.6 C) (11/25 2150) Temp Source: Oral (11/25 2150) BP: 116/63 (11/25 2150) Pulse Rate: 51 (11/25 2150)  Labs: Recent Labs    08/07/18 1106 08/07/18 1814 08/07/18 2304  HGB 12.2  --   --   HCT 38.7  --   --   PLT 282  --   --   HEPARINUNFRC  --   --  0.60  CREATININE 0.68  --   --   TROPONINI 15.61* 12.12*  --     Estimated Creatinine Clearance: 58.2 mL/min (by C-G formula based on SCr of 0.68 mg/dL).  Assessment: 77 y.o. female with chest pain for heparin   Goal of Therapy:  Heparin level 0.3-0.7 units/ml Monitor platelets by anticoagulation protocol: Yes   Plan:  Continue Heparin at current rate Follow-up am labs.  Phillis Knack, PharmD, BCPS

## 2018-08-07 NOTE — Progress Notes (Signed)
PHARMACIST - PHYSICIAN ORDER COMMUNICATION  CONCERNING: P&T Medication Policy on Herbal Medications  DESCRIPTION:  This patient's order for:  CoQ 10 and Lutein/zeaxanthin has been noted.  This product(s) is classified as an "herbal" or natural product. Due to a lack of definitive safety studies or FDA approval, nonstandard manufacturing practices, plus the potential risk of unknown drug-drug interactions while on inpatient medications, the Pharmacy and Therapeutics Committee does not permit the use of "herbal" or natural products of this type within [facility].   ACTION TAKEN: The pharmacy department is unable to verify this order at this time and your patient has been informed of this safety policy. Please reevaluate patient's clinical condition at discharge and address if the herbal or natural product(s) should be resumed at that time.  Sherlon Handing, PharmD, BCPS Clinical pharmacist  **Pharmacist phone directory can now be found on [email or web address] ([email or web address]).  Listed under [facility]. 08/07/2018 9:31 PM

## 2018-08-07 NOTE — Progress Notes (Signed)
  Echocardiogram 2D Echocardiogram has been performed.  Bonnie [name] 08/07/2018, 5:13 PM

## 2018-08-07 NOTE — ED Triage Notes (Signed)
Reports chest pain which began last night while watching TV.  States that when she woke up this morning with pain.  Reports the pain as constant.  Endorses dizziness and shortness of breath.

## 2018-08-07 NOTE — H&P (Addendum)
Cardiology Admission History and Physical:   Patient ID: Bonnie [name] MRN: [id number]; DOB: Feb 25, 1941   Admission date: 08/07/2018  Primary Care Provider: Tammi Sou, MD Primary Cardiologist: Minus Breeding, MD  Primary Electrophysiologist:  None   Chief Complaint:  Chest pain that began last pm.  Patient Profile:   Bonnie [name] is a 77 y.o. female with GERD, HLD, hypothyroidism, RA and thalassemia with no prior coronary disease.  Now presenting with chest pain to [place] where troponin was 15.61.       History of Present Illness:   Bonnie [name] with above hx and no prior cardiac hx. Her pain began last evening, Heavy pressure midsternal, mild nausea, no SOB, + dizziness, at times. She had fluttering palpitations and continues with these.   She has had this before- The dizziness.  Once in past she felt she would pass out.  Only FH is maybe in cousins.  She was able to go to bed but she had the pain when she woke this AM.  Martin Majestic to ER and does have flipped T wave in lead III new from 2017. EKG I personally reviewed.  SR      Her troponin is 15 - currently without pain. She has rec'd IV NTG paste, asa 324 mg, IV heparin and Morphine 4 mg IV, also zofran.  She is now at [facility] on progressive unit.  .  Na 141, k+ 3.7, Cr 0.69, Ca= 6.7  Hgb 12.2, Hct 38.7 WBC 8.5 and plts 282, DDimer is 0.62  CXR no active cardiopulmonary disease. Having Echo done now.  IV heparin infusing and HR to 45 at times. On no rate slowing medications.    Past Medical History:  Diagnosis Date  . Beta thalassemia, heterozygous    ?alpha? pt does not recall.  . Fatigue   . GERD (gastroesophageal reflux disease)   . Hemorrhoids   . History of adenomatous polyp of colon   . Hyperlipidemia   . Hypothyroidism, postsurgical   . Rheumatoid arthritis ([place]) 2007   muscle weaknes at times (Dr. Amil Amen)    Past Surgical History:  Procedure Laterality Date  . ABDOMINAL HYSTERECTOMY  1987   DUB, no malignancy.  Ovaries still in.  Marland Kitchen BLADDER REPAIR    . CHOLECYSTECTOMY  1969  . COLONOSCOPY  11/07/2017   2014 adenoma.  Rpt 10/2017 adenoma x 1.  No further colonoscopies needed per GI.   Marland Kitchen THYROIDECTOMY  2012   multinodular goiter  . UPPER GASTROINTESTINAL ENDOSCOPY    . VAGINAL PROLAPSE REPAIR  2012     Medications Prior to Admission: Prior to Admission medications   Medication Sig Start Date End Date Taking? Authorizing Provider  aspirin 81 MG tablet Take 81 mg by mouth daily.      [provider]  Calcium Citrate-Vitamin D (CALCIUM CITRATE + D PO) Take 2 tablets by mouth daily. Calcium 600mg /Vitamin D3 1600mg     [provider]  celecoxib (CELEBREX) 200 MG capsule Take 200 mg by mouth daily as needed.    [provider]  Cholecalciferol (VITAMIN D3) 125 MCG (5000 UT) CAPS Take 1 capsule by mouth daily.    [provider]  Coenzyme Q10 (COQ-10) 100 MG CAPS Take 1 capsule by mouth daily.    [provider]  Cyanocobalamin (B-12) 500 MCG TABS Take 1 tablet by mouth daily.     [provider]  diphenhydrAMINE (BENADRYL) 25 MG tablet Take 25 mg by mouth at  bedtime as needed.    [provider]  Estradiol (VAGIFEM) 10 MCG TABS Place vaginally. One tab per week     [provider]  fish oil-omega-3 fatty acids 1000 MG capsule Take 2 g by mouth daily.      [provider]  folic acid (FOLVITE) 1 MG tablet Take 2 mg by mouth daily.     [provider]  ibuprofen (ADVIL,MOTRIN) 400 MG tablet Take 400 mg by mouth every 4 (four) hours as needed.    [provider]  levothyroxine (SYNTHROID) 100 MCG tablet Take 1 tablet by mouth daily.    [provider]  Lutein-Zeaxanthin 25-5 MG CAPS Take 1 capsule by mouth daily.    [provider]  Magnesium 250 MG TABS Take 1 tablet by mouth daily.    [provider]  methotrexate 2.5 MG tablet Take 10 mg by mouth once a week.      [provider]  Multiple Vitamin (MULTIVITAMIN PO) Take by mouth daily.      [provider]  rosuvastatin (CRESTOR) 5 MG tablet Take 1 tablet by mouth daily.    [provider]     Allergies:    Allergies  Allergen Reactions  . Neosporin [Neomycin-Bacitracin Zn-Polymyx] Other (See Comments)    Patient states that wounds or scratches never heal. Patient states that wounds or scratches never heal.  . Seasonal Ic [Cholestatin]   . Triamcinolone Other (See Comments)    Burning sensation  . Atorvastatin Other (See Comments)  . Monascus Purpureus [employer] Other (See Comments)  . Pravastatin Other (See Comments)    Aches    Social History:   Social History   Socioeconomic History  . Marital status: Married    Spouse name: Not on file  . Number of children: 5  . Years of education: Not on file  . Highest education level: Not on file  Occupational History  . Not on file  Social Needs  . Financial resource strain: Not on file  . Food insecurity:    Worry: Not on file    Inability: Not on file  . Transportation needs:    Medical: Not on file    Non-medical: Not on file  Tobacco Use  . Smoking status: Former Smoker    Packs/day: 0.50    Years: 10.00    Pack years: 5.00    Types: Cigarettes    Last attempt to quit: 11/19/1973    Years since quitting: 44.7  . Smokeless tobacco: Never Used  Substance and Sexual Activity  . Alcohol use: Yes    Alcohol/week: 0.0 standard drinks    Comment: very rare glass of wine  . Drug use: No  . Sexual activity: Yes  Lifestyle  . Physical activity:    Days per week: Not on file    Minutes per session: Not on file  . Stress: Not on file  Relationships  . Social connections:    Talks on phone: Not on file    Gets together: Not on file    Attends religious service: Not on file    Active member of club or organization: Not on file    Attends meetings of clubs or organizations: Not on file    Relationship  status: Not on file  . Intimate partner violence:    Fear of current or ex partner: Not on file    Emotionally abused: Not on file    Physically abused: Not on file  Forced sexual activity: Not on file  Other Topics Concern  . Not on file  Social History Narrative   Lives with husband in [place].   Has 5 children.   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    Family History:  The patient's family history includes CVA in her mother; Hypertension in her mother. There is no history of Colon cancer, Esophageal cancer, Stomach cancer, or Rectal cancer.   Only FH of CAD may be cousins  ROS:  Please see the history of present illness.  General:no colds or fevers, no weight changes Skin:no rashes or ulcers HEENT:no blurred vision, no congestion CV:see HPI PUL:see HPI GI:no diarrhea constipation or melena, no indigestion GU:no hematuria, no dysuria MS:no joint pain, no claudication Neuro:no syncope, + lightheadedness last pm and in past Endo:no diabetes, + thyroid disease     Physical Exam/Data:   Vitals:   08/07/18 1330 08/07/18 1400 08/07/18 1430 08/07/18 1628  BP: (!) 154/64 132/79 (!) 137/59 (!) 117/55  Pulse: (!) 43 (!) 44 (!) 45   Resp: 16 12 17 13   Temp:    98.2 F (36.8 C)  TempSrc:    Oral  SpO2: 100% 99% 96% 98%  Weight:    72.2 kg  Height:    5\' 7"  (1.702 m)    Intake/Output Summary (Last 24 hours) at 08/07/2018 1720 Last data filed at 08/07/2018 1700 Gross per 24 hour  Intake 72.92 ml  Output -  Net 72.92 ml   Filed Weights   08/07/18 1049 08/07/18 1628  Weight: 72.6 kg 72.2 kg   Body mass index is 24.93 kg/m.  General:  Well nourished, well developed, in no acute distress HEENT: normal Lymph: no adenopathy Neck: no JVD Endocrine:  No thryomegaly Vascular: No carotid bruits; Pedal pulses 2+ bilaterally  Cardiac:  normal S1, S2; RRR; soft systolic murmur per Dr. Percival Spanish  Lungs:  clear to  auscultation bilaterally, no wheezing, rhonchi or rales  Abd: soft, nontender, no hepatomegaly  Ext: no lower ext edema Musculoskeletal:  No deformities, BUE and BLE strength normal and equal Skin: warm and dry  Neuro:  Alert and oriented X 3 MAE follows commands, no focal abnormalities noted Psych:  Normal affect      Relevant CV Studies: No prior tests except EKGs  Laboratory Data:  Chemistry Recent Labs  Lab 08/07/18 1106  NA 141  K 3.7  CL 108  CO2 25  GLUCOSE 98  BUN 12  CREATININE 0.68  CALCIUM 8.7*  GFRNONAA >60  GFRAA >60  ANIONGAP 8    No results for input(s): PROT, ALBUMIN, AST, ALT, ALKPHOS, BILITOT in the last 168 hours. Hematology Recent Labs  Lab 08/07/18 1106  WBC 8.5  RBC 4.03  HGB 12.2  HCT 38.7  MCV 96.0  MCH 30.3  MCHC 31.5  RDW 13.9  PLT 282   Cardiac Enzymes Recent Labs  Lab 08/07/18 1106  TROPONINI 15.61*   No results for input(s): TROPIPOC in the last 168 hours.  BNPNo results for input(s): BNP, PROBNP in the last 168 hours.  DDimer  Recent Labs  Lab 08/07/18 1106  DDIMER 0.62*    Radiology/Studies:  Dg Chest 2 View  Result Date: 08/07/2018 CLINICAL DATA:  Chest pain EXAM: CHEST - 2 VIEW COMPARISON:  12/22/2015 FINDINGS: Heart and mediastinal contours are within normal limits. No focal opacities or effusions. No acute bony abnormality. IMPRESSION: No active  cardiopulmonary disease. Electronically Signed   By: Rolm Baptise M.D.   On: 08/07/2018 11:31    Assessment and Plan:   1. NSTEMI now on IV heparin troponin 15, plan for cardiac cath tomorrow unless becomes unstable tonight, serial troponins.  No BB due to HR.  Dr. Percival Spanish to see, Echo pending. 2. Sinus brady with HR on OV 10/2017 at 49.   3. Hx hypothyroid will check TSH  4. HLD on crestor  Severity of Illness: The appropriate patient status for this patient is INPATIENT. Inpatient status is judged to be reasonable and necessary in order to provide the required  intensity of service to ensure the patient's safety. The patient's presenting symptoms, physical exam findings, and initial radiographic and laboratory data in the context of their chronic comorbidities is felt to place them at high risk for further clinical deterioration. Furthermore, it is not anticipated that the patient will be medically stable for discharge from the hospital within 2 midnights of admission. The following factors support the patient status of inpatient.   " The patient's presenting symptoms include chest pain. " The worrisome physical exam findings include bradycardia. " The initial radiographic and laboratory data are worrisome because of due to elevated troponin at 15, NSTEMI. " The chronic co-morbidities include HLD .   * I certify that at the point of admission it is my clinical judgment that the patient will require inpatient hospital care spanning beyond 2 midnights from the point of admission due to high intensity of service, high risk for further deterioration and high frequency of surveillance required.*    For questions or updates, please contact [place] Please consult [email or web address] for contact info under    Signed, Cecilie Kicks, NP  08/07/2018 5:20 PM   History and all data above reviewed.  Patient examined.  I agree with the findings as above.  The patient had chest pain last night.  This was unlike any pain she had previously had.  She said it was under both breasts and mid substernal.  She was nauseated and had some left arm pain.  She reports that it was moderate.  she did not come to the hospital however until this morning when the pain was persisting.  She was not noted to have an abnormal EKG but she did have elevated trop as above.  She has been treated with heparin and NTG paste and morphine.  She is currently pain free.  Otherwise she had been doing OK with some fatigue but no acute cardiovascular complaints.  The patient exam reveals COR:RRR, soft  apical systolic murmur  ,  Lungs: Clear  ,  Abd: Positive bowel sounds, no rebound no guarding, Ext No edema  .  All available labs, radiology testing, previous records reviewed. Agree with documented assessment and plan. NSTEMI:  Cath in the AM.  The patient understands that risks included but are not limited to stroke (1 in 1000), death (1 in 1000), kidney failure [usually temporary] (1 in 500), bleeding (1 in 200), allergic reaction [possibly serious] (1 in 200).  The patient understands and agrees to proceed.     MR:  I have reviewed the echo.  EF is NL.  There is moderate MR which I will likely follow clinically.  AI:  Appears to be mild.  Follow clinically. SB:  Hold off on any beta blocker.   Minus Breeding  5:32 PM  08/07/2018

## 2018-08-07 NOTE — Telephone Encounter (Signed)
Contacted patient.   Patient going to ER.

## 2018-08-07 NOTE — Telephone Encounter (Signed)
Pt. Reports that last night around 8:30 she started having chest pain - in the middle and hurt through to her back.Had nausea and dizziness as well. Also reports she "was burping a lot." "We had [place] food for dinner." Pain last  A couple of hours, "and then I was able to go to sleep." Denies any symptoms this morning. Appointment made for this morning.Instructed if symptoms return before appointment, to go to ED. Verbalizes understanding.   Reason for Disposition . [1] Chest pain lasts > 5 minutes AND [2] occurred > 3 days ago (72 hours) AND [3] NO chest pain or cardiac symptoms now  Answer Assessment - Initial Assessment Questions 1. LOCATION: "Where does it hurt?"       It was hurting in the middle 2. RADIATION: "Does the pain go anywhere else?" (e.g., into neck, jaw, arms, back)     Radiated into her back 3. ONSET: "When did the chest pain begin?" (Minutes, hours or days)      8:30 last night 4. PATTERN "Does the pain come and go, or has it been constant since it started?"  "Does it get worse with exertion?"      Comes and goes 5. DURATION: "How long does it last" (e.g., seconds, minutes, hours)     Lasted for hours 6. SEVERITY: "How bad is the pain?"  (e.g., Scale 1-10; mild, moderate, or severe)    - MILD (1-3): doesn't interfere with normal activities     - MODERATE (4-7): interferes with normal activities or awakens from sleep    - SEVERE (8-10): excruciating pain, unable to do any normal activities         5 7. CARDIAC RISK FACTORS: "Do you have any history of heart problems or risk factors for heart disease?" (e.g., prior heart attack, angina; high blood pressure, diabetes, being overweight, high cholesterol, smoking, or strong family history of heart disease)     No 8. PULMONARY RISK FACTORS: "Do you have any history of lung disease?"  (e.g., blood clots in lung, asthma, emphysema, birth control pills)     NBo 9. CAUSE: "What do you think is causing the chest pain?"      Unsure 10. OTHER SYMPTOMS: "Do you have any other symptoms?" (e.g., dizziness, nausea, vomiting, sweating, fever, difficulty breathing, cough)       Nausea and dizziness and burping 11. PREGNANCY: "Is there any chance you are pregnant?" "When was your last menstrual period?"       No  Protocols used: CHEST PAIN-A-AH

## 2018-08-07 NOTE — ED Provider Notes (Addendum)
[facility]Y DEPARTMENT Provider Note   CSN: [id number] Arrival date & time: 08/07/18  1042     History   Chief Complaint Chief Complaint  Patient presents with  . Chest Pain    HPI Bonnie [name] is a 77 y.o. female.  Pt presents to the ED today with cp.  She describes it as pressure.  Pt said it started last night around 2100.  It was associated with some dizziness and sob and back pain.  She checked her bp and it was elevated at home.  She went to sleep and it was still there this morning.  The pt is feeling better now.  No CP, but still has a little sob. She did take a regular asa last night.     Past Medical History:  Diagnosis Date  . Beta thalassemia, heterozygous    ?alpha? pt does not recall.  . Fatigue   . GERD (gastroesophageal reflux disease)   . Hemorrhoids   . History of adenomatous polyp of colon   . Hyperlipidemia   . Hypothyroidism, postsurgical   . Rheumatoid arthritis ([place]) 2007   muscle weaknes at times (Dr. Amil Amen)    Patient Active Problem List   Diagnosis Date Noted  . NSTEMI (non-ST elevated myocardial infarction) ([place]) 08/07/2018  . Porokeratosis 01/24/2015  . Hammer toe of left foot 01/24/2015  . Dermatitis 04/25/2013  . Abscess 05/18/2012  . Multinodular goiter (nontoxic) 04/27/2011  . LOC OSTEOARTHROS NOT SPEC WHETHER PRIM/SEC HAND 04/07/2009  . CERVICAL SPASM 02/27/2009  . FOOT PAIN, CHRONIC 11/07/2008  . UNS ADVRS EFF UNS RX MEDICINAL&BIOLOGICAL SBSTNC 06/27/2008  . ALLERGIC RHINITIS 12/26/2007  . HYPERTENSION 10/09/2007  . THALASSEMIA NEC 07/10/2007  . DIVERTICULOSIS, COLON 07/10/2007  . COLONIC POLYPS, HX OF 07/10/2007  . Other and unspecified hyperlipidemia 05/12/2007  . RHEUMATOID ARTHRITIS 05/12/2007  . OSTEOPOROSIS 05/12/2007  . OSTEOPENIA 05/12/2007  . PALPITATIONS 05/12/2007    Past Surgical History:  Procedure Laterality Date  . ABDOMINAL HYSTERECTOMY  1987   DUB, no malignancy.  Ovaries  still in.  Marland Kitchen BLADDER REPAIR    . CHOLECYSTECTOMY  1969  . COLONOSCOPY  11/07/2017   2014 adenoma.  Rpt 10/2017 adenoma x 1.  No further colonoscopies needed per GI.   Marland Kitchen THYROIDECTOMY  2012   multinodular goiter  . UPPER GASTROINTESTINAL ENDOSCOPY    . VAGINAL PROLAPSE REPAIR  2012     OB History   None      Home Medications    Prior to Admission medications   Medication Sig Start Date End Date Taking? Authorizing Provider  aspirin 81 MG tablet Take 81 mg by mouth daily.      [provider]  Calcium Citrate-Vitamin D (CALCIUM CITRATE + D PO) Take 2 tablets by mouth daily. Calcium 600mg /Vitamin D3 1600mg     [provider]  celecoxib (CELEBREX) 200 MG capsule Take 200 mg by mouth daily as needed.    [provider]  Cholecalciferol (VITAMIN D3) 125 MCG (5000 UT) CAPS Take 1 capsule by mouth daily.    [provider]  Coenzyme Q10 (COQ-10) 100 MG CAPS Take 1 capsule by mouth daily.    [provider]  Cyanocobalamin (B-12) 500 MCG TABS Take 1 tablet by mouth daily.     [provider]  diphenhydrAMINE (BENADRYL) 25 MG tablet Take 25 mg by mouth at bedtime as needed.    [provider]  Estradiol (VAGIFEM) 10 MCG TABS Place vaginally. One  tab per week     [provider]  fish oil-omega-3 fatty acids 1000 MG capsule Take 2 g by mouth daily.      [provider]  folic acid (FOLVITE) 1 MG tablet Take 2 mg by mouth daily.     [provider]  ibuprofen (ADVIL,MOTRIN) 400 MG tablet Take 400 mg by mouth every 4 (four) hours as needed.    [provider]  levothyroxine (SYNTHROID) 100 MCG tablet Take 1 tablet by mouth daily.    [provider]  Lutein-Zeaxanthin 25-5 MG CAPS Take 1 capsule by mouth daily.    [provider]  Magnesium 250 MG TABS Take 1 tablet by mouth daily.    [provider]  methotrexate 2.5 MG tablet Take 10 mg by mouth once a week.      [provider]  Multiple Vitamin (MULTIVITAMIN PO) Take by mouth daily.      [provider]  rosuvastatin (CRESTOR) 5 MG tablet Take 1 tablet by mouth daily.    [provider]    Family History Family History  Problem Relation Age of Onset  . Hypertension Mother   . Colon cancer Neg Hx   . Esophageal cancer Neg Hx   . Stomach cancer Neg Hx   . Rectal cancer Neg Hx     Social History Social History   Tobacco Use  . Smoking status: Former Smoker    Packs/day: 0.50    Years: 10.00    Pack years: 5.00    Types: Cigarettes    Last attempt to quit: 11/19/1973    Years since quitting: 44.7  . Smokeless tobacco: Never Used  Substance Use Topics  . Alcohol use: Yes    Alcohol/week: 0.0 standard drinks    Comment: very rare glass of wine  . Drug use: No     Allergies   Neosporin [neomycin-bacitracin zn-polymyx]; Seasonal ic [cholestatin]; Triamcinolone; Atorvastatin; Monascus purpureus went yeast; and Pravastatin   Review of Systems Review of Systems  Cardiovascular: Positive for chest pain.  All other systems reviewed and are negative.    Physical Exam Updated Vital Signs BP (!) 137/59   Pulse (!) 45   Temp 98.1 F (36.7 C) (Oral)   Resp 17   Ht 5' 6.5" (1.689 m)   Wt 72.6 kg   SpO2 96%   BMI 25.44 kg/m   Physical Exam  Constitutional: She is oriented to person, place, and time. She appears well-developed and well-nourished.  HENT:  Head: Normocephalic and atraumatic.  Eyes: Pupils are equal, round, and reactive to light. EOM are normal.  Neck: Normal range of motion. Neck supple.  Cardiovascular: Normal rate, regular rhythm, intact distal pulses and normal pulses.  Pulmonary/Chest: Effort normal and breath sounds normal.  Abdominal: Soft. Bowel sounds are normal.  Musculoskeletal: Normal range of motion.       Right lower leg: Normal.       Left lower leg: Normal.  Neurological: She is alert and oriented to person, place, and  time.  Skin: Skin is warm and dry. Capillary refill takes less than 2 seconds.  Psychiatric: She has a normal mood and affect. Her behavior is normal.  Nursing note and vitals reviewed.    ED Treatments / Results  Labs (all labs ordered are listed, but only abnormal results are displayed) Labs Reviewed  BASIC METABOLIC PANEL - Abnormal; Notable for the following components:      Result Value   Calcium 8.7 (*)  All other components within normal limits  TROPONIN I - Abnormal; Notable for the following components:   Troponin I 15.61 (*)    All other components within normal limits  URINALYSIS, ROUTINE W REFLEX MICROSCOPIC - Abnormal; Notable for the following components:   Hgb urine dipstick SMALL (*)    All other components within normal limits  D-DIMER, QUANTITATIVE (NOT AT [facility]) - Abnormal; Notable for the following components:   D-Dimer, Quant 0.62 (*)    All other components within normal limits  URINALYSIS, MICROSCOPIC (REFLEX) - Abnormal; Notable for the following components:   Bacteria, UA RARE (*)    All other components within normal limits  CBC  HEPARIN LEVEL (UNFRACTIONATED)    EKG EKG Interpretation  Date/Time:  Monday August 07 2018 10:52:41 EST Ventricular Rate:  52 PR Interval:    QRS Duration: 107 QT Interval:  434 QTC Calculation: 404 R Axis:   -3 Text Interpretation:  Sinus rhythm Ventricular premature complex Posterior infarct, old Borderline ST depression, lateral leads No significant change since last tracing Confirmed by Isla Pence [phone]) on 08/07/2018 11:14:56 AM   Radiology Dg Chest 2 View  Result Date: 08/07/2018 CLINICAL DATA:  Chest pain EXAM: CHEST - 2 VIEW COMPARISON:  12/22/2015 FINDINGS: Heart and mediastinal contours are within normal limits. No focal opacities or effusions. No acute bony abnormality. IMPRESSION: No active cardiopulmonary disease. Electronically Signed   By: Rolm Baptise M.D.   On: 08/07/2018 11:31     Procedures Procedures (including critical care time)  Medications Ordered in ED Medications  nitroGLYCERIN (NITROGLYN) 2 % ointment 0.5 inch (0.5 inches Topical Given 08/07/18 1313)  heparin ADULT infusion 100 units/mL (25000 units/268mL sodium chloride 0.45%) (900 Units/hr Intravenous Transfusing/Transfer 08/07/18 1459)  sodium chloride 0.9 % bolus 1,000 mL (1,000 mLs Intravenous New Bag/Given 08/07/18 1204)  aspirin chewable tablet 324 mg (324 mg Oral Given 08/07/18 1312)  heparin bolus via infusion 4,000 Units (4,000 Units Intravenous Bolus from Bag 08/07/18 1318)  morphine 4 MG/ML injection 4 mg (4 mg Intravenous Given 08/07/18 1459)  ondansetron (ZOFRAN) injection 4 mg (4 mg Intravenous Given 08/07/18 1459)     Initial Impression / Assessment and Plan / ED Course  I have reviewed the triage vital signs and the nursing notes.  Pertinent labs & imaging results that were available during my care of the patient were reviewed by me and considered in my medical decision making (see chart for details).    CRITICAL CARE Performed by: Isla Pence   Total critical care time: 30 minutes  Critical care time was exclusive of separately billable procedures and treating other patients.  Critical care was necessary to treat or prevent imminent or life-threatening deterioration.  Critical care was time spent personally by me on the following activities: development of treatment plan with patient and/or surrogate as well as nursing, discussions with consultants, evaluation of patient's response to treatment, examination of patient, obtaining history from patient or surrogate, ordering and performing treatments and interventions, ordering and review of laboratory studies, ordering and review of radiographic studies, pulse oximetry and re-evaluation of patient's condition.  Pt's cp is gone now.  The pt was given asa and heparin.  She is bradycardic (40s), but tolerating it well.  She does  not feel dizzy and bp is normal.    Pt d/w Dr. Percival Spanish (cardiology) who will admit pt to SDU and take her for a cath today.  Pt did admit to me that she had continued chest heaviness.  She would minimize it to me earlier.  She was given morphine and zofran IV which has helped.   Care link is her to get her and she remains stable for transfer to [facility].  Final Clinical Impressions(s) / ED Diagnoses   Final diagnoses:  NSTEMI (non-ST elevated myocardial infarction) ([place])  Sinus bradycardia  PVC (premature ventricular contraction)    ED Discharge Orders    None       Isla Pence, MD 08/07/18 1339    Isla Pence, MD 08/07/18 1513

## 2018-08-07 NOTE — Progress Notes (Signed)
ANTICOAGULATION CONSULT NOTE - Initial Consult  Pharmacy Consult for heparin Indication: chest pain/ACS  Allergies  Allergen Reactions  . Neosporin [Neomycin-Bacitracin Zn-Polymyx] Other (See Comments)    Patient states that wounds or scratches never heal. Patient states that wounds or scratches never heal.  . Seasonal Ic [Cholestatin]   . Triamcinolone Other (See Comments)    Burning sensation  . Atorvastatin Other (See Comments)  . Monascus Purpureus [employer] Other (See Comments)  . Pravastatin Other (See Comments)    Aches    Patient Measurements: Height: 5' 6.5" (168.9 cm) Weight: 160 lb (72.6 kg) IBW/kg (Calculated) : 60.45 Heparin Dosing Weight: 72 kg  Vital Signs: Temp: 98.1 F (36.7 C) (11/25 1053) Temp Source: Oral (11/25 1053) BP: 160/64 (11/25 1130) Pulse Rate: 46 (11/25 1130)  Labs: Recent Labs    08/07/18 1106  HGB 12.2  HCT 38.7  PLT 282  CREATININE 0.68  TROPONINI 15.61*    Estimated Creatinine Clearance: 61.7 mL/min (by C-G formula based on SCr of 0.68 mg/dL).   Medical History: Past Medical History:  Diagnosis Date  . Beta thalassemia, heterozygous    ?alpha? pt does not recall.  . Fatigue   . GERD (gastroesophageal reflux disease)   . Hemorrhoids   . History of adenomatous polyp of colon   . Hyperlipidemia   . Hypothyroidism, postsurgical   . Rheumatoid arthritis ([place]) 2007   muscle weaknes at times (Dr. Amil Amen)    Medications:   (Not in a hospital admission)  Assessment: 72 YOF who presented with CP. Pharmacy consulted to start IV heparin for ACS. Initial troponin is elevated at 15.61. H/H and Plt wnl   Goal of Therapy:  Heparin level 0.3-0.7 units/ml Monitor platelets by anticoagulation protocol: Yes   Plan:  -Heparin 4000 units IV bolus, then start IV heparin infusion at 900 units/hr -F/u 8 hr HL  -Monitor daily HL, CBC and s/s of bleeding   Albertina Parr, PharmD., BCPS Clinical Pharmacist Clinical phone for  08/07/18 until 3:30pm: [phone] If after 3:30pm, please refer to [facility] for unit-specific pharmacist

## 2018-08-07 NOTE — ED Notes (Signed)
ED Provider at bedside. 

## 2018-08-08 ENCOUNTER — Encounter (HOSPITAL_COMMUNITY)
Admission: EM | Disposition: A | Discharge status: Home / Self Care | Payer: Self-pay | Source: Home / Self Care | Attending: Cardiology

## 2018-08-08 ENCOUNTER — Encounter (HOSPITAL_COMMUNITY): Payer: Self-pay | Admitting: Cardiovascular Disease

## 2018-08-08 ENCOUNTER — Telehealth: Payer: Self-pay | Admitting: Cardiology

## 2018-08-08 DIAGNOSIS — I2510 Atherosclerotic heart disease of native coronary artery without angina pectoris: Secondary | ICD-10-CM

## 2018-08-08 DIAGNOSIS — I340 Nonrheumatic mitral (valve) insufficiency: Secondary | ICD-10-CM

## 2018-08-08 HISTORY — PX: LEFT HEART CATH AND CORONARY ANGIOGRAPHY: CATH118249

## 2018-08-08 LAB — CBC
HCT: 32.3 % — ABNORMAL LOW (ref 36.0–46.0)
Hemoglobin: 10.3 g/dL — ABNORMAL LOW (ref 12.0–15.0)
MCH: 30.6 pg (ref 26.0–34.0)
MCHC: 31.9 g/dL (ref 30.0–36.0)
MCV: 95.8 fL (ref 80.0–100.0)
PLATELETS: 208 10*3/uL (ref 150–400)
RBC: 3.37 MIL/uL — AB (ref 3.87–5.11)
RDW: 13.9 % (ref 11.5–15.5)
WBC: 9 10*3/uL (ref 4.0–10.5)
nRBC: 0 % (ref 0.0–0.2)

## 2018-08-08 LAB — BASIC METABOLIC PANEL
Anion gap: 7 (ref 5–15)
BUN: 9 mg/dL (ref 8–23)
CALCIUM: 8.3 mg/dL — AB (ref 8.9–10.3)
CO2: 22 mmol/L (ref 22–32)
CREATININE: 0.81 mg/dL (ref 0.44–1.00)
Chloride: 113 mmol/L — ABNORMAL HIGH (ref 98–111)
GFR calc Af Amer: >60 mL/min (ref >60)
Glucose, Bld: 99 mg/dL (ref 70–99)
Potassium: 3.6 mmol/L (ref 3.5–5.1)
SODIUM: 142 mmol/L (ref 135–145)

## 2018-08-08 LAB — TROPONIN I
TROPONIN I: 9.43 ng/mL — AB (ref <0.03)
TROPONIN I: 5.61 ng/mL — AB (ref <0.03)
TROPONIN I: 4.04 ng/mL — AB (ref <0.03)
Troponin I: 4.05 ng/mL (ref <0.03)

## 2018-08-08 LAB — HEPARIN LEVEL (UNFRACTIONATED): HEPARIN UNFRACTIONATED: 0.78 [IU]/mL — AB (ref 0.30–0.70)

## 2018-08-08 LAB — LIPID PANEL
Cholesterol: 112 mg/dL (ref 0–200)
HDL: 51 mg/dL (ref >40)
LDL CALC: 50 mg/dL (ref 0–99)
TRIGLYCERIDES: 54 mg/dL (ref <150)
Total CHOL/HDL Ratio: 2.2 RATIO
VLDL: 11 mg/dL (ref 0–40)

## 2018-08-08 LAB — T4, FREE: Free T4: 1.32 ng/dL (ref 0.82–1.77)

## 2018-08-08 LAB — PROTIME-INR
INR: 1.13
PROTHROMBIN TIME: 14.4 s (ref 11.4–15.2)

## 2018-08-08 LAB — TSH: TSH: 0.439 u[IU]/mL (ref 0.350–4.500)

## 2018-08-08 SURGERY — LEFT HEART CATH AND CORONARY ANGIOGRAPHY
Anesthesia: LOCAL

## 2018-08-08 MED ORDER — SODIUM CHLORIDE 0.9% FLUSH: 3.0000 mL | Freq: Two times a day (BID) | INTRAVENOUS | Status: DC

## 2018-08-08 MED ORDER — ASPIRIN 81 MG PO CHEW
81.0000 mg | CHEWABLE_TABLET | Freq: Every day | ORAL | Status: DC
  Administered 2018-08-09: 81 mg via ORAL
  Filled 2018-08-08: qty 1

## 2018-08-08 MED ORDER — MIDAZOLAM HCL 2 MG/2ML IJ SOLN
INTRAMUSCULAR | Status: AC
  Filled 2018-08-08: qty 2

## 2018-08-08 MED ORDER — HEPARIN (PORCINE) IN NACL 1000-0.9 UT/500ML-% IV SOLN
INTRAVENOUS | Status: DC | PRN
  Administered 2018-08-08 (×2): 500 mL

## 2018-08-08 MED ORDER — SODIUM CHLORIDE 0.9 % IV SOLN
INTRAVENOUS | Status: DC
  Administered 2018-08-08: 11:00:00 via INTRAVENOUS

## 2018-08-08 MED ORDER — SODIUM CHLORIDE 0.9% FLUSH: 3.0000 mL | INTRAVENOUS | Status: DC | PRN

## 2018-08-08 MED ORDER — NITROGLYCERIN 0.4 MG SL SUBL: 0.4000 mg | SUBLINGUAL_TABLET | SUBLINGUAL | 3 refills | Status: DC | PRN

## 2018-08-08 MED ORDER — LIDOCAINE HCL (PF) 1 % IJ SOLN
INTRAMUSCULAR | Status: AC
  Filled 2018-08-08: qty 30

## 2018-08-08 MED ORDER — HEPARIN SODIUM (PORCINE) 5000 UNIT/ML IJ SOLN
5000.0000 [IU] | Freq: Three times a day (TID) | INTRAMUSCULAR | Status: DC
  Administered 2018-08-08 – 2018-08-09 (×2): 5000 [IU] via SUBCUTANEOUS
  Filled 2018-08-08 (×2): qty 1

## 2018-08-08 MED ORDER — ONDANSETRON HCL 4 MG/2ML IJ SOLN: 4.0000 mg | Freq: Four times a day (QID) | INTRAMUSCULAR | Status: DC | PRN

## 2018-08-08 MED ORDER — NITROGLYCERIN 1 MG/10 ML FOR IR/CATH LAB
INTRA_ARTERIAL | Status: AC
  Filled 2018-08-08: qty 10

## 2018-08-08 MED ORDER — OMEGA-3-ACID ETHYL ESTERS 1 G PO CAPS: 2.0000 g | ORAL_CAPSULE | Freq: Every day | ORAL | 3 refills | Status: DC

## 2018-08-08 MED ORDER — ROSUVASTATIN CALCIUM 20 MG PO TABS
40.0000 mg | ORAL_TABLET | Freq: Every day | ORAL | Status: DC
  Administered 2018-08-08: 40 mg via ORAL
  Filled 2018-08-08: qty 2

## 2018-08-08 MED ORDER — ACETAMINOPHEN 325 MG PO TABS
650.0000 mg | ORAL_TABLET | ORAL | Status: DC | PRN
  Administered 2018-08-08 – 2018-08-09 (×3): 650 mg via ORAL
  Filled 2018-08-08 (×3): qty 2

## 2018-08-08 MED ORDER — NITROGLYCERIN IN D5W 200-5 MCG/ML-% IV SOLN
0.0000 ug/min | INTRAVENOUS | Status: DC
  Administered 2018-08-08: 5 ug/min via INTRAVENOUS
  Filled 2018-08-08: qty 250

## 2018-08-08 MED ORDER — FENTANYL CITRATE (PF) 100 MCG/2ML IJ SOLN
INTRAMUSCULAR | Status: DC | PRN
  Administered 2018-08-08: 25 ug via INTRAVENOUS

## 2018-08-08 MED ORDER — HEPARIN (PORCINE) IN NACL 1000-0.9 UT/500ML-% IV SOLN
INTRAVENOUS | Status: AC
  Filled 2018-08-08: qty 1000

## 2018-08-08 MED ORDER — MIDAZOLAM HCL 2 MG/2ML IJ SOLN
INTRAMUSCULAR | Status: DC | PRN
  Administered 2018-08-08: 1 mg via INTRAVENOUS

## 2018-08-08 MED ORDER — TICAGRELOR 90 MG PO TABS: 90.0000 mg | ORAL_TABLET | Freq: Two times a day (BID) | ORAL | 0 refills | Status: DC

## 2018-08-08 MED ORDER — TICAGRELOR 90 MG PO TABS
90.0000 mg | ORAL_TABLET | Freq: Two times a day (BID) | ORAL | Status: DC
  Administered 2018-08-08 – 2018-08-09 (×3): 90 mg via ORAL
  Filled 2018-08-08 (×3): qty 1

## 2018-08-08 MED ORDER — HEPARIN SODIUM (PORCINE) 1000 UNIT/ML IJ SOLN
INTRAMUSCULAR | Status: AC
  Filled 2018-08-08: qty 1

## 2018-08-08 MED ORDER — VERAPAMIL HCL 2.5 MG/ML IV SOLN
INTRAVENOUS | Status: DC | PRN
  Administered 2018-08-08: 10 mL via INTRA_ARTERIAL

## 2018-08-08 MED ORDER — SODIUM CHLORIDE 0.9 % IV SOLN: 250.0000 mL | INTRAVENOUS | Status: DC | PRN

## 2018-08-08 MED ORDER — TICAGRELOR 90 MG PO TABS: 90.0000 mg | ORAL_TABLET | Freq: Two times a day (BID) | ORAL | 3 refills | Status: DC

## 2018-08-08 MED ORDER — LIDOCAINE HCL (PF) 1 % IJ SOLN
INTRAMUSCULAR | Status: DC | PRN
  Administered 2018-08-08: 2 mL

## 2018-08-08 MED ORDER — IOHEXOL 350 MG/ML SOLN
INTRAVENOUS | Status: DC | PRN
  Administered 2018-08-08: 60 mL via INTRA_ARTERIAL

## 2018-08-08 MED ORDER — HEPARIN SODIUM (PORCINE) 1000 UNIT/ML IJ SOLN
INTRAMUSCULAR | Status: DC | PRN
  Administered 2018-08-08: 3500 [IU] via INTRAVENOUS

## 2018-08-08 MED ORDER — FENTANYL CITRATE (PF) 100 MCG/2ML IJ SOLN
INTRAMUSCULAR | Status: AC
  Filled 2018-08-08: qty 2

## 2018-08-08 MED ORDER — NITROGLYCERIN 1 MG/10 ML FOR IR/CATH LAB
INTRA_ARTERIAL | Status: DC | PRN
  Administered 2018-08-08: 200 ug via INTRACORONARY

## 2018-08-08 MED ORDER — VERAPAMIL HCL 2.5 MG/ML IV SOLN
INTRAVENOUS | Status: AC
  Filled 2018-08-08: qty 2

## 2018-08-08 MED ORDER — ROSUVASTATIN CALCIUM 40 MG PO TABS: 40.0000 mg | ORAL_TABLET | Freq: Every day | ORAL | 3 refills | Status: DC

## 2018-08-08 SURGICAL SUPPLY — 11 items
CATH INFINITI 5FR ANG PIGTAIL (CATHETERS) ×1 IMPLANT
CATH OPTITORQUE TIG 4.0 5F (CATHETERS) ×1 IMPLANT
DEVICE RAD COMP TR BAND LRG (VASCULAR PRODUCTS) ×1 IMPLANT
GLIDESHEATH SLEND SS 6F .021 (SHEATH) ×1 IMPLANT
GUIDEWIRE INQWIRE 1.5J.035X260 (WIRE) IMPLANT
INQWIRE 1.5J .035X260CM (WIRE) ×2
KIT HEART LEFT (KITS) ×2 IMPLANT
PACK CARDIAC CATHETERIZATION (CUSTOM PROCEDURE TRAY) ×2 IMPLANT
SYR MEDRAD MARK 7 150ML (SYRINGE) ×1 IMPLANT
TRANSDUCER W/STOPCOCK (MISCELLANEOUS) ×2 IMPLANT
TUBING CIL FLEX 10 FLL-RA (TUBING) ×2 IMPLANT

## 2018-08-08 NOTE — Telephone Encounter (Signed)
  TOC appt 08/15/18 @ 9:30 with Jory Sims per Roby Lofts

## 2018-08-08 NOTE — Interval H&P Note (Signed)
Cath Lab Visit (complete for each Cath Lab visit)  Clinical Evaluation Leading to the Procedure:   ACS: Yes.    Non-ACS:    Anginal Classification: CCS IV  Anti-ischemic medical therapy: No Therapy  Non-Invasive Test Results: No non-invasive testing performed  Prior CABG: No previous CABG      History and Physical Interval Note:  08/08/2018 7:33 AM  Bonnie [name]  has presented today for surgery, with the diagnosis of cp  The various methods of treatment have been discussed with the patient and family. After consideration of risks, benefits and other options for treatment, the patient has consented to  Procedure(s): LEFT HEART CATH AND CORONARY ANGIOGRAPHY (N/A) as a surgical intervention .  The patient's history has been reviewed, patient examined, no change in status, stable for surgery.  I have reviewed the patient's chart and labs.  Questions were answered to the patient's satisfaction.     Shelva Majestic

## 2018-08-08 NOTE — Progress Notes (Signed)
[id number] Did not walk with pt as she stated CP 8 on scale of 1-10. PA in to assess pt. MI education completed with pt and daughters who voiced understanding. Stressed importance of brilinta. Needs to see case manager. Reviewed NTG use, watching carbs with A1C of 5.7, heart healthy food choices, ex ed and CRP 2. Referred to [place] program. Will follow up tomorrow to walk if not discharged. Graylon Good RN BSN 08/08/2018 2:47 PM

## 2018-08-08 NOTE — Progress Notes (Signed)
[place] for heparin Indication: chest pain/ACS  Allergies  Allergen Reactions  . Neosporin [Neomycin-Bacitracin Zn-Polymyx] Other (See Comments)    Patient states that wounds or scratches never heal. (pt uses bacitracin)   . Seasonal Ic [Cholestatin] Other (See Comments)    Leg cramps  . Triamcinolone Other (See Comments)    Burning sensation  . Atorvastatin Other (See Comments)    Leg cramps  . Monascus Purpureus Went Yeast Other (See Comments)    "Red Yeast Rice" causes leg cramps  . Pravastatin Other (See Comments)    Leg cramps    Patient Measurements: Height: 5\' 7"  (170.2 cm) Weight: 160 lb 12.8 oz (72.9 kg) IBW/kg (Calculated) : 61.6 Heparin Dosing Weight: 72 kg  Vital Signs: Temp: 98 F (36.7 C) (11/26 0555) Temp Source: Oral (11/26 0555) BP: 111/56 (11/26 0819) Pulse Rate: 47 (11/26 0819)  Labs: Recent Labs    08/07/18 1106 08/07/18 1814 08/07/18 2304 08/08/18 0503  HGB 12.2  --   --  10.3*  HCT 38.7  --   --  32.3*  PLT 282  --   --  208  LABPROT  --   --  14.4  --   INR  --   --  1.13  --   HEPARINUNFRC  --   --  0.60 0.78*  CREATININE 0.68  --   --  0.81  TROPONINI 15.61* 12.12* 9.43* 5.61*    Estimated Creatinine Clearance: 57.5 mL/min (by C-G formula based on SCr of 0.81 mg/dL).  Assessment: 77 y.o. female with chest pain.  Pharmacy consulted to start IV heparin for ACS, initial troponin elevated at 15.61.    Heparin level supratherapeutic at 0.78 on 900 units/hr, H/H drop and plts wnl, no bleeding noted and is for cath today.    Goal of Therapy:  Heparin level 0.3-0.7 units/ml Monitor platelets by anticoagulation protocol: Yes   Plan:  Decrease heparin gtt to 800 units/hr F/u 6 hour heparin level Daily CBC, s/s bleeding  Bertis Ruddy, PharmD Clinical Pharmacist Please check AMION for all [place] numbers 08/08/2018 8:35 AM

## 2018-08-08 NOTE — Progress Notes (Signed)
   Notified by RN that patient complaining of chest pain. She underwent LHC this morning to evaluate chest pain and elevated troponin. 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 She was recommended for aspirin and brilinta x1 year given elevated troponins.  She reports chest pain similar to what prompted her to present to the ED. Some associated SOB, nausea, and dizziness. Her blood pressure is mildly elevated to 140s/70s. She has a benign cardio/pulmonary exam, some atypical chest wall TTP noted. Right radial cath site is C/D/I without significant ecchymosis or hematoma following removal of TR band. EKG without significant change from the morning - still with inferior TWI. Nitro paste was placed without significant improvement in symptoms. She was given prn xanax just prior to evaluation without significant change.   A/P: possible distal abrupt cut off in the mid posterior lateral vessel is causing symptoms.  - Without significant EKG changes, do not see a role in taking her back to the cath lab at this point.  - Will start a nitro gtt for pain control - could consider starting long acting nitro as pain started shortly after discontinuation of nitro paste - Will cycle troponins at this time - had been trending down from 15.61>5.61 this morning - Will continue to monitor patient overnight for improvement in symptoms.   Case discussed with Dr. Irish Lack.    Abigail Butts, PA-C 08/08/18

## 2018-08-08 NOTE — Care Management (Signed)
#    2.   S/W  DIANA  @ [facility] # [phone]  BRILINTA  90 MG BID COVER- YES CO-PAY- $ 25.00  30 DAY SUPPLY MUST USE A 90 DAY SUPPLY AT M/O OR RETAIL  $ 50.00 TIER- NO PRIOR APPROVAL- NO  NO DEDUCTIBLE  PREFERRED PHARMACY  : YES [facility] AND [facility]

## 2018-08-08 NOTE — Progress Notes (Signed)
Progress Note  Patient Name: Bonnie [name] Date of Encounter: 08/08/2018  Primary Cardiologist:   Minus Breeding, MD   Subjective   No pain.  No SOB  Inpatient Medications    Scheduled Meds: . aspirin EC  81 mg Oral Daily  . cholecalciferol  5,000 Units Oral Daily  . folic acid  2 mg Oral Daily  . levothyroxine  100 mcg Oral QAC breakfast  . nitroGLYCERIN  1 inch Topical Q6H  . omega-3 acid ethyl esters  2 g Oral Daily  . rosuvastatin  10 mg Oral q1800  . sodium chloride flush  3 mL Intravenous Q12H  . vitamin B-12  500 mcg Oral Daily   Continuous Infusions: . sodium chloride    . heparin 900 Units/hr (08/07/18 1321)   PRN Meds: sodium chloride, acetaminophen, ALPRAZolam, nitroGLYCERIN, ondansetron (ZOFRAN) IV, sodium chloride flush   Vital Signs    Vitals:   08/08/18 0805 08/08/18 0810 08/08/18 0814 08/08/18 0819  BP: (!) 103/54 (!) 105/53 (!) 106/56 (!) 111/56  Pulse: 80 (!) 48 (!) 49 (!) 47  Resp: 15 (!) 9 12 14   Temp:      TempSrc:      SpO2: 100% 98% 100% 99%  Weight:      Height:        Intake/Output Summary (Last 24 hours) at 08/08/2018 0839 Last data filed at 08/08/2018 0600 Gross per 24 hour  Intake 478.72 ml  Output -  Net 478.72 ml   Filed Weights   08/07/18 1049 08/07/18 1628 08/08/18 0555  Weight: 72.6 kg 72.2 kg 72.9 kg    Telemetry    NSR,PACs - Personally Reviewed  ECG    SB, T wave inversion in the inferior leads new since previous - Personally Reviewed  Physical Exam   GEN: No acute distress.   Neck: No  JVD Cardiac: RRR, soft systolic murmur, rubs, or gallops.  Respiratory: Clear  to auscultation bilaterally. GI: Soft, nontender, non-distended  MS: No  edema; No deformity.  Right radial with radial band in place Neuro:  Nonfocal  Psych: Normal affect   Labs    Chemistry Recent Labs  Lab 08/07/18 1106 08/08/18 0503  NA 141 142  K 3.7 3.6  CL 108 113*  CO2 25 22  GLUCOSE 98 99  BUN 12 9  CREATININE 0.68  0.81  CALCIUM 8.7* 8.3*  GFRNONAA >60 >60  GFRAA >60 >60  ANIONGAP 8 7     Hematology Recent Labs  Lab 08/07/18 1106 08/08/18 0503  WBC 8.5 9.0  RBC 4.03 3.37*  HGB 12.2 10.3*  HCT 38.7 32.3*  MCV 96.0 95.8  MCH 30.3 30.6  MCHC 31.5 31.9  RDW 13.9 13.9  PLT 282 208    Cardiac Enzymes Recent Labs  Lab 08/07/18 1106 08/07/18 1814 08/07/18 2304 08/08/18 0503  TROPONINI 15.61* 12.12* 9.43* 5.61*   No results for input(s): TROPIPOC in the last 168 hours.   BNPNo results for input(s): BNP, PROBNP in the last 168 hours.   DDimer  Recent Labs  Lab 08/07/18 1106  DDIMER 0.62*     Radiology    Dg Chest 2 View  Result Date: 08/07/2018 CLINICAL DATA:  Chest pain EXAM: CHEST - 2 VIEW COMPARISON:  12/22/2015 FINDINGS: Heart and mediastinal contours are within normal limits. No focal opacities or effusions. No acute bony abnormality. IMPRESSION: No active cardiopulmonary disease. Electronically Signed   By: Rolm Baptise M.D.   On: 08/07/2018 11:31  Cardiac Studies   ECHO:    Study Conclusions  - Left ventricle: The cavity size was normal. Systolic function was   normal. The estimated ejection fraction was in the range of 60%   to 65%. Wall motion was normal; there were no regional wall   motion abnormalities. Features are consistent with a pseudonormal   left ventricular filling pattern, with concomitant abnormal   relaxation and increased filling pressure (grade 2 diastolic   dysfunction). - Aortic valve: Transvalvular velocity was within the normal range.   There was no stenosis. There was mild regurgitation. Valve area   (VTI): 2.82 cm^2. Valve area (Vmax): 2.6 cm^2. Valve area   (Vmean): 2.89 cm^2. - Mitral valve: Mildly thickened leaflets . There was moderate   regurgitation. - Left atrium: The atrium was moderately dilated. - Right ventricle: The cavity size was normal. Wall thickness was   normal. Systolic function was normal. RV systolic pressure  (S,   est): 32 mm Hg. - Right atrium: The atrium was normal in size. - Tricuspid valve: There was trivial regurgitation. - Inferior vena cava: The vessel was normal in size. The   respirophasic diameter changes were in the normal range (>= 50%),   consistent with normal central venous pressure. - Pericardium, extracardiac: There was no pericardial effusion.  Patient Profile     77 y.o. female without prior cardiac history who presented with chest pain and markedly elevated troponin.  Echo with results as above.  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  I have reviewed the images with Dr. Claiborne [place]. Report pending.   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     She will be on ASA and Brilinta.  She will have her dose of Crestor increased.    MR:  I will follow this clinically.  Moderate  AI:  Mild I will follow this clinically.   PALPITATIONS:  No significant arrhythmias.    DISPOSITION:  Home later today after ambulating with Cardiac Rehab     For questions or updates, please contact [place] Please consult [email or web address] for contact info under Cardiology/STEMI.   Signed, Minus Breeding, MD  08/08/2018, 8:39 AM

## 2018-08-08 NOTE — Discharge Summary (Signed)
Discharge Summary    Patient ID: Bonnie [name],  MRN: [id number], DOB/AGE: 1940-11-12 77 y.o.  Admit date: 08/07/2018 Discharge date: 08/09/2018  Primary Care Provider: Tammi Sou Primary Cardiologist: Minus Breeding, MD  Discharge Diagnoses    Principal Problem:   NSTEMI (non-ST elevated myocardial infarction) [facility]) Active Problems:   Hyperlipidemia   Mitral regurgitation   Coronary artery disease   Allergies Allergies  Allergen Reactions  . Neosporin [Neomycin-Bacitracin Zn-Polymyx] Other (See Comments)    Patient states that wounds or scratches never heal. (pt uses bacitracin)   . Seasonal Ic [Cholestatin] Other (See Comments)    Leg cramps  . Triamcinolone Other (See Comments)    Burning sensation  . Atorvastatin Other (See Comments)    Leg cramps  . Monascus Purpureus Went Yeast Other (See Comments)    "Red Yeast Rice" causes leg cramps  . Pravastatin Other (See Comments)    Leg cramps    Diagnostic Studies/Procedures    Left heart catheterization 08/08/18:  Post Atrio lesion is 100% stenosed.  Mid RCA lesion is 20% stenosed.  Prox LAD to Mid LAD lesion is 60% stenosed.  LV end diastolic pressure is normal.   Preserved global LV function with EF 55% and a focal area of mild mid posterior hypokinesis. LVEDP 14 mm Hg.  Two-vessel coronary artery disease with smooth 60 to 65% mid LAD stenosis which did not significantly improve following IC nitroglycerin administration, a normal large left circumflex coronary artery; and a dominant RCA with smooth 20% mid stenosis and a suggestion of abrupt cut off the in the mid posterior lateral vessel which was small caliber.  RECOMMENDATION: The patient's wall motion abnormality and inferior T wave inversion is consistent with the mid posterior lateral abrupt cut off occlusion.  Although the LAD lesion is 60 to 65%, it is not felt to be the "culprit " vessel as suggested by the mid posterior wall motion  abnormality. In this patient with ACS and positive troponin to 15.61, recommend dual antiplatelet therapy for minimum of 1 year.  Medical therapy for concomitant CAD.  Aggressive lipid-lowering therapy with target LDL less than 70.  Echocardiogram 08/07/18: Study Conclusions  - Left ventricle: The cavity size was normal. Systolic function was   normal. The estimated ejection fraction was in the range of 60%   to 65%. Wall motion was normal; there were no regional wall   motion abnormalities. Features are consistent with a pseudonormal   left ventricular filling pattern, with concomitant abnormal   relaxation and increased filling pressure (grade 2 diastolic   dysfunction). - Aortic valve: Transvalvular velocity was within the normal range.   There was no stenosis. There was mild regurgitation. Valve area   (VTI): 2.82 cm^2. Valve area (Vmax): 2.6 cm^2. Valve area   (Vmean): 2.89 cm^2. - Mitral valve: Mildly thickened leaflets . There was moderate   regurgitation. - Left atrium: The atrium was moderately dilated. - Right ventricle: The cavity size was normal. Wall thickness was   normal. Systolic function was normal. RV systolic pressure (S,   est): 32 mm Hg. - Right atrium: The atrium was normal in size. - Tricuspid valve: There was trivial regurgitation. - Inferior vena cava: The vessel was normal in size. The   respirophasic diameter changes were in the normal range (>= 50%),   consistent with normal central venous pressure. - Pericardium, extracardiac: There was no pericardial effusion.  _____________   History of Present Illness  Bonnie [name] has a PMH of GERD, HLD, hypothyroidism, RA and thalassemia with no prior coronary disease. She presented to [facility] with chest pain on 08/07/18. Her pain began on the evening of 08/06/18, Heavy pressure midsternal, mild nausea, no SOB, + dizziness, at times. She had fluttering palpitations and continues with these.   She has had this before-  The dizziness.  Once in past she felt she would pass out.  Only FH is maybe in cousins.  She was able to go to bed but she had the pain when she woke this AM.  Martin Majestic to ER and does have flipped T wave in lead III new from 2017.   Her troponin is 15 - currently without pain. She has rec'd IV NTG paste, asa 324 mg, IV heparin and Morphine 4 mg IV, also zofran.  She is now at [facility] on progressive unit.  .  Na 141, k+ 3.7, Cr 0.69, Ca= 6.7  Hgb 12.2, Hct 38.7 WBC 8.5 and plts 282, DDimer is 0.62  CXR no active cardiopulmonary disease. Having Echo done now.  IV heparin infusing and HR to 45 at times. On no rate slowing medications.    Hospital Course     Consultants: None   1. NSTEMI: patient presented to [facility] with chest pain. Found to have an elevated troponin which peaked at 15.61 and trended down. Echo showed EF 60-65%, G2DD, no wall motion abnormalities, mild AI, moderate MR, and a moderately dilated LA. She underwent LHC 08/08/18 which showed 2 vessel CAD with 60-65% mLAD stenosis which did not improve with IC nitro, 20% mRCA stenosis with an abrupt cut off in the mid posterior lateral vessel which was small caliber (suspected to be culprit lesion), and a normal LCx. She was recommended for aspirin and brilinta x1 year given trop elevation, and aggressive risk factor modifications. She had recurrent chest pain following LHC with repeat trops continuing to downtrend. She was managed with IV nitro and transitioned to imdur without recurrence of symptoms. Right radial catheterization site was without significant hematoma or ecchymosis on the day of discharge.  - Continue aspirin and brilinta - Continue crestor - dose increased this admission - Continue imdur  2. HLD: LDL 50 this admission; at goal of <70. Given NSTEMI, crestor increased to 40mg  daily - Continue statin  3. Valvular abnormalities: noted to have moderate MR and mild AI on echo this admission - Continue to monitor routinely  outpatient.   4. Palpitations: no significant arrhythmias noted during admission - Continue to monitor outpatient.  _____________  Discharge Vitals Blood pressure 121/61, pulse (!) 54, temperature 98.2 F (36.8 C), temperature source Oral, resp. rate 15, height 5\' 7"  (1.702 m), weight 72 kg, SpO2 98 %.  Filed Weights   08/07/18 1628 08/08/18 0555 08/09/18 0449  Weight: 72.2 kg 72.9 kg 72 kg    Labs & Radiologic Studies    CBC Recent Labs    08/08/18 0503 08/09/18 0405  WBC 9.0 7.4  HGB 10.3* 10.5*  HCT 32.3* 33.6*  MCV 95.8 95.5  PLT 208 614   Basic Metabolic Panel Recent Labs    08/07/18 1814 08/08/18 0503 08/09/18 0405  NA  --  142 142  K  --  3.6 3.4*  CL  --  113* 114*  CO2  --  22 22  GLUCOSE  --  99 101*  BUN  --  9 <5*  CREATININE  --  0.81 0.66  CALCIUM  --  8.3* 8.3*  MG 2.0  --   --    Liver Function Tests No results for input(s): AST, ALT, ALKPHOS, BILITOT, PROT, ALBUMIN in the last 72 hours. No results for input(s): LIPASE, AMYLASE in the last 72 hours. Cardiac Enzymes Recent Labs    08/08/18 1552 08/08/18 2157 08/09/18 0405  TROPONINI 4.05* 4.04* 3.60*   BNP Invalid input(s): POCBNP D-Dimer Recent Labs    08/07/18 1106  DDIMER 0.62*   Hemoglobin A1C Recent Labs    08/07/18 2304  HGBA1C 5.7*   Fasting Lipid Panel Recent Labs    08/08/18 0503  CHOL 112  HDL 51  LDLCALC 50  TRIG 54  CHOLHDL 2.2   Thyroid Function Tests Recent Labs    08/07/18 2304  TSH 0.439   _____________  Dg Chest 2 View  Result Date: 08/07/2018 CLINICAL DATA:  Chest pain EXAM: CHEST - 2 VIEW COMPARISON:  12/22/2015 FINDINGS: Heart and mediastinal contours are within normal limits. No focal opacities or effusions. No acute bony abnormality. IMPRESSION: No active cardiopulmonary disease. Electronically Signed   By: Rolm Baptise M.D.   On: 08/07/2018 11:31   Disposition   Patient was seen and examined by Dr. Percival Spanish who deemed patient as stable  for discharge. Follow-up has been arranged. Discharge medications as listed below.   Follow-up Plans & Appointments    Follow-up Information    Lendon Colonel, NP Follow up on 08/15/2018.   Specialties:  Nurse Practitioner, Radiology, Cardiology Why:  Please arrive 15 minutes early for your 9:30am post-hospital cardiology appointment Contact information: [address] [place] [address] [phone]          Discharge Instructions    Amb Referral to Cardiac Rehabilitation   Complete by:  As directed    Diagnosis:  NSTEMI   Diet - low sodium heart healthy   Complete by:  As directed    Increase activity slowly   Complete by:  As directed       Discharge Medications   Allergies as of 08/09/2018      Reactions   Neosporin [neomycin-bacitracin Zn-polymyx] Other (See Comments)   Patient states that wounds or scratches never heal. (pt uses bacitracin)   Seasonal Ic [cholestatin] Other (See Comments)   Leg cramps   Triamcinolone Other (See Comments)   Burning sensation   Atorvastatin Other (See Comments)   Leg cramps   Monascus Purpureus Went Yeast Other (See Comments)   "Red Yeast Rice" causes leg cramps   Pravastatin Other (See Comments)   Leg cramps      Medication List    STOP taking these medications   celecoxib 200 MG capsule Commonly known as:  CELEBREX     TAKE these medications   acetaminophen 500 MG tablet Commonly known as:  TYLENOL Take 500 mg by mouth every 6 (six) hours as needed for headache (pain).   ALLEGRA PO Take 1 tablet by mouth daily as needed (seasonal allergies).   aspirin EC 81 MG tablet Take 81 mg by mouth at bedtime. What changed:  Another medication with the same name was removed. Continue taking this medication, and follow the directions you see here.   B-12 500 MCG Tabs Take 500 mcg by mouth daily.   CALCIUM CITRATE + D PO Take 1 tablet by mouth 2 (two) times daily.   Cod Liver Oil Oil Take 10 mLs by  mouth daily.   diphenhydrAMINE 25 MG tablet Commonly known as:  BENADRYL Take 25 mg by mouth  at bedtime as needed (seasonal allergies).   folic acid 1 MG tablet Commonly known as:  FOLVITE Take 1 mg by mouth 2 (two) times daily.   isosorbide mononitrate 60 MG 24 hr tablet Commonly known as:  IMDUR Take 1 tablet (60 mg total) by mouth daily. Start taking on:  08/10/2018   Lutein-Zeaxanthin 25-5 MG Caps Take 1 capsule by mouth daily.   Magnesium 250 MG Tabs Take 250 mg by mouth daily.   methotrexate 2.5 MG tablet Take 12.5 mg by mouth every Sunday.   multivitamin with minerals Tabs tablet Take 1 tablet by mouth daily.   nitroGLYCERIN 0.4 MG SL tablet Commonly known as:  NITROSTAT Place 1 tablet (0.4 mg total) under the tongue every 5 (five) minutes x 3 doses as needed for chest pain.   omega-3 acid ethyl esters 1 g capsule Commonly known as:  LOVAZA Take 2 capsules (2 g total) by mouth daily.   QUNOL ULTRA COQ10 PO Take 10 mLs by mouth daily.   rosuvastatin 40 MG tablet Commonly known as:  CRESTOR Take 1 tablet (40 mg total) by mouth daily at 6 PM. What changed:    medication strength  how much to take  when to take this   sodium chloride 0.65 % Soln nasal spray Commonly known as:  OCEAN Place 1 spray into both nostrils as needed for congestion.   SYNTHROID 100 MCG tablet Generic drug:  levothyroxine Take 100 mcg by mouth daily before breakfast.   ticagrelor 90 MG Tabs tablet Commonly known as:  BRILINTA Take 1 tablet (90 mg total) by mouth 2 (two) times daily.   ticagrelor 90 MG Tabs tablet Commonly known as:  BRILINTA Take 1 tablet (90 mg total) by mouth 2 (two) times daily.   VAGIFEM 10 MCG Tabs vaginal tablet Generic drug:  Estradiol Place 10 mcg vaginally every Sunday. One tab per week   Vitamin D3 125 MCG (5000 UT) Caps Take 5,000 Units by mouth daily.        Aspirin prescribed at discharge?  Yes High Intensity Statin Prescribed?  (Lipitor 40-80mg  or Crestor 20-40mg ): Yes Beta Blocker Prescribed? No: bradycardic at baseline For EF <40%, was ACEI/ARB Prescribed? No: Blood pressure soft ADP Receptor Inhibitor Prescribed? (i.e. Plavix etc.-Includes Medically Managed Patients): Yes For EF <40%, Aldosterone Inhibitor Prescribed? No: EF >40% Was EF assessed during THIS hospitalization? Yes Was Cardiac Rehab II ordered? (Included Medically managed Patients): Yes   Outstanding Labs/Studies   None  Duration of Discharge Encounter   Greater than 30 minutes including physician time.  Signed, Abigail Butts PA-C 08/09/2018, 2:12 PM

## 2018-08-08 NOTE — Telephone Encounter (Signed)
Current admission 

## 2018-08-09 ENCOUNTER — Encounter (HOSPITAL_COMMUNITY): Payer: Self-pay | Admitting: General Practice

## 2018-08-09 DIAGNOSIS — I340 Nonrheumatic mitral (valve) insufficiency: Secondary | ICD-10-CM

## 2018-08-09 DIAGNOSIS — I2510 Atherosclerotic heart disease of native coronary artery without angina pectoris: Secondary | ICD-10-CM

## 2018-08-09 LAB — BASIC METABOLIC PANEL
ANION GAP: 6 (ref 5–15)
CHLORIDE: 114 mmol/L — AB (ref 98–111)
CO2: 22 mmol/L (ref 22–32)
Calcium: 8.3 mg/dL — ABNORMAL LOW (ref 8.9–10.3)
Creatinine, Ser: 0.66 mg/dL (ref 0.44–1.00)
GFR calc Af Amer: >60 mL/min (ref >60)
GFR calc non Af Amer: >60 mL/min (ref >60)
Glucose, Bld: 101 mg/dL — ABNORMAL HIGH (ref 70–99)
POTASSIUM: 3.4 mmol/L — AB (ref 3.5–5.1)
Sodium: 142 mmol/L (ref 135–145)

## 2018-08-09 LAB — CBC
HCT: 33.6 % — ABNORMAL LOW (ref 36.0–46.0)
HEMOGLOBIN: 10.5 g/dL — AB (ref 12.0–15.0)
MCH: 29.8 pg (ref 26.0–34.0)
MCHC: 31.3 g/dL (ref 30.0–36.0)
MCV: 95.5 fL (ref 80.0–100.0)
NRBC: 0 % (ref 0.0–0.2)
Platelets: 220 10*3/uL (ref 150–400)
RBC: 3.52 MIL/uL — AB (ref 3.87–5.11)
RDW: 14 % (ref 11.5–15.5)
WBC: 7.4 10*3/uL (ref 4.0–10.5)

## 2018-08-09 LAB — TROPONIN I: Troponin I: 3.6 ng/mL (ref <0.03)

## 2018-08-09 MED ORDER — ISOSORBIDE MONONITRATE ER 60 MG PO TB24
60.0000 mg | ORAL_TABLET | Freq: Every day | ORAL | Status: DC
  Administered 2018-08-09: 60 mg via ORAL
  Filled 2018-08-09: qty 1

## 2018-08-09 MED ORDER — ISOSORBIDE MONONITRATE ER 60 MG PO TB24: 60.0000 mg | ORAL_TABLET | Freq: Every day | ORAL | 3 refills | Status: DC

## 2018-08-09 NOTE — Discharge Instructions (Signed)
PLEASE REMEMBER TO BRING ALL OF YOUR MEDICATIONS TO EACH OF YOUR FOLLOW-UP OFFICE VISITS.  PLEASE ATTEND ALL SCHEDULED FOLLOW-UP APPOINTMENTS.   Activity: Increase activity slowly as tolerated. You may shower, but no soaking baths (or swimming) for 1 week. No driving for 24 hours. No lifting over 5 lbs for 1 week. No sexual activity for 1 week.   You May Return to Work: in 1 week (if applicable)  Wound Care: You may wash cath site gently with soap and water. Keep cath site clean and dry. If you notice pain, swelling, bleeding or pus at your cath site, please call [phone].   Stop taking Celebrex as this medication can cause increased risk of bleeding when taken with aspirin and brilinta. Please discuss alternative medications with your primary care provider   Have a happy Thanksgiving!

## 2018-08-09 NOTE — Progress Notes (Signed)
Progress Note  Patient Name: Bonnie [name] Date of Encounter: 08/09/2018  Primary Cardiologist:   Minus Breeding, MD   Subjective   Chest pain yesterday.  Now pain free.  Difficult to assess the type of pain.  She reports that it was not severe but yesterday her family says that it was.   Inpatient Medications    Scheduled Meds: . aspirin  81 mg Oral Daily  . cholecalciferol  5,000 Units Oral Daily  . folic acid  2 mg Oral Daily  . heparin  5,000 Units Subcutaneous Q8H  . levothyroxine  100 mcg Oral QAC breakfast  . omega-3 acid ethyl esters  2 g Oral Daily  . rosuvastatin  40 mg Oral q1800  . sodium chloride flush  3 mL Intravenous Q12H  . sodium chloride flush  3 mL Intravenous Q12H  . ticagrelor  90 mg Oral BID  . vitamin B-12  500 mcg Oral Daily   Continuous Infusions: . sodium chloride    . sodium chloride 50 mL/hr at 08/08/18 2012  . sodium chloride    . nitroGLYCERIN 10 mcg/min (08/08/18 1603)   PRN Meds: sodium chloride, sodium chloride, acetaminophen, ALPRAZolam, nitroGLYCERIN, ondansetron (ZOFRAN) IV, sodium chloride flush, sodium chloride flush   Vital Signs    Vitals:   08/08/18 0819 08/08/18 1448 08/08/18 2206 08/09/18 0449  BP: (!) 111/56  124/62 (!) 130/59  Pulse: (!) 47 60 (!) 52 (!) 48  Resp: 14  15   Temp:  98.4 F (36.9 C) 97.8 F (36.6 C) 98.1 F (36.7 C)  TempSrc:  Oral Oral Oral  SpO2: 99% 96% 97% 98%  Weight:    72 kg  Height:        Intake/Output Summary (Last 24 hours) at 08/09/2018 0840 Last data filed at 08/09/2018 0500 Gross per 24 hour  Intake 2113 ml  Output -  Net 2113 ml   Filed Weights   08/07/18 1628 08/08/18 0555 08/09/18 0449  Weight: 72.2 kg 72.9 kg 72 kg    Telemetry    NSR, - Personally Reviewed  ECG    Ectopic atrial rhythm.  No acute ST elevation. Evolution of T wave inversion.    Physical Exam   GEN: No  acute distress.   Neck: No  JVD Cardiac: RRR, no murmurs, rubs, or gallops.    Respiratory: Clear  to auscultation bilaterally. GI: Soft, nontender, non-distended, normal bowel sounds  MS:  No edema; No deformity. Neuro:   Nonfocal  Psych: Oriented and appropriate   Labs    Chemistry Recent Labs  Lab 08/07/18 1106 08/08/18 0503 08/09/18 0405  NA 141 142 142  K 3.7 3.6 3.4*  CL 108 113* 114*  CO2 25 22 22   GLUCOSE 98 99 101*  BUN 12 9 <5*  CREATININE 0.68 0.81 0.66  CALCIUM 8.7* 8.3* 8.3*  GFRNONAA >60 >60 >60  GFRAA >60 >60 >60  ANIONGAP 8 7 6      Hematology Recent Labs  Lab 08/07/18 1106 08/08/18 0503 08/09/18 0405  WBC 8.5 9.0 7.4  RBC 4.03 3.37* 3.52*  HGB 12.2 10.3* 10.5*  HCT 38.7 32.3* 33.6*  MCV 96.0 95.8 95.5  MCH 30.3 30.6 29.8  MCHC 31.5 31.9 31.3  RDW 13.9 13.9 14.0  PLT 282 208 220    Cardiac Enzymes Recent Labs  Lab 08/08/18 0503 08/08/18 1552 08/08/18 2157 08/09/18 0405  TROPONINI 5.61* 4.05* 4.04* 3.60*   No results for input(s): TROPIPOC in the last 168 hours.  BNPNo results for input(s): BNP, PROBNP in the last 168 hours.   DDimer  Recent Labs  Lab 08/07/18 1106  DDIMER 0.62*     Radiology    Dg Chest 2 View  Result Date: 08/07/2018 CLINICAL DATA:  Chest pain EXAM: CHEST - 2 VIEW COMPARISON:  12/22/2015 FINDINGS: Heart and mediastinal contours are within normal limits. No focal opacities or effusions. No acute bony abnormality. IMPRESSION: No active cardiopulmonary disease. Electronically Signed   By: Rolm Baptise M.D.   On: 08/07/2018 11:31    Cardiac Studies   ECHO:    Study Conclusions  - Left ventricle: The cavity size was normal. Systolic function was   normal. The estimated ejection fraction was in the range of 60%   to 65%. Wall motion was normal; there were no regional wall   motion abnormalities. Features are consistent with a pseudonormal   left ventricular filling pattern, with concomitant abnormal   relaxation and increased filling pressure (grade 2 diastolic   dysfunction). -  Aortic valve: Transvalvular velocity was within the normal range.   There was no stenosis. There was mild regurgitation. Valve area   (VTI): 2.82 cm^2. Valve area (Vmax): 2.6 cm^2. Valve area   (Vmean): 2.89 cm^2. - Mitral valve: Mildly thickened leaflets . There was moderate   regurgitation. - Left atrium: The atrium was moderately dilated. - Right ventricle: The cavity size was normal. Wall thickness was   normal. Systolic function was normal. RV systolic pressure (S,   est): 32 mm Hg. - Right atrium: The atrium was normal in size. - Tricuspid valve: There was trivial regurgitation. - Inferior vena cava: The vessel was normal in size. The   respirophasic diameter changes were in the normal range (>= 50%),   consistent with normal central venous pressure. - Pericardium, extracardiac: There was no pericardial effusion.  CATH:   Post Atrio lesion is 100% stenosed.  Mid RCA lesion is 20% stenosed.  Prox LAD to Mid LAD lesion is 60% stenosed.  LV end diastolic pressure is normal.  Patient Profile     77 y.o. female without prior cardiac history who presented with chest pain and markedly elevated troponin.  Echo with results as above.  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  I have reviewed the images with Dr. Claiborne Billings. Report pending.   Assessment & Plan    NSTEMI:   Chest pain yesterday but trop still trending down.  Pain free now.  Stop the IV NTG and ambulate.  Home with PO nitrates if she is pain free later today.   MR:  Moderate.  I will follow this clinically.    AI:  Mild.  I will follow this clinically.    PALPITATIONS:   No arrhythmias.   DISPOSITION:  Home possibly as above.    For questions or updates, please contact [place] Please consult [email or web address] for contact info under Cardiology/STEMI.   Signed, Minus Breeding, MD  08/09/2018, 8:40 AM

## 2018-08-09 NOTE — Consult Note (Signed)
[name] CM Primary Care Navigator  08/09/2018  Bonnie [name] 1941-05-21 [id number]   Met with patient and daughter Bonnie [name]) at the bedsideto identify possible discharge needs.  Patient reportshaving "chest pressure/ chest pain" that resulted to this admission.(NSTEMI- non-ST elevated myocardial infarction, coronary artery disease, mitral regurgitation, hyperlipidemia)  Patient endorsesDr. Shawnie [name] with [facility] as herprimary care provider.   [facility] [place] [facility].   Patientstatesthat she has been managing her own medications at homeusing "pillbox" system filled once a month.  Patientreports that she has beendriving prior to admission especially that husband is recovering from stroke. Her daughter (lives close by) will be able to provide transportation to doctors' appointments after discharge. Patient plans to call their insurance company ([employer]) if transportation is also covered on her plan in case needed in the future.  Patient lives with her husband at home but daughter (works from home) will be staying at patient's house during the day to assist with their care needs and serve as the primary caregiver for her parents.  Anticipated discharge plan ishomeper patient.  Patientand daughter hadvoiced understanding to call primary care provider's office for a post discharge follow-up appointment within1- 2 weeks orsooner if needs arise. Patient letter (with PCP's contact number) wasprovided as a reminder.  Discussed with patientand daughter regarding [facility] services available for health management andresourcesat homeand both seemed interested but indicated not having current needsfor now. Patientand daughter voicedunderstandingof needto seek referral from primary care provider to [facility] care management ifdeemed necessary and appropriatefor  anyservicesin the future.  Bonnie [name] care management information was provided for future needs thatpatientmay have.  However, patient hadverbally agreedand optedforEMMIcalls tofollow-up with her recovery at home.   Referral made for [facility] calls after discharge.   For additional questions please contact:  Edwena Felty A. Fahed Morten, BSN, RN-BC [name] PRIMARY CARE Navigator Cell: [phone]

## 2018-08-09 NOTE — Progress Notes (Signed)
Cardiology Office Note   Date:  08/15/2018   ID:  Bonnie [name], DOB [birth year], MRN [id number]  PCP:  Tammi Sou, MD  Cardiologist: [facility]    Chief Complaint  Patient presents with  . Follow-up     History of Present Illness: Bonnie [name] is a 77 y.o. female who presents for post hospital follow up after admission for chest pain on 08/06/2018 describing heavy pressure midsternal with associated nausea. EKG in ED was abnormal with inverted T-wave in Lead III.   She had cardiac cath on 08/08/2018 revealing 2 vessel disease with 60-65 % in the mLAD, and mRCA with an abrupt cut off in the mid posterior lateral vessel which was a small caliber vessel and suspected to be the culprit vessel with normal L Cx. She did not require intervention but was treated with DAPT with ASA and Brilinta, with aggressive lipid lowering recommended with increased dose of Crestor.She was noted to have moderate MR and mild AI on echo with normal LV function .    She comes today with multiple questions. Unfortunately, she was seen in the ER yesterday, 08/14/2018 for palpitations. She was found to have bradycardia with potassium of 3.3. She was given 40 mEq of potassium and magnesium. It was noted on prior discharge her potassium was 3.6. Troponin was trending downward from prior admission at 0.58 > 0.52.  She was to have an OP event monitor placed.   Past Medical History:  Diagnosis Date  . Beta thalassemia, heterozygous    ?alpha? pt does not recall.  . Fatigue   . GERD (gastroesophageal reflux disease)   . Hemorrhoids   . History of adenomatous polyp of colon   . Hyperlipidemia   . 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  Surgical path NEG for atypica or malignancy.  . NSTEMI (non-ST elevated myocardial infarction) ([place]) 07/2018  .  Rheumatoid arthritis ([place]) 2007   muscle weaknes at times (Dr. Amil Amen)    Past Surgical History:  Procedure Laterality Date  . ABDOMINAL HYSTERECTOMY  1987   DUB, no malignancy.  Ovaries still in.  Marland Kitchen BLADDER REPAIR    . CHOLECYSTECTOMY  1969  . COLONOSCOPY  11/07/2017   2014 adenoma.  Rpt 10/2017 adenoma x 1.  No further colonoscopies needed per GI.   Marland Kitchen LEFT HEART CATH AND CORONARY ANGIOGRAPHY N/A 08/08/2018   Procedure: LEFT HEART CATH AND CORONARY ANGIOGRAPHY;  Surgeon: Troy Sine, MD;  Location: [place] [facility];  Service: Cardiovascular;  Laterality: N/A;  . THYROIDECTOMY  2012   cytologic atypia on nodule bx; surgical path NEG for atypica or malignancy.  Marland Kitchen UPPER GASTROINTESTINAL ENDOSCOPY    . VAGINAL PROLAPSE REPAIR  2012     Current Outpatient Medications  Medication Sig Dispense Refill  . acetaminophen (TYLENOL) 500 MG tablet Take 500 mg by mouth every 6 (six) hours as needed for headache (pain).    Marland Kitchen aspirin EC 81 MG tablet Take 81 mg by mouth at bedtime.    . Calcium Citrate-Vitamin D (CALCIUM CITRATE + D PO) Take 1 tablet by mouth 2 (two) times daily.     . Cholecalciferol (VITAMIN D3) 125 MCG (5000 UT) CAPS Take 5,000 Units by mouth daily.     . Cod Liver Oil OIL Take 10 mLs by mouth daily.    . Coenzyme Q10-Vitamin E [place] Breath  ULTRA COQ10 PO) Take 10 mLs by mouth daily.    . Cyanocobalamin (B-12) 500 MCG TABS Take 500 mcg by mouth daily.     . diphenhydrAMINE (BENADRYL) 25 MG tablet Take 25 mg by mouth at bedtime as needed (seasonal allergies).     . Estradiol (VAGIFEM) 10 MCG TABS vaginal tablet Place 10 mcg vaginally every Sunday. One tab per week     . Fexofenadine HCl (ALLEGRA PO) Take 1 tablet by mouth daily as needed (seasonal allergies).    . folic acid (FOLVITE) 1 MG tablet Take 1 mg by mouth 2 (two) times daily.     . isosorbide mononitrate (IMDUR) 60 MG 24 hr tablet Take 1 tablet (60 mg total) by mouth daily. 30 tablet 3  . levothyroxine (SYNTHROID) 100  MCG tablet Take 100 mcg by mouth daily before breakfast.     . Lutein-Zeaxanthin 25-5 MG CAPS Take 1 capsule by mouth daily.    . Magnesium 250 MG TABS Take 250 mg by mouth daily.     . methotrexate 2.5 MG tablet Take 12.5 mg by mouth every Sunday.     . Multiple Vitamin (MULTIVITAMIN WITH MINERALS) TABS tablet Take 1 tablet by mouth daily.    . nitroGLYCERIN (NITROSTAT) 0.4 MG SL tablet Place 1 tablet (0.4 mg total) under the tongue every 5 (five) minutes x 3 doses as needed for chest pain. 25 tablet 3  . omega-3 acid ethyl esters (LOVAZA) 1 g capsule Take 2 capsules (2 g total) by mouth daily. 90 capsule 3  . rosuvastatin (CRESTOR) 40 MG tablet Take 1 tablet (40 mg total) by mouth daily at 6 PM. 90 tablet 3  . sodium chloride (OCEAN) 0.65 % SOLN nasal spray Place 1 spray into both nostrils as needed for congestion.    . ticagrelor (BRILINTA) 90 MG TABS tablet Take 1 tablet (90 mg total) by mouth 2 (two) times daily. 60 tablet 0   No current facility-administered medications for this visit.     Allergies:   Neosporin [neomycin-bacitracin zn-polymyx]; Seasonal ic [cholestatin]; Triamcinolone; Atorvastatin; Monascus purpureus went yeast; and Pravastatin    Social History:  The patient  reports that she quit smoking about 44 years ago. Her smoking use included cigarettes. She has a 5.00 pack-year smoking history. She has never used smokeless tobacco. She reports that she drinks alcohol. She reports that she does not use drugs.   Family History:  The patient's family history includes CVA in her mother; Hypertension in her mother.    ROS: All other systems are reviewed and negative. Unless otherwise mentioned in H&P    PHYSICAL EXAM: VS:  BP 114/65 (BP Location: Left Arm)   Pulse (!) 57   Ht 5\' 6"  (1.676 m)   Wt 157 lb 12.8 oz (71.6 kg)   BMI 25.47 kg/m  , BMI Body mass index is 25.47 kg/m. GEN: Well nourished, well developed, in no acute distress HEENT: normal Neck: no JVD, carotid  bruits, or masses Cardiac: RRR; no murmurs, rubs, or gallops,no edema  Respiratory:  Clear to auscultation bilaterally, normal work of breathing GI: soft, nontender, nondistended, + BS MS: no deformity or atrophy Skin: warm and dry, no rash Neuro:  Strength and sensation are intact Psych: euthymic mood, full affect   EKG: Not completed this office visit.   Recent Labs: 08/07/2018: TSH 0.439 08/14/2018: BUN 11; Creatinine, Ser 0.96; Hemoglobin 12.0; Magnesium 2.2; Platelets 272; Potassium 3.3; Sodium 142    Lipid Panel  Component Value Date/Time   CHOL 112 08/08/2018 0503   TRIG 54 08/08/2018 0503   HDL 51 08/08/2018 0503   CHOLHDL 2.2 08/08/2018 0503   VLDL 11 08/08/2018 0503   LDLCALC 50 08/08/2018 0503   LDLDIRECT 155.5 12/04/2012 1123      Wt Readings from Last 3 Encounters:  08/15/18 157 lb 12.8 oz (71.6 kg)  08/14/18 168 lb (76.2 kg)  08/09/18 158 lb 12.8 oz (72 kg)      Other studies Reviewed:  Cardiac cath 08/08/2018   Post Atrio lesion is 100% stenosed.  Mid RCA lesion is 20% stenosed.  Prox LAD to Mid LAD lesion is 60% stenosed.  LV end diastolic pressure is normal.   Preserved global LV function with EF 55% and a focal area of mild mid posterior hypokinesis. LVEDP 14 mm Hg.  Two-vessel coronary artery disease with smooth 60 to 65% mid LAD stenosis which did not significantly improve following IC nitroglycerin administration, a normal large left circumflex coronary artery; and a dominant RCA with smooth 20% mid stenosis and a suggestion of abrupt cut off the in the mid posterior lateral vessel which was small caliber.  RECOMMENDATION: The patient's wall motion abnormality and inferior T wave inversion is consistent with the mid posterior lateral abrupt cut off occlusion.  Although the LAD lesion is 60 to 65%, it is not felt to be the "culprit " vessel as suggested by the mid posterior wall motion abnormality    ASSESSMENT AND PLAN:  1. CAD:  S/P NSTEMI on admission on 08/07/2018, cath revealed 2 vessel disease with 60-65% mLAD stenosis which improved with intracoronary nitroglycerin. She is now on po isosorbide, DAPT with Brilinta and ASA, and simvastatin. She has a headache from the nitrates. I have reassured her that this is a normal side effect. She can tylenol for this.  She is to attend cardiac rehab. Multiple questions are answered. A copy of the cath illustration is provided to her.   2. Hypokalemia: She is now started on magnesium supplement. I will also start her on slow K 16 mEq daily. I will repeat her BMET and Mg in two weeks. A 7 day cardiac monitor is ordered for duration and frequency of recurrent palpitations, HR and any other arrhythmia.   3. Hyperhcolesterolemia: Recently restarted on Simvastatin. Last LDL on 08/08/2018 was 50.   4. Thyroid Diease: She is followed by PCP for this   Current medicines are reviewed at length with the patient today.    Labs/ tests ordered today include: BMET and Mg.   Phill Myron. West Pugh, ANP, AACC   08/15/2018 9:34 AM    [facility] [place] Office [phone] Fax [phone]

## 2018-08-09 NOTE — Progress Notes (Signed)
CARDIAC REHAB PHASE I   PRE:  Rate/Rhythm: 52 SB  BP:  Supine:   Sitting: 121/64  Standing:    SaO2: 95%RA  MODE:  Ambulation: 670 ft   POST:  Rate/Rhythm: 77 SR   46-50 after sitting  BP:  Supine:   Sitting: 137/68  Standing:    SaO2: 96%RA 1010-1037 Pt able to walk 670 ft on RA with steady gait and no CP. Tolerated well. To chair after walk. Son in room. Pt off IV NTG.   Graylon Good, RN BSN  08/09/2018 10:33 AM

## 2018-08-09 NOTE — Care Management Note (Signed)
Case Management Note  Patient Details  Name: Bonnie [name] MRN: [id number] Date of Birth: 1941-07-23  Subjective/Objective:   Pt presented for NStemi- PTA Independent from home. Plan will be to return home with assistance from family. Brilinta co pay checked and patient is aware of cost.                  Action/Plan: 30 day free Brilinta Card provided to patient. No further needs from CM at this time.   Expected Discharge Date:  08/09/18               Expected Discharge Plan:  Home/Self Care  In-House Referral:  NA  Discharge planning Services  CM Consult  Post Acute Care Choice:  NA Choice offered to:  NA  DME Arranged:  N/A DME Agency:  NA  HH Arranged:  NA HH Agency:  NA  Status of Service:  Completed, signed off  If discussed at [place] of Stay Meetings, dates discussed:    Additional Comments:  Bethena Roys, RN 08/09/2018, 2:20 PM

## 2018-08-14 ENCOUNTER — Emergency Department (HOSPITAL_COMMUNITY): Payer: Medicare Other

## 2018-08-14 ENCOUNTER — Telehealth: Payer: Self-pay

## 2018-08-14 ENCOUNTER — Encounter (HOSPITAL_COMMUNITY): Payer: Self-pay | Admitting: Emergency Medicine

## 2018-08-14 ENCOUNTER — Other Ambulatory Visit: Payer: Self-pay | Admitting: Cardiology

## 2018-08-14 ENCOUNTER — Emergency Department (HOSPITAL_COMMUNITY)
Admission: EM | Admit: 2018-08-14 | Discharge: 2018-08-14 | Disposition: A | Discharge status: Home / Self Care | Payer: Medicare Other | Attending: Emergency Medicine | Admitting: Emergency Medicine

## 2018-08-14 ENCOUNTER — Other Ambulatory Visit: Payer: Self-pay

## 2018-08-14 DIAGNOSIS — Z87891 Personal history of nicotine dependence: Secondary | ICD-10-CM | POA: Insufficient documentation

## 2018-08-14 DIAGNOSIS — R002 Palpitations: Secondary | ICD-10-CM | POA: Diagnosis not present

## 2018-08-14 DIAGNOSIS — E039 Hypothyroidism, unspecified: Secondary | ICD-10-CM | POA: Diagnosis not present

## 2018-08-14 DIAGNOSIS — I252 Old myocardial infarction: Secondary | ICD-10-CM | POA: Diagnosis not present

## 2018-08-14 DIAGNOSIS — Z79899 Other long term (current) drug therapy: Secondary | ICD-10-CM | POA: Diagnosis not present

## 2018-08-14 DIAGNOSIS — R001 Bradycardia, unspecified: Secondary | ICD-10-CM

## 2018-08-14 DIAGNOSIS — M069 Rheumatoid arthritis, unspecified: Secondary | ICD-10-CM | POA: Insufficient documentation

## 2018-08-14 DIAGNOSIS — R079 Chest pain, unspecified: Secondary | ICD-10-CM | POA: Diagnosis not present

## 2018-08-14 LAB — CBC WITH DIFFERENTIAL/PLATELET
ABS IMMATURE GRANULOCYTES: 0.02 10*3/uL (ref 0.00–0.07)
BASOS PCT: 1 %
Basophils Absolute: 0.1 10*3/uL (ref 0.0–0.1)
Eosinophils Absolute: 0.2 10*3/uL (ref 0.0–0.5)
Eosinophils Relative: 3 %
HCT: 38 % (ref 36.0–46.0)
Hemoglobin: 12 g/dL (ref 12.0–15.0)
IMMATURE GRANULOCYTES: 0 %
LYMPHS PCT: 27 %
Lymphs Abs: 2 10*3/uL (ref 0.7–4.0)
MCH: 30.2 pg (ref 26.0–34.0)
MCHC: 31.6 g/dL (ref 30.0–36.0)
MCV: 95.5 fL (ref 80.0–100.0)
MONOS PCT: 10 %
Monocytes Absolute: 0.7 10*3/uL (ref 0.1–1.0)
NEUTROS ABS: 4.4 10*3/uL (ref 1.7–7.7)
NEUTROS PCT: 59 %
PLATELETS: 272 10*3/uL (ref 150–400)
RBC: 3.98 MIL/uL (ref 3.87–5.11)
RDW: 13.7 % (ref 11.5–15.5)
WBC: 7.5 10*3/uL (ref 4.0–10.5)
nRBC: 0 % (ref 0.0–0.2)

## 2018-08-14 LAB — I-STAT TROPONIN, ED
TROPONIN I, POC: 0.58 ng/mL — AB (ref 0.00–0.08)
Troponin i, poc: 0.52 ng/mL (ref 0.00–0.08)

## 2018-08-14 LAB — MAGNESIUM: Magnesium: 2.2 mg/dL (ref 1.7–2.4)

## 2018-08-14 LAB — BASIC METABOLIC PANEL
ANION GAP: 12 (ref 5–15)
BUN: 11 mg/dL (ref 8–23)
CO2: 22 mmol/L (ref 22–32)
Calcium: 9.1 mg/dL (ref 8.9–10.3)
Chloride: 108 mmol/L (ref 98–111)
Creatinine, Ser: 0.96 mg/dL (ref 0.44–1.00)
GFR calc Af Amer: >60 mL/min (ref >60)
GFR, EST NON AFRICAN AMERICAN: 57 mL/min — AB (ref >60)
Glucose, Bld: 99 mg/dL (ref 70–99)
Potassium: 3.3 mmol/L — ABNORMAL LOW (ref 3.5–5.1)
SODIUM: 142 mmol/L (ref 135–145)

## 2018-08-14 MED ORDER — POTASSIUM CHLORIDE CRYS ER 10 MEQ PO TBCR: 40.0000 meq | EXTENDED_RELEASE_TABLET | Freq: Once | ORAL | Status: DC

## 2018-08-14 MED ORDER — POTASSIUM CHLORIDE CRYS ER 20 MEQ PO TBCR
40.0000 meq | EXTENDED_RELEASE_TABLET | Freq: Once | ORAL | Status: AC
  Administered 2018-08-14: 40 meq via ORAL
  Filled 2018-08-14: qty 2

## 2018-08-14 NOTE — ED Provider Notes (Signed)
Signed out by Dr Leonides Schanz to d/c to home if 2nd trop not increasing (as represents slow decline from markedly elevated trop during prior visit).  Repeat trop sl lower than previous.   Pt has no chest pain or discomfort.  Heart rate is as low as 40 on monitor, sinus. Hx same. Is not on bblocker. Cardiology consulted.   Cardiology has seen and indicates to d/c to home, and that they have plan to f/u in office tomorrow, possible outpatient monitor then - see part of their note with plan below:  Assessment and Plan:   Bonnie [name] a PMH ofGERD, HLD, hypothyroidism, RA, thalassemia and recent admission for NSTEMI 08/07/18-08/09/18 (no PCI, medical management only), who returns to the ED w/ CC of palpitations and noted to be bradycardic.   1. Palpitations/ Bradycardia: pt notes episode of tachy palpitations that lasted several hours but resolved by the time she made it to the ED. EKG and tele shows sinus bradycardia. K 3.3. Will order Kdur 40 meq. Mg WNL. Renal function and H/H normal. She had a thyroidectomy years ago and has been treated with levothyroxine, however TSH and free T3/T4 were checked last week and WNL. Recent echo showed normal LVEF. She is feeling better currently. Symptoms resolved. HR in the mid 50s on tele. BP stable.  Ok to d/c from the ED and plan for outpatient monitor for further surveillance. We will place order for monitor. Our office will call to arrange pick-up. She has a clinic appt scheduled for tomorrow as well.   2. CAD: s/p recent NSTEMI 08/07/18 with cath showing 2 vessel CAD with 60-65% mLAD stenosis which did not improve with IC nitro, 20% mRCA stenosis with an abrupt cut off in the mid posterior lateral vessel which was small caliber (suspected to be culprit lesion), and a normal LCx. She was recommended for aspirin and brilinta x1 year given trop elevation, and aggressive risk factor modifications. Crestor and Imdur also added. EF normal by echo. She denies any  recurrent CP and troponins continue to trend downward. Troponin peaked at 15 last admission and was down to 3.6 day of discharge. Repeat troponins today at 0.58>>0.52. Continue medical therapy.    For questions or updates, please contact [place] Please consult [email or web address] for contact info under        Signed, Lyda Jester, PA-C  08/14/2018 11:36 AM    Agree with note written by Ellen Henri  [facility]  We were asked to see Bonnie [name] because of palpitations.  She is a cardiology patient of Dr. Rosezella [place].  She was recently admitted last week with non-STEMI and underwent cardiac catheterization revealing a distal PLA obstruction too small to intervene on and was treated medically.  Her troponins were low.  She was just discharged home last Wednesday.  She comes in with palpitations today which are more noticeable than her usual symptoms although on telemetry she has sinus rhythm and her EKG shows sinus bradycardia.  She also complains of some shortness of breath which may be related to the Brilinta.  Her exam is benign.  She is somewhat hypokalemic and we will replete.  She is also somewhat bradycardic as well on beta-blocker.  At this point, I do not feel compelled to admit her.  She has an appointment with Dr. Percival Spanish tomorrow in the office.  Will will arrange for her to have an outpatient event monitor.   [name] 08/14/2018 1:12 PM     D/c per cardiology instructions. F/u office  tomorrow.      Lajean Saver, MD 08/14/18 1343

## 2018-08-14 NOTE — Consult Note (Addendum)
Cardiology Consult:   Patient ID: Bonnie [name] MRN: [id number]; DOB: 12-10-1940   Admission date: 08/14/2018  Primary Care Provider: Tammi Sou, MD Primary Cardiologist: Minus Breeding, MD  Primary Electrophysiologist:  None   Chief Complaint:  Palpitations   Patient Profile:   Bonnie [name] has a PMH of GERD, HLD, hypothyroidism, RA, thalassemia and recent admission for NSTEMI 08/07/18-08/09/18 (no PCI, medical management only), who returns to the ED w/ CC of palpitations and noted to be bradycardic. Pt is being seen today for palpitations and bradycardia, at the request of Dr. Leonides Schanz, Emergency Medicine.   History of Present Illness:   Bonnie [name] has a PMH of GERD, HLD, hypothyroidism, RA, thalassemia and recent admission for NSTEMI 08/07/18-08/09/18 (no PCI, medical management only), who returns to the ED w/ CC of palpitations and noted to be bradycardic.   Per recent d/c summary, she presented to [facility] w/ CP on 11/25 and was found to have an elevated troponin, peaking at 15.61. She was transferred to [facility]. Echo showed EF 60-65%, G2DD, no wall motion abnormalities, mild AI, moderate MR, and a moderately dilated LA. She underwent LHC 08/08/18 which showed 2 vessel CAD with 60-65% mLAD stenosis which did not improve with IC nitro, 20% mRCA stenosis with an abrupt cut off in the mid posterior lateral vessel which was small caliber (suspected to be culprit lesion), and a normal LCx. No PCI was performed. She was recommended for aspirin and brilinta x1 year given trop elevation, and aggressive risk factor modifications. She had recurrent chest pain following LHC with repeat trops continuing to downtrend. She was managed with IV nitro and transitioned to imdur without recurrence of symptoms. Right radial catheterization site was without significant hematoma or ecchymosis on the day of discharge. He troponin day of discharge was down to 3.60. She was not discharged on a beta blocker due to baseline  bradycardia. She was treated w/ ASA, Brilinta, Crestor and Imdur.   She now presents back to the ED w/ palpitations. Symptoms started last night while sleeping. She has felt flutters before in the past but no sustained palpitations until this morning. Palpitations felt fast and irregular and would not go away. She also felt lightheaded but denies syncope/ near syncope. She denies CP. She had chest pressure with her NSTEMI last week. No recurrence since last hospitalization. She denies excess caffeine. No ETOH.   In the ED, POC troponin is now 0.58>>0.52. EKG shows sinus bradycardia at 53 bpm. Tele shows sinus bradycardia, with lowest HR at 39 bpm. CBC unremarkable. BMP notable for hypokalemia at 3.3. Mg WNL. She denies vomiting and diarrhea. She has felt nauseated and reports decreased appetite. No fever, chills or abdominal pain. She reports full med compliance.    Past Medical History:  Diagnosis Date  . Beta thalassemia, heterozygous    ?alpha? pt does not recall.  . Fatigue   . GERD (gastroesophageal reflux disease)   . Hemorrhoids   . History of adenomatous polyp of colon   . Hyperlipidemia   . 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  Surgical path NEG for atypica or malignancy.  . NSTEMI (non-ST elevated myocardial infarction) ([place]) 07/2018  . Rheumatoid arthritis ([place]) 2007   muscle weaknes at times (Dr. Amil Amen)    Past Surgical History:  Procedure Laterality Date  . ABDOMINAL HYSTERECTOMY  1987   DUB,  no malignancy.  Ovaries still in.  Marland Kitchen BLADDER REPAIR    . CHOLECYSTECTOMY  1969  . COLONOSCOPY  11/07/2017   2014 adenoma.  Rpt 10/2017 adenoma x 1.  No further colonoscopies needed per GI.   Marland Kitchen LEFT HEART CATH AND CORONARY ANGIOGRAPHY N/A 08/08/2018   Procedure: LEFT HEART CATH AND CORONARY ANGIOGRAPHY;  Surgeon:  Troy Sine, MD;  Location: [facility];  Service: Cardiovascular;  Laterality: N/A;  . THYROIDECTOMY  2012   cytologic atypia on nodule bx; surgical path NEG for atypica or malignancy.  Marland Kitchen UPPER GASTROINTESTINAL ENDOSCOPY    . VAGINAL PROLAPSE REPAIR  2012     Medications Prior to Admission: Prior to Admission medications   Medication Sig Start Date End Date Taking? Authorizing Provider  acetaminophen (TYLENOL) 500 MG tablet Take 500 mg by mouth every 6 (six) hours as needed for headache (pain).   Yes [provider]  aspirin EC 81 MG tablet Take 81 mg by mouth at bedtime.   Yes [provider]  Calcium Citrate-Vitamin D (CALCIUM CITRATE + D PO) Take 1 tablet by mouth 2 (two) times daily.    Yes [provider]  Cholecalciferol (VITAMIN D3) 125 MCG (5000 UT) CAPS Take 5,000 Units by mouth daily.    Yes [provider]  Cod Liver Oil OIL Take 10 mLs by mouth daily.   Yes [provider]  Coenzyme Q10-Vitamin E (QUNOL ULTRA COQ10 PO) Take 10 mLs by mouth daily.   Yes [provider]  Cyanocobalamin (B-12) 500 MCG TABS Take 500 mcg by mouth daily.    Yes [provider]  diphenhydrAMINE (BENADRYL) 25 MG tablet Take 25 mg by mouth at bedtime as needed (seasonal allergies).    Yes [provider]  Estradiol (VAGIFEM) 10 MCG TABS vaginal tablet Place 10 mcg vaginally every Sunday. One tab per week    Yes [provider]  Fexofenadine HCl (ALLEGRA PO) Take 1 tablet by mouth daily as needed (seasonal allergies).   Yes [provider]  folic acid (FOLVITE) 1 MG tablet Take 1 mg by mouth 2 (two) times daily.    Yes [provider]  isosorbide mononitrate (IMDUR) 60 MG 24 hr tablet Take 1 tablet (60 mg total) by mouth daily. 08/10/18  Yes Kroeger, Daleen Snook M., PA-C  levothyroxine (SYNTHROID) 100 MCG tablet Take 100 mcg by mouth daily before breakfast.    Yes [provider]    Lutein-Zeaxanthin 25-5 MG CAPS Take 1 capsule by mouth daily.   Yes [provider]  Magnesium 250 MG TABS Take 250 mg by mouth daily.    Yes [provider]  methotrexate 2.5 MG tablet Take 12.5 mg by mouth every Sunday.    Yes [provider]  Multiple Vitamin (MULTIVITAMIN WITH MINERALS) TABS tablet Take 1 tablet by mouth daily.   Yes [provider]  nitroGLYCERIN (NITROSTAT) 0.4 MG SL tablet Place 1 tablet (0.4 mg total) under the tongue every 5 (five) minutes x 3 doses as needed for chest pain. 08/08/18  Yes Kroeger, Daleen Snook M., PA-C  omega-3 acid ethyl esters (LOVAZA) 1 g capsule Take 2 capsules (2 g total) by mouth daily. 08/09/18  Yes Kroeger, Daleen Snook M., PA-C  rosuvastatin (CRESTOR) 40 MG tablet Take 1 tablet (40 mg total) by mouth daily at 6 PM. 08/08/18  Yes Kroeger, Daleen Snook M., PA-C  sodium chloride (OCEAN) 0.65 % SOLN nasal spray Place 1 spray into both nostrils as needed for  congestion.   Yes [provider]  ticagrelor (BRILINTA) 90 MG TABS tablet Take 1 tablet (90 mg total) by mouth 2 (two) times daily. 08/08/18  Yes Kroeger, Lorelee Cover., PA-C  ticagrelor (BRILINTA) 90 MG TABS tablet Take 1 tablet (90 mg total) by mouth 2 (two) times daily. Patient not taking: Reported on 08/14/2018 08/08/18   Abigail Butts., PA-C     Allergies:    Allergies  Allergen Reactions  . Neosporin [Neomycin-Bacitracin Zn-Polymyx] Other (See Comments)    Patient states that wounds or scratches never heal. (pt uses bacitracin)   . Seasonal Ic [Cholestatin] Other (See Comments)    Leg cramps  . Triamcinolone Other (See Comments)    Burning sensation  . Atorvastatin Other (See Comments)    Leg cramps  . Monascus Purpureus Went Yeast Other (See Comments)    "Red Yeast Rice" causes leg cramps  . Pravastatin Other (See Comments)    Leg cramps    Social History:   Social History   Socioeconomic History  . Marital status: Married    Spouse name: Not on  file  . Number of children: 5  . Years of education: Not on file  . Highest education level: Not on file  Occupational History  . Not on file  Social Needs  . Financial resource strain: Not on file  . Food insecurity:    Worry: Not on file    Inability: Not on file  . Transportation needs:    Medical: Not on file    Non-medical: Not on file  Tobacco Use  . Smoking status: Former Smoker    Packs/day: 0.50    Years: 10.00    Pack years: 5.00    Types: Cigarettes    Last attempt to quit: 11/19/1973    Years since quitting: 44.7  . Smokeless tobacco: Never Used  Substance and Sexual Activity  . Alcohol use: Yes    Alcohol/week: 0.0 standard drinks    Comment: very rare glass of wine  . Drug use: No  . Sexual activity: Yes  Lifestyle  . Physical activity:    Days per week: Not on file    Minutes per session: Not on file  . Stress: Not on file  Relationships  . Social connections:    Talks on phone: Not on file    Gets together: Not on file    Attends religious service: Not on file    Active member of club or organization: Not on file    Attends meetings of clubs or organizations: Not on file    Relationship status: Not on file  . Intimate partner violence:    Fear of current or ex partner: Not on file    Emotionally abused: Not on file    Physically abused: Not on file    Forced sexual activity: Not on file  Other Topics Concern  . Not on file  Social History Narrative   Lives with husband in [place].   Has 5 children.   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    Family History:   The patient's family history includes CVA in her mother; Hypertension in her mother. There is no history of Colon cancer, Esophageal cancer, Stomach cancer, or Rectal cancer.    ROS:  Please see the history of present illness.  All other ROS reviewed and negative.     Physical Exam/Data:   Vitals:  08/14/18 0900 08/14/18 0930  08/14/18 0945 08/14/18 1000  BP: 134/65 136/66 (!) 148/81   Pulse: (!) 51 (!) 52 (!) 59 (!) 50  Resp:      Temp:      TempSrc:      SpO2: 98% 100% 99% 100%  Weight:      Height:       No intake or output data in the 24 hours ending 08/14/18 1136 Filed Weights   08/14/18 0540  Weight: 76.2 kg   Body mass index is 26.31 kg/m.  General:  Well nourished, well developed, in no acute distress HEENT: normal Lymph: no adenopathy Neck: no  JVD Endocrine:  No thryomegaly Vascular: No carotid bruits; FA pulses 2+ bilaterally without bruits  Cardiac:  normal S1, S2; RRR; no murmur  Lungs:  clear to auscultation bilaterally, no wheezing, rhonchi or rales  Abd: soft, nontender, no hepatomegaly  Ext: no edema Musculoskeletal:  No deformities, BUE and BLE strength normal and equal Skin: warm and dry  Neuro:  CNs 2-12 intact, no focal abnormalities noted Psych:  Normal affect    EKG:  The ECG that was done 08/14/18 was personally reviewed and demonstrates sinus bradycardia 53 bpm  Relevant CV Studies: Left heart catheterization 08/08/18:  Post Atrio lesion is 100% stenosed.  Mid RCA lesion is 20% stenosed.  Prox LAD to Mid LAD lesion is 60% stenosed.  LV end diastolic pressure is normal.  Preserved global LV function with EF 55% and a focal area of mild mid posterior hypokinesis. LVEDP 14 mm Hg.  Two-vessel coronary artery disease with smooth 60 to 65% mid LAD stenosis which did not significantly improve following IC nitroglycerin administration, a normal large left circumflex coronary artery; and a dominant RCA with smooth 20% mid stenosis and a suggestion of abrupt cut off the in the mid posterior lateral vessel which was small caliber.  RECOMMENDATION: The patient's wall motion abnormality and inferior T wave inversion is consistent with the mid posterior lateral abrupt cut off occlusion. Although the LAD lesion is 60 to 65%, it is not felt to be the "culprit " vessel as  suggested by the mid posterior wall motion abnormality. In this patient with ACS and positive troponin to 15.61, recommend dual antiplatelet therapy for minimum of 1 year. Medical therapy for concomitant CAD. Aggressive lipid-lowering therapy with target LDL less than 70.  Echocardiogram 08/07/18: Study Conclusions  - Left ventricle: The cavity size was normal. Systolic function was normal. The estimated ejection fraction was in the range of 60% to 65%. Wall motion was normal; there were no regional wall motion abnormalities. Features are consistent with a pseudonormal left ventricular filling pattern, with concomitant abnormal relaxation and increased filling pressure (grade 2 diastolic dysfunction). - Aortic valve: Transvalvular velocity was within the normal range. There was no stenosis. There was mild regurgitation. Valve area (VTI): 2.82 cm^2. Valve area (Vmax): 2.6 cm^2. Valve area (Vmean): 2.89 cm^2. - Mitral valve: Mildly thickened leaflets . There was moderate regurgitation. - Left atrium: The atrium was moderately dilated. - Right ventricle: The cavity size was normal. Wall thickness was normal. Systolic function was normal. RV systolic pressure (S, est): 32 mm Hg. - Right atrium: The atrium was normal in size. - Tricuspid valve: There was trivial regurgitation. - Inferior vena cava: The vessel was normal in size. The respirophasic diameter changes were in the normal range (>= 50%), consistent with normal central venous pressure. - Pericardium, extracardiac: There was no pericardial effusion.  Laboratory  Data:  Chemistry Recent Labs  Lab 08/09/18 0405 08/14/18 0547  NA 142 142  K 3.4* 3.3*  CL 114* 108  CO2 22 22  GLUCOSE 101* 99  BUN <5* 11  CREATININE 0.66 0.96  CALCIUM 8.3* 9.1  GFRNONAA >60 57*  GFRAA >60 >60  ANIONGAP 6 12    No results for input(s): PROT, ALBUMIN, AST, ALT, ALKPHOS, BILITOT in the last 168  hours. Hematology Recent Labs  Lab 08/09/18 0405 08/14/18 0547  WBC 7.4 7.5  RBC 3.52* 3.98  HGB 10.5* 12.0  HCT 33.6* 38.0  MCV 95.5 95.5  MCH 29.8 30.2  MCHC 31.3 31.6  RDW 14.0 13.7  PLT 220 272   Cardiac Enzymes Recent Labs  Lab 08/08/18 1552 08/08/18 2157 08/09/18 0405  TROPONINI 4.05* 4.04* 3.60*    Recent Labs  Lab 08/14/18 0638 08/14/18 0934  TROPIPOC 0.58* 0.52*    BNPNo results for input(s): BNP, PROBNP in the last 168 hours.  DDimer No results for input(s): DDIMER in the last 168 hours.  Radiology/Studies:  Dg Chest 2 View  Result Date: 08/14/2018 CLINICAL DATA:  77 year old female with chest pain. EXAM: CHEST - 2 VIEW COMPARISON:  Chest radiograph dated 08/07/2018 FINDINGS: The lungs are clear. There is no pleural effusion pneumothorax. Stable cardiac silhouette. Thyroidectomy surgical clips. No acute osseous pathology. IMPRESSION: No active cardiopulmonary disease. Electronically Signed   By: Anner Crete M.D.   On: 08/14/2018 06:30    Assessment and Plan:   Bonnie [name] has a PMH of GERD, HLD, hypothyroidism, RA, thalassemia and recent admission for NSTEMI 08/07/18-08/09/18 (no PCI, medical management only), who returns to the ED w/ CC of palpitations and noted to be bradycardic.   1. Palpitations/ Bradycardia: pt notes episode of tachy palpitations that lasted several hours but resolved by the time she made it to the ED. EKG and tele shows sinus bradycardia. K 3.3. Will order Kdur 40 meq. Mg WNL. Renal function and H/H normal. She had a thyroidectomy years ago and has been treated with levothyroxine, however TSH and free T3/T4 were checked last week and WNL. Recent echo showed normal LVEF. She is feeling better currently. Symptoms resolved. HR in the mid 50s on tele. BP stable.  Ok to d/c from the ED and plan for outpatient monitor for further surveillance. We will place order for monitor. Our office will call to arrange pick-up. She has a clinic appt  scheduled for tomorrow as well.   2. CAD: s/p recent NSTEMI 08/07/18 with cath showing 2 vessel CAD with 60-65% mLAD stenosis which did not improve with IC nitro, 20% mRCA stenosis with an abrupt cut off in the mid posterior lateral vessel which was small caliber (suspected to be culprit lesion), and a normal LCx. She was recommended for aspirin and brilinta x1 year given trop elevation, and aggressive risk factor modifications. Crestor and Imdur also added. EF normal by echo. She denies any recurrent CP and troponins continue to trend downward. Troponin peaked at 15 last admission and was down to 3.6 day of discharge. Repeat troponins today at 0.58>>0.52. Continue medical therapy.    For questions or updates, please contact [place] Please consult [email or web address] for contact info under        Signed, Lyda Jester, PA-C  08/14/2018 11:36 AM    Agree with note written by Ellen Henri  [facility]  We were asked to see Bonnie [name] because of palpitations.  She is a cardiology patient of Dr. Rosezella Florida.  She  was recently admitted last week with non-STEMI and underwent cardiac catheterization revealing a distal PLA obstruction too small to intervene on and was treated medically.  Her troponins were low.  She was just discharged home last Wednesday.  She comes in with palpitations today which are more noticeable than her usual symptoms although on telemetry she has sinus rhythm and her EKG shows sinus bradycardia.  She also complains of some shortness of breath which may be related to the Brilinta.  Her exam is benign.  She is somewhat hypokalemic and we will replete.  She is also somewhat bradycardic as well on beta-blocker.  At this point, I do not feel compelled to admit her.  She has an appointment with Dr. Percival Spanish tomorrow in the office.  Will will arrange for her to have an outpatient event monitor.   [name] 08/14/2018 1:12 PM

## 2018-08-14 NOTE — Discharge Instructions (Addendum)
It was our pleasure to provide your ER care today - we hope that you feel better.  Follow up with your cardiologist tomorrow as planned.   On the monitor, your heart rate is as slow as 40 beats per minute. Discuss with your cardiologist tomorrow, discuss possible outpatient monitoring.   Your potassium level is mildly low (3.3) - eat plenty of fruits and vegetables, and follow up with your doctor.   Return to ER if worse, new symptoms, recurrent or persistent chest pain, trouble breathing, fainting, other concern.

## 2018-08-14 NOTE — Telephone Encounter (Signed)
Noted: nurse phone contact with patient for TCM. Signed:  Crissie Sickles, MD           08/14/2018

## 2018-08-14 NOTE — Telephone Encounter (Signed)
Attempted to contact patient, LMTCB. Left call back number.

## 2018-08-14 NOTE — ED Notes (Signed)
Cardiology at bedside.

## 2018-08-14 NOTE — Telephone Encounter (Signed)
Transition Care Management Follow-up Telephone Call  Admit date: 08/07/2018 Discharge date: 08/09/2018 Principal Problem:  NSTEMI (non-ST elevated myocardial infarction)  Patient returned to ER today (08/14/18) for palpitations, discharged home.    How have you been since you were released from the hospital? "I'm okay now"   Do you understand why you were in the hospital? yes, "heart attack"   Do you understand the discharge instructions? yes   Where were you discharged to? Home. Family with patient.    Items Reviewed:  Medications reviewed: yes, addition of Brilinta. Advised to bring medications to appt.   Allergies reviewed: yes  Dietary changes reviewed: yes  Referrals reviewed: yes, F/U with Cardiology 08/15/18.    Functional Questionnaire:   Activities of Daily Living (ADLs):   She states they are independent in the following: ambulation, bathing and hygiene, feeding, continence, grooming, toileting and dressing States they require assistance with the following: None.    Any transportation issues/concerns?: no   Any patient concerns? no   Confirmed importance and date/time of follow-up visits scheduled yes  Provider Appointment booked with PCP on Friday, 08/18/18.   Confirmed with patient if condition begins to worsen call PCP or go to the ER.  Patient was given the office number and encouraged to call back with question or concerns.  : yes

## 2018-08-14 NOTE — ED Notes (Signed)
MD Ashok Cordia informed of istat result reading 0.52

## 2018-08-14 NOTE — Progress Notes (Signed)
Order for 14 day monitor placed. Our office will call to arrange pickup date and time.

## 2018-08-14 NOTE — ED Provider Notes (Signed)
TIME SEEN: 5:54 AM  CHIEF COMPLAINT: Palpitations  HPI: Patient is a 77 year old female with history of rheumatoid arthritis, hyperlipidemia, hypothyroidism, recent NSTEMI who presents to the emergency department with palpitations.  States they started at 4 AM while at rest.  Lasted approximately 1 hour and then resolved.  No chest pain or chest discomfort.  States that she felt like she was hyperventilating.  No nausea, vomiting.  No sweating or dizziness.  Feels better at this time.  States she has had intermittent palpitations before but no diagnosis of arrhythmia.  Cath 08/08/18:   Post Atrio lesion is 100% stenosed.  Mid RCA lesion is 20% stenosed.  Prox LAD to Mid LAD lesion is 60% stenosed.  LV end diastolic pressure is normal.   Preserved global LV function with EF 55% and a focal area of mild mid posterior hypokinesis. LVEDP 14 mm Hg.  Two-vessel coronary artery disease with smooth 60 to 65% mid LAD stenosis which did not significantly improve following IC nitroglycerin administration, a normal large left circumflex coronary artery; and a dominant RCA with smooth 20% mid stenosis and a suggestion of abrupt cut off the in the mid posterior lateral vessel which was small caliber.  RECOMMENDATION: The patient's wall motion abnormality and inferior T wave inversion is consistent with the mid posterior lateral abrupt cut off occlusion.  Although the LAD lesion is 60 to 65%, it is not felt to be the "culprit " vessel as suggested by the mid posterior wall motion abnormality. In this patient with ACS and positive troponin to 15.61, recommend dual antiplatelet therapy for minimum of 1 year.  Medical therapy for concomitant CAD.  Aggressive lipid-lowering therapy with target LDL less than 70.   Echo 08/07/18:  Study Conclusions  - Left ventricle: The cavity size was normal. Systolic function was   normal. The estimated ejection fraction was in the range of 60%   to 65%. Wall motion  was normal; there were no regional wall   motion abnormalities. Features are consistent with a pseudonormal   left ventricular filling pattern, with concomitant abnormal   relaxation and increased filling pressure (grade 2 diastolic   dysfunction). - Aortic valve: Transvalvular velocity was within the normal range.   There was no stenosis. There was mild regurgitation. Valve area   (VTI): 2.82 cm^2. Valve area (Vmax): 2.6 cm^2. Valve area   (Vmean): 2.89 cm^2. - Mitral valve: Mildly thickened leaflets . There was moderate   regurgitation. - Left atrium: The atrium was moderately dilated. - Right ventricle: The cavity size was normal. Wall thickness was   normal. Systolic function was normal. RV systolic pressure (S,   est): 32 mm Hg. - Right atrium: The atrium was normal in size. - Tricuspid valve: There was trivial regurgitation. - Inferior vena cava: The vessel was normal in size. The   respirophasic diameter changes were in the normal range (>= 50%),   consistent with normal central venous pressure. - Pericardium, extracardiac: There was no pericardial effusion.   ROS: See HPI Constitutional: no fever  Eyes: no drainage  ENT: no runny nose   Cardiovascular:  no chest pain  Resp: no SOB  GI: no vomiting GU: no dysuria Integumentary: no rash  Allergy: no hives  Musculoskeletal: no leg swelling  Neurological: no slurred speech ROS otherwise negative  PAST MEDICAL HISTORY/PAST SURGICAL HISTORY:  Past Medical History:  Diagnosis Date  . Beta thalassemia, heterozygous    ?alpha? pt does not recall.  . Fatigue   .  GERD (gastroesophageal reflux disease)   . Hemorrhoids   . History of adenomatous polyp of colon   . Hyperlipidemia   . 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  Surgical path NEG for atypica or  malignancy.  . NSTEMI (non-ST elevated myocardial infarction) ([place]) 07/2018  . Rheumatoid arthritis ([place]) 2007   muscle weaknes at times (Dr. Amil Amen)    MEDICATIONS:  Prior to Admission medications   Medication Sig Start Date End Date Taking? Authorizing Provider  acetaminophen (TYLENOL) 500 MG tablet Take 500 mg by mouth every 6 (six) hours as needed for headache (pain).    [provider]  aspirin EC 81 MG tablet Take 81 mg by mouth at bedtime.    [provider]  Calcium Citrate-Vitamin D (CALCIUM CITRATE + D PO) Take 1 tablet by mouth 2 (two) times daily.     [provider]  Cholecalciferol (VITAMIN D3) 125 MCG (5000 UT) CAPS Take 5,000 Units by mouth daily.     [provider]  Cod Liver Oil OIL Take 10 mLs by mouth daily.    [provider]  Coenzyme Q10-Vitamin E (QUNOL ULTRA COQ10 PO) Take 10 mLs by mouth daily.    [provider]  Cyanocobalamin (B-12) 500 MCG TABS Take 500 mcg by mouth daily.     [provider]  diphenhydrAMINE (BENADRYL) 25 MG tablet Take 25 mg by mouth at bedtime as needed (seasonal allergies).     [provider]  Estradiol (VAGIFEM) 10 MCG TABS vaginal tablet Place 10 mcg vaginally every Sunday. One tab per week     [provider]  Fexofenadine HCl (ALLEGRA PO) Take 1 tablet by mouth daily as needed (seasonal allergies).    [provider]  folic acid (FOLVITE) 1 MG tablet Take 1 mg by mouth 2 (two) times daily.     [provider]  isosorbide mononitrate (IMDUR) 60 MG 24 hr tablet Take 1 tablet (60 mg total) by mouth daily. 08/10/18   Kroeger, Lorelee Cover., PA-C  levothyroxine (SYNTHROID) 100 MCG tablet Take 100 mcg by mouth daily before breakfast.     [provider]  Lutein-Zeaxanthin 25-5 MG CAPS Take 1 capsule by mouth daily.    [provider]  Magnesium 250 MG TABS Take 250 mg by mouth daily.     [provider]  methotrexate 2.5  MG tablet Take 12.5 mg by mouth every Sunday.     [provider]  Multiple Vitamin (MULTIVITAMIN WITH MINERALS) TABS tablet Take 1 tablet by mouth daily.    [provider]  nitroGLYCERIN (NITROSTAT) 0.4 MG SL tablet Place 1 tablet (0.4 mg total) under the tongue every 5 (five) minutes x 3 doses as needed for chest pain. 08/08/18   Kroeger, Lorelee Cover., PA-C  omega-3 acid ethyl esters (LOVAZA) 1 g capsule Take 2 capsules (2 g total) by mouth daily. 08/09/18   Kroeger, Lorelee Cover., PA-C  rosuvastatin (CRESTOR) 40 MG tablet Take 1 tablet (40 mg total) by mouth daily at 6 PM. 08/08/18   Kroeger, Lorelee Cover., PA-C  sodium chloride (OCEAN) 0.65 % SOLN nasal spray Place 1 spray into both nostrils as needed for congestion.    [provider]  ticagrelor (BRILINTA) 90 MG TABS tablet Take 1 tablet (90 mg total) by mouth 2 (two) times daily. 08/08/18   Kroeger,  Lorelee Cover., PA-C  ticagrelor (BRILINTA) 90 MG TABS tablet Take 1 tablet (90 mg total) by mouth 2 (two) times daily. 08/08/18   Kroeger, Lorelee Cover., PA-C    ALLERGIES:  Allergies  Allergen Reactions  . Neosporin [Neomycin-Bacitracin Zn-Polymyx] Other (See Comments)    Patient states that wounds or scratches never heal. (pt uses bacitracin)   . Seasonal Ic [Cholestatin] Other (See Comments)    Leg cramps  . Triamcinolone Other (See Comments)    Burning sensation  . Atorvastatin Other (See Comments)    Leg cramps  . Monascus Purpureus Went Yeast Other (See Comments)    "Red Yeast Rice" causes leg cramps  . Pravastatin Other (See Comments)    Leg cramps    SOCIAL HISTORY:  Social History   Tobacco Use  . Smoking status: Former Smoker    Packs/day: 0.50    Years: 10.00    Pack years: 5.00    Types: Cigarettes    Last attempt to quit: 11/19/1973    Years since quitting: 44.7  . Smokeless tobacco: Never Used  Substance Use Topics  . Alcohol use: Yes    Alcohol/week: 0.0 standard drinks    Comment: very rare glass of  wine    FAMILY HISTORY: Family History  Problem Relation Age of Onset  . Hypertension Mother   . CVA Mother   . Colon cancer Neg Hx   . Esophageal cancer Neg Hx   . Stomach cancer Neg Hx   . Rectal cancer Neg Hx     EXAM: BP (!) 157/87 (BP Location: Left Arm)   Pulse (!) 54   Temp 97.9 F (36.6 C) (Oral)   Resp 13   Ht 5\' 7"  (1.702 m)   Wt 76.2 kg   SpO2 99%   BMI 26.31 kg/m  CONSTITUTIONAL: Alert and oriented and responds appropriately to questions. Well-appearing; well-nourished HEAD: Normocephalic EYES: Conjunctivae clear, pupils appear equal, EOMI ENT: normal nose; moist mucous membranes NECK: Supple, no meningismus, no nuchal rigidity, no LAD  CARD: RRR; S1 and S2 appreciated; no murmurs, no clicks, no rubs, no gallops RESP: Normal chest excursion without splinting or tachypnea; breath sounds clear and equal bilaterally; no wheezes, no rhonchi, no rales, no hypoxia or respiratory distress, speaking full sentences ABD/GI: Normal bowel sounds; non-distended; soft, non-tender, no rebound, no guarding, no peritoneal signs, no hepatosplenomegaly BACK:  The back appears normal and is non-tender to palpation, there is no CVA tenderness EXT: Normal ROM in all joints; non-tender to palpation; no edema; normal capillary refill; no cyanosis, no calf tenderness or swelling    SKIN: Normal color for age and race; warm; no rash NEURO: Moves all extremities equally PSYCH: The patient's mood and manner are appropriate. Grooming and personal hygiene are appropriate.  MEDICAL DECISION MAKING: Patient here with episode of palpitations.  States she thinks her heart rate was in the 90s at home.  She does have an app on her phone which captured an EKG using her finger and picked up a rate of 120s with possible irregular rhythm suggestive of A. fib.  She denies history of the same.  She did have palpitations while in the hospital during her recent admission and no arrhythmia was captured.  She  denies any chest pain or shortness of breath.  Currently asymptomatic with heart rate in the 50s.  EKG shows no new ischemic changes, interval abnormalities or arrhythmia.  Will check labs today, chest x-ray.  Patient is on cardiac monitoring.  ED  PROGRESS: Patient's labs unremarkable.  Electrolytes unremarkable.  Troponin slightly positive at 0.58 but this is in the setting of a recent NSTEMI where her troponin peaked around 15.  I suspect that this is her troponin still downtrending.  Will repeat troponin in 3 hours to ensure that this is not rising.  We will continue to watch her closely on the cardiac monitor.  No further palpitations here in the ED.  Signed out to Dr. Ashok Cordia to follow up on repeat troponin.    I reviewed all nursing notes, vitals, pertinent previous records, EKGs, lab and urine results, imaging (as available).      EKG Interpretation  Date/Time:  Monday August 14 2018 05:44:25 EST Ventricular Rate:  53 PR Interval:    QRS Duration: 108 QT Interval:  427 QTC Calculation: 401 R Axis:   21 Text Interpretation:  Sinus rhythm Abnormal R-wave progression, early transition Borderline T wave abnormalities Inferolateral T wave changes have improved compared to previous Confirmed by Kolbey Teichert, Cyril Mourning [phone]) on 08/14/2018 5:56:45 AM         Aliciana Ricciardi, Delice Bison, DO 08/14/18 0720

## 2018-08-14 NOTE — ED Triage Notes (Signed)
Pt woke up at 0400 with palpitations. Just d/c'd from here after MI on Wed last week. Feeling better now. States she was hyperventilating in the moment, but denies SOB, diaphoresis, N/V, or other s/sx.

## 2018-08-15 ENCOUNTER — Encounter: Payer: Self-pay | Admitting: Adult Health

## 2018-08-15 ENCOUNTER — Ambulatory Visit (INDEPENDENT_AMBULATORY_CARE_PROVIDER_SITE_OTHER): Payer: Medicare Other | Admitting: Adult Health

## 2018-08-15 VITALS — BP 114/65 | HR 57 | Ht 66.0 in | Wt 157.8 lb

## 2018-08-15 DIAGNOSIS — I493 Ventricular premature depolarization: Secondary | ICD-10-CM

## 2018-08-15 DIAGNOSIS — E7800 Pure hypercholesterolemia, unspecified: Secondary | ICD-10-CM | POA: Diagnosis not present

## 2018-08-15 DIAGNOSIS — E876 Hypokalemia: Secondary | ICD-10-CM | POA: Diagnosis not present

## 2018-08-15 DIAGNOSIS — I2510 Atherosclerotic heart disease of native coronary artery without angina pectoris: Secondary | ICD-10-CM | POA: Diagnosis not present

## 2018-08-15 MED ORDER — POTASSIUM CHLORIDE ER 8 MEQ PO TBCR: 16.0000 meq | EXTENDED_RELEASE_TABLET | Freq: Every day | ORAL | 6 refills | Status: DC

## 2018-08-15 NOTE — Telephone Encounter (Signed)
Patient seen post hosp today

## 2018-08-15 NOTE — Patient Instructions (Signed)
Medication Instructions:  NO CHANGES- Your physician recommends that you continue on your current medications as directed. Please refer to the Current Medication list given to you today.  If you need a refill on your cardiac medications before your next appointment, please call your pharmacy.  Labwork: BMET AND MAG IN 2 WEEKS HERE IN OUR OFFICE AT [facility]  Take the provided lab slips with you to the lab for your blood draw.   You will NOT need to fast   If you have labs (blood work) drawn today and your tests are completely normal, you will receive your results only by: Marland Kitchen MyChart Message (if you have MyChart) OR . A paper copy in the mail If you have any lab test that is abnormal or we need to change your treatment, we will call you to review the results.  Testing/Procedures: Your physician has recommended that you wear an event monitor-14 day. Event monitors are medical devices that record the heart's electrical activity. Doctors most often [place] these monitors to diagnose arrhythmias. Arrhythmias are problems with the speed or rhythm of the heartbeat. The monitor is a small, portable device. You can wear one while you do your normal daily activities. This is usually used to diagnose what is causing palpitations/syncope (passing out). This will be performed at our [facility] location - [address], [address].  Special Instructions: CLEARED FOR CARDIAC REHAB-I HAVE CONTACTED THEM, THEY WILL BE CALLING TO SCHEDULE APPOINTMENTS  Follow-Up: You will need a follow up appointment in 1 MONTH.   You may see  DR Hans Eden, DNP, AACC or one of the following Advanced Practice Providers on your designated Care Team:    . Jory Sims, DNP, ANP . Rosaria Ferries, PA-C  At [facility], you and your health needs are our priority.  As part of our continuing mission to provide you with exceptional heart care, we have created designated Provider Care Teams.  These Care Teams  include your primary Cardiologist (physician) and Advanced Practice Providers (APPs -  Physician Assistants and Nurse Practitioners) who all work together to provide you with the care you need, when you need it.  Thank you for choosing [facility]!!

## 2018-08-18 ENCOUNTER — Telehealth: Payer: Self-pay

## 2018-08-18 ENCOUNTER — Ambulatory Visit (INDEPENDENT_AMBULATORY_CARE_PROVIDER_SITE_OTHER): Payer: Medicare Other | Admitting: Family Medicine

## 2018-08-18 ENCOUNTER — Encounter: Payer: Self-pay | Admitting: Family Medicine

## 2018-08-18 VITALS — BP 115/67 | HR 57 | Temp 98.2°F | Resp 16 | Ht 66.0 in | Wt 156.5 lb

## 2018-08-18 DIAGNOSIS — I214 Non-ST elevation (NSTEMI) myocardial infarction: Secondary | ICD-10-CM | POA: Diagnosis not present

## 2018-08-18 DIAGNOSIS — E876 Hypokalemia: Secondary | ICD-10-CM | POA: Diagnosis not present

## 2018-08-18 DIAGNOSIS — I25110 Atherosclerotic heart disease of native coronary artery with unstable angina pectoris: Secondary | ICD-10-CM | POA: Diagnosis not present

## 2018-08-18 DIAGNOSIS — R002 Palpitations: Secondary | ICD-10-CM | POA: Diagnosis not present

## 2018-08-18 MED ORDER — LORAZEPAM 0.5 MG PO TABS: ORAL_TABLET | ORAL | 1 refills | Status: DC

## 2018-08-18 NOTE — Progress Notes (Signed)
08/18/2018  CC:  Chief Complaint  Patient presents with  . Hospitalization Follow-up    TCM    Patient is a 77 y.o. Caucasian female who presents accompanied by her husband Vinnie for hospital follow up, specifically [facility] face-to-face visit. Dates hospitalized: 11/25-11/27, 2019. Days since d/c from hospital: 9 days Patient was discharged from hospital to home. Reason for admission to hospital: CP-->NSTEMI Date of interactive (phone) contact with patient and/or caregiver: 08/14/18.  I have reviewed patient's discharge summary plus pertinent specific notes, labs, and imaging from the hospitalization.   Admitted for CP, +TnI, NSTEMI-->cath showed small vessell with abrupt cutoff which was not amenable to intervention.  Otherwise mild-mod 2 V CAD, medical mgmt recommended-->DAPT for minimum of 1 yr. EF 60-65%, no wall motion abnl, grd II DD, mod MR.  Current status of symptoms: Revisit to ED 08/14/18 for palpitations.  No arrhythmia on monitoring, TnI still trending down, cardiology consulted and arranged f/u the next day to get started on outpt monitoring. When seen by cardiology for f/u outpt visit, she was to be set up for 7d event monitor, plus potassium and magnesium supplements were started. Still feeling tired and when she walks she feels a bit out of breath and fatigued easily when walking.  No CP.  No dizziness, no nausea, no diaphoresis, no LE swelling. She has appt set up for event monitor 08/22/18. She has not specifically felt any further palpitations since seeing cardiology 08/15/18, just the breathlessness with movement.  She is set up for f/u labs with cardiology 08/31/18. She wants to do cardiac rehab but is not set up yet.  Medication reconciliation was done today and patient is taking meds as recommended by discharging hospitalist/specialist.    PMH:  Past Medical History:  Diagnosis Date  . Beta thalassemia, heterozygous    ?alpha? pt does not  recall.  . Fatigue   . GERD (gastroesophageal reflux disease)   . Hemorrhoids   . History of adenomatous polyp of colon   . Hyperlipidemia   . 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  Surgical path NEG for atypica or malignancy.  . NSTEMI (non-ST elevated myocardial infarction) ([place]) 07/2018  . Rheumatoid arthritis ([place]) 2007   muscle weaknes at times (Dr. Amil Amen)    [place]:  Past Surgical History:  Procedure Laterality Date  . ABDOMINAL HYSTERECTOMY  1987   DUB, no malignancy.  Ovaries still in.  Marland Kitchen BLADDER REPAIR    . CHOLECYSTECTOMY  1969  . COLONOSCOPY  11/07/2017   2014 adenoma.  Rpt 10/2017 adenoma x 1.  No further colonoscopies needed per GI.   Marland Kitchen LEFT HEART CATH AND CORONARY ANGIOGRAPHY N/A 08/08/2018   Procedure: LEFT HEART CATH AND CORONARY ANGIOGRAPHY;  Surgeon: Troy Sine, MD;  Location: [facility];  Service: Cardiovascular;  Laterality: N/A;  . THYROIDECTOMY  2012   cytologic atypia on nodule bx; surgical path NEG for atypica or malignancy.  Marland Kitchen UPPER GASTROINTESTINAL ENDOSCOPY    . VAGINAL PROLAPSE REPAIR  2012    MEDS:  Outpatient Medications Prior to Visit  Medication Sig Dispense Refill  . acetaminophen (TYLENOL) 500 MG tablet Take 500 mg by mouth every 6 (six) hours as needed for headache (pain).    Marland Kitchen aspirin EC 81 MG tablet Take 81 mg by mouth at bedtime.    . Calcium Citrate-Vitamin  D (CALCIUM CITRATE + D PO) Take 1 tablet by mouth 2 (two) times daily.     . Cholecalciferol (VITAMIN D3) 125 MCG (5000 UT) CAPS Take 5,000 Units by mouth daily.     . Cod Liver Oil OIL Take 10 mLs by mouth daily.    . Coenzyme Q10-Vitamin E (QUNOL ULTRA COQ10 PO) Take 10 mLs by mouth daily.    . Cyanocobalamin (B-12) 500 MCG TABS Take 500 mcg by mouth daily.     . diphenhydrAMINE (BENADRYL) 25 MG tablet Take  25 mg by mouth at bedtime as needed (seasonal allergies).     . Estradiol (VAGIFEM) 10 MCG TABS vaginal tablet Place 10 mcg vaginally every Sunday. One tab per week     . Fexofenadine HCl (ALLEGRA PO) Take 1 tablet by mouth daily as needed (seasonal allergies).    . folic acid (FOLVITE) 1 MG tablet Take 1 mg by mouth 2 (two) times daily.     . isosorbide mononitrate (IMDUR) 60 MG 24 hr tablet Take 1 tablet (60 mg total) by mouth daily. 30 tablet 3  . levothyroxine (SYNTHROID) 100 MCG tablet Take 100 mcg by mouth daily before breakfast.     . Lutein-Zeaxanthin 25-5 MG CAPS Take 1 capsule by mouth daily.    . Magnesium 250 MG TABS Take 250 mg by mouth daily.     . methotrexate 2.5 MG tablet Take 12.5 mg by mouth every Sunday.     . Multiple Vitamin (MULTIVITAMIN WITH MINERALS) TABS tablet Take 1 tablet by mouth daily.    . nitroGLYCERIN (NITROSTAT) 0.4 MG SL tablet Place 1 tablet (0.4 mg total) under the tongue every 5 (five) minutes x 3 doses as needed for chest pain. 25 tablet 3  . omega-3 acid ethyl esters (LOVAZA) 1 g capsule Take 2 capsules (2 g total) by mouth daily. 90 capsule 3  . potassium chloride (KLOR-CON) 8 MEQ tablet Take 2 tablets (16 mEq total) by mouth daily. 60 tablet 6  . rosuvastatin (CRESTOR) 40 MG tablet Take 1 tablet (40 mg total) by mouth daily at 6 PM. 90 tablet 3  . sodium chloride (OCEAN) 0.65 % SOLN nasal spray Place 1 spray into both nostrils as needed for congestion.    . ticagrelor (BRILINTA) 90 MG TABS tablet Take 1 tablet (90 mg total) by mouth 2 (two) times daily. 60 tablet 0   No facility-administered medications prior to visit.    EXAM: BP 115/67 (BP Location: Left Arm, Patient Position: Sitting, Cuff Size: Normal)   Pulse (!) 57   Temp 98.2 F (36.8 C) (Oral)   Resp 16   Ht 5\' 6"  (1.676 m)   Wt 156 lb 8 oz (71 kg)   SpO2 98%   BMI 25.26 kg/m  Gen: Alert, well appearing.  Patient is oriented to person, place, time, and situation. AFFECT: pleasant, lucid  thought and speech. FMB:WGYK: no injection, icteris, swelling, or exudate.  EOMI, PERRLA. Mouth: lips without lesion/swelling.  Oral mucosa pink and moist. Oropharynx without erythema, exudate, or swelling.  CV: RRR, no m/r/g.   LUNGS: CTA bilat, nonlabored resps, good aeration in all lung fields. ABD: soft, NT/ND EXT: no clubbing or cyanosis.  no edema.    Pertinent labs/imaging Lab Results  Component Value Date   TROPONINI 3.60 ([place]) 08/09/2018     Chemistry      Component Value Date/Time   NA 142 08/14/2018 0547   K 3.3 (L) 08/14/2018 0547   CL 108 08/14/2018  0547   CO2 22 08/14/2018 0547   BUN 11 08/14/2018 0547   CREATININE 0.96 08/14/2018 0547      Component Value Date/Time   CALCIUM 9.1 08/14/2018 0547   ALKPHOS 56 12/04/2012 1123   AST 25 12/04/2012 1123   ALT 30 12/04/2012 1123   BILITOT 0.7 12/04/2012 1123     Lab Results  Component Value Date   WBC 7.5 08/14/2018   HGB 12.0 08/14/2018   HCT 38.0 08/14/2018   MCV 95.5 08/14/2018   PLT 272 08/14/2018   Lab Results  Component Value Date   TSH 0.439 08/07/2018   Lab Results  Component Value Date   CHOL 112 08/08/2018   HDL 51 08/08/2018   LDLCALC 50 08/08/2018   LDLDIRECT 155.5 12/04/2012   TRIG 54 08/08/2018   CHOLHDL 2.2 08/08/2018    ASSESSMENT/PLAN:  1) NSTEMI, found to have 2V CAD with small vessel obstructed and not amenable to intervention. LV function/EF normal. Grd II DD. DAPT x 1 yr minimum, crestor, lovaza, imdur.  2) Palpitations: eval for dysrythmia to be done-->7 day event monitor being set up by cardiologist.  3) Hypokalemia: mild. Potassium supplement and magnesium supplement rx'd by cardiologist, with plan for lab f/u already in place.  An After Visit Summary was printed and given to the patient.  FOLLOW UP:  2 mo  Signed:  Crissie Sickles, MD           08/18/2018

## 2018-08-18 NOTE — Telephone Encounter (Signed)
OK. I addressed this in my visit with [facility] today.

## 2018-08-18 NOTE — Telephone Encounter (Signed)
FYI

## 2018-08-18 NOTE — Telephone Encounter (Signed)
Copied from [place] [phone]. Topic: Quick Communication - See Telephone Encounter >> Aug 18, 2018  9:22 AM Berneta Levins wrote: CRM for notification. See Telephone encounter for: 08/18/18.  Pt's daughter calling in.  States that she was supposed to be going with pt to appointment, but pt doesn't want her to go.  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  Pt will be attending with spouse today.  Daughter states that pt is anxious and thinks that she should go on some low dose anxiety medication, but pt doesn't want to address this with PCP. Pt has 5 children who all agree that pt needs to be put on anxiety medication - but children don't want pt to know they have reached out and are requesting this. Danette, daughter, can be reached at [phone]

## 2018-08-20 ENCOUNTER — Encounter: Payer: Self-pay | Admitting: Family Medicine

## 2018-08-21 ENCOUNTER — Other Ambulatory Visit: Payer: Self-pay

## 2018-08-21 NOTE — Patient Outreach (Signed)
[facility]) Care Management  08/21/2018  Bonnie [name] 04/15/1941 [id number]     EMMI-General discharge RED ON EMMI ALERT Day # 4 Date: 08/21/18 Red Alert Reason:"Lost interest in things" Yes  Outreach attempt # 1. Spoke with patient. Reviewed and addressed red alert. Patient states she did not participate in her normal social outing due to "just feeling tired". States she does not feel depressed but just still feels tired from her recent change in health. Patient reports having some palpitations since her ER visit on 08/14/18. She states her PCP and Cardiologist are aware. She denies having chest pain. Patient f/u with her PCP, Dr. Anitra Lauth on 08/18/18. She will f/u with Cardiology tomorrow and pick up her halter monitor. Patient's husband is assisting her with meals, meds and transportation. Patient states her chief complaint is related to taking so many new medications. Medication reconciliation completed with patient and her spouse Bonnie [name]. RN CM educated patient on how to take SL NTG if chest pain occurs. RN CM educated patient on newly prescribed medications, including Potassium, Imdur and Lorazepam. Instructed patient to take her time when standing and or when changing positions to allow time for her BP to adjust. RN CM instructed patient on potential adverse reaction related to Lorazepam, patient instructed to record time and date when she takes this medication to avoid accidental overdose, discussed avoiding driving and or drinking alcohol while taking this medication unless otherwise directed by her doctor. RN CM instructed patient on s/s of new or worsening symptoms such as chest pain, shortness of breath, sudden weakness and or vertigo/syncope. Patient is able to verbalize understanding and knows when to call the doctor if needed, confirms having MD office number within reach. Patient would like to know more about how to better organize her medications and is agreeable to a  [place] referral. Patient denies having any other questions or concerns. Patient recorded the phone number for the [facility] 24 hour nurse advise line, encouraged patient to call 911 for emergency's. Patient is appreciative of the call and would like a call back in about 2 weeks by RN.        Plan: RN CM will make a [place] referral to assist with meds. RN CM will f/u with patient again in about 2 weeks to ensure she received her halter monitor and pharmacy assistance.   Barb Merino, RN,CCM [facility] Care Management Care Management Coordinator Direct Phone: [phone] Toll Free: [phone] Fax: [phone]

## 2018-08-22 ENCOUNTER — Ambulatory Visit (INDEPENDENT_AMBULATORY_CARE_PROVIDER_SITE_OTHER): Payer: Medicare Other

## 2018-08-22 DIAGNOSIS — I493 Ventricular premature depolarization: Secondary | ICD-10-CM | POA: Diagnosis not present

## 2018-08-22 HISTORY — PX: OTHER SURGICAL HISTORY: SHX169

## 2018-08-23 ENCOUNTER — Other Ambulatory Visit: Payer: Self-pay

## 2018-08-23 NOTE — Patient Outreach (Addendum)
[place] [facility]) Care Management  [place]  08/23/2018  Bonnie [name] Jul 18, 1941 [id number]  Reason for referral: post discharge medication review, medication information  Unsuccessful telephone call attempt # 1 to patient.   HIPAA compliant voicemail left requesting a return call.  Plan:  I will make another outreach attempt to patient within 3-4 business days.  Joetta Manners, PharmD Clinical Pharmacist [place] [phone]

## 2018-08-24 NOTE — Telephone Encounter (Signed)
This encounter was created in error - please disregard.

## 2018-08-28 ENCOUNTER — Other Ambulatory Visit: Payer: Self-pay

## 2018-08-28 ENCOUNTER — Ambulatory Visit: Payer: Self-pay

## 2018-08-28 NOTE — Patient Outreach (Signed)
[place] [facility]) Care Management  [place]   08/28/2018  Krystalyn Kubota Pulliam December 09, 1940 [id number]  Reason for referral: polypharmacy medication review  Referral source: [facility] CM RN Current insurance:Medicare  PMHx includes but not limited to:   NSTEMI, allergic rhinitis, goiter, thalassemia, rheumatoid arthritis, and hyperlipidemia.  Outreach:  Successful outreach call to Ms. Parmer and HIPAA identifiers verified.  Ms. Bonnie [name] questions if she needs to take all the vitamins that she has been buying OTC.  She states that her friends told her they were "good for you," but were not recommended by her doctors.  She reports that she is feeling well and has a cardiology follow up in January.  She states that she is able to afford her medications.   Objective: Lab Results  Component Value Date   CREATININE 0.96 08/14/2018   CREATININE 0.66 08/09/2018   CREATININE 0.81 08/08/2018    Lab Results  Component Value Date   HGBA1C 5.7 (H) 08/07/2018    Lipid Panel     Component Value Date/Time   CHOL 112 08/08/2018 0503   TRIG 54 08/08/2018 0503   HDL 51 08/08/2018 0503   CHOLHDL 2.2 08/08/2018 0503   VLDL 11 08/08/2018 0503   LDLCALC 50 08/08/2018 0503   LDLDIRECT 155.5 12/04/2012 1123    BP Readings from Last 3 Encounters:  08/18/18 115/67  08/15/18 114/65  08/14/18 135/72    Allergies  Allergen Reactions  . Neosporin [Neomycin-Bacitracin Zn-Polymyx] Other (See Comments)    Patient states that wounds or scratches never heal. (pt uses bacitracin)   . Seasonal Ic [Cholestatin] Other (See Comments)    Leg cramps  . Triamcinolone Other (See Comments)    Burning sensation  . Atorvastatin Other (See Comments)    Leg cramps  . Monascus Purpureus Went Yeast Other (See Comments)    "Red Yeast Rice" causes leg cramps  . Pravastatin Other (See Comments)    Leg cramps    Medications Reviewed Today    Reviewed by Dionne Milo, [facility] (Pharmacist) on  08/28/18 at 1159  Med List Status: <None>  Medication Order Taking? Sig Documenting Provider Last Dose Status Informant  acetaminophen (TYLENOL) 500 MG tablet [id number] No Take 500 mg by mouth every 6 (six) hours as needed for headache (pain). [provider] Not Taking Active Self  aspirin EC 81 MG tablet [id number] Yes Take 81 mg by mouth at bedtime. [provider] Taking Active Self  Calcium Citrate-Vitamin D (CALCIUM CITRATE + D PO) [id number] Yes Take 1 tablet by mouth 2 (two) times daily.  [provider] Taking Active Self           Med Note Gari Crown, Jill Side   Fri Aug 04, 2018 12:57 PM)    Cholecalciferol (VITAMIN D3) 125 MCG (5000 UT) CAPS [id number] Yes Take 5,000 Units by mouth daily.  [provider] Taking Active Self  Cyanocobalamin (B-12) 500 MCG TABS [id number] Yes Take 500 mcg by mouth daily.  [provider] Taking Active Self  diphenhydrAMINE (BENADRYL) 25 MG tablet [id number] No Take 25 mg by mouth at bedtime as needed (seasonal allergies).  [provider] Not Taking Active Self           Med Note Onalee Hua   Fri Aug 04, 2018 12:58 PM)    Estradiol (VAGIFEM) 10 MCG TABS vaginal tablet [id number] Yes Place 10 mcg vaginally every Sunday. One tab per week  [provider] Taking Active Self  Med Note Gari Crown, Jill Side   Fri Aug 04, 2018 12:58 PM)    Fexofenadine HCl (ALLEGRA PO) [id number] No Take 180 mg by mouth daily as needed (seasonal allergies).  [provider] Not Taking Active Self  folic acid (FOLVITE) 1 MG tablet [id number] Yes Take 1 mg by mouth 2 (two) times daily.  [provider] Taking Active Self           Med Note Gari Crown, Jill Side   Fri Aug 04, 2018 12:59 PM)    isosorbide mononitrate (IMDUR) 60 MG 24 hr tablet [id number] Yes Take 1 tablet (60 mg total) by mouth daily. Kroeger, Lorelee Cover., PA-C Taking Active Self  levothyroxine (SYNTHROID) 100 MCG tablet  [id number] Yes Take 100 mcg by mouth daily before breakfast.  [provider] Taking Active Self  LORazepam (ATIVAN) 0.5 MG tablet [id number] Yes 1-2 tabs po bid prn anxiety McGowen, Adrian Blackwater, MD Taking Active   Magnesium 250 MG TABS [id number] Yes Take 250 mg by mouth daily.  [provider] Taking Active Self  methotrexate 2.5 MG tablet [id number] Yes Take 12.5 mg by mouth every Sunday.  [provider] Taking Active Self  Multiple Vitamin (MULTIVITAMIN WITH MINERALS) TABS tablet [id number] Yes Take 1 tablet by mouth daily. [provider] Taking Active Self  nitroGLYCERIN (NITROSTAT) 0.4 MG SL tablet [id number] Yes Place 1 tablet (0.4 mg total) under the tongue every 5 (five) minutes x 3 doses as needed for chest pain. Abigail Butts., PA-C Taking Active Self  omega-3 acid ethyl esters (LOVAZA) 1 g capsule [id number] Yes Take 2 capsules (2 g total) by mouth daily. Kroeger, Lorelee Cover., PA-C Taking Active Self  potassium chloride (KLOR-CON) 8 MEQ tablet [id number] Yes Take 2 tablets (16 mEq total) by mouth daily. Lendon Colonel, NP Taking Active   rosuvastatin (CRESTOR) 40 MG tablet [id number] Yes Take 1 tablet (40 mg total) by mouth daily at 6 PM. Tommye Standard, Lorelee Cover., PA-C Taking Active Self  sodium chloride (OCEAN) 0.65 % SOLN nasal spray [id number] No Place 1 spray into both nostrils as needed for congestion. [provider] Not Taking Active Self  ticagrelor (BRILINTA) 90 MG TABS tablet [id number] Yes Take 1 tablet (90 mg total) by mouth 2 (two) times daily. Abigail Butts., PA-C Taking Active Self          ASSESSMENT: Date Discharged from Hospital:  08/07/18  Date Medication Reconciliation Performed: 08/28/2018  Medications:  New at Discharge: . isosorbide mononitrate . ticagrelor . nitroglycerin  Adjustments at Discharge: . rosuvastatin  Discontinued at Discharge:   celecoxib  Patient was recently discharged from hospital and all  medications have been reviewed.  Drugs sorted by system:  Neurologic/Psychologic: lorazepam  Cardiovascular: aspirin, isosorbide mononitrate, omega-3 acid, tigagrelor  Pulmonary/Allergy: fexofenadine  Endocrine: levothyroxine   Topical: estradiol vaginal, sodium chloride nasal  Pain: acetaminophen  Vitamins/Minerals/Supplements: calcium/vitamin D , cholecalciferol, cyanocobalamin, folic acid, magnesium, MVI, potassium chloride  Miscellaneous: diphenhydramine  Medication Review Findings:  . Patient states that she is going to stop taking cod liver oil, co-enzyme Q-10 and lutein/zeaxanthin to decrease the amount of medications she takes daily.  . Beta blocker not prescribed at discharge due to baseline bradycardia.  . Salicylates may increase the plasma concentration of methotrexate with a increased risk of bone marrow and hepatotoxicity.   . Per the [place] [employer], diphenhydramine is highly anticholinergic and clearance is reduced with advanced age. Risk of confusion, dry mouth, constipation and other anticholinergic effects or toxicity  may occur.  There is strong evidence to avoid use in the elderly.  PLAN:  Will r oute note to PCP, Dr. Anitra Lauth.  Joetta Manners, PharmD Clinical Pharmacist [place] [phone]

## 2018-08-29 ENCOUNTER — Other Ambulatory Visit: Payer: Medicare Other

## 2018-08-29 DIAGNOSIS — I493 Ventricular premature depolarization: Secondary | ICD-10-CM | POA: Diagnosis not present

## 2018-08-29 LAB — BASIC METABOLIC PANEL
BUN/Creatinine Ratio: 21 (ref 12–28)
BUN: 18 mg/dL (ref 8–27)
CO2: 20 mmol/L (ref 20–29)
CREATININE: 0.85 mg/dL (ref 0.57–1.00)
Calcium: 9.5 mg/dL (ref 8.7–10.3)
Chloride: 107 mmol/L — ABNORMAL HIGH (ref 96–106)
GFR calc Af Amer: 77 mL/min/{1.73_m2} (ref >59)
GFR calc non Af Amer: 67 mL/min/{1.73_m2} (ref >59)
GLUCOSE: 86 mg/dL (ref 65–99)
Potassium: 4.1 mmol/L (ref 3.5–5.2)
Sodium: 141 mmol/L (ref 134–144)

## 2018-08-29 LAB — MAGNESIUM: Magnesium: 2.4 mg/dL — ABNORMAL HIGH (ref 1.6–2.3)

## 2018-08-30 ENCOUNTER — Telehealth (HOSPITAL_COMMUNITY): Payer: Self-pay

## 2018-08-30 NOTE — Telephone Encounter (Signed)
Pt insurance is active and benefits verified through Medicare A/B. Co-pay $0.00, DED $185.00/$185.00 met, out of pocket $0.00/$0.00 met, co-insurance 20%. No pre-authorization required. Passport, 08/30/18 @ 9:21AM, REF# [id number]

## 2018-08-30 NOTE — Telephone Encounter (Signed)
Called patient to see if she was interested in participating in the Cardiac Rehab Program. Patient stated yes. Patient will come in for orientation on 10/05/2018 @ 815AM and will attend the 945AM exercise class.  Mailed homework package.  Went over insurance, patient verbalized understanding.

## 2018-09-04 ENCOUNTER — Other Ambulatory Visit: Payer: Self-pay

## 2018-09-04 NOTE — Patient Outreach (Signed)
[facility]) Care Management  09/04/2018  [name] Feb 10, 1941 [id number]   EMMI-General discharge FOLLOW-UP ATTEMPT #1  RED ON [place] Day # 4 Date: 08/21/18 Red Alert Reason: "Lost interest in things? Yes"   Outreach attempt # 1 FOLLOW-CALL unsuccessful. HIPAA compliant voicemail left with contact information.    Plan: RN CM will make outreach attempt to patient within 3-4 business days. RN CM will send unsuccessful outreach letter to patient.     Barb Merino, RN,CCM [facility] Care Management Care Management Coordinator Direct Phone: [phone] Toll Free: [phone] Fax: [phone]

## 2018-09-05 ENCOUNTER — Other Ambulatory Visit: Payer: Self-pay

## 2018-09-05 NOTE — Patient Outreach (Signed)
[place] [facility]) Care Management  09/05/2018  Bonnie [name] [birth year] [id number]    EMMI-General discharge  Return call from patient, voice message received from patient stating she is returning my call. Patient states I may call her back on her cell phone or home phone number.    Plan: RN CM will attempt #2 outreach to patient to follow up on EMMI-General discharge Red Alert.    Barb Merino, RN,CCM [facility] Care Management Care Management Coordinator Direct Phone: [phone] Toll Free: [phone] Fax: [phone]

## 2018-09-05 NOTE — Patient Outreach (Signed)
[place] [facility]) Care Management  09/05/2018  Bonnie [name] 1941/04/20 [id number]    EMMI-General discharge FOLLOW-UP ATTEMPT #2  RED ON EMMI ALERT Day # 4 Date:08/21/18 Red Alert Reason: "Lost interest in things? Yes"   Outreach attempt # 2 successful.   Spoke with patient, HIPAA verified.  Follow-up: Patient is doing well and confirms having any/all services in place. She will turn her halter monitor in today. Patient will also participate in outpatient Cardiac Rehab. She and her spouse are both progressing toward wellness. Patient has completed collaboration with the [facility] pharmacist and denies having any questions or concerns at this time. RN CM reviewed who to call for new or worsening symptoms and the patient confirms having the phone numbers for her Cardiologist and PCP readily available. RN CM provided patient with the [facility] 24 hour nurse advice line phone number. Patient is appreciative of the call today.     Plan: RN CM will close case due to patient is stable and no further CM needs are identified at this time.     Barb Merino, RN,CCM [facility] Care Management Care Management Coordinator Direct Phone: [phone] Toll Free: [phone] Fax: [phone]

## 2018-09-07 ENCOUNTER — Emergency Department (HOSPITAL_COMMUNITY): Payer: Medicare Other

## 2018-09-07 ENCOUNTER — Emergency Department (HOSPITAL_COMMUNITY)
Admission: EM | Admit: 2018-09-07 | Discharge: 2018-09-08 | Disposition: A | Discharge status: Home / Self Care | Payer: Medicare Other | Attending: Emergency Medicine | Admitting: Emergency Medicine

## 2018-09-07 ENCOUNTER — Encounter (HOSPITAL_COMMUNITY): Payer: Self-pay | Admitting: Emergency Medicine

## 2018-09-07 DIAGNOSIS — Y9301 Activity, walking, marching and hiking: Secondary | ICD-10-CM | POA: Diagnosis not present

## 2018-09-07 DIAGNOSIS — I2510 Atherosclerotic heart disease of native coronary artery without angina pectoris: Secondary | ICD-10-CM | POA: Insufficient documentation

## 2018-09-07 DIAGNOSIS — R52 Pain, unspecified: Secondary | ICD-10-CM | POA: Diagnosis not present

## 2018-09-07 DIAGNOSIS — S42201A Unspecified fracture of upper end of right humerus, initial encounter for closed fracture: Secondary | ICD-10-CM | POA: Diagnosis not present

## 2018-09-07 DIAGNOSIS — Z79899 Other long term (current) drug therapy: Secondary | ICD-10-CM | POA: Insufficient documentation

## 2018-09-07 DIAGNOSIS — Z87891 Personal history of nicotine dependence: Secondary | ICD-10-CM | POA: Insufficient documentation

## 2018-09-07 DIAGNOSIS — M069 Rheumatoid arthritis, unspecified: Secondary | ICD-10-CM | POA: Insufficient documentation

## 2018-09-07 DIAGNOSIS — I252 Old myocardial infarction: Secondary | ICD-10-CM | POA: Insufficient documentation

## 2018-09-07 DIAGNOSIS — E039 Hypothyroidism, unspecified: Secondary | ICD-10-CM | POA: Insufficient documentation

## 2018-09-07 DIAGNOSIS — Y92009 Unspecified place in unspecified non-institutional (private) residence as the place of occurrence of the external cause: Secondary | ICD-10-CM | POA: Insufficient documentation

## 2018-09-07 DIAGNOSIS — R51 Headache: Secondary | ICD-10-CM | POA: Diagnosis not present

## 2018-09-07 DIAGNOSIS — S0990XA Unspecified injury of head, initial encounter: Secondary | ICD-10-CM | POA: Diagnosis not present

## 2018-09-07 DIAGNOSIS — W01190A Fall on same level from slipping, tripping and stumbling with subsequent striking against furniture, initial encounter: Secondary | ICD-10-CM | POA: Diagnosis not present

## 2018-09-07 DIAGNOSIS — S42291A Other displaced fracture of upper end of right humerus, initial encounter for closed fracture: Secondary | ICD-10-CM | POA: Insufficient documentation

## 2018-09-07 DIAGNOSIS — R000 Tachycardia, unspecified: Secondary | ICD-10-CM | POA: Diagnosis not present

## 2018-09-07 DIAGNOSIS — W19XXXA Unspecified fall, initial encounter: Secondary | ICD-10-CM

## 2018-09-07 DIAGNOSIS — S0181XA Laceration without foreign body of other part of head, initial encounter: Secondary | ICD-10-CM

## 2018-09-07 DIAGNOSIS — Y999 Unspecified external cause status: Secondary | ICD-10-CM | POA: Insufficient documentation

## 2018-09-07 DIAGNOSIS — R42 Dizziness and giddiness: Secondary | ICD-10-CM | POA: Diagnosis not present

## 2018-09-07 DIAGNOSIS — R58 Hemorrhage, not elsewhere classified: Secondary | ICD-10-CM | POA: Diagnosis not present

## 2018-09-07 DIAGNOSIS — S199XXA Unspecified injury of neck, initial encounter: Secondary | ICD-10-CM | POA: Diagnosis not present

## 2018-09-07 MED ORDER — LIDOCAINE-EPINEPHRINE (PF) 2 %-1:200000 IJ SOLN
10.0000 mL | Freq: Once | INTRAMUSCULAR | Status: AC
  Administered 2018-09-07: 10 mL via INTRADERMAL
  Filled 2018-09-07: qty 20

## 2018-09-07 NOTE — ED Notes (Signed)
Returned from CT scan on LSB  , C- collar intact , alert and oriented , reports right shoulder pain and occipital headache.

## 2018-09-07 NOTE — ED Triage Notes (Addendum)
Patient arrived with EMS from home on LSB/C- collar fell this evening at hit her head against a stool and floor , no LOC , alert and oriented , presents with right shoulder joint pain and occipital headache , laceration at right forehead dressed by EMS with moderate bleeding . She is taking Brillinta .

## 2018-09-07 NOTE — ED Notes (Signed)
Patient transported to CT scan . 

## 2018-09-07 NOTE — ED Provider Notes (Signed)
[facility] EMERGENCY DEPARTMENT Provider Note   CSN: [id number] Arrival date & time: 09/07/18  2208     History   Chief Complaint Chief Complaint  Patient presents with  . Fall    HPI Bonnie [name] is a 77 y.o. female.  HPI 77 year old female with past medical history as below here with fall.  The patient states that she got her foot caught on a nearby stool today, while walking her house.  She was carrying 2 glasses of water at the time.  She was unable to brace herself.  She struck her right head on a stool, then landed on her right arm.  She experienced acute onset of severe right arm pain.  She do not lose consciousness.  She now complains of a moderate, aching, throbbing, posterior headache as well as severe, sharp, stabbing right arm pain that is worse with any kind of movement.  No distal numbness or weakness.  She also sustained an open wound to her right temple area.  She is on Brilinta and has been taking as prescribed.  She strongly denies any preceding or subsequent chest pain, shortness of breath, lightheadedness, or other symptoms.  Past Medical History:  Diagnosis Date  . Beta thalassemia, heterozygous    ?alpha? pt does not recall.  Marland Kitchen CAD (coronary artery disease) 07/2018   NSTEMI (small vessel occlusion, not amenable to intervention)-->2V CAD on cath, preserved LV systolic fxn.  . Diastolic dysfunction 40/9811  . Fatigue   . GERD (gastroesophageal reflux disease)   . Hemorrhoids   . History of adenomatous polyp of colon   . Hyperlipidemia   . 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  Surgical path NEG for atypica or malignancy.  . NSTEMI (non-ST elevated myocardial infarction) ([place]) 07/2018  . Rheumatoid arthritis ([place]) 2007   muscle weaknes at times (Dr. Amil Amen)    Patient  Active Problem List   Diagnosis Date Noted  . Mitral regurgitation 08/09/2018  . Coronary artery disease 08/09/2018  . NSTEMI (non-ST elevated myocardial infarction) ([place]) 08/07/2018  . Porokeratosis 01/24/2015  . Hammer toe of left foot 01/24/2015  . Dermatitis 04/25/2013  . Abscess 05/18/2012  . Multinodular goiter (nontoxic) 04/27/2011  . LOC OSTEOARTHROS NOT SPEC WHETHER PRIM/SEC HAND 04/07/2009  . CERVICAL SPASM 02/27/2009  . FOOT PAIN, CHRONIC 11/07/2008  . UNS ADVRS EFF UNS RX MEDICINAL&BIOLOGICAL SBSTNC 06/27/2008  . ALLERGIC RHINITIS 12/26/2007  . THALASSEMIA NEC 07/10/2007  . DIVERTICULOSIS, COLON 07/10/2007  . COLONIC POLYPS, HX OF 07/10/2007  . Hyperlipidemia 05/12/2007  . RHEUMATOID ARTHRITIS 05/12/2007  . OSTEOPOROSIS 05/12/2007  . OSTEOPENIA 05/12/2007  . PALPITATIONS 05/12/2007    Past Surgical History:  Procedure Laterality Date  . ABDOMINAL HYSTERECTOMY  1987   DUB, no malignancy.  Ovaries still in.  Marland Kitchen BLADDER REPAIR    . CHOLECYSTECTOMY  1969  . COLONOSCOPY  11/07/2017   2014 adenoma.  Rpt 10/2017 adenoma x 1.  No further colonoscopies needed per GI.   Marland Kitchen LEFT HEART CATH AND CORONARY ANGIOGRAPHY N/A 08/08/2018   Procedure: LEFT HEART CATH AND CORONARY ANGIOGRAPHY;  Surgeon: Troy Sine, MD;  Location: [facility];  Service: Cardiovascular;  Laterality: N/A;  . THYROIDECTOMY  2012   cytologic atypia on nodule bx; surgical path NEG for atypica or malignancy.  . TRANSTHORACIC ECHOCARDIOGRAM  08/07/2018   EF  60-65%, no wall motion abnl, grd II DD, mod MR.  Marland Kitchen UPPER GASTROINTESTINAL ENDOSCOPY    . VAGINAL PROLAPSE REPAIR  2012     OB History   No obstetric history on file.      Home Medications    Prior to Admission medications   Medication Sig Start Date End Date Taking? Authorizing Provider  acetaminophen (TYLENOL) 500 MG tablet Take 500 mg by mouth every 6 (six) hours as needed for headache (pain).   Yes [provider]  aspirin  EC 81 MG tablet Take 81 mg by mouth at bedtime.   Yes [provider]  Calcium Citrate-Vitamin D (CALCIUM CITRATE + D PO) Take 1 tablet by mouth 2 (two) times daily.    Yes [provider]  Cholecalciferol (VITAMIN D3) 125 MCG (5000 UT) CAPS Take 5,000 Units by mouth daily.    Yes [provider]  Cyanocobalamin (B-12) 500 MCG TABS Take 500 mcg by mouth daily.    Yes [provider]  folic acid (FOLVITE) 1 MG tablet Take 1 mg by mouth 2 (two) times daily.    Yes [provider]  isosorbide mononitrate (IMDUR) 60 MG 24 hr tablet Take 1 tablet (60 mg total) by mouth daily. 08/10/18  Yes Kroeger, Daleen Snook M., PA-C  levothyroxine (SYNTHROID) 100 MCG tablet Take 100 mcg by mouth daily before breakfast.    Yes [provider]  LORazepam (ATIVAN) 0.5 MG tablet 1-2 tabs po bid prn anxiety Patient taking differently: Take 0.5 mg by mouth 2 (two) times daily as needed for anxiety.  08/18/18  Yes McGowen, Adrian Blackwater, MD  Magnesium 250 MG TABS Take 250 mg by mouth daily.    Yes [provider]  methotrexate 2.5 MG tablet Take 12.5 mg by mouth every Sunday.    Yes [provider]  Multiple Vitamin (MULTIVITAMIN WITH MINERALS) TABS tablet Take 1 tablet by mouth daily.   Yes [provider]  nitroGLYCERIN (NITROSTAT) 0.4 MG SL tablet Place 1 tablet (0.4 mg total) under the tongue every 5 (five) minutes x 3 doses as needed for chest pain. 08/08/18  Yes Kroeger, Daleen Snook M., PA-C  omega-3 acid ethyl esters (LOVAZA) 1 g capsule Take 2 capsules (2 g total) by mouth daily. 08/09/18  Yes Kroeger, Daleen Snook M., PA-C  potassium chloride (KLOR-CON) 8 MEQ tablet Take 2 tablets (16 mEq total) by mouth daily. Patient taking differently: Take 16 mEq by mouth 2 (two) times daily.  08/15/18  Yes Lendon Colonel, NP  rosuvastatin (CRESTOR) 40 MG tablet Take 1 tablet (40 mg total) by mouth daily at 6 PM. 08/08/18  Yes Kroeger, Lorelee Cover., PA-C  ticagrelor  (BRILINTA) 90 MG TABS tablet Take 1 tablet (90 mg total) by mouth 2 (two) times daily. 08/08/18  Yes Kroeger, Daleen Snook M., PA-C  docusate sodium (COLACE) 100 MG capsule Take 2 capsules (200 mg total) by mouth daily for 10 days. 09/08/18 09/18/18  Duffy Bruce, MD  HYDROcodone-acetaminophen (NORCO/VICODIN) 5-325 MG tablet Take 1-2 tablets by mouth every 4 (four) hours as needed for moderate pain or severe pain. 09/08/18   Duffy Bruce, MD  ondansetron (ZOFRAN ODT) 4 MG disintegrating tablet Take 1 tablet (4 mg total) by mouth every 8 (eight) hours as needed for nausea or vomiting. 09/08/18   Duffy Bruce, MD    Family History Family History  Problem Relation Age of Onset  . Hypertension Mother   . CVA Mother   . Colon cancer Neg Hx   .  Esophageal cancer Neg Hx   . Stomach cancer Neg Hx   . Rectal cancer Neg Hx     Social History Social History   Tobacco Use  . Smoking status: Former Smoker    Packs/day: 0.50    Years: 10.00    Pack years: 5.00    Types: Cigarettes    Last attempt to quit: 11/19/1973    Years since quitting: 44.8  . Smokeless tobacco: Never Used  Substance Use Topics  . Alcohol use: Yes    Alcohol/week: 0.0 standard drinks    Comment: very rare glass of wine  . Drug use: No     Allergies   Neosporin [neomycin-bacitracin zn-polymyx]; Seasonal ic [cholestatin]; Triamcinolone; Atorvastatin; Monascus purpureus went yeast; and Pravastatin   Review of Systems Review of Systems  Constitutional: Negative for chills and fever.  HENT: Negative for congestion, rhinorrhea and sore throat.   Eyes: Negative for visual disturbance.  Respiratory: Negative for cough, shortness of breath and wheezing.   Cardiovascular: Negative for chest pain and leg swelling.  Gastrointestinal: Negative for abdominal pain, diarrhea, nausea and vomiting.  Genitourinary: Negative for dysuria, flank pain, vaginal bleeding and vaginal discharge.  Musculoskeletal: Positive for  arthralgias. Negative for neck pain.  Skin: Positive for wound. Negative for rash.  Allergic/Immunologic: Negative for immunocompromised state.  Neurological: Positive for headaches. Negative for syncope.  Hematological: Does not bruise/bleed easily.  All other systems reviewed and are negative.    Physical Exam Updated Vital Signs BP (!) 118/52   Pulse (!) 47   Temp (!) 97.5 F (36.4 C) (Oral)   Resp 13   Ht 5\' 7"  (1.702 m)   Wt 71.2 kg   SpO2 100%   BMI 24.59 kg/m   Physical Exam Vitals signs and nursing note reviewed.  Constitutional:      General: She is not in acute distress.    Appearance: She is well-developed.  HENT:     Head: Normocephalic and atraumatic.     Comments: Approximately 2 cm, irregular, stellate laceration to the right frontal scalp.  There is a 1.5 cm superficial linear laceration just lateral to this.  Moderate surrounding edema.  Moderate amount of dark red bleeding.  No pulsatile bleeding noted. Eyes:     Conjunctiva/sclera: Conjunctivae normal.  Neck:     Musculoskeletal: Neck supple.     Comments: No midline or paraspinal tenderness Cardiovascular:     Rate and Rhythm: Normal rate and regular rhythm.     Heart sounds: Normal heart sounds. No murmur. No friction rub.  Pulmonary:     Effort: Pulmonary effort is normal. No respiratory distress.     Breath sounds: Normal breath sounds. No wheezing or rales.  Abdominal:     General: There is no distension.     Palpations: Abdomen is soft.     Tenderness: There is no abdominal tenderness.  Skin:    General: Skin is warm.     Capillary Refill: Capillary refill takes less than 2 seconds.  Neurological:     Mental Status: She is alert and oriented to person, place, and time.     Motor: No abnormal muscle tone.     UPPER EXTREMITY EXAM: RIGHT  INSPECTION & PALPATION: Marked TTP over R humeral head. TTP with any pROM. No open wounds.  SENSORY: Sensation is intact to light touch in:    Superficial radial nerve distribution (dorsal first web space) Median nerve distribution (tip of index finger)   Ulnar nerve distribution (  tip of small finger)     MOTOR:  + Motor posterior interosseous nerve (thumb IP extension) + Anterior interosseous nerve (thumb IP flexion, index finger DIP flexion) + Radial nerve (wrist extension) + Median nerve (palpable firing thenar mass) + Ulnar nerve (palpable firing of first dorsal interosseous muscle)  VASCULAR: 2+ radial pulse Brisk capillary refill < 2 sec, fingers warm and well-perfused   ED Treatments / Results  Labs (all labs ordered are listed, but only abnormal results are displayed) Labs Reviewed  I-STAT CHEM 8, ED - Abnormal; Notable for the following components:      Result Value   Glucose, Bld 142 (*)    Calcium, Ion 1.10 (*)    TCO2 21 (*)    Hemoglobin 10.9 (*)    HCT 32.0 (*)    All other components within normal limits  I-STAT TROPONIN, ED    EKG EKG Interpretation  Date/Time:  Friday September 08 2018 06:08:44 EST Ventricular Rate:  53 PR Interval:    QRS Duration: 105 QT Interval:  510 QTC Calculation: 479 R Axis:   -11 Text Interpretation:  Sinus rhythm Posterior infarct, old Abnormal T, consider ischemia, diffuse leads No significant change since last tracing Confirmed by Duffy Bruce [phone]) on 09/08/2018 6:41:10 AM Also confirmed by Duffy Bruce [phone]), editor Philomena Doheny [phone])  on 09/08/2018 7:30:18 AM   Radiology Dg Shoulder Right  Result Date: 09/08/2018 CLINICAL DATA:  Recent fall with right shoulder pain, initial encounter EXAM: RIGHT SHOULDER - 2+ VIEW COMPARISON:  None. FINDINGS: Mildly impacted fracture at the surgical neck is noted. Extension of the fracture line into humeral head is noted with mild separation of the greater tuberosity. No dislocation is seen. No other focal abnormality is noted. IMPRESSION: Comminuted right proximal humeral fracture. Electronically Signed   By:  Inez Catalina M.D.   On: 09/08/2018 01:01   Ct Head Wo Contrast  Result Date: 09/07/2018 CLINICAL DATA:  Status post fall, hitting head against stool and floor. Occipital headache and forehead laceration. Concern for cervical spine injury. Initial encounter. EXAM: CT HEAD WITHOUT CONTRAST CT CERVICAL SPINE WITHOUT CONTRAST TECHNIQUE: Multidetector CT imaging of the head and cervical spine was performed following the standard protocol without intravenous contrast. Multiplanar CT image reconstructions of the cervical spine were also generated. COMPARISON:  MRI of the brain performed 04/04/2012, and MRI of the cervical spine performed 11/25/2005 FINDINGS: CT HEAD FINDINGS Brain: No evidence of acute infarction, hemorrhage, hydrocephalus, extra-axial collection or mass lesion / mass effect. Prominence of the ventricles and sulci reflects mild to moderate cortical volume loss. Mild cerebellar atrophy is noted. The brainstem and fourth ventricle are within normal limits. The basal ganglia are unremarkable in appearance. The cerebral hemispheres demonstrate grossly normal gray-white differentiation. No mass effect or midline shift is seen. Vascular: No hyperdense vessel or unexpected calcification. Skull: There is no evidence of fracture; visualized osseous structures are unremarkable in appearance. Sinuses/Orbits: The orbits are within normal limits. The paranasal sinuses and mastoid air cells are well-aerated. Other: A soft tissue laceration is noted overlying the right frontal calvarium. CT CERVICAL SPINE FINDINGS Alignment: Normal. Skull base and vertebrae: No acute fracture. No primary bone lesion or focal pathologic process. Soft tissues and spinal canal: No prevertebral fluid or swelling. No visible canal hematoma. Disc levels: Intervertebral disc space narrowing is noted at C5-C6 and C6-C7, with scattered anterior and posterior disc osteophyte complexes. Mild degenerative change is noted about the dens. Upper  chest: The patient is status  post thyroidectomy. The visualized lung apices are grossly clear. Other: No additional soft tissue abnormalities are seen. IMPRESSION: 1. No evidence of traumatic intracranial injury or fracture. 2. No evidence of fracture or subluxation along the cervical spine. 3. Soft tissue laceration overlying the right frontal calvarium. 4. Mild to moderate cortical volume loss noted. 5. Mild degenerative change at the lower cervical spine. Electronically Signed   By: Garald Balding M.D.   On: 09/07/2018 23:37   Ct Cervical Spine Wo Contrast  Result Date: 09/07/2018 CLINICAL DATA:  Status post fall, hitting head against stool and floor. Occipital headache and forehead laceration. Concern for cervical spine injury. Initial encounter. EXAM: CT HEAD WITHOUT CONTRAST CT CERVICAL SPINE WITHOUT CONTRAST TECHNIQUE: Multidetector CT imaging of the head and cervical spine was performed following the standard protocol without intravenous contrast. Multiplanar CT image reconstructions of the cervical spine were also generated. COMPARISON:  MRI of the brain performed 04/04/2012, and MRI of the cervical spine performed 11/25/2005 FINDINGS: CT HEAD FINDINGS Brain: No evidence of acute infarction, hemorrhage, hydrocephalus, extra-axial collection or mass lesion / mass effect. Prominence of the ventricles and sulci reflects mild to moderate cortical volume loss. Mild cerebellar atrophy is noted. The brainstem and fourth ventricle are within normal limits. The basal ganglia are unremarkable in appearance. The cerebral hemispheres demonstrate grossly normal gray-white differentiation. No mass effect or midline shift is seen. Vascular: No hyperdense vessel or unexpected calcification. Skull: There is no evidence of fracture; visualized osseous structures are unremarkable in appearance. Sinuses/Orbits: The orbits are within normal limits. The paranasal sinuses and mastoid air cells are well-aerated. Other: A soft  tissue laceration is noted overlying the right frontal calvarium. CT CERVICAL SPINE FINDINGS Alignment: Normal. Skull base and vertebrae: No acute fracture. No primary bone lesion or focal pathologic process. Soft tissues and spinal canal: No prevertebral fluid or swelling. No visible canal hematoma. Disc levels: Intervertebral disc space narrowing is noted at C5-C6 and C6-C7, with scattered anterior and posterior disc osteophyte complexes. Mild degenerative change is noted about the dens. Upper chest: The patient is status post thyroidectomy. The visualized lung apices are grossly clear. Other: No additional soft tissue abnormalities are seen. IMPRESSION: 1. No evidence of traumatic intracranial injury or fracture. 2. No evidence of fracture or subluxation along the cervical spine. 3. Soft tissue laceration overlying the right frontal calvarium. 4. Mild to moderate cortical volume loss noted. 5. Mild degenerative change at the lower cervical spine. Electronically Signed   By: Garald Balding M.D.   On: 09/07/2018 23:37    Procedures .Marland KitchenLaceration Repair Date/Time: 09/08/2018 2:15 AM Performed by: Duffy Bruce, MD Authorized by: Duffy Bruce, MD   Consent:    Consent obtained:  Verbal   Consent given by:  Patient   Risks discussed:  Infection, need for additional repair, pain, tendon damage, retained foreign body, vascular damage, poor cosmetic result, poor wound healing and nerve damage   Alternatives discussed:  Referral and delayed treatment Anesthesia (see MAR for exact dosages):    Anesthesia method:  Local infiltration   Local anesthetic:  Lidocaine 1% WITH epi Laceration details:    Location: Forehead.   Length (cm):  2.5 Repair type:    Repair type:  Simple Pre-procedure details:    Preparation:  Patient was prepped and draped in usual sterile fashion and imaging obtained to evaluate for foreign bodies Exploration:    Hemostasis achieved with:  Direct pressure   Wound  exploration: wound explored through full range of motion  and entire depth of wound probed and visualized     Wound extent: no tendon damage noted and no underlying fracture noted   Treatment:    Area cleansed with:  Betadine   Amount of cleaning:  Extensive   Irrigation solution:  Sterile water   Irrigation method:  Pressure wash Skin repair:    Repair method:  Sutures   Suture size:  5-0   Suture material:  Fast-absorbing gut   Suture technique:  Simple interrupted   Number of sutures:  5 Approximation:    Approximation:  Close Post-procedure details:    Dressing:  Antibiotic ointment   Patient tolerance of procedure:  Tolerated well, no immediate complications .Marland KitchenLaceration Repair Date/Time: 09/08/2018 2:16 AM Performed by: Duffy Bruce, MD Authorized by: Duffy Bruce, MD   Consent:    Consent obtained:  Verbal   Consent given by:  Patient   Risks discussed:  Infection, need for additional repair, pain, tendon damage, retained foreign body, vascular damage, poor cosmetic result, poor wound healing and nerve damage   Alternatives discussed:  Referral and delayed treatment Anesthesia (see MAR for exact dosages):    Anesthesia method:  Local infiltration   Local anesthetic:  Lidocaine 1% WITH epi Laceration details:    Location: Right forehead.   Length (cm):  1.5 Repair type:    Repair type:  Simple Pre-procedure details:    Preparation:  Patient was prepped and draped in usual sterile fashion and imaging obtained to evaluate for foreign bodies Exploration:    Hemostasis achieved with:  Direct pressure   Wound exploration: wound explored through full range of motion and entire depth of wound probed and visualized   Treatment:    Area cleansed with:  Betadine   Amount of cleaning:  Extensive   Irrigation solution:  Sterile water   Irrigation method:  Pressure wash Skin repair:    Repair method:  Sutures   Suture size:  5-0   Suture material:  Fast-absorbing gut    Suture technique:  Simple interrupted   Number of sutures:  4 Approximation:    Approximation:  Close Post-procedure details:    Dressing:  Antibiotic ointment   Patient tolerance of procedure:  Tolerated well, no immediate complications   (including critical care time)  Medications Ordered in ED Medications  sodium chloride 0.9 % bolus 1,000 mL (has no administration in time range)  lidocaine-EPINEPHrine (XYLOCAINE W/EPI) 2 %-1:200000 (PF) injection 10 mL (10 mLs Intradermal Given 09/07/18 2322)  morphine 2 MG/ML injection 2 mg (2 mg Intravenous Given 09/08/18 0017)  acetaminophen (TYLENOL) tablet 1,000 mg (1,000 mg Oral Given 09/08/18 0017)  sodium chloride 0.9 % bolus 1,000 mL (0 mLs Intravenous Stopped 09/08/18 0427)  ondansetron (ZOFRAN-ODT) disintegrating tablet 4 mg (4 mg Oral Given 09/08/18 0601)     Initial Impression / Assessment and Plan / ED Course  I have reviewed the triage vital signs and the nursing notes.  Pertinent labs & imaging results that were available during my care of the patient were reviewed by me and considered in my medical decision making (see chart for details).     77 year old female here with head injury and right arm pain after mechanical fall.  No loss of consciousness.  Regarding her head wound, CT scans are negative.  She denies any neurological symptoms.  Lacerations were cleaned and repaired as above, and C-spine cleared clinically in addition to negative imaging.  No signs of central cord syndrome.  Risk of delayed bleed discussed  and will discharge home from this perspective.  Regarding her arm pain, she has a right humeral head fracture.  She is neurovascularly intact distally.  Discussed this with Dr. Stann Mainland of orthopedics, will discharge with outpatient follow-up in 1 to 3 weeks.  Otherwise, patient is well in her baseline.  She has family who can help around the house.  Of note, while waiting to walk to be discharged, pt stood up from the  toilet and got very lightheaded. She felt flushed. She felt like she was going to lose consciousness and briefly did, lowered to ground and felt better. I suspect this is orthostasis 2/2 poor PO intake overnight in ED, pain meds, and possible mild concussion. Will have her hydrate, PO, and re-attempt. EKG repeated - she has old inferior TWI and lateral TWI which have been documented previously in EKGs and Cardiology notes. I also added on a trop, chemistry which shows no acute abnormalities. CO2 mildly decreased likely 2/2 dehydration. Will attempt hydration, PO challenge.  If pt remains unable to ambulate 2/2 orthostasis/dizziness, would consider admission for serial exams, hydration, and observation in setting of concussion. If she develops HA or new neuro deficits, would need repeat CT though neg initially.    Final Clinical Impressions(s) / ED Diagnoses   Final diagnoses:  Laceration of forehead, initial encounter  Fall, initial encounter  Humerus head fracture, right, closed, initial encounter    ED Discharge Orders         Ordered    HYDROcodone-acetaminophen (NORCO/VICODIN) 5-325 MG tablet  Every 4 hours PRN     09/08/18 0303    ondansetron (ZOFRAN ODT) 4 MG disintegrating tablet  Every 8 hours PRN     09/08/18 0303    docusate sodium (COLACE) 100 MG capsule  Daily     09/08/18 0303           Duffy Bruce, MD 09/08/18 [phone]

## 2018-09-08 ENCOUNTER — Emergency Department (HOSPITAL_COMMUNITY): Payer: Medicare Other

## 2018-09-08 DIAGNOSIS — S42201A Unspecified fracture of upper end of right humerus, initial encounter for closed fracture: Secondary | ICD-10-CM | POA: Diagnosis not present

## 2018-09-08 LAB — I-STAT CHEM 8, ED
BUN: 16 mg/dL (ref 8–23)
CALCIUM ION: 1.1 mmol/L — AB (ref 1.15–1.40)
Chloride: 109 mmol/L (ref 98–111)
Creatinine, Ser: 0.7 mg/dL (ref 0.44–1.00)
Glucose, Bld: 142 mg/dL — ABNORMAL HIGH (ref 70–99)
HCT: 32 % — ABNORMAL LOW (ref 36.0–46.0)
Hemoglobin: 10.9 g/dL — ABNORMAL LOW (ref 12.0–15.0)
Potassium: 3.6 mmol/L (ref 3.5–5.1)
Sodium: 139 mmol/L (ref 135–145)
TCO2: 21 mmol/L — ABNORMAL LOW (ref 22–32)

## 2018-09-08 LAB — I-STAT TROPONIN, ED: Troponin i, poc: 0.01 ng/mL (ref 0.00–0.08)

## 2018-09-08 MED ORDER — ONDANSETRON 4 MG PO TBDP: 4.0000 mg | ORAL_TABLET | Freq: Three times a day (TID) | ORAL | 0 refills | Status: DC | PRN

## 2018-09-08 MED ORDER — HYDROCODONE-ACETAMINOPHEN 5-325 MG PO TABS: 1.0000 | ORAL_TABLET | ORAL | 0 refills | Status: DC | PRN

## 2018-09-08 MED ORDER — ACETAMINOPHEN 500 MG PO TABS
1000.0000 mg | ORAL_TABLET | Freq: Once | ORAL | Status: AC
  Administered 2018-09-08: 1000 mg via ORAL
  Filled 2018-09-08: qty 2

## 2018-09-08 MED ORDER — MORPHINE SULFATE (PF) 2 MG/ML IV SOLN
2.0000 mg | Freq: Once | INTRAVENOUS | Status: AC
  Administered 2018-09-08: 2 mg via INTRAVENOUS
  Filled 2018-09-08: qty 1

## 2018-09-08 MED ORDER — DOCUSATE SODIUM 100 MG PO CAPS: 200.0000 mg | ORAL_CAPSULE | Freq: Every day | ORAL | 0 refills | Status: AC

## 2018-09-08 MED ORDER — SODIUM CHLORIDE 0.9 % IV BOLUS
1000.0000 mL | Freq: Once | INTRAVENOUS | Status: AC
  Administered 2018-09-08: 1000 mL via INTRAVENOUS

## 2018-09-08 MED ORDER — SODIUM CHLORIDE 0.9 % IV BOLUS: 1000.0000 mL | Freq: Once | INTRAVENOUS | Status: DC

## 2018-09-08 MED ORDER — ONDANSETRON 4 MG PO TBDP
4.0000 mg | ORAL_TABLET | Freq: Once | ORAL | Status: AC
  Administered 2018-09-08: 4 mg via ORAL
  Filled 2018-09-08: qty 1

## 2018-09-08 NOTE — ED Notes (Signed)
Pt ambulated with 2 staff stand by.  HR increased from 50's to 80's.  BP remained in low 100's.  PT stated some dizziness, but was able to tolerate.

## 2018-09-08 NOTE — ED Notes (Signed)
Patient discharged and stopped by bathroom in waiting room, where she got dizzy when she got up from the toilet.  Her son caught her, she did not fall, but she was quite dizzy and nauseated.  New orders per Dr Ellender Hose.

## 2018-09-08 NOTE — ED Notes (Addendum)
Provider bedside, ordered Zofran, and ordered drink and crackers.  We will need to watch pt longer.

## 2018-09-08 NOTE — ED Notes (Signed)
Patient  unable to ambulate , she felt dizzy and nauseous while sitting on bed .

## 2018-09-08 NOTE — Discharge Instructions (Addendum)
For your lacerations:  Apply bacitracin/topical antibiotic ointment two to three times daily until healed It is OK to shower, but do not bathe or submerge your head underwater Do not scrub the area aggressively when cleaning. Blot dry. Cover with a bandaid  The sutures should dissolve on their own  For your fracture:  Wear your sling at all times Do not lift ANYTHING with the arm until cleared by orthopedics I've discussed your case with Dr. Stann Mainland of Orthopedics. Call for appt in 1-2 weeks. Take the pain meds as needed.

## 2018-09-08 NOTE — ED Provider Notes (Signed)
3:17 PM Assumed care from Dr. Ellender Hose, please see their note for full history, physical and decision making until this point. In brief this is a 77 y.o. year old female who presented to the ED tonight with Fall     Tolerating PO. Ambulating with mild dizziness but no syncope. Stable for dc w/ concussion precautions.   Discharge instructions, including strict return precautions for new or worsening symptoms, given. Patient and/or family verbalized understanding and agreement with the plan as described.   Labs, studies and imaging reviewed by myself and considered in medical decision making if ordered. Imaging interpreted by radiology.  Labs Reviewed  I-STAT CHEM 8, ED - Abnormal; Notable for the following components:      Result Value   Glucose, Bld 142 (*)    Calcium, Ion 1.10 (*)    TCO2 21 (*)    Hemoglobin 10.9 (*)    HCT 32.0 (*)    All other components within normal limits  I-STAT TROPONIN, ED    DG Shoulder Right  Final Result    CT Head Wo Contrast  Final Result    CT Cervical Spine Wo Contrast  Final Result      No follow-ups on file.    Merrily Pew, MD 09/08/18 [phone]

## 2018-09-08 NOTE — ED Notes (Signed)
Pt ambulated without assistance in hallway, reported lightheadedness while ambulating but wasw able to tolerate the activity

## 2018-09-08 NOTE — Progress Notes (Signed)
Patient not longer needs IV, ED nurse was able to obtain

## 2018-09-08 NOTE — ED Notes (Signed)
Two RNs attempted to start IV, unsuccessful.

## 2018-09-08 NOTE — ED Notes (Signed)
EDP removed LSB and C- collar.

## 2018-09-08 NOTE — ED Notes (Signed)
Pt given crackers, apple sauce, ginger ale.

## 2018-09-08 NOTE — ED Notes (Signed)
Pt will call when finished eating and ready to attempt orthostatics

## 2018-09-12 ENCOUNTER — Telehealth (HOSPITAL_COMMUNITY): Payer: Self-pay

## 2018-09-14 ENCOUNTER — Encounter: Payer: Self-pay | Admitting: Family Medicine

## 2018-09-14 ENCOUNTER — Ambulatory Visit (INDEPENDENT_AMBULATORY_CARE_PROVIDER_SITE_OTHER): Payer: Medicare Other | Admitting: Family Medicine

## 2018-09-14 ENCOUNTER — Encounter

## 2018-09-14 VITALS — BP 126/68 | HR 64 | Temp 97.8°F | Resp 16 | Ht 66.0 in | Wt 157.0 lb

## 2018-09-14 DIAGNOSIS — S060X1D Concussion with loss of consciousness of 30 minutes or less, subsequent encounter: Secondary | ICD-10-CM

## 2018-09-14 DIAGNOSIS — S42291D Other displaced fracture of upper end of right humerus, subsequent encounter for fracture with routine healing: Secondary | ICD-10-CM | POA: Diagnosis not present

## 2018-09-14 DIAGNOSIS — I2510 Atherosclerotic heart disease of native coronary artery without angina pectoris: Secondary | ICD-10-CM

## 2018-09-14 DIAGNOSIS — R002 Palpitations: Secondary | ICD-10-CM | POA: Diagnosis not present

## 2018-09-14 MED ORDER — HYDROCODONE-ACETAMINOPHEN 5-325 MG PO TABS: 1.0000 | ORAL_TABLET | ORAL | 0 refills | Status: DC | PRN

## 2018-09-14 NOTE — Progress Notes (Signed)
OFFICE VISIT  09/14/2018   CC:  Chief Complaint  Patient presents with  . Follow-up    from [place] visit in December    HPI:    Patient is a 78 y.o. Caucasian female who presents for f/u recent ED visit 09/07/18 and f/u relatively recent NSTEMI/CAD and palpitations.    CAD: no chest pain, no DOE, no chest pressure.  She is compliant with her meds.  Palpitations: she denies having any palpitations recently, but is getting set up for event monitor as well as cardiac f/u.   FALL/ED visit--> ED visit 09/07/18 for fall:  reviewed records in detail today. She sustained a fall when her foot caught on a stool when walking. She had a glass of water in each hand and was unable to catch herself. Hit head on stool, R shoulder on ground.  + LOC for a few seconds. Imaging of head-->neg acute.  Small R forehead lac was sutured with absorbable sutures.  Ultimately she was dx'd with concussion and right humeral head fx. Still with some feeling off balance, some HA in R frontal region where the trauma occurred, no vision or hearing c/o.  No focal weakness, swallowing problems, or speech impairment. No new memory impairment.   Some vicodin use but now reverted back to tylenol only and she is back in a lot of pain (mainly R shoulder). Has appt with ortho 09/26/17.    Pertinent imaging:  R shoulder 09/08/18: FINDINGS: Mildly impacted fracture at the surgical neck is noted. Extension of the fracture line into humeral head is noted with mild separation of the greater tuberosity. No dislocation is seen. No other focal abnormality is noted.  IMPRESSION: Comminuted right proximal humeral fracture.  CT head and C spine w/out contrast on 09/07/18: IMPRESSION: 1. No evidence of traumatic intracranial injury or fracture. 2. No evidence of fracture or subluxation along the cervical spine. 3. Soft tissue laceration overlying the right frontal calvarium. 4. Mild to moderate cortical volume loss  noted. 5. Mild degenerative change at the lower cervical spine.  Past Medical History:  Diagnosis Date  . Beta thalassemia, heterozygous    ?alpha? pt does not recall.  Marland Kitchen CAD (coronary artery disease) 07/2018   NSTEMI (small vessel occlusion, not amenable to intervention)-->2V CAD on cath, preserved LV systolic fxn.  . Diastolic dysfunction 40/1027  . Fatigue   . GERD (gastroesophageal reflux disease)   . Hemorrhoids   . History of adenomatous polyp of colon   . Hyperlipidemia   . 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  Surgical path NEG for atypica or malignancy.  . NSTEMI (non-ST elevated myocardial infarction) ([place]) 07/2018  . Rheumatoid arthritis ([place]) 2007   muscle weaknes at times (Dr. Amil Amen)    Past Surgical History:  Procedure Laterality Date  . ABDOMINAL HYSTERECTOMY  1987   DUB, no malignancy.  Ovaries still in.  Marland Kitchen BLADDER REPAIR    . CHOLECYSTECTOMY  1969  . COLONOSCOPY  11/07/2017   2014 adenoma.  Rpt 10/2017 adenoma x 1.  No further colonoscopies needed per GI.   Marland Kitchen LEFT HEART CATH AND CORONARY ANGIOGRAPHY N/A 08/08/2018   Procedure: LEFT HEART CATH AND CORONARY ANGIOGRAPHY;  Surgeon: Troy Sine, MD;  Location: [facility];  Service: Cardiovascular;  Laterality: N/A;  . THYROIDECTOMY  2012   cytologic atypia on nodule bx; surgical  path NEG for atypica or malignancy.  . TRANSTHORACIC ECHOCARDIOGRAM  08/07/2018   EF 60-65%, no wall motion abnl, grd II DD, mod MR.  Marland Kitchen UPPER GASTROINTESTINAL ENDOSCOPY    . VAGINAL PROLAPSE REPAIR  2012    Outpatient Medications Prior to Visit  Medication Sig Dispense Refill  . acetaminophen (TYLENOL) 500 MG tablet Take 500 mg by mouth every 6 (six) hours as needed for headache (pain).    Marland Kitchen aspirin EC 81 MG tablet Take 81 mg by mouth at bedtime.    .  Calcium Citrate-Vitamin D (CALCIUM CITRATE + D PO) Take 1 tablet by mouth 2 (two) times daily.     . Cholecalciferol (VITAMIN D3) 125 MCG (5000 UT) CAPS Take 5,000 Units by mouth daily.     . Cyanocobalamin (B-12) 500 MCG TABS Take 500 mcg by mouth daily.     Marland Kitchen docusate sodium (COLACE) 100 MG capsule Take 2 capsules (200 mg total) by mouth daily for 10 days. 20 capsule 0  . folic acid (FOLVITE) 1 MG tablet Take 1 mg by mouth 2 (two) times daily.     . isosorbide mononitrate (IMDUR) 60 MG 24 hr tablet Take 1 tablet (60 mg total) by mouth daily. 30 tablet 3  . levothyroxine (SYNTHROID) 100 MCG tablet Take 100 mcg by mouth daily before breakfast.     . LORazepam (ATIVAN) 0.5 MG tablet 1-2 tabs po bid prn anxiety (Patient taking differently: Take 0.5 mg by mouth 2 (two) times daily as needed for anxiety. ) 60 tablet 1  . Magnesium 250 MG TABS Take 250 mg by mouth daily.     . methotrexate 2.5 MG tablet Take 12.5 mg by mouth every Sunday.     . Multiple Vitamin (MULTIVITAMIN WITH MINERALS) TABS tablet Take 1 tablet by mouth daily.    . nitroGLYCERIN (NITROSTAT) 0.4 MG SL tablet Place 1 tablet (0.4 mg total) under the tongue every 5 (five) minutes x 3 doses as needed for chest pain. 25 tablet 3  . omega-3 acid ethyl esters (LOVAZA) 1 g capsule Take 2 capsules (2 g total) by mouth daily. 90 capsule 3  . ondansetron (ZOFRAN ODT) 4 MG disintegrating tablet Take 1 tablet (4 mg total) by mouth every 8 (eight) hours as needed for nausea or vomiting. 20 tablet 0  . potassium chloride (KLOR-CON) 8 MEQ tablet Take 2 tablets (16 mEq total) by mouth daily. (Patient taking differently: Take 16 mEq by mouth 2 (two) times daily. ) 60 tablet 6  . rosuvastatin (CRESTOR) 40 MG tablet Take 1 tablet (40 mg total) by mouth daily at 6 PM. 90 tablet 3  . ticagrelor (BRILINTA) 90 MG TABS tablet Take 1 tablet (90 mg total) by mouth 2 (two) times daily. 60 tablet 0  . HYDROcodone-acetaminophen (NORCO/VICODIN) 5-325 MG tablet Take  1-2 tablets by mouth every 4 (four) hours as needed for moderate pain or severe pain. 20 tablet 0   No facility-administered medications prior to visit.     Allergies  Allergen Reactions  . Neosporin [Neomycin-Bacitracin Zn-Polymyx] Other (See Comments)    Patient states that wounds or scratches never heal. (pt uses bacitracin)   . Seasonal Ic [Cholestatin] Other (See Comments)    Leg cramps  . Triamcinolone Other (See Comments)    Burning sensation  . Atorvastatin Other (See Comments)    Leg cramps  . Monascus Purpureus Went Yeast Other (See Comments)    "Red Yeast Rice" causes leg cramps  . Pravastatin Other (See  Comments)    Leg cramps    ROS As per HPI  PE: Blood pressure 126/68, pulse 64, temperature 97.8 F (36.6 C), temperature source Oral, resp. rate 16, height 5\' 6"  (1.676 m), weight 157 lb (71.2 kg), SpO2 98 %. Gen: Alert, well appearing.  Patient is oriented to person, place, time, and situation. AFFECT: pleasant, lucid thought and speech. LDJ:TTSV: no injection, icteris, swelling, or exudate.  EOMI, PERRLA. Mouth: lips without lesion/swelling.  Oral mucosa pink and moist. Oropharynx without erythema, exudate, or swelling.  R forehead hairline with well approximated 3 cm lac, no erythema or exudate. CV: RRR, no m/r/g.   LUNGS: CTA bilat, nonlabored resps, good aeration in all lung fields. EXT: no clubbing or cyanosis.  no edema.  Neuro: CN 2-12 intact bilaterally, strength 5/5 in proximal and distal upper extremities and lower extremities bilaterally.  No tremor.  No disdiadochokinesis.  She walked a little slow and turned a little unsteadily.  She could not tandem walk more than 2 steps.  Upper extremity and lower extremity DTRs symmetric.  No pronator drift.   LABS:    Chemistry      Component Value Date/Time   NA 139 09/08/2018 0624   NA 141 08/29/2018 0000   K 3.6 09/08/2018 0624   CL 109 09/08/2018 0624   CO2 20 08/29/2018 0000   BUN 16 09/08/2018 0624    BUN 18 08/29/2018 0000   CREATININE 0.70 09/08/2018 0624      Component Value Date/Time   CALCIUM 9.5 08/29/2018 0000   ALKPHOS 56 12/04/2012 1123   AST 25 12/04/2012 1123   ALT 30 12/04/2012 1123   BILITOT 0.7 12/04/2012 1123     Lab Results  Component Value Date   WBC 7.5 08/14/2018   HGB 10.9 (L) 09/08/2018   HCT 32.0 (L) 09/08/2018   MCV 95.5 08/14/2018   PLT 272 08/14/2018    IMPRESSION AND PLAN:  1) Concussion: still symptomatic, particularly with regard to balance and lightheadedness. Instructions: You may do light activity around your house and take a walk once a day with another person with you in case you need help with balance. Try not to watch TV, read, use a computer or cell phone, or listen to music more than an hour per day.  2) Right sided forehead laceration: healing well.  She can leave this uncovered now.  3) Right humeral head fx: wearing sling, still in signif pain, has ortho appt set. I rx'd vicodin 5/325, 1-2 q6h prn, #30 today.  4) CAD, recent NSTEMI-->asymptomatic, compliant with meds. Has f/u with Dr. Percival Spanish 10/09/17. I recommended she postpone cardiac rehab until she is completely over her concussion.  5) Palpitations: occurred mostly in the 1-2 days after being d/c'd home after her MI. Event monitor planned by cardiology.  An After Visit Summary was printed and given to the patient.  FOLLOW UP: Return in about 1 week (around 09/21/2018) for f/u concussion.  Signed:  Crissie Sickles, MD           09/14/2018

## 2018-09-14 NOTE — Patient Instructions (Signed)
You should let your right arm be in the position it is most comfortable in. You make leave your forehead wound uncovered now.  You may do light activity around your house and take a walk once a day with another person with you in case you need help with balance.  Try not to watch TV, read, use a computer or cell phone, or listen to music more than an hour per day.

## 2018-09-15 ENCOUNTER — Other Ambulatory Visit: Payer: Self-pay

## 2018-09-15 NOTE — Patient Outreach (Signed)
[facility]) Care Management  09/15/2018  Bonnie [name] 02/13/1941 [id number]   EMMI-FOLLOW UP CALL   Voice message received from patient requesting a return phone call.   Outreach attempt #1 successful, spoke with patient, HIPAA verified.   Patient is calling to ask for guidance on who to call to get assistance with a dressing change to her right wrist due to having a recent injury from a fall. Her PCP is aware, she visited his office yesterday but did not discuss needing assistance with a dressing change.   RN CM instructed patient to contact her PCP office and request to speak with the nurse to advise of her needs and they will further assist her. Patient verbalizes understanding and will call today. She is appreciative of the f/u call.   RN CM encouraged patient to call her PCP or to utilize the [facility] 24/7 nurse advice line for further health related non-urgent questions and she verbalizes understanding.    Plan: RN CM will close case due to no further needs are identified.    Barb Merino, RN,CCM [facility] Care Management Care Management Coordinator Direct Phone: [phone] Toll Free: [phone] Fax: [phone]

## 2018-09-21 ENCOUNTER — Encounter: Payer: Self-pay | Admitting: Family Medicine

## 2018-09-21 ENCOUNTER — Ambulatory Visit (INDEPENDENT_AMBULATORY_CARE_PROVIDER_SITE_OTHER): Payer: Medicare Other | Admitting: Family Medicine

## 2018-09-21 ENCOUNTER — Ambulatory Visit: Payer: Medicare Other | Admitting: Family Medicine

## 2018-09-21 ENCOUNTER — Encounter: Payer: Self-pay | Admitting: *Deleted

## 2018-09-21 VITALS — BP 126/64 | HR 55 | Temp 97.7°F | Resp 16 | Ht 66.0 in | Wt 158.5 lb

## 2018-09-21 DIAGNOSIS — I2510 Atherosclerotic heart disease of native coronary artery without angina pectoris: Secondary | ICD-10-CM | POA: Diagnosis not present

## 2018-09-21 DIAGNOSIS — R2681 Unsteadiness on feet: Secondary | ICD-10-CM

## 2018-09-21 DIAGNOSIS — S060X1D Concussion with loss of consciousness of 30 minutes or less, subsequent encounter: Secondary | ICD-10-CM

## 2018-09-21 NOTE — Progress Notes (Signed)
OFFICE VISIT  09/21/2018   CC:  Chief Complaint  Patient presents with  . Follow-up    concussion    HPI:    Patient is a 78 y.o. Caucasian female who presents accompanied by her husband and daughter today for 1 week f/u concussion. She fell 14 d/a-->concussion.  Here for 7 d f/u.  Interim hx: Still having mild lightheaded feeling present most of the time, gets briefly worse with standing up.  No falls. Feels off balance when walking.  No signif HA. No vision or hearing abnormalities. No probs with chewing, swallowing, or speaking.  No focal weakness. No longer having any short term memory problems.  Event monitor results, 08/22/18: NO ARRHYTHMIAS.  Dr. Percival Spanish read it/is aware.    Past Medical History:  Diagnosis Date  . Beta thalassemia, heterozygous    ?alpha? pt does not recall.  Marland Kitchen CAD (coronary artery disease) 07/2018   NSTEMI (small vessel occlusion, not amenable to intervention)-->2V CAD on cath, preserved LV systolic fxn.  . Diastolic dysfunction 51/7616  . Fatigue   . GERD (gastroesophageal reflux disease)   . Hemorrhoids   . History of adenomatous polyp of colon   . Hyperlipidemia   . 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  Surgical path NEG for atypica or malignancy.  . NSTEMI (non-ST elevated myocardial infarction) ([place]) 07/2018  . Rheumatoid arthritis ([place]) 2007   muscle weaknes at times (Dr. Amil Amen)    Past Surgical History:  Procedure Laterality Date  . ABDOMINAL HYSTERECTOMY  1987   DUB, no malignancy.  Ovaries still in.  Marland Kitchen BLADDER REPAIR    . Cardiac Event Monitor  08/22/2018   NO arrhythmias.  Her symptomatic periods coresponded to NSR.  Marland Kitchen CHOLECYSTECTOMY  1969  . COLONOSCOPY  11/07/2017   2014 adenoma.  Rpt 10/2017 adenoma x 1.  No further colonoscopies needed per GI.   Marland Kitchen LEFT  HEART CATH AND CORONARY ANGIOGRAPHY N/A 08/08/2018   Procedure: LEFT HEART CATH AND CORONARY ANGIOGRAPHY;  Surgeon: Troy Sine, MD;  Location: [place] [facility];  Service: Cardiovascular;  Laterality: N/A;  . THYROIDECTOMY  2012   cytologic atypia on nodule bx; surgical path NEG for atypica or malignancy.  . TRANSTHORACIC ECHOCARDIOGRAM  08/07/2018   EF 60-65%, no wall motion abnl, grd II DD, mod MR.  Marland Kitchen UPPER GASTROINTESTINAL ENDOSCOPY    . VAGINAL PROLAPSE REPAIR  2012    Outpatient Medications Prior to Visit  Medication Sig Dispense Refill  . acetaminophen (TYLENOL) 500 MG tablet Take 500 mg by mouth every 6 (six) hours as needed for headache (pain).    Marland Kitchen aspirin EC 81 MG tablet Take 81 mg by mouth at bedtime.    . Calcium Citrate-Vitamin D (CALCIUM CITRATE + D PO) Take 1 tablet by mouth 2 (two) times daily.     . Cholecalciferol (VITAMIN D3) 125 MCG (5000 UT) CAPS Take 5,000 Units by mouth daily.     . Cyanocobalamin (B-12) 500 MCG TABS Take 500 mcg by mouth daily.     . folic acid (FOLVITE) 1 MG tablet Take 1 mg by mouth 2 (two) times daily.     Marland Kitchen HYDROcodone-acetaminophen (NORCO/VICODIN) 5-325 MG tablet Take 1-2 tablets by mouth every 4 (four) hours as needed for moderate pain or severe pain. 30 tablet 0  . isosorbide mononitrate (IMDUR) 60 MG 24 hr  tablet Take 1 tablet (60 mg total) by mouth daily. 30 tablet 3  . levothyroxine (SYNTHROID) 100 MCG tablet Take 100 mcg by mouth daily before breakfast.     . LORazepam (ATIVAN) 0.5 MG tablet 1-2 tabs po bid prn anxiety (Patient taking differently: Take 0.5 mg by mouth 2 (two) times daily as needed for anxiety. ) 60 tablet 1  . Magnesium 250 MG TABS Take 250 mg by mouth daily.     . methotrexate 2.5 MG tablet Take 12.5 mg by mouth every Sunday.     . Multiple Vitamin (MULTIVITAMIN WITH MINERALS) TABS tablet Take 1 tablet by mouth daily.    . nitroGLYCERIN (NITROSTAT) 0.4 MG SL tablet Place 1 tablet (0.4 mg total) under the tongue every  5 (five) minutes x 3 doses as needed for chest pain. 25 tablet 3  . omega-3 acid ethyl esters (LOVAZA) 1 g capsule Take 2 capsules (2 g total) by mouth daily. 90 capsule 3  . ondansetron (ZOFRAN ODT) 4 MG disintegrating tablet Take 1 tablet (4 mg total) by mouth every 8 (eight) hours as needed for nausea or vomiting. 20 tablet 0  . potassium chloride (KLOR-CON) 8 MEQ tablet Take 2 tablets (16 mEq total) by mouth daily. (Patient taking differently: Take 16 mEq by mouth 2 (two) times daily. ) 60 tablet 6  . rosuvastatin (CRESTOR) 40 MG tablet Take 1 tablet (40 mg total) by mouth daily at 6 PM. 90 tablet 3  . ticagrelor (BRILINTA) 90 MG TABS tablet Take 1 tablet (90 mg total) by mouth 2 (two) times daily. 60 tablet 0   No facility-administered medications prior to visit.     Allergies  Allergen Reactions  . Neosporin [Neomycin-Bacitracin Zn-Polymyx] Other (See Comments)    Patient states that wounds or scratches never heal. (pt uses bacitracin)   . Seasonal Ic [Cholestatin] Other (See Comments)    Leg cramps  . Triamcinolone Other (See Comments)    Burning sensation  . Atorvastatin Other (See Comments)    Leg cramps  . Monascus Purpureus Went Yeast Other (See Comments)    "Red Yeast Rice" causes leg cramps  . Pravastatin Other (See Comments)    Leg cramps    ROS As per HPI  PE: Blood pressure 126/64, pulse (!) 55, temperature 97.7 F (36.5 C), temperature source Oral, resp. rate 16, height 5\' 6"  (1.676 m), weight 158 lb 8 oz (71.9 kg), SpO2 98 %. Gen: Alert, well appearing.  Patient is oriented to person, place, time, and situation. AFFECT: pleasant, lucid thought and speech. Neuro: CN 2-12 intact bilaterally, strength 5/5 in proximal and distal upper extremities and lower extremities bilaterally.  No sensory deficits.  No tremor.  No disdiadochokinesis.  No ataxia.  Upper extremity and lower extremity DTRs symmetric.  No pronator drift. She can tandem walk 6-8 steps across the exam  room with minimal wobbling--> a VERY SIGNIFICANT improvement compared to last week.   LABS:    Chemistry      Component Value Date/Time   NA 139 09/08/2018 0624   NA 141 08/29/2018 0000   K 3.6 09/08/2018 0624   CL 109 09/08/2018 0624   CO2 20 08/29/2018 0000   BUN 16 09/08/2018 0624   BUN 18 08/29/2018 0000   CREATININE 0.70 09/08/2018 0624      Component Value Date/Time   CALCIUM 9.5 08/29/2018 0000   ALKPHOS 56 12/04/2012 1123   AST 25 12/04/2012 1123   ALT 30 12/04/2012 1123  BILITOT 0.7 12/04/2012 1123      IMPRESSION AND PLAN:  1) Concussion: she is improving, particularly in regard to balance/unsteady gait. She continues to have mild disequilibrium feeling as her ongoing symptom. Reassured her.  Encouraged ongoing limitation of neurostimulating activities. Rx for a cane was given to pt today to help her feel more confident while walking.  2) R humeral head fracture: has ortho f/u set for this. She takes vicodin periodically and it does help.  An After Visit Summary was printed and given to the patient.  FOLLOW UP: Return in about 1 week (around 09/28/2018) for f/u concussion.  Signed:  Crissie Sickles, MD           09/21/2018

## 2018-09-22 DIAGNOSIS — M0589 Other rheumatoid arthritis with rheumatoid factor of multiple sites: Secondary | ICD-10-CM | POA: Diagnosis not present

## 2018-09-26 DIAGNOSIS — S42294A Other nondisplaced fracture of upper end of right humerus, initial encounter for closed fracture: Secondary | ICD-10-CM | POA: Diagnosis not present

## 2018-09-29 ENCOUNTER — Encounter: Payer: Self-pay | Admitting: Family Medicine

## 2018-09-29 ENCOUNTER — Ambulatory Visit (INDEPENDENT_AMBULATORY_CARE_PROVIDER_SITE_OTHER): Payer: Medicare Other | Admitting: Family Medicine

## 2018-09-29 VITALS — BP 118/65 | HR 52 | Temp 98.4°F | Resp 16 | Ht 66.0 in | Wt 155.0 lb

## 2018-09-29 DIAGNOSIS — I2510 Atherosclerotic heart disease of native coronary artery without angina pectoris: Secondary | ICD-10-CM | POA: Diagnosis not present

## 2018-09-29 DIAGNOSIS — S42291D Other displaced fracture of upper end of right humerus, subsequent encounter for fracture with routine healing: Secondary | ICD-10-CM | POA: Diagnosis not present

## 2018-09-29 DIAGNOSIS — F0781 Postconcussional syndrome: Secondary | ICD-10-CM | POA: Diagnosis not present

## 2018-09-29 DIAGNOSIS — I252 Old myocardial infarction: Secondary | ICD-10-CM

## 2018-09-29 NOTE — Patient Instructions (Signed)
Limit time spent at using your cell phone, TV, and computer.  Limit time reading books or magazines to 45 min per day.  The more you feel bored, the faster your concussion will resolve!

## 2018-09-29 NOTE — Progress Notes (Signed)
OFFICE VISIT  10/04/2018   CC:  Chief Complaint  Patient presents with  . Follow-up    For Concussion. Pt still continues to get vertigo and head pain     HPI:    Patient is a 78 y.o. Caucasian female who presents for 1 week f/u concussion. Still c/o ringing in ears.   Her disequilibrium feeling is getting significantly better. Walking is not NEARLY as "wobbly". Has some ringing in ears.  She'll be getting PT for R arm/shoulder fracture to start soon.  Has seen ortho and they recommended no surgery, just PT.  She is no longer wearing a sling, per ortho rec's.  Still putting off cardiac rehab at this time until concussion sx's clear.  She has still been watching TV, having conversations.  Not much cell phone use or computer use. Getting 10 hours of sleep per night.  Eating and drinking well.  Past Medical History:  Diagnosis Date  . Beta thalassemia, heterozygous    ?alpha? pt does not recall.  Marland Kitchen CAD (coronary artery disease) 07/2018   NSTEMI (small vessel occlusion, not amenable to intervention)-->2V CAD on cath, preserved LV systolic fxn.  . Diastolic dysfunction 97/6734  . Fatigue   . GERD (gastroesophageal reflux disease)   . Hemorrhoids   . History of adenomatous polyp of colon   . Hyperlipidemia   . 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  Surgical path NEG for atypica or malignancy.  . NSTEMI (non-ST elevated myocardial infarction) ([place]) 07/2018  . Palpitations    08/2018 Event monitor normal.  . Rheumatoid arthritis ([place]) 2007   muscle weaknes at times (Dr. Amil Amen)    Past Surgical History:  Procedure Laterality Date  . ABDOMINAL HYSTERECTOMY  1987   DUB, no malignancy.  Ovaries still in.  Marland Kitchen BLADDER REPAIR    . Cardiac Event Monitor  08/22/2018   NO arrhythmias.  Her symptomatic periods  coresponded to NSR.  Marland Kitchen CHOLECYSTECTOMY  1969  . COLONOSCOPY  11/07/2017   2014 adenoma.  Rpt 10/2017 adenoma x 1.  No further colonoscopies needed per GI.   Marland Kitchen LEFT HEART CATH AND CORONARY ANGIOGRAPHY N/A 08/08/2018   Procedure: LEFT HEART CATH AND CORONARY ANGIOGRAPHY;  Surgeon: Troy Sine, MD;  Location: [facility];  Service: Cardiovascular;  Laterality: N/A;  . THYROIDECTOMY  2012   cytologic atypia on nodule bx; surgical path NEG for atypica or malignancy.  . TRANSTHORACIC ECHOCARDIOGRAM  08/07/2018   EF 60-65%, no wall motion abnl, grd II DD, mod MR.  Marland Kitchen UPPER GASTROINTESTINAL ENDOSCOPY    . VAGINAL PROLAPSE REPAIR  2012    Outpatient Medications Prior to Visit  Medication Sig Dispense Refill  . acetaminophen (TYLENOL) 500 MG tablet Take 500 mg by mouth every 6 (six) hours as needed for headache (pain).    Marland Kitchen aspirin EC 81 MG tablet Take 81 mg by mouth at bedtime.    . Calcium Citrate-Vitamin D (CALCIUM CITRATE + D PO) Take 1 tablet by mouth 2 (two) times daily.     . Cholecalciferol (VITAMIN D3) 125 MCG (5000 UT) CAPS Take 5,000 Units by mouth daily.     . Cyanocobalamin (B-12) 500 MCG TABS Take 500 mcg by mouth daily.     . folic acid (FOLVITE) 1 MG tablet Take 1 mg by mouth 2 (two) times daily.     Marland Kitchen  HYDROcodone-acetaminophen (NORCO/VICODIN) 5-325 MG tablet Take 1-2 tablets by mouth every 4 (four) hours as needed for moderate pain or severe pain. 30 tablet 0  . isosorbide mononitrate (IMDUR) 60 MG 24 hr tablet Take 1 tablet (60 mg total) by mouth daily. 30 tablet 3  . levothyroxine (SYNTHROID) 100 MCG tablet Take 100 mcg by mouth daily before breakfast.     . LORazepam (ATIVAN) 0.5 MG tablet 1-2 tabs po bid prn anxiety (Patient taking differently: Take 0.5 mg by mouth 2 (two) times daily as needed for anxiety. ) 60 tablet 1  . Magnesium 250 MG TABS Take 250 mg by mouth daily.     . methotrexate 2.5 MG tablet Take 12.5 mg by mouth every Sunday.     . Multiple Vitamin  (MULTIVITAMIN WITH MINERALS) TABS tablet Take 1 tablet by mouth daily.    . nitroGLYCERIN (NITROSTAT) 0.4 MG SL tablet Place 1 tablet (0.4 mg total) under the tongue every 5 (five) minutes x 3 doses as needed for chest pain. 25 tablet 3  . omega-3 acid ethyl esters (LOVAZA) 1 g capsule Take 2 capsules (2 g total) by mouth daily. 90 capsule 3  . ondansetron (ZOFRAN ODT) 4 MG disintegrating tablet Take 1 tablet (4 mg total) by mouth every 8 (eight) hours as needed for nausea or vomiting. 20 tablet 0  . potassium chloride (KLOR-CON) 8 MEQ tablet Take 2 tablets (16 mEq total) by mouth daily. (Patient taking differently: Take 16 mEq by mouth 2 (two) times daily. ) 60 tablet 6  . rosuvastatin (CRESTOR) 40 MG tablet Take 1 tablet (40 mg total) by mouth daily at 6 PM. 90 tablet 3  . ticagrelor (BRILINTA) 90 MG TABS tablet Take 1 tablet (90 mg total) by mouth 2 (two) times daily. 60 tablet 0   No facility-administered medications prior to visit.     Allergies  Allergen Reactions  . Neosporin [Neomycin-Bacitracin Zn-Polymyx] Other (See Comments)    Patient states that wounds or scratches never heal. (pt uses bacitracin)   . Seasonal Ic [Cholestatin] Other (See Comments)    Leg cramps  . Triamcinolone Other (See Comments)    Burning sensation  . Atorvastatin Other (See Comments)    Leg cramps  . Monascus Purpureus Went Yeast Other (See Comments)    "Red Yeast Rice" causes leg cramps  . Pravastatin Other (See Comments)    Leg cramps    ROS As per HPI  PE: Blood pressure 118/65, pulse (!) 52, temperature 98.4 F (36.9 C), temperature source Temporal, resp. rate 16, height 5\' 6"  (1.676 m), weight 155 lb (70.3 kg), SpO2 98 %. Gen: Alert, well appearing.  Patient is oriented to person, place, time, and situation. AFFECT: pleasant, lucid thought and speech. Memory is intact. Neuro: CN 2-12 intact bilaterally, strength 5/5 in proximal and distal upper extremities and lower extremities bilaterally.   No sensory deficits.  No tremor.  No disdiadochokinesis.  No ataxia.  Upper extremity and lower extremity DTRs symmetric.  No pronator drift. She can walk heel-to-toe across the room with minimal instability.  LABS:    Chemistry      Component Value Date/Time   NA 139 09/08/2018 0624   NA 141 08/29/2018 0000   K 3.6 09/08/2018 0624   CL 109 09/08/2018 0624   CO2 20 08/29/2018 0000   BUN 16 09/08/2018 0624   BUN 18 08/29/2018 0000   CREATININE 0.70 09/08/2018 0624      Component Value Date/Time   CALCIUM  9.5 08/29/2018 0000   ALKPHOS 56 12/04/2012 1123   AST 25 12/04/2012 1123   ALT 30 12/04/2012 1123   BILITOT 0.7 12/04/2012 1123     Lab Results  Component Value Date   WBC 7.5 08/14/2018   HGB 10.9 (L) 09/08/2018   HCT 32.0 (L) 09/08/2018   MCV 95.5 08/14/2018   PLT 272 08/14/2018   Lab Results  Component Value Date   TSH 0.439 08/07/2018    IMPRESSION AND PLAN:  1) Concussion: symptoms continue to gradually resolve. I reiterated the importance of avoiding mind-stimulating activities and any significant physical exercise (other than PT for her arm/shoulder). Signs/symptoms to call or return for were reviewed and pt expressed understanding.  2) Right humeral head fracture: not requiring any narcotic pain med. Has seen ortho (Dr. Stann Mainland, per pt) and there will be no surgery, just PT.  3) NSTEMI recently: asymptomatic. She had palpitations shortly after, event monitor normal.  No further palpitations at this time. She'll do cardiac rehab after concussion totally resolves.  An After Visit Summary was printed and given to the patient.  FOLLOW UP: Return in about 2 weeks (around 10/13/2018) for f/u concussion.  Signed:  Crissie Sickles, MD           10/04/2018

## 2018-10-02 NOTE — Progress Notes (Signed)
Bonnie [name] 78 y.o. female DOB Aug 19, 1941 MRN [id number]       Nutrition  No diagnosis found. Past Medical History:  Diagnosis Date  . Beta thalassemia, heterozygous    ?alpha? pt does not recall.  Marland Kitchen CAD (coronary artery disease) 07/2018   NSTEMI (small vessel occlusion, not amenable to intervention)-->2V CAD on cath, preserved LV systolic fxn.  . Diastolic dysfunction 75/6433  . Fatigue   . GERD (gastroesophageal reflux disease)   . Hemorrhoids   . History of adenomatous polyp of colon   . Hyperlipidemia   . 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  Surgical path NEG for atypica or malignancy.  . NSTEMI (non-ST elevated myocardial infarction) ([place]) 07/2018  . Palpitations    08/2018 Event monitor normal.  . Rheumatoid arthritis ([place]) 2007   muscle weaknes at times (Dr. Amil Amen)   Meds reviewed.    Current Outpatient Medications (Endocrine & Metabolic):  .  levothyroxine (SYNTHROID) 100 MCG tablet, Take 100 mcg by mouth daily before breakfast.   Current Outpatient Medications (Cardiovascular):  .  isosorbide mononitrate (IMDUR) 60 MG 24 hr tablet, Take 1 tablet (60 mg total) by mouth daily. .  nitroGLYCERIN (NITROSTAT) 0.4 MG SL tablet, Place 1 tablet (0.4 mg total) under the tongue every 5 (five) minutes x 3 doses as needed for chest pain. Marland Kitchen  omega-3 acid ethyl esters (LOVAZA) 1 g capsule, Take 2 capsules (2 g total) by mouth daily. .  rosuvastatin (CRESTOR) 40 MG tablet, Take 1 tablet (40 mg total) by mouth daily at 6 PM.   Current Outpatient Medications (Analgesics):  .  acetaminophen (TYLENOL) 500 MG tablet, Take 500 mg by mouth every 6 (six) hours as needed for headache (pain). Marland Kitchen  aspirin EC 81 MG tablet, Take 81 mg by mouth at bedtime. Marland Kitchen  HYDROcodone-acetaminophen (NORCO/VICODIN) 5-325 MG tablet, Take  1-2 tablets by mouth every 4 (four) hours as needed for moderate pain or severe pain.  Current Outpatient Medications (Hematological):  Marland Kitchen  Cyanocobalamin (B-12) 500 MCG TABS, Take 500 mcg by mouth daily.  .  folic acid (FOLVITE) 1 MG tablet, Take 1 mg by mouth 2 (two) times daily.  .  ticagrelor (BRILINTA) 90 MG TABS tablet, Take 1 tablet (90 mg total) by mouth 2 (two) times daily.  Current Outpatient Medications (Other):  Marland Kitchen  Calcium Citrate-Vitamin D (CALCIUM CITRATE + D PO), Take 1 tablet by mouth 2 (two) times daily.  .  Cholecalciferol (VITAMIN D3) 125 MCG (5000 UT) CAPS, Take 5,000 Units by mouth daily.  Marland Kitchen  LORazepam (ATIVAN) 0.5 MG tablet, 1-2 tabs po bid prn anxiety (Patient taking differently: Take 0.5 mg by mouth 2 (two) times daily as needed for anxiety. ) .  Magnesium 250 MG TABS, Take 250 mg by mouth daily.  .  methotrexate 2.5 MG tablet, Take 12.5 mg by mouth every Sunday.  .  Multiple Vitamin (MULTIVITAMIN WITH MINERALS) TABS tablet, Take 1 tablet by mouth daily. .  ondansetron (ZOFRAN ODT) 4 MG disintegrating tablet, Take 1 tablet (4 mg total) by mouth every 8 (eight) hours as needed for nausea or vomiting. .  potassium chloride (KLOR-CON) 8 MEQ tablet, Take 2 tablets (16 mEq total) by mouth daily. (Patient taking differently: Take 16 mEq by mouth 2 (two) times daily. )   HT: Ht Readings from Last 1 Encounters:  09/29/18 5'  6" (1.676 m)    WT: Wt Readings from Last 5 Encounters:  09/29/18 155 lb (70.3 kg)  09/21/18 158 lb 8 oz (71.9 kg)  09/14/18 157 lb (71.2 kg)  09/07/18 157 lb (71.2 kg)  08/18/18 156 lb 8 oz (71 kg)     BMI= 25.03    09/29/18   Current tobacco use? No       Labs:  Lipid Panel     Component Value Date/Time   CHOL 112 08/08/2018 0503   TRIG 54 08/08/2018 0503   HDL 51 08/08/2018 0503   CHOLHDL 2.2 08/08/2018 0503   VLDL 11 08/08/2018 0503   LDLCALC 50 08/08/2018 0503   LDLDIRECT 155.5 12/04/2012 1123    Lab Results  Component Value Date    HGBA1C 5.7 (H) 08/07/2018   CBG (last 3)  No results for input(s): GLUCAP in the last 72 hours.  Nutrition Diagnosis ? Food-and nutrition-related knowledge deficit related to lack of exposure to information as related to diagnosis of: ? CVD   Nutrition Goal(s):  ? To be determined  Plan:  Pt to attend nutrition classes ? Nutrition I ? Nutrition II ? Portion Distortion  Will provide client-centered nutrition education as part of interdisciplinary care.   Monitor and evaluate progress toward nutrition goal with team.  Laurina Bustle, MS, RD, LDN 10/02/2018 9:29 PM

## 2018-10-03 ENCOUNTER — Telehealth: Payer: Self-pay | Admitting: Family Medicine

## 2018-10-03 ENCOUNTER — Telehealth (HOSPITAL_COMMUNITY): Payer: Self-pay | Admitting: Cardiac Rehabilitation

## 2018-10-03 DIAGNOSIS — R2681 Unsteadiness on feet: Secondary | ICD-10-CM

## 2018-10-03 DIAGNOSIS — H9313 Tinnitus, bilateral: Secondary | ICD-10-CM

## 2018-10-03 DIAGNOSIS — F0781 Postconcussional syndrome: Secondary | ICD-10-CM

## 2018-10-03 NOTE — Telephone Encounter (Signed)
This is likely from her concussion.  Unfortunately there is nothing that can be done about it. No re-evaluation is needed at this time.  Hopefully the ringing will fade away over time.-thx

## 2018-10-03 NOTE — Telephone Encounter (Signed)
Okay to order referral? Please advise. Thanks.

## 2018-10-03 NOTE — Telephone Encounter (Signed)
See other phone note dated 10/03/18.

## 2018-10-03 NOTE — Telephone Encounter (Signed)
Patient was seen at [facility] PT for her arm/shoulder. While she was there they mentioned they could do PT for the "ringing" in her ears and the "wobbly" sensations she has been having. Please enter a referral so that Medicare will cover the visit.

## 2018-10-03 NOTE — Telephone Encounter (Signed)
Patient states that she now has ringing in her ears that just doesn't stop. Could this be from her recent concussion?  Patient states it just will not stop.  Please advise if she needs to be re-evaluated or if there is something you recommend. Thanks.

## 2018-10-03 NOTE — Telephone Encounter (Signed)
Please advise. Thanks.  

## 2018-10-03 NOTE — Telephone Encounter (Signed)
[name], Bonnie S 1 hour ago (1:31 PM)      Patient was seen at [facility] PT for her arm/shoulder. While she was there they mentioned they could do PT for the "ringing" in her ears and the "wobbly" sensations she has been having. Please enter a referral so that Medicare will cover the visit.

## 2018-10-03 NOTE — Telephone Encounter (Signed)
pc to pt for RN interview in preparation for CRPII.  Pt with recent fall at home with injury. Pt has right arm fracture requiring physical therapy. Pt also has recovering concussion. Cardiac rehab will be put on hold.  Pt will notify CR dept of PT anticipated time line.  CR appts to be cancelled at this time and rescheduled when appropriate.  Maurice Small, RN, Navigator made aware.  Andi Hence, RN, BSN Cardiac Pulmonary Rehab

## 2018-10-04 DIAGNOSIS — S42294A Other nondisplaced fracture of upper end of right humerus, initial encounter for closed fracture: Secondary | ICD-10-CM | POA: Diagnosis not present

## 2018-10-04 NOTE — Telephone Encounter (Signed)
OK, Great. Referral ordered.-thx

## 2018-10-04 NOTE — Telephone Encounter (Signed)
Left message for pt to call back  °

## 2018-10-04 NOTE — Telephone Encounter (Signed)
Pt advised and voiced understanding.   

## 2018-10-05 ENCOUNTER — Inpatient Hospital Stay (HOSPITAL_COMMUNITY)
Admission: RE | Admit: 2018-10-05 | Discharge: 2018-10-05 | Disposition: A | Discharge status: Home / Self Care | Payer: Medicare Other | Source: Ambulatory Visit

## 2018-10-06 DIAGNOSIS — S42294A Other nondisplaced fracture of upper end of right humerus, initial encounter for closed fracture: Secondary | ICD-10-CM | POA: Diagnosis not present

## 2018-10-07 NOTE — Progress Notes (Signed)
Cardiology Office Note   Date:  10/09/2018   ID:  [name], DOB September 22, 1940, MRN [id number]  PCP:  Tammi Sou, MD  Cardiologist:   Minus Breeding, MD  Referring:  Tammi Sou, MD  Chief Complaint  Patient presents with  . Dizziness     History of Present Illness: Bonnie [name] is a 78 y.o. female who presents for evaluation ochest pain.  She was in the hospital in 08/06/2018 describing heavy pressure midsternal with associated nausea. EKG in ED was abnormal with inverted T-wave in Lead III.    She had cardiac cath on 08/08/2018 revealing 2 vessel disease with 60-65 % in the mLAD, and mRCA with an abrupt cut off in the mid posterior lateral vessel which was a small caliber vessel and suspected to be the culprit vessel with normal L Cx. She did not require intervention but was treated with DAPT with ASA and Brilinta, with aggressive lipid lowering recommended with increased dose of Crestor.She was noted to have moderate MR and mild AI on echo with normal LV function .    She returns for follow up.  Since I last saw her and since she was in the hospital she broke her shoulder.  She is going have to go for rehab for this.  She is not having surgery.  She still has palpitations but she is not having any presyncope or syncope.  She had a fall not loss of consciousness.  She denies any ongoing chest pressure similar to the presenting complaints.  She has no shortness of breath, PND or orthopnea.  She is had no symptoms similar to that which brought her to the hospital in November when she had her non-STEMI.  She does have headaches related to the fall when she not only hurt her shoulder but her head.  She thinks she has postconcussive symptoms.   Past Medical History:  Diagnosis Date  . Beta thalassemia, heterozygous    ?alpha? pt does not recall.  Marland Kitchen CAD (coronary artery disease) 07/2018   NSTEMI (small vessel occlusion, not amenable to intervention)-->2V CAD on cath,  preserved LV systolic fxn.  . Diastolic dysfunction 05/2329  . Fatigue   . GERD (gastroesophageal reflux disease)   . Hemorrhoids   . History of adenomatous polyp of colon   . Hyperlipidemia   . 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  Surgical path NEG for atypica or malignancy.  . NSTEMI (non-ST elevated myocardial infarction) ([place]) 07/2018  . Palpitations    08/2018 Event monitor normal.  . Rheumatoid arthritis ([place]) 2007   muscle weaknes at times (Dr. Amil Amen)    Past Surgical History:  Procedure Laterality Date  . ABDOMINAL HYSTERECTOMY  1987   DUB, no malignancy.  Ovaries still in.  Marland Kitchen BLADDER REPAIR    . Cardiac Event Monitor  08/22/2018   NO arrhythmias.  Her symptomatic periods coresponded to NSR.  Marland Kitchen CHOLECYSTECTOMY  1969  . COLONOSCOPY  11/07/2017   2014 adenoma.  Rpt 10/2017 adenoma x 1.  No further colonoscopies needed per GI.   Marland Kitchen LEFT HEART CATH AND CORONARY ANGIOGRAPHY N/A 08/08/2018   Procedure: LEFT HEART CATH AND CORONARY ANGIOGRAPHY;  Surgeon: Troy Sine, MD;  Location: [place] [facility];  Service: Cardiovascular;  Laterality: N/A;  . THYROIDECTOMY  2012   cytologic atypia on nodule  bx; surgical path NEG for atypica or malignancy.  . TRANSTHORACIC ECHOCARDIOGRAM  08/07/2018   EF 60-65%, no wall motion abnl, grd II DD, mod MR.  Marland Kitchen UPPER GASTROINTESTINAL ENDOSCOPY    . VAGINAL PROLAPSE REPAIR  2012     Current Outpatient Medications  Medication Sig Dispense Refill  . acetaminophen (TYLENOL) 500 MG tablet Take 500 mg by mouth every 6 (six) hours as needed for headache (pain).    Marland Kitchen aspirin EC 81 MG tablet Take 81 mg by mouth at bedtime.    . Calcium Citrate-Vitamin D (CALCIUM CITRATE + D PO) Take 1 tablet by mouth 2 (two) times daily.     . Cholecalciferol (VITAMIN D3) 25 MCG (1000 UT) CAPS Take  1 capsule by mouth daily.    . Cyanocobalamin (B-12) 500 MCG TABS Take 500 mcg by mouth daily.     . folic acid (FOLVITE) 1 MG tablet Take 1 mg by mouth 2 (two) times daily.     Marland Kitchen HYDROcodone-acetaminophen (NORCO/VICODIN) 5-325 MG tablet Take 1-2 tablets by mouth every 4 (four) hours as needed for moderate pain or severe pain. 30 tablet 0  . isosorbide mononitrate (IMDUR) 60 MG 24 hr tablet Take 1 tablet (60 mg total) by mouth daily. 30 tablet 3  . levothyroxine (SYNTHROID) 100 MCG tablet Take 100 mcg by mouth daily before breakfast.     . LORazepam (ATIVAN) 0.5 MG tablet 1-2 tabs po bid prn anxiety (Patient taking differently: Take 0.5 mg by mouth 2 (two) times daily as needed for anxiety. ) 60 tablet 1  . Magnesium 125 MG CAPS Take 1 capsule by mouth daily.    . methotrexate 2.5 MG tablet Take 12.5 mg by mouth every Sunday.     . Multiple Vitamin (MULTIVITAMIN WITH MINERALS) TABS tablet Take 1 tablet by mouth daily.    . nitroGLYCERIN (NITROSTAT) 0.4 MG SL tablet Place 1 tablet (0.4 mg total) under the tongue every 5 (five) minutes x 3 doses as needed for chest pain. 25 tablet 3  . potassium chloride (KLOR-CON) 8 MEQ tablet Take 2 tablets (16 mEq total) by mouth daily. (Patient taking differently: Take 16 mEq by mouth 2 (two) times daily. ) 60 tablet 6  . rosuvastatin (CRESTOR) 40 MG tablet Take 1 tablet (40 mg total) by mouth daily at 6 PM. 90 tablet 3  . ticagrelor (BRILINTA) 90 MG TABS tablet Take 1 tablet (90 mg total) by mouth 2 (two) times daily. 60 tablet 0  . omega-3 acid ethyl esters (LOVAZA) 1 g capsule Take 2 capsules (2 g total) by mouth daily. (Patient not taking: Reported on 10/09/2018) 90 capsule 3   No current facility-administered medications for this visit.     Allergies:   Neosporin [neomycin-bacitracin zn-polymyx]; Seasonal ic [cholestatin]; Triamcinolone; Atorvastatin; Monascus purpureus went yeast; and Pravastatin    ROS:  Please see the history of present illness.    Otherwise, review of systems are positive for none.   All other systems are reviewed and negative.    PHYSICAL EXAM: VS:  BP 122/73   Pulse 67   Ht 5\' 6"  (1.676 m)   Wt 151 lb 12.8 oz (68.9 kg)   BMI 24.50 kg/m  , BMI Body mass index is 24.5 kg/m. GENERAL:  Well appearing NECK:  No jugular venous distention, waveform within normal limits, carotid upstroke brisk and symmetric, no bruits, no thyromegaly LUNGS:  Clear to auscultation bilaterally CHEST:  Unremarkable HEART:  PMI not displaced or sustained,S1 and S2  within normal limits, no S3, no S4, no clicks, no rubs, no murmurs ABD:  Flat, positive bowel sounds normal in frequency in pitch, no bruits, no rebound, no guarding, no midline pulsatile mass, no hepatomegaly, no splenomegaly EXT:  2 plus pulses throughout, no edema, no cyanosis no clubbing  EKG:  EKG is not ordered today.   Recent Labs: 08/07/2018: TSH 0.439 08/14/2018: Platelets 272 08/29/2018: Magnesium 2.4 09/08/2018: BUN 16; Creatinine, Ser 0.70; Hemoglobin 10.9; Potassium 3.6; Sodium 139    Lipid Panel    Component Value Date/Time   CHOL 112 08/08/2018 0503   TRIG 54 08/08/2018 0503   HDL 51 08/08/2018 0503   CHOLHDL 2.2 08/08/2018 0503   VLDL 11 08/08/2018 0503   LDLCALC 50 08/08/2018 0503   LDLDIRECT 155.5 12/04/2012 1123     Lab Results  Component Value Date   TSH 0.439 08/07/2018     Wt Readings from Last 3 Encounters:  10/09/18 151 lb 12.8 oz (68.9 kg)  09/29/18 155 lb (70.3 kg)  09/21/18 158 lb 8 oz (71.9 kg)      Other studies Reviewed: Additional studies/ records that were reviewed today include: ED records from Dec. Review of the above records demonstrates:    See elsewhere   ASSESSMENT AND PLAN:  CAD: The patient has small vessel disease and nonobstructive LAD disease.  We reviewed again her anatomy.  No change in therapy.  She will participate in cardiac rehab after she does physical therapy on her shoulder.  At this point no change  in therapy.  She is given instructions to stop her Brilinta at the end of May.  MR:  This was moderate on echo.  I reviewed this with her and her husband.  This we will follow clinically.  AI:  This was mild.  No change in therapy.  PALPITATIONS:   She had no significant arrhythmias.  She has some palpitations but prefers conservative therapy.  No change in meds is planned.   DIZZINESS:  I did ask her to talk about possible postconcussive symptoms with her primary provider.  She was not orthostatic today.   Current medicines are reviewed at length with the patient today.  The patient does not have concerns regarding medicines.  The following changes have been made:  None  Labs/ tests ordered today include:   None  No orders of the defined types were placed in this encounter.    Disposition:   FU with me in June.    Signed, Minus Breeding, MD  10/09/2018 11:40 AM    [facility]

## 2018-10-09 ENCOUNTER — Encounter: Payer: Self-pay | Admitting: Cardiology

## 2018-10-09 ENCOUNTER — Ambulatory Visit (INDEPENDENT_AMBULATORY_CARE_PROVIDER_SITE_OTHER): Payer: Medicare Other | Admitting: Cardiology

## 2018-10-09 VITALS — BP 122/73 | HR 67 | Ht 66.0 in | Wt 151.8 lb

## 2018-10-09 DIAGNOSIS — R42 Dizziness and giddiness: Secondary | ICD-10-CM | POA: Diagnosis not present

## 2018-10-09 DIAGNOSIS — R002 Palpitations: Secondary | ICD-10-CM | POA: Diagnosis not present

## 2018-10-09 DIAGNOSIS — I2510 Atherosclerotic heart disease of native coronary artery without angina pectoris: Secondary | ICD-10-CM

## 2018-10-09 DIAGNOSIS — I340 Nonrheumatic mitral (valve) insufficiency: Secondary | ICD-10-CM | POA: Diagnosis not present

## 2018-10-09 NOTE — Patient Instructions (Signed)
Medication Instructions:  STOP- Brilinta ending of May  If you need a refill on your cardiac medications before your next appointment, please call your pharmacy.  Labwork: None Ordered HERE IN OUR OFFICE AT [facility]  You will need to fast. DO NOT EAT OR DRINK PAST MIDNIGHT.     You will NOT need to fast   Take the provided lab slips with you to the lab for your blood draw.   When you have your labs (blood work) drawn today and your tests are completely normal, you will receive your results only by MyChart Message (if you have MyChart) -OR-  A paper copy in the mail.  If you have any lab test that is abnormal or we need to change your treatment, we will call you to review these results.  Testing/Procedures: None Ordered  Follow-Up: You will need a follow up appointment in 6 months.  Please call our office 2 months in advance to schedule this appointment.  You may see Minus Breeding, MD or one of the following Advanced Practice Providers on your designated Care Team:   Rosaria Ferries, PA-C . Jory Sims, DNP, ANP    At [facility], you and your health needs are our priority.  As part of our continuing mission to provide you with exceptional heart care, we have created designated Provider Care Teams.  These Care Teams include your primary Cardiologist (physician) and Advanced Practice Providers (APPs -  Physician Assistants and Nurse Practitioners) who all work together to provide you with the care you need, when you need it.  Thank you for choosing [facility] at [facility]!!

## 2018-10-10 ENCOUNTER — Telehealth: Payer: Self-pay | Admitting: Family Medicine

## 2018-10-10 DIAGNOSIS — S42294A Other nondisplaced fracture of upper end of right humerus, initial encounter for closed fracture: Secondary | ICD-10-CM | POA: Diagnosis not present

## 2018-10-10 NOTE — Telephone Encounter (Signed)
Noted  

## 2018-10-10 NOTE — Telephone Encounter (Signed)
Patient walked in to office to give a message to Dr. Anitra Lauth or Nurse.. She stated that she just left PT. PT told her to contact her provider regarding of recurring symptoms.  She is complaining of ringing in her ears, little dizzy.  Please contact patient on cell. [phone]

## 2018-10-10 NOTE — Telephone Encounter (Signed)
FYI.   SW pt and advised her that Dr. Anitra Lauth had previously told her that the ringing in her ears and dizziness was from her concussion and there wasn't anything we could do for it. We did put in a referral to PT as requested by pt to see if they could help. Pt advised that she has f/u apt with Dr. Anitra Lauth on 10/19/18 at 2:00pm, she voiced understanding and is okay waiting till then to be seen.

## 2018-10-11 ENCOUNTER — Ambulatory Visit (HOSPITAL_COMMUNITY): Payer: Medicare Other

## 2018-10-11 NOTE — Telephone Encounter (Signed)
Patient voiced understanding. Close encounter at this time.

## 2018-10-12 ENCOUNTER — Ambulatory Visit (HOSPITAL_COMMUNITY): Payer: Medicare Other

## 2018-10-13 ENCOUNTER — Ambulatory Visit (HOSPITAL_COMMUNITY): Payer: Medicare Other

## 2018-10-13 DIAGNOSIS — S42294A Other nondisplaced fracture of upper end of right humerus, initial encounter for closed fracture: Secondary | ICD-10-CM | POA: Diagnosis not present

## 2018-10-16 ENCOUNTER — Ambulatory Visit (HOSPITAL_COMMUNITY): Payer: Medicare Other

## 2018-10-16 ENCOUNTER — Telehealth: Payer: Self-pay

## 2018-10-16 NOTE — Telephone Encounter (Signed)
Spoke with patient. She slipped on the floor on 10/08/2018. She said that she hit her head on a wooden chair. Since injury has been having photophobia, tinnitus and dizziness with positional changes. Patient denies having headaches. Was referred by [facility] Pt. On schedule on Wednesday.

## 2018-10-17 ENCOUNTER — Ambulatory Visit (HOSPITAL_COMMUNITY): Payer: Medicare Other

## 2018-10-17 DIAGNOSIS — S42294A Other nondisplaced fracture of upper end of right humerus, initial encounter for closed fracture: Secondary | ICD-10-CM | POA: Diagnosis not present

## 2018-10-17 NOTE — Progress Notes (Signed)
Subjective:   I, Bonnie [name], am serving as a scribe for Dr. Hulan Saas.   Chief Complaint: Bonnie [name], DOB: 21-May-1941, is a 78 y.o. female who presents for head injury. Referred from [facility] PT. She slipped on the floor at her home and fell and hit her head on a wooden chair. Patient did not lose consciousness. Has been having photophobia, tinnitus and dizziness with positional changes. CT performed on 09/07/2018: 1. No evidence of traumatic intracranial injury or fracture. 2. No evidence of fracture or subluxation along the cervical spine. 3. Soft tissue laceration overlying the right frontal calvarium. 4. Mild to moderate cortical volume loss noted. 5. Mild degenerative change at the lower cervical spine.  Patient also fractured humerus in fall on 12/26 and has been doing PT since then for fracture.    Injury date : 09/07/2018 Visit #: 1  Previous imagine.   History of Present Illness:    Concussion Self-Reported Symptom Score Symptoms rated on a scale 1-6, in last 24 hours  Headache:0  Nausea:2  Vomiting: 0  Balance Difficulty: 4   Dizziness:2  Fatigue: 0  Trouble Falling Asleep:0  Sleep More Than Usual:1  Sleep Less Than Usual: 0  Daytime Drowsiness: 1  Photophobia: 5  Phonophobia: 3  Irritability: 2  Sadness0  Nervousness: 0  Feeling More Emotional: 0  Numbness or Tingling:0  Feeling Slowed Down:4  Feeling Mentally Foggy0  Difficulty Concentrating:0  Difficulty Remembering: 0  Visual Problems:1    Total Symptom Score: 25   Review of Systems: Pertinent items are noted in HPI.  Review of History: Past Medical History:  Past Medical History:  Diagnosis Date  . Beta thalassemia, heterozygous    ?alpha? pt does not recall.  Marland Kitchen CAD (coronary artery disease) 07/2018   NSTEMI (small vessel occlusion, not amenable to intervention)-->2V CAD on cath, preserved LV systolic fxn.  . Diastolic dysfunction 25/9563  . Fatigue   . GERD (gastroesophageal  reflux disease)   . Hemorrhoids   . History of adenomatous polyp of colon   . Hyperlipidemia   . 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  Surgical path NEG for atypica or malignancy.  . NSTEMI (non-ST elevated myocardial infarction) ([place]) 07/2018   Brilinta added to ASA-->brilinta to be discontinued after 6 months per Dr. Percival Spanish (this will be the end of May 2020)  . Palpitations    08/2018 Event monitor normal.  . Rheumatoid arthritis ([place]) 2007   muscle weaknes at times (Dr. Amil Amen)    Past Surgical History:  has a past surgical history that includes Abdominal hysterectomy (1987); Cholecystectomy (1969); Thyroidectomy (2012); Colonoscopy (11/07/2017); Upper gastrointestinal endoscopy; Bladder repair; Vaginal prolapse repair (2012); LEFT HEART CATH AND CORONARY ANGIOGRAPHY (N/A, 08/08/2018); transthoracic echocardiogram (08/07/2018); and Cardiac Event Monitor (08/22/2018). Family History: family history includes CVA in her mother; Hypertension in her mother. no family history of autoimmune Social History:  reports that she quit smoking about 44 years ago. Her smoking use included cigarettes. She has a 5.00 pack-year smoking history. She has never used smokeless tobacco. She reports current alcohol use. She reports that she does not use drugs. Current Medications: has a current medication list which includes the following prescription(s): acetaminophen, aspirin ec, calcium citrate-vitamin d, vitamin d3, O-75, folic acid, hydrocodone-acetaminophen, isosorbide mononitrate, levothyroxine, lorazepam, magnesium, methotrexate, multivitamin with minerals, nitroglycerin, omega-3 acid ethyl esters, potassium chloride,  rosuvastatin, and ticagrelor. Allergies: is allergic to neosporin [neomycin-bacitracin zn-polymyx]; seasonal ic [cholestatin];  triamcinolone; atorvastatin; monascus purpureus went yeast; and pravastatin.  Objective:    Physical Examination Vitals:   10/18/18 0925  BP: 128/64  Pulse: 67   General: No apparent distress alert and oriented x3 mood and affect normal, dressed appropriately.  HEENT: Pupils equal, extraocular movements intact  Respiratory: Patient's speak in full sentences and does not appear short of breath  Cardiovascular: Trace lower extremity edema, non tender, no erythema  Skin: Warm dry intact with no signs of infection or rash on extremities or on axial skeleton.  Abdomen: Soft nontender  Neuro: Cranial nerves II through XII are intact, neurovascularly intact in all extremities with 2+ DTRs and 2+ pulses.  Lymph: No lymphadenopathy of posterior or anterior cervical chain or axillae bilaterally.  Gait ambulating with the aid of a cane MSK: Patient continues to favor her right shoulder from the proximal humerus fracture.Marland Kitchen  Psychiatric: Oriented X3, intact recent and remote memory, judgement and insight, normal mood and mild flat affect  Concussion testing performed today:    Vestibular Screening:       Headache  Dizziness  Smooth Pursuits n n  H. Saccades n n  V. Saccades n n  H. VOR n n  V. VOR n n  Visual Motor Sensitivity n n           Balance Screen: 24/30  Additional testing performed today: Patient did remarkably well with serial sevens and recall.   Assessment:    Coordination problem - Plan: Ambulatory referral to Physical Therapy  Concussion without loss of consciousness, initial encounter  Jesika D Parlier presents with the following concussion subtypes. [] Cognitive [] Cervical [] Vestibular [] Ocular [] Migraine [] Anxiety/Mood   I was personally involved with the physical evaluation of and am in agreement with the assessment and treatment plan for this patient.  Greater than 50% of this encounter was spent in direct consultation with the patient in evaluation,  counseling, and coordination of care. Duration of encounter: 56 minutes.  After Visit Summary printed out and provided to patient as appropriate.

## 2018-10-18 ENCOUNTER — Ambulatory Visit (INDEPENDENT_AMBULATORY_CARE_PROVIDER_SITE_OTHER): Payer: Medicare Other | Admitting: Family Medicine

## 2018-10-18 ENCOUNTER — Ambulatory Visit (HOSPITAL_COMMUNITY): Payer: Medicare Other

## 2018-10-18 VITALS — BP 128/64 | HR 67 | Ht 66.0 in | Wt 151.0 lb

## 2018-10-18 DIAGNOSIS — R279 Unspecified lack of coordination: Secondary | ICD-10-CM | POA: Diagnosis not present

## 2018-10-18 DIAGNOSIS — I2510 Atherosclerotic heart disease of native coronary artery without angina pectoris: Secondary | ICD-10-CM

## 2018-10-18 DIAGNOSIS — R42 Dizziness and giddiness: Secondary | ICD-10-CM | POA: Diagnosis not present

## 2018-10-18 DIAGNOSIS — S060XAA Concussion with loss of consciousness status unknown, initial encounter: Secondary | ICD-10-CM | POA: Insufficient documentation

## 2018-10-18 DIAGNOSIS — S060X9A Concussion with loss of consciousness of unspecified duration, initial encounter: Secondary | ICD-10-CM | POA: Insufficient documentation

## 2018-10-18 DIAGNOSIS — S060X0A Concussion without loss of consciousness, initial encounter: Secondary | ICD-10-CM | POA: Diagnosis not present

## 2018-10-18 NOTE — Patient Instructions (Signed)
Good to see you  We will get you in with PT  I also want you to take The cod liver you are taking now Vitamin D but 2000IU daily  CoQ10 200mg  daily  See me again in 3-4 weeks and we will make sure you are yourself

## 2018-10-18 NOTE — Assessment & Plan Note (Signed)
Possible mild overall but I think nearly completely resolved.  I believe the patient does have more deconditioning secondary to patient's recent heart attack that could have been contributing.  In addition to this patient did fall and has been doing less activity secondary to the proximal humeral fracture.  Patient is trying to get this better to be able to make some adjustments.  Discussed with patient and over-the-counter medications that I think will be beneficial and we can get patient in vestibular neuro secondary to balance coordination and dizziness.  Patient will follow-up with me again in 3 to 4 weeks to make sure making some adjustments.

## 2018-10-19 ENCOUNTER — Ambulatory Visit (HOSPITAL_COMMUNITY): Payer: Medicare Other

## 2018-10-19 ENCOUNTER — Encounter: Payer: Self-pay | Admitting: Family Medicine

## 2018-10-19 ENCOUNTER — Ambulatory Visit (INDEPENDENT_AMBULATORY_CARE_PROVIDER_SITE_OTHER): Payer: Medicare Other | Admitting: Family Medicine

## 2018-10-19 VITALS — BP 116/74 | HR 56 | Temp 97.4°F | Resp 16 | Ht 66.0 in | Wt 151.0 lb

## 2018-10-19 DIAGNOSIS — I2510 Atherosclerotic heart disease of native coronary artery without angina pectoris: Secondary | ICD-10-CM | POA: Diagnosis not present

## 2018-10-19 DIAGNOSIS — F0781 Postconcussional syndrome: Secondary | ICD-10-CM | POA: Diagnosis not present

## 2018-10-19 DIAGNOSIS — E7849 Other hyperlipidemia: Secondary | ICD-10-CM | POA: Diagnosis not present

## 2018-10-19 DIAGNOSIS — E039 Hypothyroidism, unspecified: Secondary | ICD-10-CM | POA: Diagnosis not present

## 2018-10-19 NOTE — Progress Notes (Signed)
OFFICE VISIT  10/19/2018   CC:  Chief Complaint  Patient presents with  . Follow-up    RCI   HPI:    Patient is a 78 y.o. Caucasian female who presents for 2 mo f/u CAD. She had NSTEMI a little over 2 mo ago.  See PMH/PSH section below for details. Echo showed grd II DD with mod MR and preserved LV systolic function with normal wall motion. Plan is DAPT for 1 yr per cardiology.   Shortly after getting out of hospital she had a problem with palpitations. An even monitor showed no dysrhythmia.  She saw Dr. Percival Spanish 10/09/2018 and no changes were made.  Other chronic issues are hyperlipidemia and hypothyroidism, with normal labs at the end of Nov 2019.  She takes her thyroid med on empty stomach and does not take iron or acid suppression med with it.  She also suffered a concussion 5 wks ago.  She sustained a R humeral head in the same fall and this is being treated nonoperatively. Essentially her only impairment from a concussion standpoint was balance/gait instability. This has improved over time.  Also tinnitus (R ear >L) and some episodes of vertigo when she lies back came on after her head injury-->PT has been working with her. She saw Dr. Tamala Julian and Vit D and CoQ10 were recommended. She continues to have just a mild amount of HA where she hit her head.  Mild disequilibrium feeling in head on/off.  No wobbling when walking.  She is still trying to limit mental strain. Still getting PT for R shoulder and also for her vertigo and ringing.  Past Medical History:  Diagnosis Date  . Beta thalassemia, heterozygous    ?alpha? pt does not recall.  Marland Kitchen CAD (coronary artery disease) 07/2018   NSTEMI (small vessel occlusion, not amenable to intervention)-->2V CAD on cath, preserved LV systolic fxn.  . Diastolic dysfunction 68/3419  . Fatigue   . GERD (gastroesophageal reflux disease)   . Hemorrhoids   . History of adenomatous polyp of colon   . Hyperlipidemia   . 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  Surgical path NEG for atypica or malignancy.  . NSTEMI (non-ST elevated myocardial infarction) ([place]) 07/2018   Brilinta added to ASA-->brilinta to be discontinued after 6 months per Dr. Percival Spanish (this will be the end of May 2020)  . Palpitations    08/2018 Event monitor normal.  . Rheumatoid arthritis ([place]) 2007   muscle weaknes at times (Dr. Amil Amen)    Past Surgical History:  Procedure Laterality Date  . ABDOMINAL HYSTERECTOMY  1987   DUB, no malignancy.  Ovaries still in.  Marland Kitchen BLADDER REPAIR    . Cardiac Event Monitor  08/22/2018   NO arrhythmias.  Her symptomatic periods coresponded to NSR.  Marland Kitchen CHOLECYSTECTOMY  1969  . COLONOSCOPY  11/07/2017   2014 adenoma.  Rpt 10/2017 adenoma x 1.  No further colonoscopies needed per GI.   Marland Kitchen LEFT HEART CATH AND CORONARY ANGIOGRAPHY N/A 08/08/2018   Procedure: LEFT HEART CATH AND CORONARY ANGIOGRAPHY;  Surgeon: Troy Sine, MD;  Location: [facility];  Service: Cardiovascular;  Laterality: N/A;  . THYROIDECTOMY  2012   cytologic atypia on nodule bx; surgical path NEG for atypica or malignancy.  . TRANSTHORACIC ECHOCARDIOGRAM  08/07/2018   EF 60-65%, no wall motion abnl, grd II  DD, mod MR.  Marland Kitchen UPPER GASTROINTESTINAL ENDOSCOPY    . VAGINAL PROLAPSE REPAIR  2012    Outpatient Medications Prior to Visit  Medication Sig Dispense Refill  . acetaminophen (TYLENOL) 500 MG tablet Take 500 mg by mouth every 6 (six) hours as needed for headache (pain).    Marland Kitchen aspirin EC 81 MG tablet Take 81 mg by mouth at bedtime.    . Calcium Citrate-Vitamin D (CALCIUM CITRATE + D PO) Take 1 tablet by mouth 2 (two) times daily.     . chlorhexidine (PERIDEX) 0.12 % solution     . Cholecalciferol (VITAMIN D3) 25 MCG (1000 UT) CAPS Take 1 capsule by mouth daily.    . Coenzyme  Q10 (COQ10 PO) Take by mouth.    . Cyanocobalamin (B-12) 500 MCG TABS Take 500 mcg by mouth daily.     Marland Kitchen FISH OIL-KRILL OIL PO Take by mouth.    . folic acid (FOLVITE) 1 MG tablet Take 1 mg by mouth 2 (two) times daily.     Marland Kitchen HYDROcodone-acetaminophen (NORCO/VICODIN) 5-325 MG tablet Take 1-2 tablets by mouth every 4 (four) hours as needed for moderate pain or severe pain. 30 tablet 0  . isosorbide mononitrate (IMDUR) 60 MG 24 hr tablet Take 1 tablet (60 mg total) by mouth daily. 30 tablet 3  . levothyroxine (SYNTHROID) 100 MCG tablet Take 100 mcg by mouth daily before breakfast.     . LORazepam (ATIVAN) 0.5 MG tablet 1-2 tabs po bid prn anxiety (Patient taking differently: Take 0.5 mg by mouth 2 (two) times daily as needed for anxiety. ) 60 tablet 1  . Magnesium 125 MG CAPS Take 1 capsule by mouth daily.    . methotrexate 2.5 MG tablet Take 12.5 mg by mouth every Sunday.     . Multiple Vitamin (MULTIVITAMIN WITH MINERALS) TABS tablet Take 1 tablet by mouth daily.    . nitroGLYCERIN (NITROSTAT) 0.4 MG SL tablet Place 1 tablet (0.4 mg total) under the tongue every 5 (five) minutes x 3 doses as needed for chest pain. 25 tablet 3  . omega-3 acid ethyl esters (LOVAZA) 1 g capsule Take 2 capsules (2 g total) by mouth daily. 90 capsule 3  . potassium chloride (KLOR-CON) 8 MEQ tablet Take 2 tablets (16 mEq total) by mouth daily. (Patient taking differently: Take 16 mEq by mouth 2 (two) times daily. ) 60 tablet 6  . rosuvastatin (CRESTOR) 40 MG tablet Take 1 tablet (40 mg total) by mouth daily at 6 PM. 90 tablet 3  . ticagrelor (BRILINTA) 90 MG TABS tablet Take 1 tablet (90 mg total) by mouth 2 (two) times daily. 60 tablet 0   No facility-administered medications prior to visit.     Allergies  Allergen Reactions  . Neosporin [Neomycin-Bacitracin Zn-Polymyx] Other (See Comments)    Patient states that wounds or scratches never heal. (pt uses bacitracin)   . Seasonal Ic [Cholestatin] Other (See  Comments)    Leg cramps  . Triamcinolone Other (See Comments)    Burning sensation  . Atorvastatin Other (See Comments)    Leg cramps  . Monascus Purpureus Went Yeast Other (See Comments)    "Red Yeast Rice" causes leg cramps  . Pravastatin Other (See Comments)    Leg cramps    ROS As per HPI  PE: Blood pressure 116/74, pulse (!) 56, temperature (!) 97.4 F (36.3 C), temperature source Oral, resp. rate 16, height 5\' 6"  (1.676 m), weight 151 lb (68.5 kg), SpO2 96 %.  Gen: Alert, well appearing.  Patient is oriented to person, place, time, and situation. AFFECT: pleasant, lucid thought and speech. No instability or gait abnormality.  Walks heel-to-toe very well.  LABS:  Lab Results  Component Value Date   TSH 0.439 08/07/2018   Lab Results  Component Value Date   WBC 7.5 08/14/2018   HGB 10.9 (L) 09/08/2018   HCT 32.0 (L) 09/08/2018   MCV 95.5 08/14/2018   PLT 272 08/14/2018   Lab Results  Component Value Date   CREATININE 0.70 09/08/2018   BUN 16 09/08/2018   NA 139 09/08/2018   K 3.6 09/08/2018   CL 109 09/08/2018   CO2 20 08/29/2018   Lab Results  Component Value Date   ALT 30 12/04/2012   AST 25 12/04/2012   ALKPHOS 56 12/04/2012   BILITOT 0.7 12/04/2012   Lab Results  Component Value Date   CHOL 112 08/08/2018   Lab Results  Component Value Date   HDL 51 08/08/2018   Lab Results  Component Value Date   LDLCALC 50 08/08/2018   Lab Results  Component Value Date   TRIG 54 08/08/2018   Lab Results  Component Value Date   CHOLHDL 2.2 08/08/2018   Lab Results  Component Value Date   HGBA1C 5.7 (H) 08/07/2018    IMPRESSION AND PLAN:  1) CAD; doing great/asymptomatic. Continue AS/brilinta until at least the end of Nov 2020.  Continue imdur and statin. F/u with Dr. Percival Spanish as appropriate.  She'll start cardiac rehab after shoulder PT and after all concussion sx's are resolved. Lipids excellent 08/08/18.  2) hypothyroidism: TSH normal  08/07/2018.  3) Concussion syndrome: gradually improving. Continue vit D and coQ10 as per Dr. Thompson Caul recommendations.  Signs/symptoms to call or return for were reviewed and pt expressed understanding.  Spent 25 min with pt today, with >50% of this time spent in counseling and care coordination regarding the above problems.  An After Visit Summary was printed and given to the patient.  FOLLOW UP: Return in about 2 months (around 12/18/2018) for routine chronic illness f/u (30 min).  Signed:  Crissie Sickles, MD           10/19/2018

## 2018-10-20 ENCOUNTER — Ambulatory Visit (HOSPITAL_COMMUNITY): Payer: Medicare Other

## 2018-10-20 DIAGNOSIS — S42294A Other nondisplaced fracture of upper end of right humerus, initial encounter for closed fracture: Secondary | ICD-10-CM | POA: Diagnosis not present

## 2018-10-23 ENCOUNTER — Ambulatory Visit (HOSPITAL_COMMUNITY): Payer: Medicare Other

## 2018-10-23 DIAGNOSIS — S42294A Other nondisplaced fracture of upper end of right humerus, initial encounter for closed fracture: Secondary | ICD-10-CM | POA: Diagnosis not present

## 2018-10-24 ENCOUNTER — Telehealth: Payer: Self-pay | Admitting: Cardiology

## 2018-10-24 ENCOUNTER — Ambulatory Visit (HOSPITAL_COMMUNITY): Payer: Medicare Other

## 2018-10-24 DIAGNOSIS — S42294A Other nondisplaced fracture of upper end of right humerus, initial encounter for closed fracture: Secondary | ICD-10-CM | POA: Diagnosis not present

## 2018-10-24 NOTE — Telephone Encounter (Signed)
Spoke with patient and advised to continue Imdur as ordered. Mailed copy of AVS from last visit.

## 2018-10-24 NOTE — Telephone Encounter (Signed)
New Message   Pt c/o medication issue:  1. Name of Medication: isosorbide mononitrate (IMDUR) 60 MG 24 hr tablet    2. How are you currently taking this medication (dosage and times per day)? : Take 1 tablet (60 mg total) by mouth daily.  3. Are you having a reaction (difficulty breathing--STAT)?   4. What is your medication issue?  Patient states that she was prescribed this medication in the hospital. She wants to be sure that she is suppose to be taking it still. Please call to discuss.

## 2018-10-25 ENCOUNTER — Ambulatory Visit (HOSPITAL_COMMUNITY): Payer: Medicare Other

## 2018-10-25 ENCOUNTER — Other Ambulatory Visit: Payer: Self-pay | Admitting: Cardiology

## 2018-10-25 MED ORDER — ISOSORBIDE MONONITRATE ER 60 MG PO TB24: 60.0000 mg | ORAL_TABLET | Freq: Every day | ORAL | 3 refills | Status: DC

## 2018-10-25 NOTE — Telephone Encounter (Signed)
° ° ° °*  STAT* If patient is at the pharmacy, call can be transferred to refill team.   1. Which medications need to be refilled? (please list name of each medication and dose if known) isosorbide mononitrate (IMDUR) 60 MG 24 hr tablet 2. Which pharmacy/location (including street and city if local pharmacy) is medication to be sent to?[id number] - [place], [place] - [address] 3. Do they need a 30 day or 90 day supply? [place]

## 2018-10-25 NOTE — Telephone Encounter (Signed)
Rx(s) sent to pharmacy electronically.  

## 2018-10-26 ENCOUNTER — Ambulatory Visit (HOSPITAL_COMMUNITY): Payer: Medicare Other

## 2018-10-26 DIAGNOSIS — S42294A Other nondisplaced fracture of upper end of right humerus, initial encounter for closed fracture: Secondary | ICD-10-CM | POA: Diagnosis not present

## 2018-10-27 ENCOUNTER — Telehealth (HOSPITAL_COMMUNITY): Payer: Self-pay

## 2018-10-27 ENCOUNTER — Ambulatory Visit (HOSPITAL_COMMUNITY): Payer: Medicare Other

## 2018-10-27 NOTE — Telephone Encounter (Signed)
Pt is still in PT, pt is interested in Cardiac rehab and will call back to sch when PT is over. Tedra Senegal. Support Rep II

## 2018-10-30 ENCOUNTER — Ambulatory Visit (HOSPITAL_COMMUNITY): Payer: Medicare Other

## 2018-10-30 DIAGNOSIS — S42294A Other nondisplaced fracture of upper end of right humerus, initial encounter for closed fracture: Secondary | ICD-10-CM | POA: Diagnosis not present

## 2018-10-31 ENCOUNTER — Ambulatory Visit (HOSPITAL_COMMUNITY): Payer: Medicare Other

## 2018-10-31 DIAGNOSIS — R279 Unspecified lack of coordination: Secondary | ICD-10-CM | POA: Diagnosis not present

## 2018-11-01 ENCOUNTER — Ambulatory Visit (HOSPITAL_COMMUNITY): Payer: Medicare Other

## 2018-11-01 DIAGNOSIS — S42294A Other nondisplaced fracture of upper end of right humerus, initial encounter for closed fracture: Secondary | ICD-10-CM | POA: Diagnosis not present

## 2018-11-02 ENCOUNTER — Telehealth: Payer: Self-pay | Admitting: *Deleted

## 2018-11-02 ENCOUNTER — Ambulatory Visit (HOSPITAL_COMMUNITY): Payer: Medicare Other

## 2018-11-02 MED ORDER — TETANUS-DIPHTH-ACELL PERTUSSIS 5-2.5-18.5 LF-MCG/0.5 IM SUSP: 0.5000 mL | Freq: Once | INTRAMUSCULAR | 0 refills | Status: AC

## 2018-11-02 MED ORDER — ZOSTER VAC RECOMB ADJUVANTED 50 MCG/0.5ML IM SUSR: 0.5000 mL | Freq: Once | INTRAMUSCULAR | 1 refills | Status: AC

## 2018-11-02 NOTE — Telephone Encounter (Signed)
Okay to send Rx for Tdap and Shingrix to pts pharmacy? Please advise. Thanks.

## 2018-11-02 NOTE — Telephone Encounter (Signed)
Yes, this is ok.

## 2018-11-02 NOTE — Telephone Encounter (Signed)
Rx for Tdap and Shingirx sent to pts pharmacy.

## 2018-11-02 NOTE — Telephone Encounter (Signed)
Copied from [place] [id number]. Topic: General - Other >> Oct 30, 2018  3:56 PM Oneta Rack wrote: Reason for CRM:   Caller name:  Steinmeyer,[place] Relation to pt: spouse  Call back number: [phone]    Reason for call:  Spouse would like to know if patient immunizations are up to date, please advise >> Oct 31, 2018 10:21 AM Onalee Hua, CMA wrote: [facility].  Pt is due for Prevnar 13, Tdap and Shingrix. We can give her Prevnar 13 at her next f/u apt. As for the others we can send Rx to her pharmacy.  Okay for PEC to advise pt. >> Nov 01, 2018  5:24 PM Rutherford Nail, NT wrote: Patient returning Heather's phone call. Advised of message below. Patient states understanding and would like the medications send to the pharmacy that are able to be sent.

## 2018-11-03 ENCOUNTER — Ambulatory Visit (HOSPITAL_COMMUNITY): Payer: Medicare Other

## 2018-11-03 DIAGNOSIS — R279 Unspecified lack of coordination: Secondary | ICD-10-CM | POA: Diagnosis not present

## 2018-11-06 ENCOUNTER — Ambulatory Visit (HOSPITAL_COMMUNITY): Payer: Medicare Other

## 2018-11-06 ENCOUNTER — Telehealth: Payer: Self-pay | Admitting: Cardiology

## 2018-11-06 DIAGNOSIS — S42294A Other nondisplaced fracture of upper end of right humerus, initial encounter for closed fracture: Secondary | ICD-10-CM | POA: Diagnosis not present

## 2018-11-06 NOTE — Telephone Encounter (Signed)
Left message for pt to call.

## 2018-11-06 NOTE — Telephone Encounter (Signed)
° °  Patient states the dizziness may be coming from newly added medications   1) Are you dizzy now? YES  2) Do you feel faint or have you passed out? NO  3) Do you have any other symptoms? Lightheaded, ringing in ears  4) Have you checked your HR and BP (record if available)? N/A

## 2018-11-07 ENCOUNTER — Ambulatory Visit (HOSPITAL_COMMUNITY): Payer: Medicare Other

## 2018-11-07 DIAGNOSIS — R279 Unspecified lack of coordination: Secondary | ICD-10-CM | POA: Diagnosis not present

## 2018-11-07 NOTE — Telephone Encounter (Signed)
Patient returned call. I asked how she feeling, and patient states that she is doing PT now for her balance because of a fall she had due to dizziness.  Patient would like to know if any of her medications could be causing her dizziness. I see that Dr.Hochrein has tried to advise on the dizziness before, but patient states that her Brilinta and Imdur is new for her and she wanted to make sure it was okay with her other medications.  Please advise? Thank you!

## 2018-11-07 NOTE — Telephone Encounter (Signed)
Multiple of her medications may cause dizziness (lorazepam, norco, brilinta). Her recent concussion is thought to be the culprit of her current dizziness and this may last several months. .   Is not recommended to stop Brilinta or Imdur at this time due recent MI (Nov/2019). Will need further assessment by APP/MD to change therapy.  No significant drug-drug interaction was noted with any of her current medication.

## 2018-11-07 NOTE — Telephone Encounter (Signed)
Called patient back advising of note from PharmD. Patient verbalized understanding.  Would continue to monitor dizziness. No questions.

## 2018-11-07 NOTE — Telephone Encounter (Signed)
Called LVM advising patient to call back to discuss how she was doing. Left call back number.

## 2018-11-08 ENCOUNTER — Ambulatory Visit (HOSPITAL_COMMUNITY): Payer: Medicare Other

## 2018-11-08 DIAGNOSIS — S42294A Other nondisplaced fracture of upper end of right humerus, initial encounter for closed fracture: Secondary | ICD-10-CM | POA: Diagnosis not present

## 2018-11-09 ENCOUNTER — Ambulatory Visit (INDEPENDENT_AMBULATORY_CARE_PROVIDER_SITE_OTHER): Payer: Medicare Other | Admitting: Family Medicine

## 2018-11-09 ENCOUNTER — Ambulatory Visit (HOSPITAL_COMMUNITY): Payer: Medicare Other

## 2018-11-09 ENCOUNTER — Encounter: Payer: Self-pay | Admitting: Family Medicine

## 2018-11-09 DIAGNOSIS — I2510 Atherosclerotic heart disease of native coronary artery without angina pectoris: Secondary | ICD-10-CM | POA: Diagnosis not present

## 2018-11-09 DIAGNOSIS — S060X0D Concussion without loss of consciousness, subsequent encounter: Secondary | ICD-10-CM

## 2018-11-09 NOTE — Assessment & Plan Note (Signed)
Significant improvement at this time.  Encouraged the patient to continue to work with vestibular neuro.  Believe that 3 or so visits patient will likely have significant improvement.  Continue vitamin supplementation.  We discussed that the ringing in the ears could be a side effect of some of the aspirin.  I do feel that in within the next 2 weeks patient would be able to start cardiopulmonary again.  Discussed with her to contact them as well.  Follow-up 4 to 6 weeks

## 2018-11-09 NOTE — Patient Instructions (Signed)
Good to see you  Try arnica lotion 2 times a day for the brusing  The aspirin may cause a little of the ringing in the ears and you may be a little more sensitive OK to travel  OK to get new glasses and it may help as well  Continue to work with [name] OK to start cardio PT agin on March 9th or so  See me again in 4 weeks to make sur eyou are perfect

## 2018-11-09 NOTE — Progress Notes (Signed)
Subjective:   I, [name], am serving as a scribe for Dr. Hulan Saas.   Chief Complaint: Bonnie [name], DOB: 15-Sep-1940, is a 78 y.o. female who presents for head injury follow up. Patient states that she has been working with [employer]. Patient feels like her balance is affected by her leg pain. Mentions having pain in her head where she hit her head.   Patient is feeling better overall.  Patient is only had one episode with the vestibular neuro training.  Has not restarted in cardiopulmonary rehab yet.  Patient was feeling better overall.  Not making progress.  Still some mild forgetfulness and mild discomfort in the head from the area of impact   Injury date : 09/07/2018 Visit #: 2  Previous imagine.   History of Present Illness:   Review of Systems: Pertinent items are noted in HPI.  Review of History: Past Medical History:  Past Medical History:  Diagnosis Date  . Beta thalassemia, heterozygous    ?alpha? pt does not recall.  Marland Kitchen CAD (coronary artery disease) 07/2018   NSTEMI (small vessel occlusion, not amenable to intervention)-->2V CAD on cath, preserved LV systolic fxn.  . Diastolic dysfunction 36/6440  . Fatigue   . GERD (gastroesophageal reflux disease)   . Hemorrhoids   . History of adenomatous polyp of colon   . Hyperlipidemia   . 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  Surgical path NEG for atypica or malignancy.  . NSTEMI (non-ST elevated myocardial infarction) ([place]) 07/2018   Brilinta added to ASA-->brilinta to be discontinued after 6 months per Dr. Percival Spanish (this will be the end of May 2020)  . Palpitations    08/2018 Event monitor normal.  . Rheumatoid arthritis ([place]) 2007   muscle weaknes at times (Dr. Amil Amen)    Past Surgical History:  has a past surgical history that includes  Abdominal hysterectomy (1987); Cholecystectomy (1969); Thyroidectomy (2012); Colonoscopy (11/07/2017); Upper gastrointestinal endoscopy; Bladder repair; Vaginal prolapse repair (2012); LEFT HEART CATH AND CORONARY ANGIOGRAPHY (N/A, 08/08/2018); transthoracic echocardiogram (08/07/2018); and Cardiac Event Monitor (08/22/2018). Family History: family history includes CVA in her mother; Hypertension in her mother. no family history of autoimmune Social History:  reports that she quit smoking about 45 years ago. Her smoking use included cigarettes. She has a 5.00 pack-year smoking history. She has never used smokeless tobacco. She reports current alcohol use. She reports that she does not use drugs. Current Medications: has a current medication list which includes the following prescription(s): acetaminophen, aspirin ec, calcium citrate-vitamin d, chlorhexidine, vitamin d3, coenzyme q10, b-12, fish oil-krill oil, folic acid, hydrocodone-acetaminophen, isosorbide mononitrate, levothyroxine, lorazepam, magnesium, methotrexate, multivitamin with minerals, nitroglycerin, omega-3 acid ethyl esters, potassium chloride, rosuvastatin, and ticagrelor. Allergies: is allergic to neosporin [neomycin-bacitracin zn-polymyx]; seasonal ic [cholestatin]; triamcinolone; atorvastatin; monascus purpureus went yeast; and pravastatin.  Objective:    Physical Examination Vitals:   11/09/18 1030  BP: 132/64  Pulse: 65  SpO2: 97%   General: No apparent distress alert and oriented x3 mood and affect normal, dressed appropriately.  HEENT: Pupils equal, extraocular movements intact significant improvement with no significant nystagmus noted Respiratory: Patient's speak in full sentences and does not appear short of breath  Cardiovascular: No lower extremity edema, non tender, no erythema  Skin: Warm dry intact with no signs of infection or rash on extremities or on axial skeleton.  Abdomen: Soft  nontender  Neuro: Cranial  nerves II through XII are intact, neurovascularly intact in all extremities with 2+ DTRs and 2+ pulses.  Lymph: No lymphadenopathy of posterior or anterior cervical chain or axillae bilaterally.  Gait normal with good balance and coordination.  MSK:  Non tender with full range of motion and good stability and symmetric strength and tone of  elbows, wrist,  knee and ankles bilaterally.  Right shoulder still has some lack of motion secondary to history of humeral fracture Psychiatric: Oriented X3, intact recent and remote memory, judgement and insight, normal mood and affect  Concussion testing performed today:  I spent 45 minutes with patient discussing test and results including review of history and patient chart and  integration of patient data, interpretation of standardized test results and clinical data, clinical decision making, treatment planning and report,and interactive feedback to the patient with all of patients questions answered.

## 2018-11-10 ENCOUNTER — Ambulatory Visit (HOSPITAL_COMMUNITY): Payer: Medicare Other

## 2018-11-10 ENCOUNTER — Telehealth: Payer: Self-pay | Admitting: Family Medicine

## 2018-11-10 DIAGNOSIS — R279 Unspecified lack of coordination: Secondary | ICD-10-CM | POA: Diagnosis not present

## 2018-11-10 NOTE — Telephone Encounter (Signed)
Patient came by office today to ask if she is up to date on all her immunizations. She has an appt with Dr.McGowen in April.  Please contact patient to set up any immunizations she may be missing ie, shingles, pnuemonia.  Thank you Hinton Dyer

## 2018-11-10 NOTE — Telephone Encounter (Signed)
Pt is due for Prevnar 13, Tdap and Shingrix. We will give her the Prevnar 13 at her next f/u ov. Tdap and Shingrix has been sent to her pharmacy.  Pt advised and voiced understanding.

## 2018-11-13 ENCOUNTER — Ambulatory Visit (HOSPITAL_COMMUNITY): Payer: Medicare Other

## 2018-11-13 DIAGNOSIS — S42294A Other nondisplaced fracture of upper end of right humerus, initial encounter for closed fracture: Secondary | ICD-10-CM | POA: Diagnosis not present

## 2018-11-14 ENCOUNTER — Ambulatory Visit (HOSPITAL_COMMUNITY): Payer: Medicare Other

## 2018-11-14 DIAGNOSIS — R279 Unspecified lack of coordination: Secondary | ICD-10-CM | POA: Diagnosis not present

## 2018-11-15 ENCOUNTER — Ambulatory Visit (HOSPITAL_COMMUNITY): Payer: Medicare Other

## 2018-11-15 DIAGNOSIS — S42294A Other nondisplaced fracture of upper end of right humerus, initial encounter for closed fracture: Secondary | ICD-10-CM | POA: Diagnosis not present

## 2018-11-16 ENCOUNTER — Ambulatory Visit (HOSPITAL_COMMUNITY): Payer: Medicare Other

## 2018-11-16 DIAGNOSIS — R279 Unspecified lack of coordination: Secondary | ICD-10-CM | POA: Diagnosis not present

## 2018-11-17 ENCOUNTER — Ambulatory Visit (HOSPITAL_COMMUNITY): Payer: Medicare Other

## 2018-11-20 ENCOUNTER — Ambulatory Visit (HOSPITAL_COMMUNITY): Payer: Medicare Other

## 2018-11-20 DIAGNOSIS — S42294A Other nondisplaced fracture of upper end of right humerus, initial encounter for closed fracture: Secondary | ICD-10-CM | POA: Diagnosis not present

## 2018-11-21 ENCOUNTER — Ambulatory Visit (HOSPITAL_COMMUNITY): Payer: Medicare Other

## 2018-11-22 ENCOUNTER — Ambulatory Visit (HOSPITAL_COMMUNITY): Payer: Medicare Other

## 2018-11-22 DIAGNOSIS — S42294A Other nondisplaced fracture of upper end of right humerus, initial encounter for closed fracture: Secondary | ICD-10-CM | POA: Diagnosis not present

## 2018-11-23 ENCOUNTER — Ambulatory Visit (HOSPITAL_COMMUNITY): Payer: Medicare Other

## 2018-11-23 DIAGNOSIS — R279 Unspecified lack of coordination: Secondary | ICD-10-CM | POA: Diagnosis not present

## 2018-11-23 NOTE — Telephone Encounter (Signed)
Called patient to see if she was interested in participating in the Cardiac Rehab Program. Patient stated yes. Patient will come in for orientation on 12/19/2018 @ 815AM and will attend the 945AM exercise class.  Pt stated her HHPT is due to end 3-24/3-27 but verbalized understanding if her HHPT extends into her orientation date. She will have to contact CR to reschedule.  Mailed homework package.

## 2018-11-24 ENCOUNTER — Ambulatory Visit (HOSPITAL_COMMUNITY): Payer: Medicare Other

## 2018-11-27 ENCOUNTER — Ambulatory Visit (HOSPITAL_COMMUNITY): Payer: Medicare Other

## 2018-11-28 ENCOUNTER — Ambulatory Visit (HOSPITAL_COMMUNITY): Payer: Medicare Other

## 2018-11-29 ENCOUNTER — Ambulatory Visit (HOSPITAL_COMMUNITY): Payer: Medicare Other

## 2018-11-30 ENCOUNTER — Ambulatory Visit (HOSPITAL_COMMUNITY): Payer: Medicare Other

## 2018-12-01 ENCOUNTER — Ambulatory Visit (HOSPITAL_COMMUNITY): Payer: Medicare Other

## 2018-12-04 ENCOUNTER — Ambulatory Visit (HOSPITAL_COMMUNITY): Payer: Medicare Other

## 2018-12-05 ENCOUNTER — Telehealth: Payer: Self-pay

## 2018-12-05 ENCOUNTER — Telehealth (HOSPITAL_COMMUNITY): Payer: Self-pay

## 2018-12-05 ENCOUNTER — Ambulatory Visit (HOSPITAL_COMMUNITY): Payer: Medicare Other

## 2018-12-05 NOTE — Telephone Encounter (Signed)
Spoke with patient. She said that she is doing better. Did some vestibular therapy with [name] at [facility]. Said that she is more steady on her feet and that she continues to perform the balance training now at home. Patient does note a ringing in her ears but is unsure if that was there prior to her head injury or secondarily to her injury. Patient would like to not reschedule at this time and will call back if she needs further evaluation.

## 2018-12-05 NOTE — Telephone Encounter (Signed)
Copied from [place] [id number]. Topic: Appointment Scheduling - Scheduling Inquiry for Clinic >> Dec 05, 2018  1:26 PM Rutherford Nail, [place] wrote: Reason for CRM: Patient calling and states that she canceled her 4 week follow up with Dr Tamala Julian due to COVID 19 concerns. Would like to know if he thinks she needs the follow up? States that she would like a call regarding this and if he does want her to follow up, she could reschedule for a month out. Please advise.

## 2018-12-05 NOTE — Telephone Encounter (Signed)
Called and spoke with pt in regards to CR, adv pt our department is closed due to the COVID-19 for the next 4 weeks. And we will contact him once we resume scheduling. Pt verbalized understanding.

## 2018-12-05 NOTE — Progress Notes (Deleted)
[name] [address] [place], [place] [address] Phone: [phone] Subjective:    I'm seeing this patient by the request  of:    CC:   OIN:[id number]  Bonnie [name] is a 78 y.o. female coming in with complaint of ***  Onset-  Location Duration-  Character- Aggravating factors- Reliving factors-  Therapies tried-  Severity-     Past Medical History:  Diagnosis Date  . Beta thalassemia, heterozygous    ?alpha? pt does not recall.  Marland Kitchen CAD (coronary artery disease) 07/2018   NSTEMI (small vessel occlusion, not amenable to intervention)-->2V CAD on cath, preserved LV systolic fxn.  . Diastolic dysfunction 05/6282  . Fatigue   . GERD (gastroesophageal reflux disease)   . Hemorrhoids   . History of adenomatous polyp of colon   . Hyperlipidemia   . 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  Surgical path NEG for atypica or malignancy.  . NSTEMI (non-ST elevated myocardial infarction) ([place]) 07/2018   Brilinta added to ASA-->brilinta to be discontinued after 6 months per Dr. Percival Spanish (this will be the end of May 2020)  . Palpitations    08/2018 Event monitor normal.  . Rheumatoid arthritis ([place]) 2007   muscle weaknes at times (Dr. Amil Amen)   Past Surgical History:  Procedure Laterality Date  . ABDOMINAL HYSTERECTOMY  1987   DUB, no malignancy.  Ovaries still in.  Marland Kitchen BLADDER REPAIR    . Cardiac Event Monitor  08/22/2018   NO arrhythmias.  Her symptomatic periods coresponded to NSR.  Marland Kitchen CHOLECYSTECTOMY  1969  . COLONOSCOPY  11/07/2017   2014 adenoma.  Rpt 10/2017 adenoma x 1.  No further colonoscopies needed per GI.   Marland Kitchen LEFT HEART CATH AND CORONARY ANGIOGRAPHY N/A 08/08/2018   Procedure: LEFT HEART CATH AND CORONARY ANGIOGRAPHY;  Surgeon: Troy Sine, MD;  Location: [facility];  Service: Cardiovascular;  Laterality: N/A;  . THYROIDECTOMY  2012   cytologic atypia on nodule bx; surgical path NEG for atypica or malignancy.  . TRANSTHORACIC ECHOCARDIOGRAM  08/07/2018   EF 60-65%, no wall motion abnl, grd II DD, mod MR.  Marland Kitchen UPPER GASTROINTESTINAL ENDOSCOPY    . VAGINAL PROLAPSE REPAIR  2012   Social History   Socioeconomic History  . Marital status: Married    Spouse name: Not on file  . Number of children: 5  . Years of education: Not on file  . Highest education level: Not on file  Occupational History  . Not on file  Social Needs  . Financial resource strain: Not on file  . Food insecurity:    Worry: Not on file    Inability: Not on file  . Transportation needs:    Medical: Not on file    Non-medical: Not on file  Tobacco Use  . Smoking status: Former Smoker    Packs/day: 0.50    Years: 10.00    Pack years: 5.00    Types: Cigarettes    Last attempt to quit: 11/19/1973    Years since quitting: 45.0  . Smokeless tobacco: Never Used  Substance and Sexual Activity  . Alcohol use: Yes    Alcohol/week: 0.0 standard drinks    Comment: very rare glass of wine  . Drug use: No  . Sexual activity: Yes  Lifestyle  . Physical activity:  Days per week: Not on file    Minutes per session: Not on file  . Stress: Not on file  Relationships  . Social connections:    Talks on phone: Not on file    Gets together: Not on file    Attends religious service: Not on file    Active member of club or organization: Not on file    Attends meetings of clubs or organizations: Not on file    Relationship status: Not on file  Other Topics Concern  . Not on file  Social History Narrative   Lives with husband in [place].   Has 5 children.   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   Allergies  Allergen Reactions  . Neosporin [Neomycin-Bacitracin Zn-Polymyx] Other (See Comments)    Patient states  that wounds or scratches never heal. (pt uses bacitracin)   . Seasonal Ic [Cholestatin] Other (See Comments)    Leg cramps  . Triamcinolone Other (See Comments)    Burning sensation  . Atorvastatin Other (See Comments)    Leg cramps  . Monascus Purpureus Went Yeast Other (See Comments)    "Red Yeast Rice" causes leg cramps  . Pravastatin Other (See Comments)    Leg cramps   Family History  Problem Relation Age of Onset  . Hypertension Mother   . CVA Mother   . Colon cancer Neg Hx   . Esophageal cancer Neg Hx   . Stomach cancer Neg Hx   . Rectal cancer Neg Hx     Current Outpatient Medications (Endocrine & Metabolic):  .  levothyroxine (SYNTHROID) 100 MCG tablet, Take 100 mcg by mouth daily before breakfast.   Current Outpatient Medications (Cardiovascular):  .  isosorbide mononitrate (IMDUR) 60 MG 24 hr tablet, Take 1 tablet (60 mg total) by mouth daily. .  nitroGLYCERIN (NITROSTAT) 0.4 MG SL tablet, Place 1 tablet (0.4 mg total) under the tongue every 5 (five) minutes x 3 doses as needed for chest pain. Marland Kitchen  omega-3 acid ethyl esters (LOVAZA) 1 g capsule, Take 2 capsules (2 g total) by mouth daily. .  rosuvastatin (CRESTOR) 40 MG tablet, Take 1 tablet (40 mg total) by mouth daily at 6 PM.   Current Outpatient Medications (Analgesics):  .  acetaminophen (TYLENOL) 500 MG tablet, Take 500 mg by mouth every 6 (six) hours as needed for headache (pain). Marland Kitchen  aspirin EC 81 MG tablet, Take 81 mg by mouth at bedtime. Marland Kitchen  HYDROcodone-acetaminophen (NORCO/VICODIN) 5-325 MG tablet, Take 1-2 tablets by mouth every 4 (four) hours as needed for moderate pain or severe pain.  Current Outpatient Medications (Hematological):  Marland Kitchen  Cyanocobalamin (B-12) 500 MCG TABS, Take 500 mcg by mouth daily.  .  folic acid (FOLVITE) 1 MG tablet, Take 1 mg by mouth 2 (two) times daily.  .  ticagrelor (BRILINTA) 90 MG TABS tablet, Take 1 tablet (90 mg total) by mouth 2 (two) times daily.  Current Outpatient  Medications (Other):  Marland Kitchen  Calcium Citrate-Vitamin D (CALCIUM CITRATE + D PO), Take 1 tablet by mouth 2 (two) times daily.  .  chlorhexidine (PERIDEX) 0.12 % solution,  .  Cholecalciferol (VITAMIN D3) 25 MCG (1000 UT) CAPS, Take 1 capsule by mouth daily. .  Coenzyme Q10 (COQ10 PO), Take by mouth. Marland Kitchen  FISH OIL-KRILL OIL PO, Take by mouth. Marland Kitchen  LORazepam (ATIVAN) 0.5 MG tablet, 1-2 tabs po bid prn anxiety (Patient taking  differently: Take 0.5 mg by mouth 2 (two) times daily as needed for anxiety. ) .  Magnesium 125 MG CAPS, Take 1 capsule by mouth daily. .  methotrexate 2.5 MG tablet, Take 12.5 mg by mouth every Sunday.  .  Multiple Vitamin (MULTIVITAMIN WITH MINERALS) TABS tablet, Take 1 tablet by mouth daily. .  potassium chloride (KLOR-CON) 8 MEQ tablet, Take 2 tablets (16 mEq total) by mouth daily. (Patient taking differently: Take 16 mEq by mouth 2 (two) times daily. )    Past medical history, social, surgical and family history all reviewed in electronic medical record.  No pertanent information unless stated regarding to the chief complaint.   Review of Systems:  No headache, visual changes, nausea, vomiting, diarrhea, constipation, dizziness, abdominal pain, skin rash, fevers, chills, night sweats, weight loss, swollen lymph nodes, body aches, joint swelling, muscle aches, chest pain, shortness of breath, mood changes.   Objective  There were no vitals taken for this visit. Systems examined below as of    General: No apparent distress alert and oriented x3 mood and affect normal, dressed appropriately.  HEENT: Pupils equal, extraocular movements intact  Respiratory: Patient's speak in full sentences and does not appear short of breath  Cardiovascular: No lower extremity edema, non tender, no erythema  Skin: Warm dry intact with no signs of infection or rash on extremities or on axial skeleton.  Abdomen: Soft nontender  Neuro: Cranial nerves II through XII are intact, neurovascularly  intact in all extremities with 2+ DTRs and 2+ pulses.  Lymph: No lymphadenopathy of posterior or anterior cervical chain or axillae bilaterally.  Gait normal with good balance and coordination.  MSK:  Non tender with full range of motion and good stability and symmetric strength and tone of shoulders, elbows, wrist, hip, knee and ankles bilaterally.     Impression and Recommendations:     This case required medical decision making of moderate complexity. The above documentation has been reviewed and is accurate and complete Lyndal Pulley, DO       Note: This dictation was prepared with Dragon dictation along with smaller phrase technology. Any transcriptional errors that result from this process are unintentional.

## 2018-12-06 ENCOUNTER — Ambulatory Visit (HOSPITAL_COMMUNITY): Payer: Medicare Other

## 2018-12-06 ENCOUNTER — Ambulatory Visit: Payer: Medicare Other | Admitting: Family Medicine

## 2018-12-06 DIAGNOSIS — S42294A Other nondisplaced fracture of upper end of right humerus, initial encounter for closed fracture: Secondary | ICD-10-CM | POA: Diagnosis not present

## 2018-12-07 ENCOUNTER — Ambulatory Visit (HOSPITAL_COMMUNITY): Payer: Medicare Other

## 2018-12-08 ENCOUNTER — Ambulatory Visit (HOSPITAL_COMMUNITY): Payer: Medicare Other

## 2018-12-11 ENCOUNTER — Ambulatory Visit (HOSPITAL_COMMUNITY): Payer: Medicare Other

## 2018-12-12 ENCOUNTER — Ambulatory Visit (HOSPITAL_COMMUNITY): Payer: Medicare Other

## 2018-12-13 ENCOUNTER — Ambulatory Visit (HOSPITAL_COMMUNITY): Payer: Medicare Other

## 2018-12-14 ENCOUNTER — Ambulatory Visit (HOSPITAL_COMMUNITY): Payer: Medicare Other

## 2018-12-15 ENCOUNTER — Ambulatory Visit (HOSPITAL_COMMUNITY): Payer: Medicare Other

## 2018-12-18 ENCOUNTER — Ambulatory Visit (HOSPITAL_COMMUNITY): Payer: Medicare Other

## 2018-12-19 ENCOUNTER — Ambulatory Visit (HOSPITAL_COMMUNITY): Payer: Medicare Other

## 2018-12-20 ENCOUNTER — Other Ambulatory Visit: Payer: Self-pay

## 2018-12-20 ENCOUNTER — Ambulatory Visit (HOSPITAL_COMMUNITY): Payer: Medicare Other

## 2018-12-20 ENCOUNTER — Ambulatory Visit (INDEPENDENT_AMBULATORY_CARE_PROVIDER_SITE_OTHER): Payer: Medicare Other | Admitting: Family Medicine

## 2018-12-20 ENCOUNTER — Encounter: Payer: Self-pay | Admitting: Family Medicine

## 2018-12-20 ENCOUNTER — Telehealth: Payer: Self-pay | Admitting: Family Medicine

## 2018-12-20 VITALS — BP 107/59 | HR 54 | Wt 150.0 lb

## 2018-12-20 DIAGNOSIS — S060X0D Concussion without loss of consciousness, subsequent encounter: Secondary | ICD-10-CM

## 2018-12-20 DIAGNOSIS — I2510 Atherosclerotic heart disease of native coronary artery without angina pectoris: Secondary | ICD-10-CM

## 2018-12-20 DIAGNOSIS — I252 Old myocardial infarction: Secondary | ICD-10-CM | POA: Diagnosis not present

## 2018-12-20 DIAGNOSIS — E039 Hypothyroidism, unspecified: Secondary | ICD-10-CM

## 2018-12-20 DIAGNOSIS — I2583 Coronary atherosclerosis due to lipid rich plaque: Secondary | ICD-10-CM

## 2018-12-20 NOTE — Progress Notes (Signed)
Virtual Visit via Video Note  I connected with pt on 12/20/18 at  2:00 PM EDT by a video enabled telemedicine application and verified that I am speaking with the correct person using two identifiers.  Location patient: home Location provider:work or home office Persons participating in the virtual visit: patient, provider  I discussed the limitations of evaluation and management by telemedicine and the availability of in person appointments. The patient expressed understanding and agreed to proceed.  Telemedicine visit is a necessity given the COVID-19 restrictions in place at the current time.  HPI: 78 y/o WF with rheumatoid arthritis who is here for 2 mo f/u CAD.  She had a NSTEMI 07/2018 and has preserved LV function, is on DAPT at least until the end of 01/2019.  She was asymptomatic from a cardiac standpoint at last f/u visit. She is doing some PT for her R humerus fracture, which is nearly back to normal.  Also walks on elliptical/treadmill and takes a daily walk in her neighborhood with husband. She says she feels great. She denies any chest pain, unusual SOB, DOE.  Taking all meds as rx'd.  No melena. No imbalance, vertigo, HA's, instability, vision abnormalities.  Hearing is normal but she has ringing in ears still-->she is not sure if this was present before her head injury or not.  Appetite good, energy level good. At the time of last f/u her concussion from a mechanical fall was essentially resolved (Dr. Tamala Julian had her on CoQ10 and vit D). Thyroid and cholesterol meds unchanged at that time b/c TSH normal and lipid numbers excellent when tested while in hosp 07/2018.  ROS: no CP, no SOB, no wheezing, no cough, no dizziness, no HAs, no rashes, no melena/hematochezia.  No polyuria or polydipsia.  No myalgias or arthralgias.   Past Medical History:  Diagnosis Date  . Beta thalassemia, heterozygous    ?alpha? pt does not recall.  Marland Kitchen CAD (coronary artery disease) 07/2018   NSTEMI  (small vessel occlusion, not amenable to intervention)-->2V CAD on cath, preserved LV systolic fxn.  . Diastolic dysfunction 96/7893  . Fatigue   . GERD (gastroesophageal reflux disease)   . Hemorrhoids   . History of adenomatous polyp of colon   . Hyperlipidemia   . 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  Surgical path NEG for atypica or malignancy.  . NSTEMI (non-ST elevated myocardial infarction) ([place]) 07/2018   Brilinta added to ASA-->brilinta to be discontinued after 6 months per Dr. Percival Spanish (this will be the end of May 2020)  . Palpitations    08/2018 Event monitor normal.  . Rheumatoid arthritis ([place]) 2007   muscle weaknes at times (Dr. Amil Amen)    Past Surgical History:  Procedure Laterality Date  . ABDOMINAL HYSTERECTOMY  1987   DUB, no malignancy.  Ovaries still in.  Marland Kitchen BLADDER REPAIR    . Cardiac Event Monitor  08/22/2018   NO arrhythmias.  Her symptomatic periods coresponded to NSR.  Marland Kitchen CHOLECYSTECTOMY  1969  . COLONOSCOPY  11/07/2017   2014 adenoma.  Rpt 10/2017 adenoma x 1.  No further colonoscopies needed per GI.   Marland Kitchen LEFT HEART CATH AND CORONARY ANGIOGRAPHY N/A 08/08/2018   Procedure: LEFT HEART CATH AND CORONARY ANGIOGRAPHY;  Surgeon: Troy Sine, MD;  Location: [facility];  Service: Cardiovascular;  Laterality: N/A;  . THYROIDECTOMY  2012  cytologic atypia on nodule bx; surgical path NEG for atypica or malignancy.  . TRANSTHORACIC ECHOCARDIOGRAM  08/07/2018   EF 60-65%, no wall motion abnl, grd II DD, mod MR.  Marland Kitchen UPPER GASTROINTESTINAL ENDOSCOPY    . VAGINAL PROLAPSE REPAIR  2012    Family History  Problem Relation Age of Onset  . Hypertension Mother   . CVA Mother   . Colon cancer Neg Hx   . Esophageal cancer Neg Hx   . Stomach cancer Neg Hx   . Rectal cancer Neg Hx    Soc:  living with husband, independent in all ADL's, never smoker, rare alcohol.   Current Outpatient Medications:  .  acetaminophen (TYLENOL) 500 MG tablet, Take 500 mg by mouth every 6 (six) hours as needed for headache (pain)., Disp: , Rfl:  .  aspirin EC 81 MG tablet, Take 81 mg by mouth at bedtime., Disp: , Rfl:  .  Calcium Citrate-Vitamin D (CALCIUM CITRATE + D PO), Take 1 tablet by mouth 2 (two) times daily. , Disp: , Rfl:  .  chlorhexidine (PERIDEX) 0.12 % solution, , Disp: , Rfl:  .  Cholecalciferol (VITAMIN D3) 25 MCG (1000 UT) CAPS, Take 1 capsule by mouth daily., Disp: , Rfl:  .  Coenzyme Q10 (COQ10 PO), Take by mouth., Disp: , Rfl:  .  Cyanocobalamin (B-12) 500 MCG TABS, Take 500 mcg by mouth daily. , Disp: , Rfl:  .  FISH OIL-KRILL OIL PO, Take by mouth., Disp: , Rfl:  .  folic acid (FOLVITE) 1 MG tablet, Take 1 mg by mouth 2 (two) times daily. , Disp: , Rfl:  .  HYDROcodone-acetaminophen (NORCO/VICODIN) 5-325 MG tablet, Take 1-2 tablets by mouth every 4 (four) hours as needed for moderate pain or severe pain., Disp: 30 tablet, Rfl: 0 .  isosorbide mononitrate (IMDUR) 60 MG 24 hr tablet, Take 1 tablet (60 mg total) by mouth daily., Disp: 90 tablet, Rfl: 3 .  levothyroxine (SYNTHROID) 100 MCG tablet, Take 100 mcg by mouth daily before breakfast. , Disp: , Rfl:  .  LORazepam (ATIVAN) 0.5 MG tablet, 1-2 tabs po bid prn anxiety (Patient taking differently: Take 0.5 mg by mouth 2 (two) times daily as needed for anxiety. ), Disp: 60 tablet, Rfl: 1 .  Magnesium 125 MG CAPS, Take 1 capsule by mouth daily., Disp: , Rfl:  .  methotrexate 2.5 MG tablet, Take 12.5 mg by mouth every Sunday. , Disp: , Rfl:  .  Multiple Vitamin (MULTIVITAMIN WITH MINERALS) TABS tablet, Take 1 tablet by mouth daily., Disp: , Rfl:  .  nitroGLYCERIN (NITROSTAT) 0.4 MG SL tablet, Place 1 tablet (0.4 mg total) under the tongue every 5 (five) minutes x 3 doses as needed for chest pain., Disp: 25 tablet, Rfl: 3 .  omega-3  acid ethyl esters (LOVAZA) 1 g capsule, Take 2 capsules (2 g total) by mouth daily., Disp: 90 capsule, Rfl: 3 .  potassium chloride (KLOR-CON) 8 MEQ tablet, Take 2 tablets (16 mEq total) by mouth daily. (Patient taking differently: Take 16 mEq by mouth 2 (two) times daily. ), Disp: 60 tablet, Rfl: 6 .  rosuvastatin (CRESTOR) 40 MG tablet, Take 1 tablet (40 mg total) by mouth daily at 6 PM., Disp: 90 tablet, Rfl: 3 .  ticagrelor (BRILINTA) 90 MG TABS tablet, Take 1 tablet (90 mg total) by mouth 2 (two) times daily., Disp: 60 tablet, Rfl: 0  EXAM:  VITALS per patient if applicable: BP (!) 643/32 (BP Location: Left Arm, Patient  Position: Sitting, Cuff Size: Normal)   Pulse (!) 54   Wt 150 lb (68 kg)   BMI 24.21 kg/m    GENERAL: alert, oriented, appears well and in no acute distress  HEENT: atraumatic, conjunttiva clear, no obvious abnormalities on inspection of external nose and ears  NECK: normal movements of the head and neck  LUNGS: on inspection no signs of respiratory distress, breathing rate appears normal, no obvious gross SOB, gasping or wheezing  CV: no obvious cyanosis  MS: moves all visible extremities without noticeable abnormality  PSYCH/NEURO: pleasant and cooperative, no obvious depression or anxiety, speech and thought processing grossly intact  LABS: none    Chemistry      Component Value Date/Time   NA 139 09/08/2018 0624   NA 141 08/29/2018 0000   K 3.6 09/08/2018 0624   CL 109 09/08/2018 0624   CO2 20 08/29/2018 0000   BUN 16 09/08/2018 0624   BUN 18 08/29/2018 0000   CREATININE 0.70 09/08/2018 0624      Component Value Date/Time   CALCIUM 9.5 08/29/2018 0000   ALKPHOS 56 12/04/2012 1123   AST 25 12/04/2012 1123   ALT 30 12/04/2012 1123   BILITOT 0.7 12/04/2012 1123     Lab Results  Component Value Date   WBC 7.5 08/14/2018   HGB 10.9 (L) 09/08/2018   HCT 32.0 (L) 09/08/2018   MCV 95.5 08/14/2018   PLT 272 08/14/2018    Lab Results   Component Value Date   TSH 0.439 08/07/2018   Lab Results  Component Value Date   CHOL 112 08/08/2018   HDL 51 08/08/2018   LDLCALC 50 08/08/2018   LDLDIRECT 155.5 12/04/2012   TRIG 54 08/08/2018   CHOLHDL 2.2 08/08/2018    ASSESSMENT AND PLAN:  Discussed the following assessment and plan:  1) CAD; asymptomatic, very active. Cardiac rehab has been postponed until covid 19 crisis resolves. No labs today.  Continue Aspirin, brilinta, crestor, imdur.  HR too low for BB. Lipids at goal 07/2018: plan recheck 6 mo.  2) Closed head injury, concussion: completely resolved. 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  3) Hypothyroidism: TSH 07/2018 wnl.  Plan repeat in 6 mo.  4) R humerus fx: nearly completely back to normal function with PT (nonoperatively treated).  5) RA--methotrexate as per rheum.  She has f/u with Dr. Amil Amen in about 2 wks and she anticipates labs being done at that time.  I discussed the assessment and treatment plan with the patient. The patient was provided an opportunity to ask questions and all were answered. The patient agreed with the plan and demonstrated an understanding of the instructions.   The patient was advised to call back or seek an in-person evaluation if the symptoms worsen or if the condition fails to improve as anticipated.  F/u: 6 mo RCI + labs and prevnar 13.  She will be getting shingrix #2 and Tdap at pharmacy soon.  Signed:  Crissie Sickles, MD           12/20/2018

## 2018-12-20 NOTE — Telephone Encounter (Signed)
Unfortunately, there are no treatments at all to help with ringing in the ears.  Sorry.

## 2018-12-20 NOTE — Telephone Encounter (Signed)
Patient forgot to ask during virtual visit today if there were any drops she could use for the ringing in her ears. Please contact patient.

## 2018-12-20 NOTE — Telephone Encounter (Signed)
Please advise 

## 2018-12-21 ENCOUNTER — Ambulatory Visit (HOSPITAL_COMMUNITY): Payer: Medicare Other

## 2018-12-21 DIAGNOSIS — S42294A Other nondisplaced fracture of upper end of right humerus, initial encounter for closed fracture: Secondary | ICD-10-CM | POA: Diagnosis not present

## 2018-12-21 NOTE — Telephone Encounter (Signed)
Pt was advised nothing further can be done regarding issue.

## 2018-12-22 ENCOUNTER — Ambulatory Visit (HOSPITAL_COMMUNITY): Payer: Medicare Other

## 2018-12-25 ENCOUNTER — Ambulatory Visit (HOSPITAL_COMMUNITY): Payer: Medicare Other

## 2018-12-26 ENCOUNTER — Ambulatory Visit (HOSPITAL_COMMUNITY): Payer: Medicare Other

## 2018-12-27 ENCOUNTER — Ambulatory Visit (HOSPITAL_COMMUNITY): Payer: Medicare Other

## 2018-12-28 ENCOUNTER — Ambulatory Visit (HOSPITAL_COMMUNITY): Payer: Medicare Other

## 2018-12-29 ENCOUNTER — Ambulatory Visit (HOSPITAL_COMMUNITY): Payer: Medicare Other

## 2019-01-01 ENCOUNTER — Ambulatory Visit (HOSPITAL_COMMUNITY): Payer: Medicare Other

## 2019-01-02 ENCOUNTER — Ambulatory Visit (HOSPITAL_COMMUNITY): Payer: Medicare Other

## 2019-01-03 ENCOUNTER — Ambulatory Visit (HOSPITAL_COMMUNITY): Payer: Medicare Other

## 2019-01-04 ENCOUNTER — Ambulatory Visit (HOSPITAL_COMMUNITY): Payer: Medicare Other

## 2019-01-05 ENCOUNTER — Ambulatory Visit (HOSPITAL_COMMUNITY): Payer: Medicare Other

## 2019-01-08 ENCOUNTER — Ambulatory Visit (HOSPITAL_COMMUNITY): Payer: Medicare Other

## 2019-01-08 DIAGNOSIS — Z79899 Other long term (current) drug therapy: Secondary | ICD-10-CM | POA: Diagnosis not present

## 2019-01-08 DIAGNOSIS — M791 Myalgia, unspecified site: Secondary | ICD-10-CM | POA: Diagnosis not present

## 2019-01-08 DIAGNOSIS — M0589 Other rheumatoid arthritis with rheumatoid factor of multiple sites: Secondary | ICD-10-CM | POA: Diagnosis not present

## 2019-01-09 ENCOUNTER — Ambulatory Visit (HOSPITAL_COMMUNITY): Payer: Medicare Other

## 2019-01-09 ENCOUNTER — Telehealth (HOSPITAL_COMMUNITY): Payer: Self-pay | Admitting: *Deleted

## 2019-01-10 ENCOUNTER — Ambulatory Visit (HOSPITAL_COMMUNITY): Payer: Medicare Other

## 2019-01-11 ENCOUNTER — Ambulatory Visit (HOSPITAL_COMMUNITY): Payer: Medicare Other

## 2019-01-12 ENCOUNTER — Ambulatory Visit (HOSPITAL_COMMUNITY): Payer: Medicare Other

## 2019-01-15 ENCOUNTER — Ambulatory Visit (HOSPITAL_COMMUNITY): Payer: Medicare Other

## 2019-01-17 ENCOUNTER — Ambulatory Visit (HOSPITAL_COMMUNITY): Payer: Medicare Other

## 2019-01-19 ENCOUNTER — Ambulatory Visit (HOSPITAL_COMMUNITY): Payer: Medicare Other

## 2019-01-22 ENCOUNTER — Ambulatory Visit (HOSPITAL_COMMUNITY): Payer: Medicare Other

## 2019-01-23 ENCOUNTER — Other Ambulatory Visit: Payer: Self-pay

## 2019-01-23 MED ORDER — POTASSIUM CHLORIDE ER 8 MEQ PO TBCR: 16.0000 meq | EXTENDED_RELEASE_TABLET | Freq: Every day | ORAL | 6 refills | Status: DC

## 2019-01-24 ENCOUNTER — Other Ambulatory Visit: Payer: Self-pay

## 2019-01-24 ENCOUNTER — Ambulatory Visit (HOSPITAL_COMMUNITY): Payer: Medicare Other

## 2019-01-26 ENCOUNTER — Ambulatory Visit (HOSPITAL_COMMUNITY): Payer: Medicare Other

## 2019-01-29 ENCOUNTER — Ambulatory Visit (HOSPITAL_COMMUNITY): Payer: Medicare Other

## 2019-01-30 ENCOUNTER — Telehealth: Payer: Self-pay | Admitting: Cardiology

## 2019-01-30 NOTE — Telephone Encounter (Signed)
Called patient regarding message received about stopping the medication Brilinta at the end of May. Per Dr. Rosezella Florida office note on 10/09/18 she was advised  to stop the Brilinta at the end of May. Informed patient of these instructions and she verbalized understanding.

## 2019-01-30 NOTE — Telephone Encounter (Signed)
Patient states she had instructions that state to stop taking ticagrelor (BRILINTA) 90 MG TABS tablet at the end of May.  She just wants to make sure she needs to stop taking it.

## 2019-01-31 ENCOUNTER — Ambulatory Visit (HOSPITAL_COMMUNITY): Payer: Medicare Other

## 2019-02-01 ENCOUNTER — Ambulatory Visit: Payer: Medicare Other | Admitting: Family Medicine

## 2019-02-02 ENCOUNTER — Ambulatory Visit (HOSPITAL_COMMUNITY): Payer: Medicare Other

## 2019-02-07 ENCOUNTER — Ambulatory Visit (HOSPITAL_COMMUNITY): Payer: Medicare Other

## 2019-02-09 ENCOUNTER — Ambulatory Visit (HOSPITAL_COMMUNITY): Payer: Medicare Other

## 2019-02-12 ENCOUNTER — Ambulatory Visit (HOSPITAL_COMMUNITY): Payer: Medicare Other

## 2019-02-13 DIAGNOSIS — Z79899 Other long term (current) drug therapy: Secondary | ICD-10-CM | POA: Diagnosis not present

## 2019-02-13 DIAGNOSIS — M0589 Other rheumatoid arthritis with rheumatoid factor of multiple sites: Secondary | ICD-10-CM | POA: Diagnosis not present

## 2019-02-14 ENCOUNTER — Ambulatory Visit (HOSPITAL_COMMUNITY): Payer: Medicare Other

## 2019-02-16 ENCOUNTER — Ambulatory Visit (HOSPITAL_COMMUNITY): Payer: Medicare Other

## 2019-02-19 ENCOUNTER — Ambulatory Visit (HOSPITAL_COMMUNITY): Payer: Medicare Other

## 2019-02-20 ENCOUNTER — Telehealth (HOSPITAL_COMMUNITY): Payer: Self-pay | Admitting: *Deleted

## 2019-02-20 ENCOUNTER — Encounter (HOSPITAL_COMMUNITY): Payer: Self-pay | Admitting: *Deleted

## 2019-02-20 ENCOUNTER — Telehealth: Payer: Self-pay | Admitting: Cardiology

## 2019-02-20 NOTE — Progress Notes (Signed)
  The following notification was sent to Dr. Percival Spanish:  Dr. Percival Spanish,   As you are aware our department remains closed to patients due to Covid-19.  We are excited to be able to offer an alternative to traditional onsite Cardiac Rehab while your patient continues to follow Re-Open guidelines.  This is a notification that your patient has been contacted and is very interested in participating in Virtual Cardiac Rehab.  Thank you for your continued support in helping [place] meet the health care needs of our patients.   Cherre Huger, BSN Cardiac and Engineer, petroleum

## 2019-02-20 NOTE — Telephone Encounter (Signed)
Returned call to patient of Dr. Percival Spanish.   She had cath 08/08/18. Cath report & discharge summary recommends dual antiplatelet therapy for 1 year. Patient was advised to stop Brilinta at the end of May. She wants clarification on this and also wants to know how long she should continue imdur and potassium   Routed to MD

## 2019-02-20 NOTE — Telephone Encounter (Signed)
OK to stop Brilinta.  Other meds will continue as ordered.

## 2019-02-20 NOTE — Telephone Encounter (Signed)
Patient wanted to clarify what Dr. Percival Spanish wants to do in regards to her Brilinta.  She was put on the medication in the hospital, and was told to take the medication for 3 months. She is aware that those are hospital orders. She would like Dr. Rosezella Florida opinion about what medication to keep taking.   She has been off the Brilinta for 2 days now. She really would just like Dr. Rosezella Florida opinion

## 2019-02-20 NOTE — Telephone Encounter (Signed)
         Confirm Consent - In the setting of the current Covid19 crisis, you are scheduled for a phone visit with your Cardiac or Pulmonary team member.  Just as we do with many in-gym visits, in order for you to participate in this visit, we must obtain consent.  If you'd like, I can send this to your mychart (if signed up) or email for you to review.  Otherwise, I can obtain your verbal consent now.  By agreeing to a telephone visit, we'd like you to understand that the technology does not allow for your Cardiac or Pulmonary Rehab team member to perform a physical assessment, and thus may limit their ability to fully assess your ability to perform exercise programs. If your provider identifies any concerns that need to be evaluated in person, we will make arrangements to do so.  Finally, though the technology is pretty good, we cannot assure that it will always work on either your or our end and we cannot ensure that we have a secure connection.  Cardiac and Pulmonary Rehab Telehealth visits and "At Home" cardiac and pulmonary rehab are provided at no cost to you.        Are you willing to proceed?"        STAFF: Did the patient verbally acknowledge consent to telehealth visit? Document YES/NO here: Yes    Barnet Pall RN  Cardiac and Pulmonary Rehab Staff        June 9,2020   11:35

## 2019-02-21 ENCOUNTER — Telehealth (HOSPITAL_COMMUNITY): Payer: Self-pay | Admitting: *Deleted

## 2019-02-21 ENCOUNTER — Other Ambulatory Visit: Payer: Self-pay

## 2019-02-21 ENCOUNTER — Encounter (HOSPITAL_COMMUNITY)
Admission: RE | Admit: 2019-02-21 | Discharge: 2019-02-21 | Disposition: A | Discharge status: Home / Self Care | Payer: Self-pay | Source: Ambulatory Visit | Attending: Cardiology | Admitting: Cardiology

## 2019-02-21 ENCOUNTER — Ambulatory Visit (HOSPITAL_COMMUNITY): Payer: Medicare Other

## 2019-02-21 NOTE — Telephone Encounter (Signed)
Patient called with MD advice. Med list updated 

## 2019-02-21 NOTE — Telephone Encounter (Signed)
Pt is interested in participating in Virtual Cardiac Rehab. Pt advised that Virtual Cardiac Rehab is provided at no cost to the patient.  Checklist:  1. Pt has smart device  ie smartphone and/or ipad for downloading an app  Yes 2. Reliable internet/wifi service    Yes 3. Understands how to use their smartphone and navigate within an app.  Yes 4.  Reviewed with pt the scheduling process for virtual cardiac rehab.  Pt verbalized understanding. Called and spoke to pt regarding Virtual Cardiac Rehab.  Pt  was able to download the [facility] app on their smart device with no issues. Pt set up their account and received the following welcome message -"Welcome to the [facility] and Pulmonary Rehabilitation program. We hope that you will find the exercise program beneficial in your recovery process. Our staff is available to assist with any questions/concerns about your exercise routine. Best wishes". Brief orientation provided to with the advisement to watch the "Intro to Rehab" series located under the Resource tab. Pt verbalized understanding. Will continue to follow and monitor pt progress with feedback as needed.Barnet Pall, RN,BSN 02/21/2019 10:44 AM

## 2019-02-23 ENCOUNTER — Ambulatory Visit (HOSPITAL_COMMUNITY): Payer: Medicare Other

## 2019-02-26 ENCOUNTER — Ambulatory Visit (HOSPITAL_COMMUNITY): Payer: Medicare Other

## 2019-02-27 ENCOUNTER — Telehealth: Payer: Self-pay | Admitting: Family Medicine

## 2019-02-27 NOTE — Telephone Encounter (Signed)
Copied from [facility] [phone]. Topic: Quick Communication - Rx Refill/Question >> Feb 26, 2019  3:46 PM Bonnie [name] wrote: Medication: Prevagen - Pt wants to know if Dr. Anitra Lauth thinks it would be ok for her and her husband(Bonnie [name] 6.25.40) to take Prevagen to help with maintaining memory / Pt also asked if there was a prescription that Dr. Anitra Lauth can prescribe / please advise  Forwarding to [facility] clinical team

## 2019-02-27 NOTE — Telephone Encounter (Signed)
Pt has not been on the medication before.   Please advise, thanks.

## 2019-02-27 NOTE — Telephone Encounter (Signed)
I recommend that they don't waste there money on this med. Also, no rx med is indicated to "maintain" memory. The best things that can be done are NONMEDICAL-->reading, doing puzzles, having conversations, playing games, and exercising.-thx

## 2019-02-27 NOTE — Telephone Encounter (Signed)
SW pt and advised PCP recommendations, pt has already bought medication. She had 1st shingles vaccine on 11/11/18, [facility] and due for 2nd one but told by pharmacy tech "government will not allow them to get it".  Please advise, thanks.

## 2019-02-28 ENCOUNTER — Other Ambulatory Visit: Payer: Self-pay

## 2019-02-28 ENCOUNTER — Ambulatory Visit (HOSPITAL_COMMUNITY): Payer: Medicare Other

## 2019-02-28 MED ORDER — LORAZEPAM 0.5 MG PO TABS: ORAL_TABLET | ORAL | 1 refills | Status: DC

## 2019-02-28 MED ORDER — LEVOTHYROXINE SODIUM 100 MCG PO TABS: 100.0000 ug | ORAL_TABLET | Freq: Every day | ORAL | 1 refills | Status: DC

## 2019-02-28 NOTE — Telephone Encounter (Signed)
I'll do [place]. We'll have to get her to sign a CSC at her f/u visit 06/2019.

## 2019-02-28 NOTE — Telephone Encounter (Signed)
RF request for Lorazepam LOV: 12/20/18 Next ov: 06/21/19 Last written: 08/18/18(60,1) No CSC or UDS  Please advise, thanks. Medication pending. PMP aware printed.

## 2019-02-28 NOTE — Telephone Encounter (Signed)
I have no idea what the pharmacy is talking about, but she can come here for shingrix #2, nurse visit.-thx

## 2019-03-01 NOTE — Telephone Encounter (Signed)
Noted  

## 2019-03-01 NOTE — Telephone Encounter (Signed)
Patient would like to make Dr Anitra Lauth aware that in about one week they are leaving to stay near their children in [place] for about 6 months.

## 2019-03-01 NOTE — Telephone Encounter (Signed)
Contacted patient.  [facility] not giving [facility] due to [facility].  Medicare won't pay for Shingrix. [facility] is giving immunizations. Patient aware and will get 2nd Shingrix there.

## 2019-03-02 ENCOUNTER — Encounter: Payer: Self-pay | Admitting: Family Medicine

## 2019-03-02 ENCOUNTER — Ambulatory Visit (HOSPITAL_COMMUNITY): Payer: Medicare Other

## 2019-03-05 ENCOUNTER — Ambulatory Visit (HOSPITAL_COMMUNITY): Payer: Medicare Other

## 2019-03-05 NOTE — Telephone Encounter (Signed)
No further pneumovax 23 is needed for her. She does need prevnar 13 but this can be done here at any future follow up visit.-thx

## 2019-03-07 ENCOUNTER — Ambulatory Visit (HOSPITAL_COMMUNITY): Payer: Medicare Other

## 2019-03-09 ENCOUNTER — Ambulatory Visit (HOSPITAL_COMMUNITY): Payer: Medicare Other

## 2019-03-12 ENCOUNTER — Ambulatory Visit (HOSPITAL_COMMUNITY): Payer: Medicare Other

## 2019-03-14 ENCOUNTER — Ambulatory Visit (HOSPITAL_COMMUNITY): Payer: Medicare Other

## 2019-03-19 ENCOUNTER — Ambulatory Visit (HOSPITAL_COMMUNITY): Payer: Medicare Other

## 2019-03-21 ENCOUNTER — Ambulatory Visit (HOSPITAL_COMMUNITY): Payer: Medicare Other

## 2019-03-22 ENCOUNTER — Telehealth (HOSPITAL_COMMUNITY): Payer: Self-pay | Admitting: *Deleted

## 2019-03-22 NOTE — Telephone Encounter (Signed)
Called to discuss why patient has not logged any exercise in 20 days in the Virtual Cardiac Rehab program. She lives 6 months/year in [place] and 6 months in [place] and just moved to her [place] home.  She would like to be discharged from the virtual Cardiac Rehab program at this time due to the chaos of moving.  She will continue walking for exercise.

## 2019-03-23 ENCOUNTER — Ambulatory Visit (HOSPITAL_COMMUNITY): Payer: Medicare Other

## 2019-03-26 ENCOUNTER — Ambulatory Visit (HOSPITAL_COMMUNITY): Payer: Medicare Other

## 2019-03-28 ENCOUNTER — Ambulatory Visit (HOSPITAL_COMMUNITY): Payer: Medicare Other

## 2019-04-20 DIAGNOSIS — U071 COVID-19: Secondary | ICD-10-CM | POA: Diagnosis not present

## 2019-04-20 DIAGNOSIS — Z20828 Contact with and (suspected) exposure to other viral communicable diseases: Secondary | ICD-10-CM | POA: Diagnosis not present

## 2019-04-20 DIAGNOSIS — R110 Nausea: Secondary | ICD-10-CM | POA: Diagnosis not present

## 2019-04-26 NOTE — Progress Notes (Signed)
Cardiology Office Note   Date:  04/27/2019   ID:  Bonnie [name], DOB [birth year], MRN [id number]  PCP:  Tammi Sou, MD  Cardiologist:   Minus Breeding, MD  Referring:  Tammi Sou, MD  Chief Complaint  Patient presents with  . Coronary Artery Disease     History of Present Illness: Bonnie [name] is a 78 y.o. female who presents for evaluation ochest pain.  She was in the hospital in 08/06/2018 describing heavy pressure midsternal with associated nausea. EKG in ED was abnormal with inverted T-wave in Lead III.    She had cardiac cath on 08/08/2018 revealing 2 vessel disease with 60-65 % in the mLAD, and mRCA with an abrupt cut off in the mid posterior lateral vessel which was a small caliber vessel and suspected to be the culprit vessel with normal L Cx. She did not require intervention but was treated with DAPT with ASA and Brilinta, with aggressive lipid lowering recommended with increased dose of Crestor.She was noted to have moderate MR and mild AI on echo with normal LV function .    She returns for follow up.  She is anxious but she is been doing well since I last saw her.  She and her husband are thinking of moving at least part-time to [place].  They have an apartment down there.  They still have their house up here.  She has not had any new cardiovascular symptoms.  She is not having any chest pressure, neck or arm discomfort.  She is not having any new palpitations, presyncope or syncope.  She has had no weight gain or edema.   Past Medical History:  Diagnosis Date  . Beta thalassemia, heterozygous    ?alpha? pt does not recall.  Marland Kitchen CAD (coronary artery disease) 07/2018   NSTEMI (small vessel occlusion, not amenable to intervention)-->2V CAD on cath, preserved LV systolic fxn.  . Diastolic dysfunction 40/0867  . Fatigue   . GERD (gastroesophageal reflux disease)   . Hemorrhoids   . History of adenomatous polyp of colon   . Hyperlipidemia   .  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  Surgical path NEG for atypica or malignancy.  . NSTEMI (non-ST elevated myocardial infarction) ([place]) 07/2018   Brilinta added to ASA-->brilinta to be discontinued after 6 months per Dr. Percival Spanish (this will be the end of May 2020)  . Palpitations    08/2018 Event monitor normal.  . Rheumatoid arthritis ([place]) 2007   muscle weaknes at times (Dr. Amil Amen)    Past Surgical History:  Procedure Laterality Date  . ABDOMINAL HYSTERECTOMY  1987   DUB, no malignancy.  Ovaries still in.  Marland Kitchen BLADDER REPAIR    . Cardiac Event Monitor  08/22/2018   NO arrhythmias.  Her symptomatic periods coresponded to NSR.  Marland Kitchen CHOLECYSTECTOMY  1969  . COLONOSCOPY  11/07/2017   2014 adenoma.  Rpt 10/2017 adenoma x 1.  No further colonoscopies needed per GI.   Marland Kitchen LEFT HEART CATH AND CORONARY ANGIOGRAPHY N/A 08/08/2018   Procedure: LEFT HEART CATH AND CORONARY ANGIOGRAPHY;  Surgeon: Troy Sine, MD;  Location: [facility];  Service: Cardiovascular;  Laterality: N/A;  . THYROIDECTOMY  2012   cytologic atypia on nodule bx; surgical path NEG for atypica or malignancy.  . TRANSTHORACIC ECHOCARDIOGRAM  08/07/2018   EF  60-65%, no wall motion abnl, grd II DD, mod MR.  Marland Kitchen UPPER GASTROINTESTINAL ENDOSCOPY    . VAGINAL PROLAPSE REPAIR  2012     Current Outpatient Medications  Medication Sig Dispense Refill  . acetaminophen (TYLENOL) 500 MG tablet Take 500 mg by mouth every 6 (six) hours as needed for headache (pain).    Marland Kitchen aspirin EC 81 MG tablet Take 81 mg by mouth at bedtime.    . Calcium Citrate-Vitamin D (CALCIUM CITRATE + D PO) Take 1 tablet by mouth 2 (two) times daily.     . celecoxib (CELEBREX) 200 MG capsule Take by mouth daily.    . chlorhexidine (PERIDEX) 0.12 % solution     . Cholecalciferol (VITAMIN  D3) 25 MCG (1000 UT) CAPS Take 1 capsule by mouth daily.    . Coenzyme Q10 (COQ10 PO) Take by mouth.    . Cyanocobalamin (B-12) 500 MCG TABS Take 500 mcg by mouth daily.     . folic acid (FOLVITE) 1 MG tablet Take 1 mg by mouth 2 (two) times daily.     Marland Kitchen HYDROcodone-acetaminophen (NORCO/VICODIN) 5-325 MG tablet Take 1-2 tablets by mouth every 4 (four) hours as needed for moderate pain or severe pain. 30 tablet 0  . isosorbide mononitrate (IMDUR) 60 MG 24 hr tablet Take 1 tablet (60 mg total) by mouth daily. 90 tablet 3  . levothyroxine (SYNTHROID) 100 MCG tablet Take 1 tablet (100 mcg total) by mouth daily before breakfast. 90 tablet 1  . LORazepam (ATIVAN) 0.5 MG tablet 1-2 tabs po bid prn anxiety 60 tablet 1  . Magnesium 125 MG CAPS Take 1 capsule by mouth daily.    . methotrexate 2.5 MG tablet Take 12.5 mg by mouth every Sunday.     . Multiple Vitamin (MULTIVITAMIN WITH MINERALS) TABS tablet Take 1 tablet by mouth daily.    . nitroGLYCERIN (NITROSTAT) 0.4 MG SL tablet Place 1 tablet (0.4 mg total) under the tongue every 5 (five) minutes x 3 doses as needed for chest pain. 25 tablet 3  . potassium chloride (KLOR-CON) 8 MEQ tablet Take 2 tablets (16 mEq total) by mouth daily. 60 tablet 6  . rosuvastatin (CRESTOR) 40 MG tablet Take 1 tablet (40 mg total) by mouth daily at 6 PM. 90 tablet 3  . FISH OIL-KRILL OIL PO Take by mouth.     No current facility-administered medications for this visit.     Allergies:   Neosporin [neomycin-bacitracin zn-polymyx], Seasonal ic [cholestatin], Triamcinolone, Atorvastatin, Monascus purpureus went yeast, and Pravastatin    ROS:  Please see the history of present illness.   Otherwise, review of systems are positive for none.   All other systems are reviewed and negative.    PHYSICAL EXAM: VS:  BP 130/74   Pulse 62   Temp (!) 97.3 F (36.3 C)   Ht 5\' 7"  (1.702 m)   Wt 141 lb 12.8 oz (64.3 kg)   SpO2 99%   BMI 22.21 kg/m  , BMI Body mass index is 22.21  kg/m. GENERAL:  Well appearing NECK:  No jugular venous distention, waveform within normal limits, carotid upstroke brisk and symmetric, no bruits, no thyromegaly LUNGS:  Clear to auscultation bilaterally CHEST:  Unremarkable HEART:  PMI not displaced or sustained,S1 and S2 within normal limits, no S3, no S4, no clicks, no rubs, no murmurs ABD:  Flat, positive bowel sounds normal in frequency in pitch, no bruits, no rebound, no guarding, no midline pulsatile mass, no hepatomegaly, no splenomegaly  EXT:  2 plus pulses throughout, no edema, no cyanosis no clubbing  EKG:  EKG is not ordered today.   Recent Labs: 08/07/2018: TSH 0.439 08/14/2018: Platelets 272 08/29/2018: Magnesium 2.4 09/08/2018: BUN 16; Creatinine, Ser 0.70; Hemoglobin 10.9; Potassium 3.6; Sodium 139    Lipid Panel    Component Value Date/Time   CHOL 112 08/08/2018 0503   TRIG 54 08/08/2018 0503   HDL 51 08/08/2018 0503   CHOLHDL 2.2 08/08/2018 0503   VLDL 11 08/08/2018 0503   LDLCALC 50 08/08/2018 0503   LDLDIRECT 155.5 12/04/2012 1123     Lab Results  Component Value Date   TSH 0.439 08/07/2018     Wt Readings from Last 3 Encounters:  04/27/19 141 lb 12.8 oz (64.3 kg)  12/20/18 150 lb (68 kg)  11/09/18 142 lb (64.4 kg)      Other studies Reviewed: Additional studies/ records that were reviewed today include: ED records from Dec. Review of the above records demonstrates:    See elsewhere   ASSESSMENT AND PLAN:  CAD:   The patient has no new sypmtoms.  No further cardiovascular testing is indicated.  We will continue with aggressive risk reduction and meds as listed.  MR:  This was moderate on echo.  I would see her back in 1 year and follow the echo at that time.   AI:  This was mild.  I will follow this clinically.   PALPITATIONS:   She has no significant arrhythmias.  No change in therapy.   DIZZINESS:   She is not describing this.  No change in therapy.   DYSLIPIDEMIA: LDL was 81 and HDL  63 in October.  This is a reasonable ratio and so I will continue the meds as listed.  Current medicines are reviewed at length with the patient today.  The patient does not have concerns regarding medicines.  The following changes have been made:  None  Labs/ tests ordered today include:   None  No orders of the defined types were placed in this encounter.    Disposition:   FU with me in one year.    Signed, Minus Breeding, MD  04/27/2019 5:35 PM    [facility]

## 2019-04-27 ENCOUNTER — Other Ambulatory Visit: Payer: Self-pay

## 2019-04-27 ENCOUNTER — Ambulatory Visit (INDEPENDENT_AMBULATORY_CARE_PROVIDER_SITE_OTHER): Payer: Medicare Other | Admitting: Cardiology

## 2019-04-27 ENCOUNTER — Encounter: Payer: Self-pay | Admitting: Cardiology

## 2019-04-27 VITALS — BP 130/74 | HR 62 | Temp 97.3°F | Ht 67.0 in | Wt 141.8 lb

## 2019-04-27 DIAGNOSIS — E785 Hyperlipidemia, unspecified: Secondary | ICD-10-CM | POA: Diagnosis not present

## 2019-04-27 DIAGNOSIS — I2583 Coronary atherosclerosis due to lipid rich plaque: Secondary | ICD-10-CM | POA: Diagnosis not present

## 2019-04-27 DIAGNOSIS — R002 Palpitations: Secondary | ICD-10-CM

## 2019-04-27 DIAGNOSIS — I2510 Atherosclerotic heart disease of native coronary artery without angina pectoris: Secondary | ICD-10-CM

## 2019-04-27 DIAGNOSIS — I340 Nonrheumatic mitral (valve) insufficiency: Secondary | ICD-10-CM | POA: Diagnosis not present

## 2019-04-27 NOTE — Patient Instructions (Signed)

## 2019-05-11 DIAGNOSIS — R413 Other amnesia: Secondary | ICD-10-CM | POA: Diagnosis not present

## 2019-05-11 DIAGNOSIS — I252 Old myocardial infarction: Secondary | ICD-10-CM | POA: Diagnosis not present

## 2019-05-11 DIAGNOSIS — E039 Hypothyroidism, unspecified: Secondary | ICD-10-CM | POA: Diagnosis not present

## 2019-05-11 DIAGNOSIS — E7800 Pure hypercholesterolemia, unspecified: Secondary | ICD-10-CM | POA: Diagnosis not present

## 2019-05-11 DIAGNOSIS — M069 Rheumatoid arthritis, unspecified: Secondary | ICD-10-CM | POA: Diagnosis not present

## 2019-05-11 DIAGNOSIS — Z1231 Encounter for screening mammogram for malignant neoplasm of breast: Secondary | ICD-10-CM | POA: Diagnosis not present

## 2019-05-11 DIAGNOSIS — Z13820 Encounter for screening for osteoporosis: Secondary | ICD-10-CM | POA: Diagnosis not present

## 2019-05-11 DIAGNOSIS — I2510 Atherosclerotic heart disease of native coronary artery without angina pectoris: Secondary | ICD-10-CM | POA: Diagnosis not present

## 2019-05-11 DIAGNOSIS — Z79899 Other long term (current) drug therapy: Secondary | ICD-10-CM | POA: Diagnosis not present

## 2019-05-11 DIAGNOSIS — Z1211 Encounter for screening for malignant neoplasm of colon: Secondary | ICD-10-CM | POA: Diagnosis not present

## 2019-05-11 DIAGNOSIS — I10 Essential (primary) hypertension: Secondary | ICD-10-CM | POA: Diagnosis not present

## 2019-05-11 DIAGNOSIS — Z23 Encounter for immunization: Secondary | ICD-10-CM | POA: Diagnosis not present

## 2019-05-16 DIAGNOSIS — E039 Hypothyroidism, unspecified: Secondary | ICD-10-CM | POA: Diagnosis not present

## 2019-05-16 DIAGNOSIS — E7800 Pure hypercholesterolemia, unspecified: Secondary | ICD-10-CM | POA: Diagnosis not present

## 2019-05-16 DIAGNOSIS — D649 Anemia, unspecified: Secondary | ICD-10-CM | POA: Diagnosis not present

## 2019-05-16 DIAGNOSIS — Z79899 Other long term (current) drug therapy: Secondary | ICD-10-CM | POA: Diagnosis not present

## 2019-05-17 DIAGNOSIS — Z1211 Encounter for screening for malignant neoplasm of colon: Secondary | ICD-10-CM | POA: Diagnosis not present

## 2019-05-25 DIAGNOSIS — D649 Anemia, unspecified: Secondary | ICD-10-CM | POA: Diagnosis not present

## 2019-06-21 ENCOUNTER — Ambulatory Visit: Payer: Medicare Other | Admitting: Family Medicine

## 2019-06-28 DIAGNOSIS — E039 Hypothyroidism, unspecified: Secondary | ICD-10-CM | POA: Diagnosis not present

## 2019-06-28 DIAGNOSIS — R809 Proteinuria, unspecified: Secondary | ICD-10-CM | POA: Diagnosis not present

## 2019-06-28 DIAGNOSIS — R7989 Other specified abnormal findings of blood chemistry: Secondary | ICD-10-CM | POA: Diagnosis not present

## 2019-06-28 DIAGNOSIS — N289 Disorder of kidney and ureter, unspecified: Secondary | ICD-10-CM | POA: Diagnosis not present

## 2019-07-15 ENCOUNTER — Encounter: Payer: Self-pay | Admitting: Family Medicine

## 2019-07-19 ENCOUNTER — Other Ambulatory Visit: Payer: Self-pay | Admitting: *Deleted

## 2019-07-19 MED ORDER — ROSUVASTATIN CALCIUM 40 MG PO TABS: 40.0000 mg | ORAL_TABLET | Freq: Every day | ORAL | 2 refills | Status: DC

## 2019-07-19 NOTE — Telephone Encounter (Signed)
Rx has been sent to the pharmacy electronically. ° °

## 2019-07-20 ENCOUNTER — Other Ambulatory Visit: Payer: Self-pay | Admitting: Adult Health

## 2019-09-14 DIAGNOSIS — H9313 Tinnitus, bilateral: Secondary | ICD-10-CM

## 2019-09-14 HISTORY — DX: Tinnitus, bilateral: H93.13

## 2019-10-05 ENCOUNTER — Ambulatory Visit: Payer: Medicare Other | Attending: Internal Medicine

## 2019-10-05 DIAGNOSIS — Z23 Encounter for immunization: Secondary | ICD-10-CM | POA: Insufficient documentation

## 2019-10-05 NOTE — Progress Notes (Signed)
   Covid-19 Vaccination Clinic  Name:  NIYOMI PARK    MRN: [id number] DOB: [birth year]  10/05/2019  Ms. Zanni was observed post Covid-19 immunization for 15 minutes without incidence. She was provided with Vaccine Information Sheet and instruction to access the V-Safe system.   Ms. Polich was instructed to call 911 with any severe reactions post vaccine: Marland Kitchen Difficulty breathing  . Swelling of your face and throat  . A fast heartbeat  . A bad rash all over your body  . Dizziness and weakness    Immunizations Administered    Name Date Dose VIS Date Route   Pfizer COVID-19 Vaccine 10/05/2019  9:41 AM 0.3 mL 08/24/2019 Intramuscular   Manufacturer: [place]   Lot: [id number]   [place]: [id number]

## 2019-10-12 ENCOUNTER — Other Ambulatory Visit: Payer: Self-pay

## 2019-10-12 ENCOUNTER — Encounter: Payer: Self-pay | Admitting: Family Medicine

## 2019-10-12 ENCOUNTER — Ambulatory Visit (INDEPENDENT_AMBULATORY_CARE_PROVIDER_SITE_OTHER): Payer: Medicare Other | Admitting: Family Medicine

## 2019-10-12 ENCOUNTER — Telehealth: Payer: Self-pay

## 2019-10-12 VITALS — BP 122/80 | HR 60 | Temp 98.5°F | Resp 16 | Ht 67.0 in | Wt 142.8 lb

## 2019-10-12 DIAGNOSIS — M7741 Metatarsalgia, right foot: Secondary | ICD-10-CM | POA: Diagnosis not present

## 2019-10-12 DIAGNOSIS — E7800 Pure hypercholesterolemia, unspecified: Secondary | ICD-10-CM | POA: Diagnosis not present

## 2019-10-12 DIAGNOSIS — E039 Hypothyroidism, unspecified: Secondary | ICD-10-CM

## 2019-10-12 DIAGNOSIS — I2510 Atherosclerotic heart disease of native coronary artery without angina pectoris: Secondary | ICD-10-CM | POA: Diagnosis not present

## 2019-10-12 DIAGNOSIS — R413 Other amnesia: Secondary | ICD-10-CM

## 2019-10-12 MED ORDER — LEVOTHYROXINE SODIUM 88 MCG PO TABS: 88.0000 ug | ORAL_TABLET | Freq: Every day | ORAL | Status: DC

## 2019-10-12 NOTE — Telephone Encounter (Signed)
Patient found an error in her medication list.  Please update.   levothyroxine (SYNTHROID) 100 MCG tablet [id number]   She is on 88 MCG

## 2019-10-12 NOTE — Patient Instructions (Signed)
Get a metatarsal pad at any department/shoe store/pharmacy. Wear wide-toed shoes as much as possible.

## 2019-10-12 NOTE — Telephone Encounter (Signed)
Pt's med list updated.

## 2019-10-12 NOTE — Progress Notes (Signed)
OFFICE VISIT  10/12/2019   CC:  Chief Complaint  Patient presents with  . Foot Pain    right foot, sore x 1 month, she has tried soaking it in epsom salt for 1-2 times daily for 15 minutes.    HPI:    Patient is a 79 y.o. Caucasian female who presents for foot complaint. Onset 6 mo ago, hurting daily constantly.  Soaking in epsom salts daily has helped. No trauma prior.  Has had this on and off over the years, usually worse after lots of walking and slow to get better sometimes.  Used to see podiatrist but she retired, pt states surgery was discussed in the past for her foot problem but she doesn't recall specifics and she felt like it was being too aggressive for a prob that is not that debilitating.  Her son wanted her to tell me about memory problems: son told her she seems to tell him the same stories and ask same questions over and over. She does not notice anything.  No one else has said anything.  She functions well, take care of her husband Vinnie who has some aphasia problems as CVA residual.    She went to her son's doctor when she visited him out of state for 6 mo recently. Her thyroid level was checked and she was told to decrease her dose to 88 mcg qd.  ROS: no CP, no SOB, no wheezing, no cough, no dizziness, no HAs, no rashes, no melena/hematochezia.  No polyuria or polydipsia.  No myalgias.  No tremor, no focal weakness, no gait abnormality, no falls, no depression.   Past Medical History:  Diagnosis Date  . Beta thalassemia, heterozygous    ?alpha? pt does not recall.  Marland Kitchen CAD (coronary artery disease) 07/2018   NSTEMI (small vessel occlusion, not amenable to intervention)-->2V CAD on cath, preserved LV systolic fxn.  . Diastolic dysfunction [id number]  . Fatigue   . GERD (gastroesophageal reflux disease)   . Hemorrhoids   . History of adenomatous polyp of colon   . Hyperlipidemia   . 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  Surgical path NEG for atypica or malignancy.  . NSTEMI (non-ST elevated myocardial infarction) ([place]) 07/2018   Dr. Percival Spanish  . Palpitations    08/2018 Event monitor normal.  . Rheumatoid arthritis ([place]) 2007   muscle weaknes at times (Dr. Amil Amen)    Past Surgical History:  Procedure Laterality Date  . ABDOMINAL HYSTERECTOMY  1987   DUB, no malignancy.  Ovaries still in.  Marland Kitchen BLADDER REPAIR    . Cardiac Event Monitor  08/22/2018   NO arrhythmias.  Her symptomatic periods coresponded to NSR.  Marland Kitchen CHOLECYSTECTOMY  1969  . COLONOSCOPY  11/07/2017   2014 adenoma.  Rpt 10/2017 adenoma x 1.  No further colonoscopies needed per GI.   Marland Kitchen LEFT HEART CATH AND CORONARY ANGIOGRAPHY N/A 08/08/2018   Procedure: LEFT HEART CATH AND CORONARY ANGIOGRAPHY;  Surgeon: Troy Sine, MD;  Location: [facility];  Service: Cardiovascular;  Laterality: N/A;  . THYROIDECTOMY  2012   cytologic atypia on nodule bx; surgical path NEG for atypica or malignancy.  . TRANSTHORACIC ECHOCARDIOGRAM  08/07/2018   EF 60-65%, no wall motion abnl, grd II DD, mod MR.  Marland Kitchen UPPER GASTROINTESTINAL ENDOSCOPY    . VAGINAL PROLAPSE REPAIR  2012  Outpatient Medications Prior to Visit  Medication Sig Dispense Refill  . acetaminophen (TYLENOL) 500 MG tablet Take 500 mg by mouth every 6 (six) hours as needed for headache (pain).    Marland Kitchen aspirin EC 81 MG tablet Take 81 mg by mouth at bedtime.    . Calcium Citrate-Vitamin D (CALCIUM CITRATE + D PO) Take 1 tablet by mouth 2 (two) times daily.     . celecoxib (CELEBREX) 200 MG capsule Take by mouth daily.    . Cholecalciferol (VITAMIN D3) 25 MCG (1000 UT) CAPS Take 1 capsule by mouth daily.    . Coenzyme Q10 (COQ10 PO) Take by mouth.    Marland Kitchen FISH OIL-KRILL OIL PO Take by mouth.    . folic acid (FOLVITE) 1 MG tablet Take 1 mg by mouth 2 (two) times daily.      . isosorbide mononitrate (IMDUR) 60 MG 24 hr tablet Take 1 tablet (60 mg total) by mouth daily. 90 tablet 3  . levothyroxine (SYNTHROID) 100 MCG tablet Take 1 tablet (100 mcg total) by mouth daily before breakfast. 90 tablet 1  . methotrexate 2.5 MG tablet Take 12.5 mg by mouth every Sunday.     . Multiple Vitamin (MULTIVITAMIN WITH MINERALS) TABS tablet Take 1 tablet by mouth daily.    . potassium chloride (KLOR-CON) 8 MEQ tablet TAKE 2 TABLETS (16 MEQ TOTAL) BY MOUTH DAILY. 180 tablet 2  . rosuvastatin (CRESTOR) 40 MG tablet Take 1 tablet (40 mg total) by mouth daily at 6 PM. 90 tablet 2  . chlorhexidine (PERIDEX) 0.12 % solution     . HYDROcodone-acetaminophen (NORCO/VICODIN) 5-325 MG tablet Take 1-2 tablets by mouth every 4 (four) hours as needed for moderate pain or severe pain. (Patient not taking: Reported on 10/12/2019) 30 tablet 0  . LORazepam (ATIVAN) 0.5 MG tablet 1-2 tabs po bid prn anxiety (Patient not taking: Reported on 10/12/2019) 60 tablet 1  . nitroGLYCERIN (NITROSTAT) 0.4 MG SL tablet Place 1 tablet (0.4 mg total) under the tongue every 5 (five) minutes x 3 doses as needed for chest pain. (Patient not taking: Reported on 10/12/2019) 25 tablet 3  . Cyanocobalamin (B-12) 500 MCG TABS Take 500 mcg by mouth daily.     . Magnesium 125 MG CAPS Take 1 capsule by mouth daily.     No facility-administered medications prior to visit.    Allergies  Allergen Reactions  . Neosporin [Neomycin-Bacitracin Zn-Polymyx] Other (See Comments)    Patient states that wounds or scratches never heal. (pt uses bacitracin)   . Seasonal Ic [Cholestatin] Other (See Comments)    Leg cramps  . Triamcinolone Other (See Comments)    Burning sensation  . Atorvastatin Other (See Comments)    Leg cramps  . Monascus Purpureus Went Yeast Other (See Comments)    "Red Yeast Rice" causes leg cramps  . Pravastatin Other (See Comments)    Leg cramps    ROS As per HPI  PE: Blood pressure 122/80, pulse 60,  temperature 98.5 F (36.9 C), temperature source Temporal, resp. rate 16, height 5\' 7"  (1.702 m), weight 142 lb 12.8 oz (64.8 kg), SpO2 98 %. Body mass index is 22.37 kg/m.  Gen: Alert, well appearing.  Patient is oriented to person, place, time, and situation. AFFECT: pleasant, lucid thought and speech. CV: RRR, no m/r/g.   LUNGS: CTA bilat, nonlabored resps, good aeration in all lung fields. EXT: no clubbing or cyanosis.  no edema.  R foot with normal arch.  She has  mild hammertoe deformity of 3rd toe, with mild prominence of 3rd MTP joint when palpating plantar aspect of foot, with focal tenderness at this region.    LABS:    Chemistry      Component Value Date/Time   NA 139 09/08/2018 0624   NA 141 08/29/2018 0000   K 3.6 09/08/2018 0624   CL 109 09/08/2018 0624   CO2 20 08/29/2018 0000   BUN 16 09/08/2018 0624   BUN 18 08/29/2018 0000   CREATININE 0.70 09/08/2018 0624      Component Value Date/Time   CALCIUM 9.5 08/29/2018 0000   ALKPHOS 56 12/04/2012 1123   AST 25 12/04/2012 1123   ALT 30 12/04/2012 1123   BILITOT 0.7 12/04/2012 1123     Lab Results  Component Value Date   HGBA1C 5.7 (H) 08/07/2018   Lab Results  Component Value Date   TSH 0.439 08/07/2018   Lab Results  Component Value Date   CHOL 112 08/08/2018   HDL 51 08/08/2018   LDLCALC 50 08/08/2018   LDLDIRECT 155.5 12/04/2012   TRIG 54 08/08/2018   CHOLHDL 2.2 08/08/2018   Lab Results  Component Value Date   WBC 7.5 08/14/2018   HGB 10.9 (L) 09/08/2018   HCT 32.0 (L) 09/08/2018   MCV 95.5 08/14/2018   PLT 272 08/14/2018   Lab Results  Component Value Date   IRON 67 06/14/2012   Lab Results  Component Value Date   VITAMINB12 793 06/28/2007    IMPRESSION AND PLAN:  1) 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 Recommended metatarsal pad insert and wide toed shoes. She can request new podiatrist referral at any time.  2) CAD, asymptomatic.  Continue statin, ASA.  HR too  low for BB. CMET, FLP today.  3) Hyperlipidemia: tolerating statin. FLP and hepatic panel today (not fasting).  4) Hypothyroidism: apparently TSH a bit low at her son's MD out of state in last few months. Dose was decreased to 88 mcg qd. Recheck TSH today.  5) Memory complaint: no red flags to signal onset of significant memory impairment/cognitive impairment at this time.  Reassured.  She had #1 of covid vaccine, pfizer.   An After Visit Summary was printed and given to the patient.  FOLLOW UP: Return in about 6 months (around 04/10/2020) for routine chronic illness f/u.  Signed:  Crissie Sickles, MD           10/12/2019

## 2019-10-13 LAB — LIPID PANEL
Cholesterol: 133 mg/dL (ref <200)
HDL: 67 mg/dL (ref >=50)
LDL Cholesterol (Calc): 52 mg/dL (calc)
Non-HDL Cholesterol (Calc): 66 mg/dL (calc) (ref <130)
Total CHOL/HDL Ratio: 2 (calc) (ref <5.0)
Triglycerides: 61 mg/dL (ref <150)

## 2019-10-13 LAB — COMPREHENSIVE METABOLIC PANEL
AG Ratio: 1.5 (calc) (ref 1.0–2.5)
ALT: 14 U/L (ref 6–29)
AST: 17 U/L (ref 10–35)
Albumin: 4 g/dL (ref 3.6–5.1)
Alkaline phosphatase (APISO): 43 U/L (ref 37–153)
BUN/Creatinine Ratio: 18 (calc) (ref 6–22)
BUN: 18 mg/dL (ref 7–25)
CO2: 28 mmol/L (ref 20–32)
Calcium: 9.7 mg/dL (ref 8.6–10.4)
Chloride: 106 mmol/L (ref 98–110)
Creat: 0.99 mg/dL — ABNORMAL HIGH (ref 0.60–0.93)
Globulin: 2.6 g/dL (calc) (ref 1.9–3.7)
Glucose, Bld: 85 mg/dL (ref 65–99)
Potassium: 4.8 mmol/L (ref 3.5–5.3)
Sodium: 143 mmol/L (ref 135–146)
Total Bilirubin: 0.6 mg/dL (ref 0.2–1.2)
Total Protein: 6.6 g/dL (ref 6.1–8.1)

## 2019-10-13 LAB — TSH: TSH: 2.13 mIU/L (ref 0.40–4.50)

## 2019-10-19 ENCOUNTER — Ambulatory Visit (INDEPENDENT_AMBULATORY_CARE_PROVIDER_SITE_OTHER): Payer: Medicare Other | Admitting: Family Medicine

## 2019-10-19 ENCOUNTER — Encounter: Payer: Self-pay | Admitting: Family Medicine

## 2019-10-19 ENCOUNTER — Other Ambulatory Visit: Payer: Self-pay

## 2019-10-19 VITALS — BP 152/88 | HR 60 | Temp 97.4°F | Resp 16 | Ht 67.0 in | Wt 142.0 lb

## 2019-10-19 DIAGNOSIS — F4321 Adjustment disorder with depressed mood: Secondary | ICD-10-CM

## 2019-10-19 DIAGNOSIS — I2510 Atherosclerotic heart disease of native coronary artery without angina pectoris: Secondary | ICD-10-CM | POA: Diagnosis not present

## 2019-10-19 DIAGNOSIS — R413 Other amnesia: Secondary | ICD-10-CM

## 2019-10-19 NOTE — Progress Notes (Signed)
Office Note 10/19/2019  CC:  Chief Complaint  Patient presents with  . Memory Issues    HPI:  Bonnie [name] is a 79 y.o. White female who is here to discuss concerns about memory impairment.  No one is with pt to give any hx today.  Pt gives all hx today, admits she doesn't see memory issues as a problem like her family has noted. She thinks the last 6 mo (when she went to [place] to live for a while) she has had memory impairment: repeating questions, telling stories repeatedly. No probs with name recall, people recognition.   Notes lots of anxiety b/c her son is worried about her memory.  Focuses well when reading and watching TV. No tangential conversation. She drives still, no probs. She had a concussion about 1 yr ago-CT head 09/07/18 NEG acute.  She had some balance/gait instability as after effect from this but this has resolved for the most part.  Occ mild feeling of "off balance" but no vertigo, HAs, focal weakness, paresthesias, or sleep problems.  Has had signif sadness/depression more than her usual for the last 6 mo or so.  Mild anhedonia.   No signif anxiety or anger issues, although the stress of covid pandemic has her more snippy with her husband. No hearing or vision deficit.  Occ bp check at home is <130/80.  FH: mother had CVA, some vascular dementia. Grandfather: some dementia (mild per pt's recollection)   Past Medical History:  Diagnosis Date  . Beta thalassemia, heterozygous    ?alpha? pt does not recall.  Marland Kitchen CAD (coronary artery disease) 07/2018   NSTEMI (small vessel occlusion, not amenable to intervention)-->2V CAD on cath, preserved LV systolic fxn.  . Diastolic dysfunction [id number]  . Fatigue   . GERD (gastroesophageal reflux disease)   . Hemorrhoids   . History of adenomatous polyp of colon   . Hyperlipidemia   . 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  Surgical path NEG for atypica or malignancy.  . NSTEMI (non-ST elevated myocardial infarction) ([place]) 07/2018   Dr. Percival Spanish  . Palpitations    08/2018 Event monitor normal.  . Rheumatoid arthritis ([place]) 2007   muscle weaknes at times (Dr. Amil Amen)    Past Surgical History:  Procedure Laterality Date  . ABDOMINAL HYSTERECTOMY  1987   DUB, no malignancy.  Ovaries still in.  Marland Kitchen BLADDER REPAIR    . Cardiac Event Monitor  08/22/2018   NO arrhythmias.  Her symptomatic periods coresponded to NSR.  Marland Kitchen CHOLECYSTECTOMY  1969  . COLONOSCOPY  11/07/2017   2014 adenoma.  Rpt 10/2017 adenoma x 1.  No further colonoscopies needed per GI.   Marland Kitchen LEFT HEART CATH AND CORONARY ANGIOGRAPHY N/A 08/08/2018   Procedure: LEFT HEART CATH AND CORONARY ANGIOGRAPHY;  Surgeon: Troy Sine, MD;  Location: [facility];  Service: Cardiovascular;  Laterality: N/A;  . THYROIDECTOMY  2012   cytologic atypia on nodule bx; surgical path NEG for atypica or malignancy.  . TRANSTHORACIC ECHOCARDIOGRAM  08/07/2018   EF 60-65%, no wall motion abnl, grd II DD, mod MR.  Marland Kitchen UPPER GASTROINTESTINAL ENDOSCOPY    . VAGINAL PROLAPSE REPAIR  2012    Family History  Problem Relation Age of Onset  . Hypertension Mother   . CVA Mother   . Colon cancer Neg Hx   .  Esophageal cancer Neg Hx   . Stomach cancer Neg Hx   . Rectal cancer Neg Hx     Social History   Socioeconomic History  . Marital status: Married    Spouse name: Not on file  . Number of children: 5  . Years of education: Not on file  . Highest education level: Not on file  Occupational History  . Not on file  Tobacco Use  . Smoking status: Former Smoker    Packs/day: 0.50    Years: 10.00    Pack years: 5.00    Types: Cigarettes    Quit date: 11/19/1973    Years since quitting: 45.9  . Smokeless tobacco: Never Used  Substance and Sexual Activity  . Alcohol use: Yes     Alcohol/week: 0.0 standard drinks    Comment: very rare glass of wine  . Drug use: No  . Sexual activity: Yes  Other Topics Concern  . Not on file  Social History Narrative   Lives with husband in [place].   Has 5 children.   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   Social Determinants of Health   Financial Resource Strain:   . Difficulty of Paying Living Expenses: Not on file  Food Insecurity:   . Worried About Charity fundraiser in the Last Year: Not on file  . Ran Out of Food in the Last Year: Not on file  Transportation Needs:   . Lack of Transportation (Medical): Not on file  . Lack of Transportation (Non-Medical): Not on file  Physical Activity:   . Days of Exercise per Week: Not on file  . Minutes of Exercise per Session: Not on file  Stress:   . Feeling of Stress : Not on file  Social Connections:   . Frequency of Communication with Friends and Family: Not on file  . Frequency of Social Gatherings with Friends and Family: Not on file  . Attends Religious Services: Not on file  . Active Member of Clubs or Organizations: Not on file  . Attends Archivist Meetings: Not on file  . Marital Status: Not on file  Intimate Partner Violence:   . Fear of Current or Ex-Partner: Not on file  . Emotionally Abused: Not on file  . Physically Abused: Not on file  . Sexually Abused: Not on file    Outpatient Medications Prior to Visit  Medication Sig Dispense Refill  . acetaminophen (TYLENOL) 500 MG tablet Take 500 mg by mouth every 6 (six) hours as needed for headache (pain).    Marland Kitchen aspirin EC 81 MG tablet Take 81 mg by mouth at bedtime.    . Calcium Citrate-Vitamin D (CALCIUM CITRATE + D PO) Take 1 tablet by mouth 2 (two) times daily.     . celecoxib (CELEBREX) 200 MG capsule Take by mouth daily.    . chlorhexidine (PERIDEX) 0.12 % solution     . Cholecalciferol (VITAMIN D3) 25 MCG (1000 UT) CAPS Take 1 capsule by  mouth daily.    . Coenzyme Q10 (COQ10 PO) Take by mouth.    Marland Kitchen FISH OIL-KRILL OIL PO Take by mouth.    . folic acid (FOLVITE) 1 MG tablet Take 1 mg by mouth 2 (two) times daily.     Marland Kitchen HYDROcodone-acetaminophen (NORCO/VICODIN) 5-325 MG tablet Take 1-2 tablets by mouth every 4 (four) hours as needed for moderate pain or severe pain. [place]  tablet 0  . isosorbide mononitrate (IMDUR) 60 MG 24 hr tablet Take 1 tablet (60 mg total) by mouth daily. 90 tablet 3  . levothyroxine (SYNTHROID) 88 MCG tablet Take 1 tablet (88 mcg total) by mouth daily before breakfast.    . LORazepam (ATIVAN) 0.5 MG tablet 1-2 tabs po bid prn anxiety 60 tablet 1  . methotrexate 2.5 MG tablet Take 12.5 mg by mouth every Sunday.     . Multiple Vitamin (MULTIVITAMIN WITH MINERALS) TABS tablet Take 1 tablet by mouth daily.    . nitroGLYCERIN (NITROSTAT) 0.4 MG SL tablet Place 1 tablet (0.4 mg total) under the tongue every 5 (five) minutes x 3 doses as needed for chest pain. 25 tablet 3  . potassium chloride (KLOR-CON) 8 MEQ tablet TAKE 2 TABLETS (16 MEQ TOTAL) BY MOUTH DAILY. 180 tablet 2  . rosuvastatin (CRESTOR) 40 MG tablet Take 1 tablet (40 mg total) by mouth daily at 6 PM. 90 tablet 2   No facility-administered medications prior to visit.    Allergies  Allergen Reactions  . Neosporin [Neomycin-Bacitracin Zn-Polymyx] Other (See Comments)    Patient states that wounds or scratches never heal. (pt uses bacitracin)   . Seasonal Ic [Cholestatin] Other (See Comments)    Leg cramps  . Triamcinolone Other (See Comments)    Burning sensation  . Atorvastatin Other (See Comments)    Leg cramps  . Monascus Purpureus Went Yeast Other (See Comments)    "Red Yeast Rice" causes leg cramps  . Pravastatin Other (See Comments)    Leg cramps    ROS Review of Systems  Constitutional: Negative for appetite change, chills, fatigue and fever.  HENT: Negative for congestion, dental problem, ear pain and sore throat.   Eyes: Negative for  discharge, redness and visual disturbance.  Respiratory: Negative for cough, chest tightness, shortness of breath and wheezing.   Cardiovascular: Negative for chest pain, palpitations and leg swelling.  Gastrointestinal: Negative for abdominal pain, blood in stool, diarrhea, nausea and vomiting.  Genitourinary: Negative for difficulty urinating, dysuria, flank pain, frequency, hematuria and urgency.  Musculoskeletal: Negative for arthralgias, back pain, joint swelling, myalgias and neck stiffness.  Skin: Negative for pallor and rash.  Neurological: Negative for dizziness, speech difficulty, weakness and headaches.  Hematological: Negative for adenopathy. Does not bruise/bleed easily.  Psychiatric/Behavioral: Negative for confusion and sleep disturbance. The patient is not nervous/anxious.     PE; Blood pressure (!) 152/88, pulse 60, temperature (!) 97.4 F (36.3 C), temperature source Temporal, resp. rate 16, height 5\' 7"  (1.702 m), weight 142 lb (64.4 kg), SpO2 97 %. Body mass index is 22.24 kg/m.  Gen: Alert, well appearing.  Patient is oriented to person, place, time, and situation. AFFECT: pleasant, lucid thought and speech. CV: RRR, no m/r/g.   LUNGS: CTA bilat, nonlabored resps, good aeration in all lung fields. EXT: no clubbing or cyanosis.  no edema.  Neuro: CN 2-12 intact bilaterally, strength 5/5 in proximal and distal upper extremities and lower extremities bilaterally.  No sensory deficits.  No tremor.  No disdiadochokinesis.  No ataxia.  Upper extremity and lower extremity DTRs symmetric.  No pronator drift.   Pertinent labs:  Lab Results  Component Value Date   TSH 2.13 10/12/2019   Lab Results  Component Value Date   WBC 7.5 08/14/2018   HGB 10.9 (L) 09/08/2018   HCT 32.0 (L) 09/08/2018   MCV 95.5 08/14/2018   PLT 272 08/14/2018   Lab Results  Component Value Date  IRON 67 06/14/2012   Lab Results  Component Value Date   VITAMINB12 793 06/28/2007   Lab  Results  Component Value Date   CREATININE 0.99 (H) 10/12/2019   BUN 18 10/12/2019   NA 143 10/12/2019   K 4.8 10/12/2019   CL 106 10/12/2019   CO2 28 10/12/2019   Lab Results  Component Value Date   ALT 14 10/12/2019   AST 17 10/12/2019   ALKPHOS 56 12/04/2012   BILITOT 0.6 10/12/2019   Lab Results  Component Value Date   CHOL 133 10/12/2019   Lab Results  Component Value Date   HDL 67 10/12/2019   Lab Results  Component Value Date   LDLCALC 52 10/12/2019   Lab Results  Component Value Date   TRIG 61 10/12/2019   Lab Results  Component Value Date   CHOLHDL 2.0 10/12/2019   Lab Results  Component Value Date   HGBA1C 5.7 (H) 08/07/2018   ASSESSMENT AND PLAN:    Memory impairment/mild cognitive impairment; occurring within 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 I don't think she meets criteria for MDD. Although the biggest part of this seems to have emerged over the last 6 mo, it seems to likely have been occurring before this-->she has been around her children a lot more the last 6 mo during pandemic so she is getting observed more, thus bringing out there concern of late. Plan: neuropsychiatric testing-->referral ordered today. No imaging or lab work at this time.    An After Visit Summary was printed and given to the patient.  FOLLOW UP:  No follow-ups on file.  Signed:  Crissie Sickles, MD           10/19/2019

## 2019-10-25 ENCOUNTER — Ambulatory Visit: Payer: Medicare Other | Attending: Internal Medicine

## 2019-10-25 DIAGNOSIS — Z23 Encounter for immunization: Secondary | ICD-10-CM

## 2019-10-25 NOTE — Progress Notes (Signed)
   Covid-19 Vaccination Clinic  Name:  Bonnie [name]    MRN: [id number] DOB: 04-10-1941  10/25/2019  [name] was observed post Covid-19 immunization for 15 minutes without incidence. She was provided with Vaccine Information Sheet and instruction to access the V-Safe system.   [name] was instructed to call 911 with any severe reactions post vaccine: Marland Kitchen Difficulty breathing  . Swelling of your face and throat  . A fast heartbeat  . A bad rash all over your body  . Dizziness and weakness    Immunizations Administered    Name Date Dose VIS Date Route   Pfizer COVID-19 Vaccine 10/25/2019  3:27 PM 0.3 mL 08/24/2019 Intramuscular   Manufacturer: [place]   Lot: [id number]   [place]: [id number]

## 2019-11-15 ENCOUNTER — Other Ambulatory Visit: Payer: Self-pay

## 2019-11-16 ENCOUNTER — Encounter: Payer: Self-pay | Admitting: Family Medicine

## 2019-11-16 ENCOUNTER — Ambulatory Visit (INDEPENDENT_AMBULATORY_CARE_PROVIDER_SITE_OTHER): Payer: Medicare Other | Admitting: Family Medicine

## 2019-11-16 VITALS — BP 132/76 | HR 48 | Temp 98.0°F | Resp 16 | Ht 67.0 in | Wt 144.0 lb

## 2019-11-16 DIAGNOSIS — I2510 Atherosclerotic heart disease of native coronary artery without angina pectoris: Secondary | ICD-10-CM

## 2019-11-16 DIAGNOSIS — R413 Other amnesia: Secondary | ICD-10-CM

## 2019-11-16 DIAGNOSIS — G3184 Mild cognitive impairment, so stated: Secondary | ICD-10-CM

## 2019-11-16 NOTE — Progress Notes (Signed)
OFFICE VISIT  11/16/2019   CC:  Chief Complaint  Patient presents with  . Follow-up    memory impairment   HPI:    Patient is a 79 y.o. Caucasian female who presents accompanied by her husband Bonnie [name] for 4 wk f/u memory impairment. 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 I don't think she meets criteria for MDD. Although the biggest part of this seems to have emerged over the last 6 mo, it seems to likely have been occurring before this-->she has been around her children a lot more the last 6 mo during pandemic so she is getting observed more, thus bringing out there concern of late. Plan: neuropsychiatric testing-->referral ordered today. No imaging or lab work at this time."  Interim hx: Neuropsych called her to set something up but got machine.  Then pt called her back (Bonnie [name]) and left message but no one has called her back since.  Pt called pt 11/09/19. Pt still having some short term memory impairment.  Driving, not getting lost. TAkes meds w/out problem but admits she has to make detailed lists to remember things. She does the shopping and this seems to be fine.  Has no problems with cooking.  Has a cleaning service.  Her husband pays the bills.  Husband really doesn't have anything in particular to add today. She does required me to repeat instructions several times about getting in touch with [facility] about neurocog testing.   Past Medical History:  Diagnosis Date  . Beta thalassemia, heterozygous    ?alpha? pt does not recall.  Marland Kitchen CAD (coronary artery disease) 07/2018   NSTEMI (small vessel occlusion, not amenable to intervention)-->2V CAD on cath, preserved LV systolic fxn.  . Diastolic dysfunction [id number]  . Fatigue   . GERD (gastroesophageal reflux disease)   . Hemorrhoids   . History of adenomatous polyp of colon   . Hyperlipidemia   . 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  Surgical path NEG for atypica or malignancy.  . NSTEMI (non-ST elevated myocardial infarction) ([place]) 07/2018   Dr. Percival Spanish  . Palpitations    08/2018 Event monitor normal.  . Rheumatoid arthritis ([place]) 2007   muscle weaknes at times (Dr. Amil Amen)    Past Surgical History:  Procedure Laterality Date  . ABDOMINAL HYSTERECTOMY  1987   DUB, no malignancy.  Ovaries still in.  Marland Kitchen BLADDER REPAIR    . Cardiac Event Monitor  08/22/2018   NO arrhythmias.  Her symptomatic periods coresponded to NSR.  Marland Kitchen CHOLECYSTECTOMY  1969  . COLONOSCOPY  11/07/2017   2014 adenoma.  Rpt 10/2017 adenoma x 1.  No further colonoscopies needed per GI.   Marland Kitchen LEFT HEART CATH AND CORONARY ANGIOGRAPHY N/A 08/08/2018   Procedure: LEFT HEART CATH AND CORONARY ANGIOGRAPHY;  Surgeon: Troy Sine, MD;  Location: [facility];  Service: Cardiovascular;  Laterality: N/A;  . THYROIDECTOMY  2012   cytologic atypia on nodule bx; surgical path NEG for atypica or malignancy.  . TRANSTHORACIC ECHOCARDIOGRAM  08/07/2018   EF 60-65%, no wall motion abnl, grd II DD, mod MR.  Marland Kitchen UPPER GASTROINTESTINAL ENDOSCOPY    . VAGINAL PROLAPSE REPAIR  2012    Outpatient Medications Prior to Visit  Medication Sig Dispense Refill  . acetaminophen (TYLENOL) 500 MG  tablet Take 500 mg by mouth every 6 (six) hours as needed for headache (pain).    Marland Kitchen aspirin EC 81 MG tablet Take 81 mg by mouth at bedtime.    . Calcium Citrate-Vitamin D (CALCIUM CITRATE + D PO) Take 1 tablet by mouth 2 (two) times daily.     . celecoxib (CELEBREX) 200 MG capsule Take by mouth daily.    . chlorhexidine (PERIDEX) 0.12 % solution     . Cholecalciferol (VITAMIN D3) 25 MCG (1000 UT) CAPS Take 1 capsule by mouth daily.    . Coenzyme Q10 (COQ10 PO) Take by mouth.    Marland Kitchen FISH OIL-KRILL OIL PO  Take by mouth.    . folic acid (FOLVITE) 1 MG tablet Take 1 mg by mouth 2 (two) times daily.     Marland Kitchen HYDROcodone-acetaminophen (NORCO/VICODIN) 5-325 MG tablet Take 1-2 tablets by mouth every 4 (four) hours as needed for moderate pain or severe pain. 30 tablet 0  . isosorbide mononitrate (IMDUR) 60 MG 24 hr tablet Take 1 tablet (60 mg total) by mouth daily. 90 tablet 3  . levothyroxine (SYNTHROID) 88 MCG tablet Take 1 tablet (88 mcg total) by mouth daily before breakfast.    . methotrexate 2.5 MG tablet Take 12.5 mg by mouth every Sunday.     . Multiple Vitamin (MULTIVITAMIN WITH MINERALS) TABS tablet Take 1 tablet by mouth daily.    . potassium chloride (KLOR-CON) 8 MEQ tablet TAKE 2 TABLETS (16 MEQ TOTAL) BY MOUTH DAILY. 180 tablet 2  . rosuvastatin (CRESTOR) 40 MG tablet Take 1 tablet (40 mg total) by mouth daily at 6 PM. 90 tablet 2  . LORazepam (ATIVAN) 0.5 MG tablet 1-2 tabs po bid prn anxiety (Patient not taking: Reported on 11/16/2019) 60 tablet 1  . nitroGLYCERIN (NITROSTAT) 0.4 MG SL tablet Place 1 tablet (0.4 mg total) under the tongue every 5 (five) minutes x 3 doses as needed for chest pain. (Patient not taking: Reported on 11/16/2019) 25 tablet 3   No facility-administered medications prior to visit.    Allergies  Allergen Reactions  . Neosporin [Neomycin-Bacitracin Zn-Polymyx] Other (See Comments)    Patient states that wounds or scratches never heal. (pt uses bacitracin)   . Seasonal Ic [Cholestatin] Other (See Comments)    Leg cramps  . Triamcinolone Other (See Comments)    Burning sensation  . Atorvastatin Other (See Comments)    Leg cramps  . Monascus Purpureus Went Yeast Other (See Comments)    "Red Yeast Rice" causes leg cramps  . Pravastatin Other (See Comments)    Leg cramps    ROS As per HPI  PE: Blood pressure 132/76, pulse (!) 48, temperature 98 F (36.7 C), temperature source Temporal, resp. rate 16, height 5\' 7"  (1.702 m), weight 144 lb (65.3 kg), SpO2 98  %. Gen: Alert, well appearing.  Patient is oriented to person, place, time, and situation. AFFECT: pleasant, lucid thought and speech. MMSE today 25/30 (recall, day of the week, and concentration impaired). No further exam today.  LABS:   09/07/18 CT Brain and neck w/out contrast (after fall w/hit head) IMPRESSION: 1. No evidence of traumatic intracranial injury or fracture. 2. No evidence of fracture or subluxation along the cervical spine. 3. Soft tissue laceration overlying the right frontal calvarium. 4. Mild to moderate cortical volume loss noted. 5. Mild degenerative change at the lower cervical spine.    Chemistry      Component Value Date/Time   NA 143 10/12/2019 1357  NA 141 08/29/2018 0000   K 4.8 10/12/2019 1357   CL 106 10/12/2019 1357   CO2 28 10/12/2019 1357   BUN 18 10/12/2019 1357   BUN 18 08/29/2018 0000   CREATININE 0.99 (H) 10/12/2019 1357      Component Value Date/Time   CALCIUM 9.7 10/12/2019 1357   ALKPHOS 56 12/04/2012 1123   AST 17 10/12/2019 1357   ALT 14 10/12/2019 1357   BILITOT 0.6 10/12/2019 1357     Lab Results  Component Value Date   WBC 7.5 08/14/2018   HGB 10.9 (L) 09/08/2018   HCT 32.0 (L) 09/08/2018   MCV 95.5 08/14/2018   PLT 272 08/14/2018   Lab Results  Component Value Date   TSH 2.13 10/12/2019   Lab Results  Component Value Date   [id number] 06/28/2007   Lab Results  Component Value Date   CHOL 133 10/12/2019   HDL 67 10/12/2019   LDLCALC 52 10/12/2019   LDLDIRECT 155.5 12/04/2012   TRIG 61 10/12/2019   CHOLHDL 2.0 10/12/2019   IMPRESSION AND PLAN:  Memory impairment, mild cognitive impairment---insidious onset and slow progression. Awaiting neurocognitive testing to be done--if [facility] doesn't call her back on 11/19/19 I told her to call them again. Then if no call back in 2d after that I told her to call our office to report this and we would attempt contact ourselves. [phone]). Once all this data is  gathered, anticipate referral to neurologist. No meds for memory at this time.  Spent 30 min with pt today, with >50% of this time spent in counseling and care coordination regarding the above problems.  An After Visit Summary was printed and given to the patient.  FOLLOW UP: Return in about 2 months (around 01/16/2020) for f/u memory.  Signed:  Crissie Sickles, MD           11/16/2019

## 2019-11-21 DIAGNOSIS — G5761 Lesion of plantar nerve, right lower limb: Secondary | ICD-10-CM | POA: Diagnosis not present

## 2019-11-21 DIAGNOSIS — M7751 Other enthesopathy of right foot: Secondary | ICD-10-CM | POA: Diagnosis not present

## 2019-11-28 DIAGNOSIS — M7751 Other enthesopathy of right foot: Secondary | ICD-10-CM | POA: Diagnosis not present

## 2019-11-28 DIAGNOSIS — G5761 Lesion of plantar nerve, right lower limb: Secondary | ICD-10-CM | POA: Diagnosis not present

## 2019-12-06 DIAGNOSIS — H2513 Age-related nuclear cataract, bilateral: Secondary | ICD-10-CM | POA: Diagnosis not present

## 2019-12-06 DIAGNOSIS — H524 Presbyopia: Secondary | ICD-10-CM | POA: Diagnosis not present

## 2019-12-06 DIAGNOSIS — H52223 Regular astigmatism, bilateral: Secondary | ICD-10-CM | POA: Diagnosis not present

## 2019-12-06 DIAGNOSIS — H5203 Hypermetropia, bilateral: Secondary | ICD-10-CM | POA: Diagnosis not present

## 2019-12-17 DIAGNOSIS — M0589 Other rheumatoid arthritis with rheumatoid factor of multiple sites: Secondary | ICD-10-CM | POA: Diagnosis not present

## 2019-12-18 ENCOUNTER — Ambulatory Visit: Payer: Medicare Other | Admitting: Psychologist

## 2019-12-24 ENCOUNTER — Other Ambulatory Visit: Payer: Self-pay

## 2019-12-24 ENCOUNTER — Telehealth: Payer: Self-pay | Admitting: Cardiology

## 2019-12-24 MED ORDER — ISOSORBIDE MONONITRATE ER 60 MG PO TB24: 60.0000 mg | ORAL_TABLET | Freq: Every day | ORAL | 3 refills | Status: DC

## 2019-12-24 NOTE — Telephone Encounter (Signed)
New Message  Pt c/o medication issue:  1. Name of Medication: isosorbide mononitrate (IMDUR) 60 MG 24 hr tablet  2. How are you currently taking this medication (dosage and times per day)? As written  3. Are you having a reaction (difficulty breathing--STAT)? No  4. What is your medication issue? Pt needs a new prescription per pharmacy to fill medication.

## 2019-12-24 NOTE — Telephone Encounter (Signed)
Spoke with pt. Notified that her prescription for Imdur 60mg  had been refilled and sent to her preferred pharmacy. Pt asking if she needed an appt. Notified that per Dr.Hochrein's last office note it said to follow up in one year. Notified that she would be receiving something in the mail to remind her to schedule that upcoming appt.  Pt verbalized understanding and had no further questions.

## 2020-01-10 ENCOUNTER — Telehealth: Payer: Self-pay

## 2020-01-10 ENCOUNTER — Other Ambulatory Visit: Payer: Self-pay

## 2020-01-10 ENCOUNTER — Ambulatory Visit (INDEPENDENT_AMBULATORY_CARE_PROVIDER_SITE_OTHER): Payer: Medicare Other | Admitting: Family Medicine

## 2020-01-10 ENCOUNTER — Encounter: Payer: Self-pay | Admitting: Family Medicine

## 2020-01-10 VITALS — BP 116/72 | HR 52 | Temp 98.0°F | Resp 16 | Ht 67.0 in | Wt 141.4 lb

## 2020-01-10 DIAGNOSIS — M069 Rheumatoid arthritis, unspecified: Secondary | ICD-10-CM

## 2020-01-10 DIAGNOSIS — H9311 Tinnitus, right ear: Secondary | ICD-10-CM

## 2020-01-10 DIAGNOSIS — Z87828 Personal history of other (healed) physical injury and trauma: Secondary | ICD-10-CM | POA: Diagnosis not present

## 2020-01-10 DIAGNOSIS — F419 Anxiety disorder, unspecified: Secondary | ICD-10-CM

## 2020-01-10 DIAGNOSIS — H903 Sensorineural hearing loss, bilateral: Secondary | ICD-10-CM

## 2020-01-10 DIAGNOSIS — F32A Depression, unspecified: Secondary | ICD-10-CM

## 2020-01-10 DIAGNOSIS — I2510 Atherosclerotic heart disease of native coronary artery without angina pectoris: Secondary | ICD-10-CM | POA: Diagnosis not present

## 2020-01-10 DIAGNOSIS — F329 Major depressive disorder, single episode, unspecified: Secondary | ICD-10-CM

## 2020-01-10 NOTE — Telephone Encounter (Signed)
PCP asked that patient be called to let her know that no  behavioral health providers are doing in-person visits and unfortunately it does not look like they will be doing them any time soon. LM for pt to call back if any further questions.

## 2020-01-10 NOTE — Progress Notes (Signed)
OFFICE VISIT  01/10/2020   CC:  Chief Complaint  Patient presents with  . Ringing in ear    right  . Twisted left knee    x1 month now, bought a brace to help with pain   HPI:    Patient is a 79 y.o. Caucasian female who presents for "ringing in ear and twisted left knee".  Onset approx 3-4 mo R ear ringing.  She is not sure about L.  No hearing deficit.  NO ear pain. No vertigo.  GAit has been a little unsteady since concussion she sustained back in Dec 2019. Otherwise no dizziness or vertigo.  The ringing is constant.   She has hx of ringing in both ears (?) in the past but thinks it went away for a while.  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   MRI brain with and w/ou contrast [id number] (done by Dr. Erik Obey for "ringing in ears with loss of smell for >1 yr, head trauma 01/2011, hx of HTN, hist of RA". IMPRESSION: Mild atrophy and white matter changes.  No acute stroke or intracranial mass lesion.  Olfactory bulbs and tracts appear symmetric without surrounding mass lesion, evidence for previous trauma, or significant atrophy.  No evidence for posterior fossa mass or temporal bone inflammatory Process.   L knee "popping", not really in pain.  No recollection of twisting injury or contusion/injury. Couple months hx of this.  No swelling or redness.   Past Medical History:  Diagnosis Date  . Beta thalassemia, heterozygous    ?alpha? pt does not recall.  Marland Kitchen CAD (coronary artery disease) 07/2018   NSTEMI (small vessel occlusion, not amenable to intervention)-->2V CAD on cath, preserved LV systolic fxn.  . Diastolic dysfunction [id number]  . Fatigue   . GERD (gastroesophageal reflux disease)   . Hemorrhoids   . History of adenomatous polyp of colon   . Hyperlipidemia   . Hypothyroidism, postsurgical 2012   thyroidectomy due to bx of dominant nodule showing cytologic atypia that could be indicative of early thyroid cancer (follicular variant of  papillary thyroid carcinoma).  Surgical path NEG for atypica or malignancy.  . NSTEMI (non-ST elevated myocardial infarction) ([place]) 07/2018   Dr. Percival Spanish  . Palpitations    08/2018 Event monitor normal.  . Rheumatoid arthritis ([place]) 2007   muscle weaknes at times (Dr. Amil Amen)    Past Surgical History:  Procedure Laterality Date  . ABDOMINAL HYSTERECTOMY  1987   DUB, no malignancy.  Ovaries still in.  Marland Kitchen BLADDER REPAIR    . Cardiac Event Monitor  08/22/2018   NO arrhythmias.  Her symptomatic periods coresponded to NSR.  Marland Kitchen CHOLECYSTECTOMY  1969  . COLONOSCOPY  11/07/2017   2014 adenoma.  Rpt 10/2017 adenoma x 1.  No further colonoscopies needed per GI.   Marland Kitchen LEFT HEART CATH AND CORONARY ANGIOGRAPHY N/A 08/08/2018   Procedure: LEFT HEART CATH AND CORONARY ANGIOGRAPHY;  Surgeon: Troy Sine, MD;  Location: [facility];  Service: Cardiovascular;  Laterality: N/A;  . THYROIDECTOMY  2012   cytologic atypia on nodule bx; surgical path NEG for atypica or malignancy.  . TRANSTHORACIC ECHOCARDIOGRAM  08/07/2018   EF 60-65%, no wall motion abnl, grd II DD, mod MR.  Marland Kitchen UPPER GASTROINTESTINAL ENDOSCOPY    . VAGINAL PROLAPSE REPAIR  2012    Outpatient Medications Prior to Visit  Medication Sig Dispense Refill  . aspirin EC 81 MG tablet Take 81 mg by mouth at  bedtime.    . Calcium Citrate-Vitamin D (CALCIUM CITRATE + D PO) Take 1 tablet by mouth 2 (two) times daily.     . chlorhexidine (PERIDEX) 0.12 % solution     . Cholecalciferol (VITAMIN D3) 25 MCG (1000 UT) CAPS Take 1 capsule by mouth daily.    . Coenzyme Q10 (COQ10 PO) Take by mouth.    Marland Kitchen FISH OIL-KRILL OIL PO Take by mouth.    . folic acid (FOLVITE) 1 MG tablet Take 1 mg by mouth 2 (two) times daily.     . isosorbide mononitrate (IMDUR) 60 MG 24 hr tablet Take 1 tablet (60 mg total) by mouth daily. 90 tablet 3  . levothyroxine (SYNTHROID) 88 MCG tablet Take 1 tablet (88 mcg total) by mouth daily before breakfast.    .  methotrexate 2.5 MG tablet Take 12.5 mg by mouth every Sunday.     . Multiple Vitamin (MULTIVITAMIN WITH MINERALS) TABS tablet Take 1 tablet by mouth daily.    . potassium chloride (KLOR-CON) 8 MEQ tablet TAKE 2 TABLETS (16 MEQ TOTAL) BY MOUTH DAILY. 180 tablet 2  . rosuvastatin (CRESTOR) 40 MG tablet Take 1 tablet (40 mg total) by mouth daily at 6 PM. 90 tablet 2  . acetaminophen (TYLENOL) 500 MG tablet Take 500 mg by mouth every 6 (six) hours as needed for headache (pain).    . celecoxib (CELEBREX) 200 MG capsule Take by mouth daily.    Marland Kitchen HYDROcodone-acetaminophen (NORCO/VICODIN) 5-325 MG tablet Take 1-2 tablets by mouth every 4 (four) hours as needed for moderate pain or severe pain. (Patient not taking: Reported on 01/10/2020) 30 tablet 0  . LORazepam (ATIVAN) 0.5 MG tablet 1-2 tabs po bid prn anxiety (Patient not taking: Reported on 11/16/2019) 60 tablet 1  . nitroGLYCERIN (NITROSTAT) 0.4 MG SL tablet Place 1 tablet (0.4 mg total) under the tongue every 5 (five) minutes x 3 doses as needed for chest pain. (Patient not taking: Reported on 11/16/2019) 25 tablet 3   No facility-administered medications prior to visit.    Allergies  Allergen Reactions  . Neosporin [Neomycin-Bacitracin Zn-Polymyx] Other (See Comments)    Patient states that wounds or scratches never heal. (pt uses bacitracin)   . Seasonal Ic [Cholestatin] Other (See Comments)    Leg cramps  . Triamcinolone Other (See Comments)    Burning sensation  . Atorvastatin Other (See Comments)    Leg cramps  . Monascus Purpureus Went Yeast Other (See Comments)    "Red Yeast Rice" causes leg cramps  . Pravastatin Other (See Comments)    Leg cramps    ROS As per HPI  PE: Blood pressure 116/72, pulse (!) 52, temperature 98 F (36.7 C), temperature source Temporal, resp. rate 16, height 5\' 7"  (1.702 m), weight 141 lb 6.4 oz (64.1 kg), SpO2 95 %. Gen: Alert, well appearing.  Patient is oriented to person, place, time, and  situation. AFFECT: pleasant, lucid thought and speech. ENT: Ears: EACs clear, normal epithelium.  TMs with good light reflex and landmarks bilaterally.  Eyes: no injection, icteris, swelling, or exudate.  EOMI, PERRLA. Nose: no drainage or turbinate edema/swelling.  No injection or focal lesion.  Mouth: lips without lesion/swelling.  Oral mucosa pink and moist.  Dentition intact and without obvious caries or gingival swelling.  Oropharynx without erythema, exudate, or swelling.  CV: RRR, no m/r/g.   LUNGS: CTA bilat, nonlabored resps, good aeration in all lung fields. EXT: no clubbing or cyanosis.  no edema.  L  knee w/out erythema or swelling.  Mild sensitivity to palpation of patella and with grinding and lat/med movement of patella.  No signif patellar ligament laxity or patellar hypermobility. ROM of knee fully intact.  Occ popping of patella can be heard but not consistently. McMurrays, lachmans, posterior drawer, and valgus/varus stress w/out any abnormality.  Weber: lateralizes to L ear Rinne: Air>bone bilat  LABS:  Lab Results  Component Value Date   TSH 2.13 10/12/2019     Chemistry      Component Value Date/Time   NA 143 10/12/2019 1357   NA 141 08/29/2018 0000   K 4.8 10/12/2019 1357   CL 106 10/12/2019 1357   CO2 28 10/12/2019 1357   BUN 18 10/12/2019 1357   BUN 18 08/29/2018 0000   CREATININE 0.99 (H) 10/12/2019 1357      Component Value Date/Time   CALCIUM 9.7 10/12/2019 1357   ALKPHOS 56 12/04/2012 1123   AST 17 10/12/2019 1357   ALT 14 10/12/2019 1357   BILITOT 0.6 10/12/2019 1357     Lab Results  Component Value Date   CHOL 133 10/12/2019   HDL 67 10/12/2019   LDLCALC 52 10/12/2019   LDLDIRECT 155.5 12/04/2012   TRIG 61 10/12/2019   CHOLHDL 2.0 10/12/2019    Hearing Screening   125Hz  250Hz  500Hz  1000Hz  2000Hz  3000Hz  4000Hz  6000Hz  8000Hz   Right ear:   40 40 40  40    Left ear:   40 35 30  60       IMPRESSION AND PLAN:  1) 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 Reassured pt of relatively benign nature of the condition. Knee sleeve for stability is all that is recommended at this time. She is having no pain fortunately.  2) Tinnitus, seems unilateral but pt cannot be absolutely sure. She has no subjective hearing loss but on pure tone audiometry testing today she has some mild loss bilat: L ear mild at all frequencies tested.  R ear same but worse at highest frequency. Refer back to Dr. Erik Obey to see if any further w/u necessary. (Hx of head trauma, has RA, chronic loss of smell and taste).  3) Anxiety and depression: at end of today's visit pt requested [facility] referral for counseling. I referred her last visit for this (and neurocog testing b/c of memory loss issues) but she decided not to go b/c she was not comfortable with doing a virtual visit, no option of in-person encounter. Will check with Dr. Gaynell Face to see if in person visit with her is an option.  An After Visit Summary was printed and given to the patient.  FOLLOW UP: Return in about 6 months (around 07/11/2020) for routine chronic illness f/u.  Signed:  Crissie Sickles, MD           01/10/2020

## 2020-01-11 NOTE — Addendum Note (Signed)
Addended by: Tammi Sou on: 01/11/2020 10:02 AM   Modules accepted: Orders

## 2020-01-11 NOTE — Telephone Encounter (Signed)
Patient advised regarding [facility] referral. She would still like to proceed with referral to [facility].

## 2020-01-11 NOTE — Telephone Encounter (Signed)
Patient advised referrals have been done for them both.

## 2020-01-11 NOTE — Telephone Encounter (Signed)
OK. Pls tell her that I also ordered this for her husband Vinnie as per his request yesterday when he was in here with her.-thx

## 2020-01-28 ENCOUNTER — Ambulatory Visit (INDEPENDENT_AMBULATORY_CARE_PROVIDER_SITE_OTHER): Payer: Medicare Other | Admitting: Psychologist

## 2020-01-28 DIAGNOSIS — F320 Major depressive disorder, single episode, mild: Secondary | ICD-10-CM

## 2020-02-07 DIAGNOSIS — H9313 Tinnitus, bilateral: Secondary | ICD-10-CM | POA: Insufficient documentation

## 2020-02-07 DIAGNOSIS — H903 Sensorineural hearing loss, bilateral: Secondary | ICD-10-CM | POA: Insufficient documentation

## 2020-02-07 DIAGNOSIS — R2681 Unsteadiness on feet: Secondary | ICD-10-CM | POA: Insufficient documentation

## 2020-02-13 ENCOUNTER — Encounter: Payer: Self-pay | Admitting: Family Medicine

## 2020-02-14 ENCOUNTER — Ambulatory Visit (INDEPENDENT_AMBULATORY_CARE_PROVIDER_SITE_OTHER): Payer: Medicare Other | Admitting: Psychologist

## 2020-02-14 DIAGNOSIS — F320 Major depressive disorder, single episode, mild: Secondary | ICD-10-CM

## 2020-02-18 ENCOUNTER — Other Ambulatory Visit: Payer: Self-pay

## 2020-02-18 ENCOUNTER — Telehealth: Payer: Self-pay

## 2020-02-18 MED ORDER — LEVOTHYROXINE SODIUM 88 MCG PO TABS: 88.0000 ug | ORAL_TABLET | Freq: Every day | ORAL | 3 refills | Status: DC

## 2020-02-18 NOTE — Telephone Encounter (Signed)
Ok levothyroxine RF'd

## 2020-02-18 NOTE — Telephone Encounter (Signed)
Message sent to PCP regarding refill.  Client [facility] - Client Client Site [facility] - Day Physician Crissie Sickles - MD Contact Type Call Who Is Calling Patient / Member / Family / Caregiver Caller Name [place] Phone Number [phone] Patient Name Bonnie [name] Patient DOB [birth year] Call Type Message Only Information Provided Reason for Call Request for General Office Information Initial Comment Caller states the pharmacy states she can't get her refill until she has a doctor appointment. She needs Synthroid. She uses [facility] [address] in [place]. Additional Comment Caller stated it was ok to send a message. She has enough medication to last until the office reopens. She declined triage. Advised her to call back when the office opens. Hours provided. Disp. Time Disposition Final User 02/17/2020 2:05:33 PM General Information Provided Yes Phillips Hay Call Closed By: Phillips Hay Transaction Date/Time: 02/17/2020 2:00:09 PM (ET)

## 2020-02-18 NOTE — Telephone Encounter (Signed)
RF request for Levothyroxine LOV:01/10/20 Next ov: 07/09/20 Last written:10/12/19  I believe this medication was added on 10/12/19 but no Rx written. Medication pending

## 2020-02-28 DIAGNOSIS — M50322 Other cervical disc degeneration at C5-C6 level: Secondary | ICD-10-CM | POA: Diagnosis not present

## 2020-02-28 DIAGNOSIS — M9901 Segmental and somatic dysfunction of cervical region: Secondary | ICD-10-CM | POA: Diagnosis not present

## 2020-02-28 DIAGNOSIS — M5136 Other intervertebral disc degeneration, lumbar region: Secondary | ICD-10-CM | POA: Diagnosis not present

## 2020-02-28 DIAGNOSIS — M5137 Other intervertebral disc degeneration, lumbosacral region: Secondary | ICD-10-CM | POA: Diagnosis not present

## 2020-02-28 DIAGNOSIS — M50321 Other cervical disc degeneration at C4-C5 level: Secondary | ICD-10-CM | POA: Diagnosis not present

## 2020-02-28 DIAGNOSIS — M9903 Segmental and somatic dysfunction of lumbar region: Secondary | ICD-10-CM | POA: Diagnosis not present

## 2020-02-28 DIAGNOSIS — M9902 Segmental and somatic dysfunction of thoracic region: Secondary | ICD-10-CM | POA: Diagnosis not present

## 2020-02-29 ENCOUNTER — Other Ambulatory Visit: Payer: Self-pay

## 2020-02-29 ENCOUNTER — Ambulatory Visit (INDEPENDENT_AMBULATORY_CARE_PROVIDER_SITE_OTHER): Payer: Medicare Other | Admitting: Family Medicine

## 2020-02-29 ENCOUNTER — Encounter: Payer: Self-pay | Admitting: Family Medicine

## 2020-02-29 VITALS — BP 131/75 | HR 56 | Temp 97.9°F | Resp 18 | Ht 67.0 in | Wt 141.0 lb

## 2020-02-29 DIAGNOSIS — I2510 Atherosclerotic heart disease of native coronary artery without angina pectoris: Secondary | ICD-10-CM

## 2020-02-29 DIAGNOSIS — E039 Hypothyroidism, unspecified: Secondary | ICD-10-CM | POA: Diagnosis not present

## 2020-02-29 DIAGNOSIS — F411 Generalized anxiety disorder: Secondary | ICD-10-CM

## 2020-02-29 DIAGNOSIS — I10 Essential (primary) hypertension: Secondary | ICD-10-CM

## 2020-02-29 DIAGNOSIS — E2839 Other primary ovarian failure: Secondary | ICD-10-CM

## 2020-02-29 DIAGNOSIS — Z0000 Encounter for general adult medical examination without abnormal findings: Secondary | ICD-10-CM

## 2020-02-29 DIAGNOSIS — E7800 Pure hypercholesterolemia, unspecified: Secondary | ICD-10-CM

## 2020-02-29 DIAGNOSIS — Z1231 Encounter for screening mammogram for malignant neoplasm of breast: Secondary | ICD-10-CM

## 2020-02-29 NOTE — Patient Instructions (Signed)
Call our office to make lab appt (fasting) at your earliest convenience.  You will get called about scheduling your mammogram and bone density test.

## 2020-02-29 NOTE — Progress Notes (Signed)
OFFICE VISIT  03/17/2020   CC:  Chief Complaint  Patient presents with  . Hypothyroidism    Pt states she is following up for her thyroid and needs labs.   HPI:    Patient is a 79 y.o. Caucasian female who presents for f/u HTN, HLD, and anx/dep that has been causing some mild cognitive impairment and memory impairment. She has hypothyroidism that WAS managed by endocrinologist in town but now I manage it.  She also has hx of CAD, hx of NSTEMI (lesion not amenable to intervention), preserved LV fxn but some DD.  Followed by Dr. Percival Spanish.   Anx/dep: I referred her to therapist last visit about 6 wks ago. She saw him once virtually so far, has 2nd appt planned. PMP AWARE reviewed today: most recent rx for lorazepam 0.5mg  was filled 02/28/19, # #[phone], rx by me. No red flags.  HTN: Home bp's consistently normal, HR 50s. No further palpitations---assoc with anxious moments.  HLD: tolerating her statin.    ROS: no fevers, no CP, no SOB, no wheezing, no cough, no dizziness, no HAs, no rashes, no melena/hematochezia.  No polyuria or polydipsia.  No myalgias or arthralgias.  No focal weakness, paresthesias, or tremors.  No acute vision or hearing abnormalities. No n/v/d or abd pain.  No palpitations.    Past Medical History:  Diagnosis Date  . Beta thalassemia, heterozygous    ?alpha? pt does not recall.  Marland Kitchen CAD (coronary artery disease) 07/2018   NSTEMI (small vessel occlusion, not amenable to intervention)-->2V CAD on cath, preserved LV systolic fxn.  . Diastolic dysfunction 08/4579  . Fatigue   . GERD (gastroesophageal reflux disease)   . Hemorrhoids   . History of adenomatous polyp of colon   . Hyperlipidemia   . 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  Surgical path NEG for atypica or malignancy.  . NSTEMI (non-ST  elevated myocardial infarction) ([place]) 07/2018   Dr. Percival Spanish  . Palpitations    08/2018 Event monitor normal.  . Rheumatoid arthritis ([place]) 2007   muscle weaknes at times (Dr. Amil Amen)  . Tinnitus, bilateral 2021   with high frequ SSHL->reassured by Ent    Past Surgical History:  Procedure Laterality Date  . ABDOMINAL HYSTERECTOMY  1987   DUB, no malignancy.  Ovaries still in.  Marland Kitchen BLADDER REPAIR    . Cardiac Event Monitor  08/22/2018   NO arrhythmias.  Her symptomatic periods coresponded to NSR.  Marland Kitchen CHOLECYSTECTOMY  1969  . COLONOSCOPY  11/07/2017   2014 adenoma.  Rpt 10/2017 adenoma x 1.  No further colonoscopies needed per GI.   Marland Kitchen LEFT HEART CATH AND CORONARY ANGIOGRAPHY N/A 08/08/2018   Procedure: LEFT HEART CATH AND CORONARY ANGIOGRAPHY;  Surgeon: Troy Sine, MD;  Location: [facility];  Service: Cardiovascular;  Laterality: N/A;  . THYROIDECTOMY  2012   cytologic atypia on nodule bx; surgical path NEG for atypica or malignancy.  . TRANSTHORACIC ECHOCARDIOGRAM  08/07/2018   EF 60-65%, no wall motion abnl, grd II DD, mod MR.  Marland Kitchen UPPER GASTROINTESTINAL ENDOSCOPY    . VAGINAL PROLAPSE REPAIR  2012    Outpatient Medications Prior to Visit  Medication Sig Dispense Refill  . acetaminophen (TYLENOL) 500 MG tablet Take 500 mg by mouth every 6 (six) hours as needed for headache (pain).    Marland Kitchen aspirin EC 81  MG tablet Take 81 mg by mouth at bedtime.    . Calcium Citrate-Vitamin D (CALCIUM CITRATE + D PO) Take 1 tablet by mouth 2 (two) times daily.     . celecoxib (CELEBREX) 200 MG capsule Take by mouth daily.    . chlorhexidine (PERIDEX) 0.12 % solution     . Cholecalciferol (VITAMIN D3) 25 MCG (1000 UT) CAPS Take 1 capsule by mouth daily.    . Coenzyme Q10 (COQ10 PO) Take by mouth.    Marland Kitchen FISH OIL-KRILL OIL PO Take by mouth.    . folic acid (FOLVITE) 1 MG tablet Take 1 mg by mouth 2 (two) times daily.     Marland Kitchen HYDROcodone-acetaminophen (NORCO/VICODIN) 5-325 MG tablet Take 1-2  tablets by mouth every 4 (four) hours as needed for moderate pain or severe pain. 30 tablet 0  . isosorbide mononitrate (IMDUR) 60 MG 24 hr tablet Take 1 tablet (60 mg total) by mouth daily. 90 tablet 3  . levothyroxine (SYNTHROID) 88 MCG tablet Take 1 tablet (88 mcg total) by mouth daily before breakfast. 90 tablet 3  . LORazepam (ATIVAN) 0.5 MG tablet 1-2 tabs po bid prn anxiety 60 tablet 1  . methotrexate 2.5 MG tablet Take 12.5 mg by mouth every Sunday.     . Multiple Vitamin (MULTIVITAMIN WITH MINERALS) TABS tablet Take 1 tablet by mouth daily.    . nitroGLYCERIN (NITROSTAT) 0.4 MG SL tablet Place 1 tablet (0.4 mg total) under the tongue every 5 (five) minutes x 3 doses as needed for chest pain. 25 tablet 3  . potassium chloride (KLOR-CON) 8 MEQ tablet TAKE 2 TABLETS (16 MEQ TOTAL) BY MOUTH DAILY. 180 tablet 2  . rosuvastatin (CRESTOR) 40 MG tablet Take 1 tablet (40 mg total) by mouth daily at 6 PM. 90 tablet 2   No facility-administered medications prior to visit.    Allergies  Allergen Reactions  . Neosporin [Neomycin-Bacitracin Zn-Polymyx] Other (See Comments)    Patient states that wounds or scratches never heal. (pt uses bacitracin)   . Seasonal Ic [Cholestatin] Other (See Comments)    Leg cramps  . Triamcinolone Other (See Comments)    Burning sensation  . Atorvastatin Other (See Comments)    Leg cramps  . Monascus Purpureus Went Yeast Other (See Comments)    "Red Yeast Rice" causes leg cramps  . Pravastatin Other (See Comments)    Leg cramps    ROS As per HPI  PE: Blood pressure 131/75, pulse (!) 56, temperature 97.9 F (36.6 C), temperature source Temporal, resp. rate 18, height 5\' 7"  (1.702 m), weight 141 lb (64 kg), SpO2 98 %. Gen: Alert, well appearing.  Patient is oriented to person, place, time, and situation. AFFECT: pleasant, lucid thought and speech. No further exam today.  LABS:  Lab Results  Component Value Date   TSH 2.13 10/12/2019   Lab Results   Component Value Date   WBC 7.5 08/14/2018   HGB 10.9 (L) 09/08/2018   HCT 32.0 (L) 09/08/2018   MCV 95.5 08/14/2018   PLT 272 08/14/2018   Lab Results  Component Value Date   [id number] 785 06/28/2007    Lab Results  Component Value Date   IRON 67 06/14/2012    Lab Results  Component Value Date   CREATININE 0.99 (H) 10/12/2019   BUN 18 10/12/2019   NA 143 10/12/2019   K 4.8 10/12/2019   CL 106 10/12/2019   CO2 28 10/12/2019   Lab Results  Component Value Date  ALT 14 10/12/2019   AST 17 10/12/2019   ALKPHOS 56 12/04/2012   BILITOT 0.6 10/12/2019   Lab Results  Component Value Date   CHOL 133 10/12/2019   Lab Results  Component Value Date   HDL 67 10/12/2019   Lab Results  Component Value Date   LDLCALC 52 10/12/2019   Lab Results  Component Value Date   TRIG 61 10/12/2019   Lab Results  Component Value Date   CHOLHDL 2.0 10/12/2019   Lab Results  Component Value Date   HGBA1C 5.7 (H) 08/07/2018    IMPRESSION AND PLAN:  1) Anxiety: she's stable, but would benefit greatly from IN-PERSON counseling. She'll try at least one more visit virtually with counselor but I don't think she's going to go much further with it b/c of it being virtual-only. Continue lorazepam prn.    2) HTN: bp good off meds (except imdur).  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 Lytes/cr, CBC--future.  3)) HLD: tolerating statin.  FLP and hepatic panel ---future.  4) Preventative health care: screening mammogram and DEXA ordered today.  5) Hypothyroidism: last TSH 09/2019 normal. TSH ordered--future.  An After Visit Summary was printed and given to the patient.  FOLLOW UP: Return in about 6 months (around 08/30/2020) for routine chronic illness f/u.  Signed:  Crissie Sickles, MD           03/17/2020

## 2020-03-03 ENCOUNTER — Ambulatory Visit (INDEPENDENT_AMBULATORY_CARE_PROVIDER_SITE_OTHER): Payer: Medicare Other | Admitting: Psychologist

## 2020-03-03 DIAGNOSIS — F320 Major depressive disorder, single episode, mild: Secondary | ICD-10-CM | POA: Diagnosis not present

## 2020-03-06 ENCOUNTER — Other Ambulatory Visit: Payer: Self-pay | Admitting: Family Medicine

## 2020-03-19 ENCOUNTER — Ambulatory Visit (INDEPENDENT_AMBULATORY_CARE_PROVIDER_SITE_OTHER): Payer: Medicare Other | Admitting: Psychologist

## 2020-03-19 DIAGNOSIS — F320 Major depressive disorder, single episode, mild: Secondary | ICD-10-CM

## 2020-03-24 ENCOUNTER — Other Ambulatory Visit: Payer: Self-pay

## 2020-03-24 ENCOUNTER — Ambulatory Visit (INDEPENDENT_AMBULATORY_CARE_PROVIDER_SITE_OTHER): Payer: Medicare Other | Admitting: Family Medicine

## 2020-03-24 DIAGNOSIS — I10 Essential (primary) hypertension: Secondary | ICD-10-CM

## 2020-03-24 DIAGNOSIS — E039 Hypothyroidism, unspecified: Secondary | ICD-10-CM | POA: Diagnosis not present

## 2020-03-24 DIAGNOSIS — E7800 Pure hypercholesterolemia, unspecified: Secondary | ICD-10-CM

## 2020-03-24 LAB — COMPREHENSIVE METABOLIC PANEL
ALT: 14 U/L (ref 0–35)
AST: 21 U/L (ref 0–37)
Albumin: 3.9 g/dL (ref 3.5–5.2)
Alkaline Phosphatase: 46 U/L (ref 39–117)
BUN: 17 mg/dL (ref 6–23)
CO2: 26 mEq/L (ref 19–32)
Calcium: 9 mg/dL (ref 8.4–10.5)
Chloride: 108 mEq/L (ref 96–112)
Creatinine, Ser: 0.8 mg/dL (ref 0.40–1.20)
GFR: 69.41 mL/min (ref >60.00)
Glucose, Bld: 87 mg/dL (ref 70–99)
Potassium: 4.3 mEq/L (ref 3.5–5.1)
Sodium: 141 mEq/L (ref 135–145)
Total Bilirubin: 0.4 mg/dL (ref 0.2–1.2)
Total Protein: 6.4 g/dL (ref 6.0–8.3)

## 2020-03-24 LAB — CBC WITH DIFFERENTIAL/PLATELET
Basophils Absolute: 0.2 10*3/uL — ABNORMAL HIGH (ref 0.0–0.1)
Basophils Relative: 3.1 % — ABNORMAL HIGH (ref 0.0–3.0)
Eosinophils Absolute: 0.3 10*3/uL (ref 0.0–0.7)
Eosinophils Relative: 4.5 % (ref 0.0–5.0)
HCT: 35.5 % — ABNORMAL LOW (ref 36.0–46.0)
Hemoglobin: 11.9 g/dL — ABNORMAL LOW (ref 12.0–15.0)
Lymphocytes Relative: 24 % (ref 12.0–46.0)
Lymphs Abs: 1.4 10*3/uL (ref 0.7–4.0)
MCHC: 33.6 g/dL (ref 30.0–36.0)
MCV: 93.9 fl (ref 78.0–100.0)
Monocytes Absolute: 0.6 10*3/uL (ref 0.1–1.0)
Monocytes Relative: 9.4 % (ref 3.0–12.0)
Neutro Abs: 3.5 10*3/uL (ref 1.4–7.7)
Neutrophils Relative %: 59 % (ref 43.0–77.0)
Platelets: 238 10*3/uL (ref 150.0–400.0)
RBC: 3.78 Mil/uL — ABNORMAL LOW (ref 3.87–5.11)
RDW: 15.3 % (ref 11.5–15.5)
WBC: 5.9 10*3/uL (ref 4.0–10.5)

## 2020-03-24 LAB — LIPID PANEL
Cholesterol: 137 mg/dL (ref 0–200)
HDL: 68.2 mg/dL (ref >39.00)
LDL Cholesterol: 59 mg/dL (ref 0–99)
NonHDL: 68.64
Total CHOL/HDL Ratio: 2
Triglycerides: 48 mg/dL (ref 0.0–149.0)
VLDL: 9.6 mg/dL (ref 0.0–40.0)

## 2020-03-24 LAB — TSH: TSH: 2.51 u[IU]/mL (ref 0.35–4.50)

## 2020-04-16 ENCOUNTER — Ambulatory Visit (INDEPENDENT_AMBULATORY_CARE_PROVIDER_SITE_OTHER): Payer: Medicare Other | Admitting: Psychologist

## 2020-04-16 DIAGNOSIS — F320 Major depressive disorder, single episode, mild: Secondary | ICD-10-CM | POA: Diagnosis not present

## 2020-04-26 DIAGNOSIS — I351 Nonrheumatic aortic (valve) insufficiency: Secondary | ICD-10-CM | POA: Insufficient documentation

## 2020-04-26 DIAGNOSIS — Z7189 Other specified counseling: Secondary | ICD-10-CM | POA: Insufficient documentation

## 2020-04-26 NOTE — Progress Notes (Signed)
Cardiology Office Note   Date:  04/28/2020   ID:  Bonnie [name], DOB [birth year], MRN [id number]  PCP:  Tammi Sou, MD  Cardiologist:   Minus Breeding, MD  Referring:  Tammi Sou, MD  Chief Complaint  Patient presents with  . Palpitations     History of Present Illness: Bonnie [name] is a 79 y.o. female who presents for evaluation of chest pain.  She was in the hospital in 08/06/2018 describing heavy pressure midsternal with associated nausea. EKG in ED was abnormal with inverted T-wave in Lead III.    She had cardiac cath on 08/08/2018 revealing 2 vessel disease with 60-65 % in the mLAD, and mRCA with an abrupt cut off in the mid posterior lateral vessel which was a small caliber vessel and suspected to be the culprit vessel with normal L Cx. She did not require intervention but was treated with DAPT with ASA and Brilinta, with aggressive lipid lowering recommended with increased dose of Crestor.  She was noted to have moderate MR and mild AI on echo with normal LV function .    She returns for follow up.  Since I last saw her she is had probably more palpitations.  She had these a few years ago but did not have any significant arrhythmias on monitor.  She is now having the more.  She notices them sometimes during the day but more at night.  She feels skipping beats.  She might feel weak or out of breath with some.  She has not had any frank syncope or presyncope.  She does feel anxious and was given a prescription for Ativan which she has not been using.  She is not having any chest pressure, neck or arm discomfort.  She has had no weight gain or edema.   Past Medical History:  Diagnosis Date  . Beta thalassemia, heterozygous    ?alpha? pt does not recall.  Marland Kitchen CAD (coronary artery disease) 07/2018   NSTEMI (small vessel occlusion, not amenable to intervention)-->2V CAD on cath, preserved LV systolic fxn.  . Diastolic dysfunction 36/6440  . Fatigue   . GERD  (gastroesophageal reflux disease)   . Hemorrhoids   . History of adenomatous polyp of colon   . Hyperlipidemia   . 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  Surgical path NEG for atypica or malignancy.  . NSTEMI (non-ST elevated myocardial infarction) ([place]) 07/2018   Dr. Percival Spanish  . Palpitations    08/2018 Event monitor normal.  . Rheumatoid arthritis ([place]) 2007   muscle weaknes at times (Dr. Amil Amen)  . Tinnitus, bilateral 2021   with high frequ SSHL->reassured by Ent    Past Surgical History:  Procedure Laterality Date  . ABDOMINAL HYSTERECTOMY  1987   DUB, no malignancy.  Ovaries still in.  Marland Kitchen BLADDER REPAIR    . Cardiac Event Monitor  08/22/2018   NO arrhythmias.  Her symptomatic periods coresponded to NSR.  Marland Kitchen CHOLECYSTECTOMY  1969  . COLONOSCOPY  11/07/2017   2014 adenoma.  Rpt 10/2017 adenoma x 1.  No further colonoscopies needed per GI.   Marland Kitchen LEFT HEART CATH AND CORONARY ANGIOGRAPHY N/A 08/08/2018   Procedure: LEFT HEART CATH AND CORONARY ANGIOGRAPHY;  Surgeon: Troy Sine, MD;  Location: [facility] [facility];  Service: Cardiovascular;  Laterality: N/A;  . THYROIDECTOMY  2012  cytologic atypia on nodule bx; surgical path NEG for atypica or malignancy.  . TRANSTHORACIC ECHOCARDIOGRAM  08/07/2018   EF 60-65%, no wall motion abnl, grd II DD, mod MR.  Marland Kitchen UPPER GASTROINTESTINAL ENDOSCOPY    . VAGINAL PROLAPSE REPAIR  2012     Current Outpatient Medications  Medication Sig Dispense Refill  . acetaminophen (TYLENOL) 500 MG tablet Take 500 mg by mouth every 6 (six) hours as needed for headache (pain).    Marland Kitchen aspirin EC 81 MG tablet Take 81 mg by mouth at bedtime.    . Calcium Citrate-Vitamin D (CALCIUM CITRATE + D PO) Take 1 tablet by mouth 2 (two) times daily.     . celecoxib (CELEBREX) 200 MG capsule Take by mouth  daily.    . chlorhexidine (PERIDEX) 0.12 % solution     . Coenzyme Q10 (COQ10 PO) Take by mouth.    Marland Kitchen FISH OIL-KRILL OIL PO Take by mouth.    . folic acid (FOLVITE) 1 MG tablet Take 1 mg by mouth 2 (two) times daily.     Marland Kitchen HYDROcodone-acetaminophen (NORCO/VICODIN) 5-325 MG tablet Take 1-2 tablets by mouth every 4 (four) hours as needed for moderate pain or severe pain. 30 tablet 0  . isosorbide mononitrate (IMDUR) 60 MG 24 hr tablet Take 1 tablet (60 mg total) by mouth daily. 90 tablet 3  . levothyroxine (SYNTHROID) 88 MCG tablet Take 1 tablet (88 mcg total) by mouth daily before breakfast. 90 tablet 3  . LORazepam (ATIVAN) 0.5 MG tablet 1-2 tabs po bid prn anxiety 60 tablet 1  . methotrexate 2.5 MG tablet Take 12.5 mg by mouth every Sunday.     . Multiple Vitamin (MULTIVITAMIN WITH MINERALS) TABS tablet Take 1 tablet by mouth daily.    . nitroGLYCERIN (NITROSTAT) 0.4 MG SL tablet Place 1 tablet (0.4 mg total) under the tongue every 5 (five) minutes x 3 doses as needed for chest pain. 25 tablet 3  . potassium chloride (KLOR-CON) 8 MEQ tablet TAKE 2 TABLETS (16 MEQ TOTAL) BY MOUTH DAILY. 180 tablet 2  . rosuvastatin (CRESTOR) 40 MG tablet Take 1 tablet (40 mg total) by mouth daily at 6 PM. 90 tablet 2   No current facility-administered medications for this visit.    Allergies:   Neosporin [neomycin-bacitracin zn-polymyx], Seasonal ic [cholestatin], Triamcinolone, Atorvastatin, Monascus purpureus went yeast, and Pravastatin    ROS:  Please see the history of present illness.   Otherwise, review of systems are positive for none.   All other systems are reviewed and negative.    PHYSICAL EXAM: VS:  BP 120/70 (BP Location: Left Arm, Patient Position: Sitting, Cuff Size: Normal)   Pulse (!) 51   Ht 5\' 7"  (1.702 m)   Wt 138 lb 3.2 oz (62.7 kg)   BMI 21.65 kg/m  , BMI Body mass index is 21.65 kg/m. GENERAL:  Well appearing NECK:  No jugular venous distention, waveform within normal limits,  carotid upstroke brisk and symmetric, no bruits, no thyromegaly LUNGS:  Clear to auscultation bilaterally CHEST:  Unremarkable HEART:  PMI not displaced or sustained,S1 and S2 within normal limits, no S3, no S4, no clicks, no rubs, no murmurs ABD:  Flat, positive bowel sounds normal in frequency in pitch, no bruits, no rebound, no guarding, no midline pulsatile mass, no hepatomegaly, no splenomegaly EXT:  2 plus pulses throughout, no edema, no cyanosis no clubbing   EKG:  EKG is  ordered today. Sinus rhythm, rate 51, axis within normal limits,  intervals within normal limits, nonspecific diffuse T wave flattening.  Recent Labs: 03/24/2020: ALT 14; BUN 17; Creatinine, Ser 0.80; Hemoglobin 11.9; Platelets 238.0; Potassium 4.3; Sodium 141; TSH 2.51    Lipid Panel    Component Value Date/Time   CHOL 137 03/24/2020 0925   TRIG 48.0 03/24/2020 0925   HDL 68.20 03/24/2020 0925   CHOLHDL 2 03/24/2020 0925   VLDL 9.6 03/24/2020 0925   LDLCALC 59 03/24/2020 0925   LDLCALC 52 10/12/2019 1357   LDLDIRECT 155.5 12/04/2012 1123     Lab Results  Component Value Date   TSH 2.51 03/24/2020     Wt Readings from Last 3 Encounters:  04/28/20 138 lb 3.2 oz (62.7 kg)  02/29/20 141 lb (64 kg)  01/10/20 141 lb 6.4 oz (64.1 kg)      Other studies Reviewed: Additional studies/ records that were reviewed today include: None Review of the above records demonstrates:    See elsewhere   ASSESSMENT AND PLAN:  CAD:    The patient has no new sypmtoms.  No further cardiovascular testing is indicated.  We will continue with aggressive risk reduction and meds as listed.  She was on dual antiplatelet therapy which was stopped sometime ago.  No change in medications.  MR:  This was moderate on echo in 2019.  I will repeat an echocardiogram.  I do not appreciate her regurgitation on exam.   AI:  This was mild.  This will be followed as above.   PALPITATIONS: I will order a 3-day Zio patch.  Of note TSH  was normal this year.  Electrolytes are unremarkable.  She has no significant arrhythmias.  No change in therapy.   DYSLIPDEMIA: Her LDL in July was 31 with an HDL of 68.  No change in therapy.  COVID EDUCATION: She has been vaccinated.  Current medicines are reviewed at length with the patient today.  The patient does not have concerns regarding medicines.  The following changes have been made:  None  Labs/ tests ordered today include:     Orders Placed This Encounter  Procedures  . LONG TERM MONITOR (3-14 DAYS)  . EKG 12-Lead  . ECHOCARDIOGRAM COMPLETE     Disposition:   FU with me in one year.    Signed, Minus Breeding, MD  04/28/2020 10:26 AM    [facility]

## 2020-04-28 ENCOUNTER — Encounter: Payer: Self-pay | Admitting: Cardiology

## 2020-04-28 ENCOUNTER — Ambulatory Visit (INDEPENDENT_AMBULATORY_CARE_PROVIDER_SITE_OTHER): Payer: Medicare Other | Admitting: Cardiology

## 2020-04-28 ENCOUNTER — Other Ambulatory Visit: Payer: Self-pay

## 2020-04-28 ENCOUNTER — Encounter: Payer: Self-pay | Admitting: *Deleted

## 2020-04-28 VITALS — BP 120/70 | HR 51 | Ht 67.0 in | Wt 138.2 lb

## 2020-04-28 DIAGNOSIS — R42 Dizziness and giddiness: Secondary | ICD-10-CM | POA: Diagnosis not present

## 2020-04-28 DIAGNOSIS — I2510 Atherosclerotic heart disease of native coronary artery without angina pectoris: Secondary | ICD-10-CM

## 2020-04-28 DIAGNOSIS — E785 Hyperlipidemia, unspecified: Secondary | ICD-10-CM

## 2020-04-28 DIAGNOSIS — I351 Nonrheumatic aortic (valve) insufficiency: Secondary | ICD-10-CM | POA: Diagnosis not present

## 2020-04-28 DIAGNOSIS — I340 Nonrheumatic mitral (valve) insufficiency: Secondary | ICD-10-CM | POA: Diagnosis not present

## 2020-04-28 DIAGNOSIS — R002 Palpitations: Secondary | ICD-10-CM | POA: Diagnosis not present

## 2020-04-28 DIAGNOSIS — Z7189 Other specified counseling: Secondary | ICD-10-CM

## 2020-04-28 NOTE — Progress Notes (Signed)
Patient ID: Bonnie [name], female   DOB: 1940-10-09, 79 y.o.   MRN: [id number] Patient enrolled for [employer] to ship a 3 day ZIO XT long term holter monitor to her home.

## 2020-04-28 NOTE — Patient Instructions (Addendum)
Medication Instructions:  No changes *If you need a refill on your cardiac medications before your next appointment, please call your pharmacy*  Lab Work: None ordered this visit  Testing/Procedures: Your physician has requested that you have an echocardiogram. Echocardiography is a painless test that uses sound waves to create images of your heart. It provides your doctor with information about the size and shape of your heart and how well your heart's chambers and valves are working. This procedure takes approximately one hour. There are no restrictions for this procedure.  Cardiac Monitor for 3 days  Follow-Up: At [facility], you and your health needs are our priority.  As part of our continuing mission to provide you with exceptional heart care, we have created designated Provider Care Teams.  These Care Teams include your primary Cardiologist (physician) and Advanced Practice Providers (APPs -  Physician Assistants and Nurse Practitioners) who all work together to provide you with the care you need, when you need it.   Your next appointment:   12 month(s)  The format for your next appointment:   In Person  Provider:   Minus Breeding, MD  Other Instructions  ZIO XT- Long Term Monitor Instructions   Your physician has requested you wear your ZIO patch monitor__3__days.   This is a single patch monitor.  [employer] supplies one patch monitor per enrollment.  Additional stickers are not available.   Please do not apply patch if you will be having a Nuclear Stress Test, Echocardiogram, Cardiac CT, MRI, or Chest Xray during the time frame you would be wearing the monitor. The patch cannot be worn during these tests.  You cannot remove and re-apply the ZIO XT patch monitor.   Your ZIO patch monitor will be sent USPS Priority mail from [facility] directly to your home address. The monitor may also be mailed to a PO BOX if home delivery is not available.   It may take 3-5  days to receive your monitor after you have been enrolled.   Once you have received you monitor, please review enclosed instructions.  Your monitor has already been registered assigning a specific monitor serial # to you.   Applying the monitor   Shave hair from upper left chest.   Hold abrader disc by orange tab.  Rub abrader in 40 strokes over left upper chest as indicated in your monitor instructions.   Clean area with 4 enclosed alcohol pads .  Use all pads to assure are is cleaned thoroughly.  Let dry.   Apply patch as indicated in monitor instructions.  Patch will be place under collarbone on left side of chest with arrow pointing upward.   Rub patch adhesive wings for 2 minutes.Remove white label marked "1".  Remove white label marked "2".  Rub patch adhesive wings for 2 additional minutes.   While looking in a mirror, press and release button in center of patch.  A small green light will flash 3-4 times .  This will be your only indicator the monitor has been turned on.     Do not shower for the first 24 hours.  You may shower after the first 24 hours.   Press button if you feel a symptom. You will hear a small click.  Record Date, Time and Symptom in the Patient Log Book.   When you are ready to remove patch, follow instructions on last 2 pages of Patient Log Book.  Stick patch monitor onto last page of Patient Log Book.  Place Patient Log Book in [place] box.  Use locking tab on box and tape box closed securely.  The [employer] has [employer] on it.  Please place in mailbox as soon as possible.  Your physician should have your test results approximately 7 days after the monitor has been mailed back to [facility].   Call [place] at [phone] if you have questions regarding your ZIO XT patch monitor.  Call them immediately if you see an orange light blinking on your monitor.   If your monitor falls off in less than 4 days contact our  Monitor department at [phone].  If your monitor becomes loose or falls off after 4 days call [employer] at [phone] for suggestions on securing your monitor.

## 2020-05-02 ENCOUNTER — Other Ambulatory Visit (INDEPENDENT_AMBULATORY_CARE_PROVIDER_SITE_OTHER): Payer: Medicare Other

## 2020-05-02 DIAGNOSIS — R002 Palpitations: Secondary | ICD-10-CM | POA: Diagnosis not present

## 2020-05-02 DIAGNOSIS — R42 Dizziness and giddiness: Secondary | ICD-10-CM

## 2020-05-12 ENCOUNTER — Ambulatory Visit (HOSPITAL_COMMUNITY): Payer: Medicare Other | Attending: Cardiology

## 2020-05-12 ENCOUNTER — Other Ambulatory Visit: Payer: Self-pay

## 2020-05-12 DIAGNOSIS — I340 Nonrheumatic mitral (valve) insufficiency: Secondary | ICD-10-CM | POA: Diagnosis not present

## 2020-05-12 LAB — ECHOCARDIOGRAM COMPLETE
Area-P 1/2: 4.29 cm2
P 1/2 time: 952 msec
S' Lateral: 3.1 cm

## 2020-05-13 DIAGNOSIS — Z1231 Encounter for screening mammogram for malignant neoplasm of breast: Secondary | ICD-10-CM | POA: Diagnosis not present

## 2020-05-13 DIAGNOSIS — Z01419 Encounter for gynecological examination (general) (routine) without abnormal findings: Secondary | ICD-10-CM | POA: Diagnosis not present

## 2020-05-13 DIAGNOSIS — Z6821 Body mass index (BMI) 21.0-21.9, adult: Secondary | ICD-10-CM | POA: Diagnosis not present

## 2020-05-13 LAB — HM MAMMOGRAPHY

## 2020-05-14 ENCOUNTER — Ambulatory Visit (INDEPENDENT_AMBULATORY_CARE_PROVIDER_SITE_OTHER): Payer: Medicare Other | Admitting: Psychologist

## 2020-05-14 DIAGNOSIS — F320 Major depressive disorder, single episode, mild: Secondary | ICD-10-CM

## 2020-05-16 DIAGNOSIS — Z20822 Contact with and (suspected) exposure to covid-19: Secondary | ICD-10-CM | POA: Diagnosis not present

## 2020-05-16 DIAGNOSIS — R42 Dizziness and giddiness: Secondary | ICD-10-CM | POA: Diagnosis not present

## 2020-05-16 DIAGNOSIS — R002 Palpitations: Secondary | ICD-10-CM | POA: Diagnosis not present

## 2020-05-22 ENCOUNTER — Encounter: Payer: Self-pay | Admitting: Family Medicine

## 2020-05-22 ENCOUNTER — Telehealth: Payer: Self-pay | Admitting: Cardiology

## 2020-05-22 NOTE — Telephone Encounter (Signed)
Patient is returning call.  °

## 2020-05-22 NOTE — Telephone Encounter (Signed)
The patient has been notified of the result and verbalized understanding.  All questions (if any) were answered. Antonieta Iba, RN 05/22/2020 12:47 PM  Patient states that she will let us know if palpitations start to be bothersome.

## 2020-05-22 NOTE — Telephone Encounter (Signed)
Left message for patient to call back  

## 2020-05-22 NOTE — Telephone Encounter (Signed)
Patient is calling for event monitor results.

## 2020-05-26 ENCOUNTER — Other Ambulatory Visit: Payer: Self-pay

## 2020-05-26 MED ORDER — ROSUVASTATIN CALCIUM 40 MG PO TABS: 40.0000 mg | ORAL_TABLET | Freq: Every day | ORAL | 3 refills | Status: DC

## 2020-06-03 ENCOUNTER — Other Ambulatory Visit: Payer: Self-pay

## 2020-06-03 MED ORDER — POTASSIUM CHLORIDE ER 8 MEQ PO TBCR: 16.0000 meq | EXTENDED_RELEASE_TABLET | Freq: Every day | ORAL | 3 refills | Status: DC

## 2020-06-10 ENCOUNTER — Encounter: Payer: Self-pay | Admitting: Family Medicine

## 2020-06-17 ENCOUNTER — Ambulatory Visit (INDEPENDENT_AMBULATORY_CARE_PROVIDER_SITE_OTHER): Payer: Medicare Other | Admitting: Psychologist

## 2020-06-17 DIAGNOSIS — F320 Major depressive disorder, single episode, mild: Secondary | ICD-10-CM | POA: Diagnosis not present

## 2020-06-17 DIAGNOSIS — Z23 Encounter for immunization: Secondary | ICD-10-CM | POA: Diagnosis not present

## 2020-06-24 ENCOUNTER — Ambulatory Visit (INDEPENDENT_AMBULATORY_CARE_PROVIDER_SITE_OTHER): Payer: Medicare Other | Admitting: Psychologist

## 2020-06-24 DIAGNOSIS — F320 Major depressive disorder, single episode, mild: Secondary | ICD-10-CM | POA: Diagnosis not present

## 2020-06-30 IMAGING — CR DG CHEST 2V
2 series · 2 of 2 positions shown · non-contrast
Comparison: Chest radiograph dated 08/07/2018

CLINICAL DATA: 77-year-old female with chest pain.

EXAM:
CHEST - 2 VIEW

[chest pa]
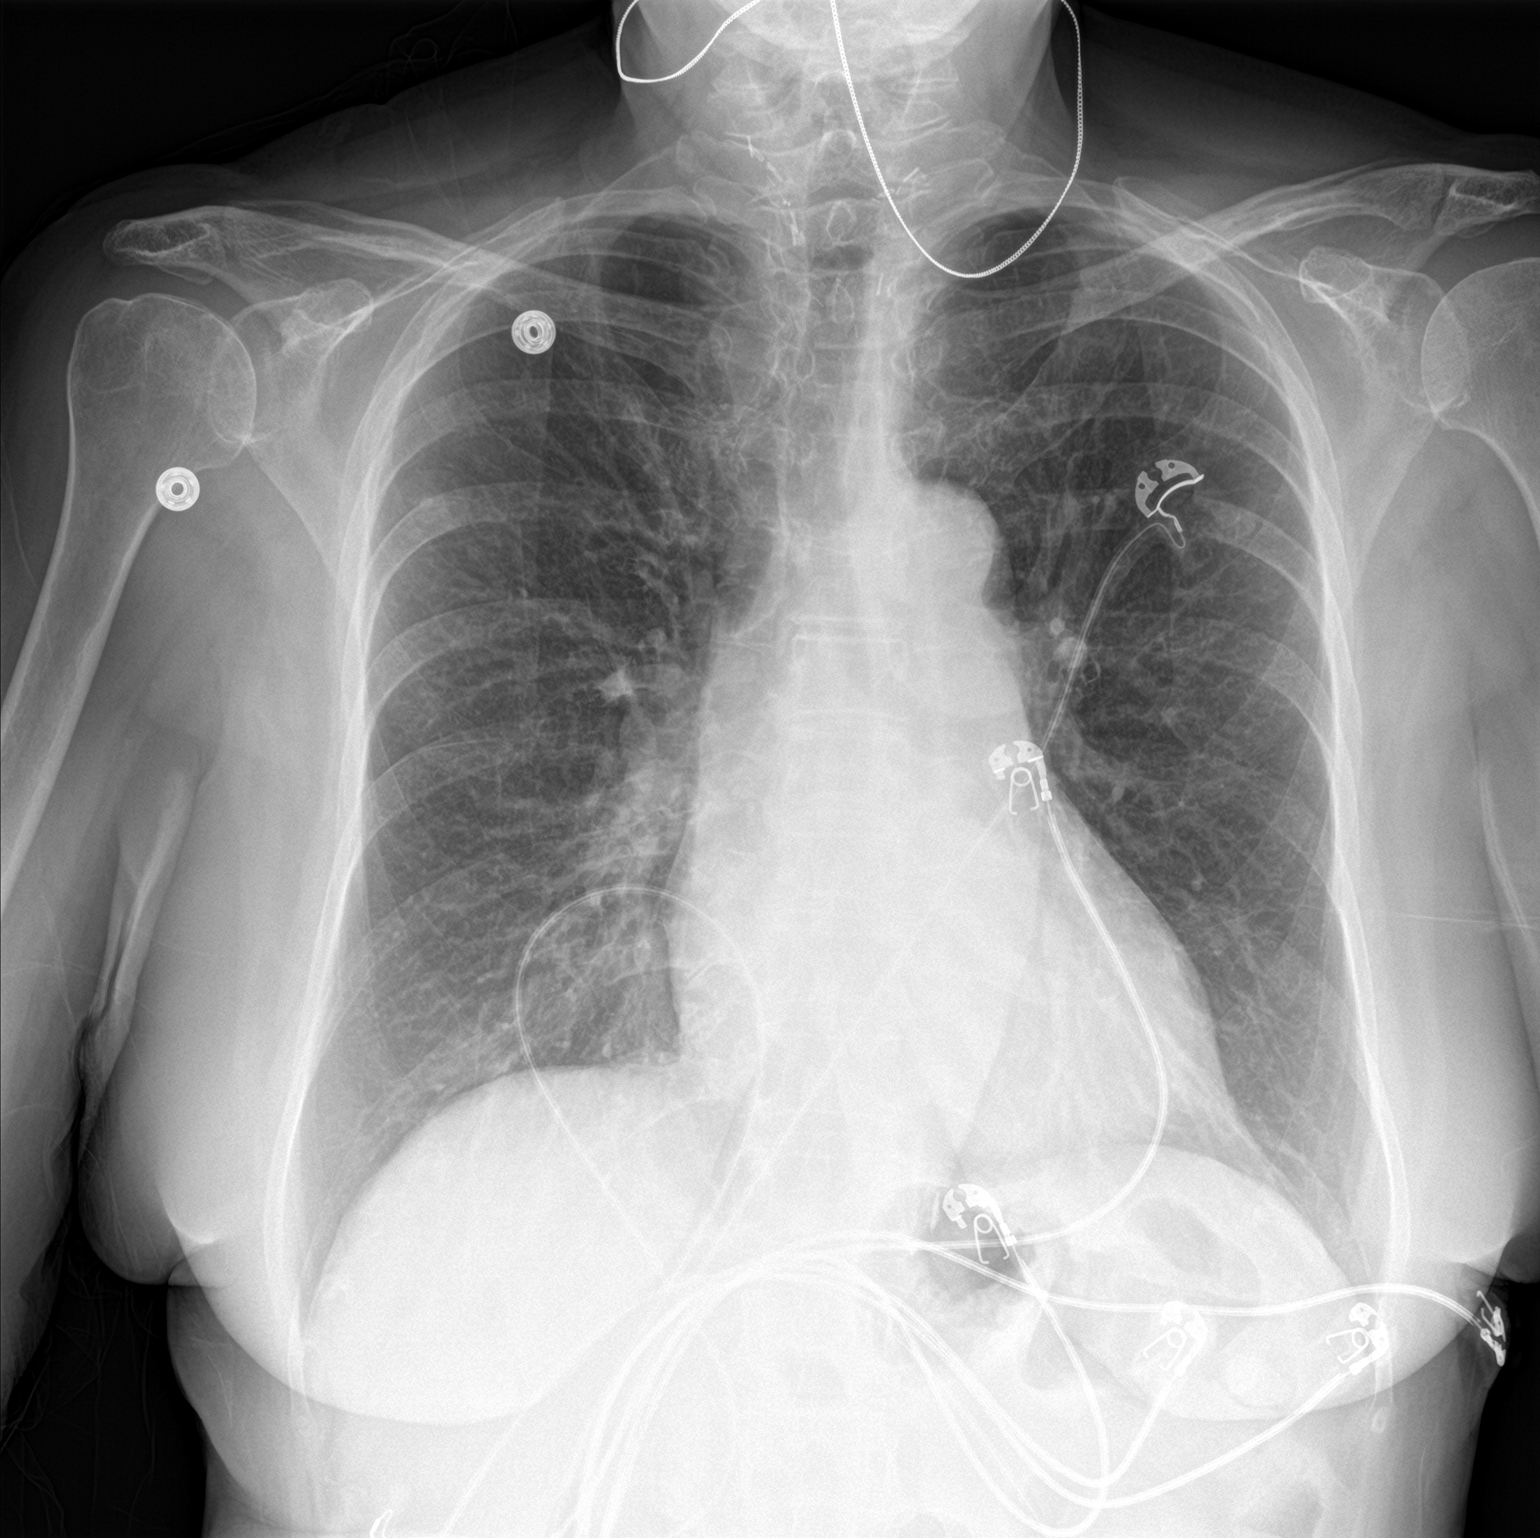

[chest lat]
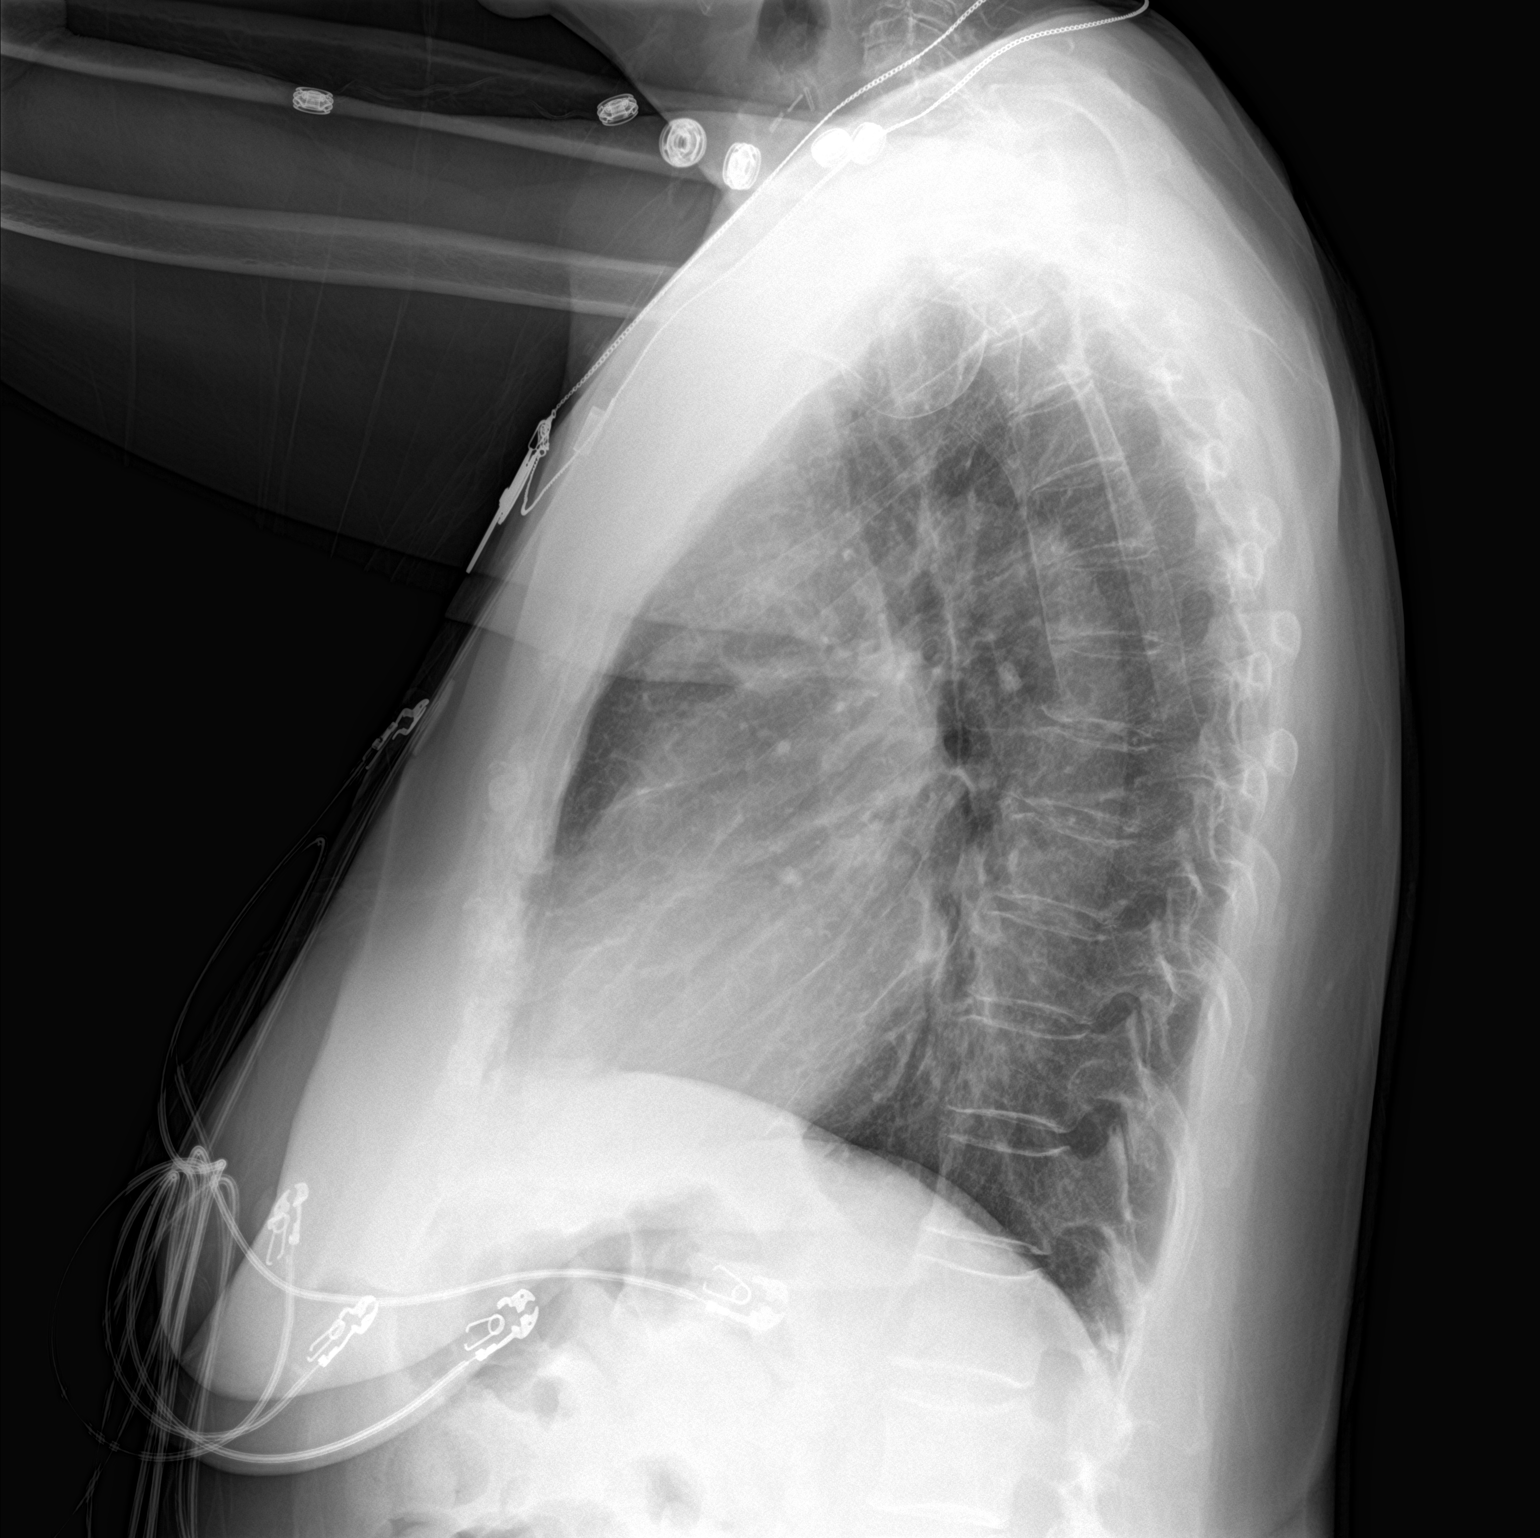

[2 of 2 positions shown; findings below may reference images not displayed]

FINDINGS: The lungs are clear. There is no pleural effusion pneumothorax.
Stable cardiac silhouette. Thyroidectomy surgical clips. No acute
osseous pathology.
IMPRESSION: No active cardiopulmonary disease.

## 2020-07-09 ENCOUNTER — Ambulatory Visit: Payer: Medicare Other | Admitting: Family Medicine

## 2020-07-25 ENCOUNTER — Ambulatory Visit (INDEPENDENT_AMBULATORY_CARE_PROVIDER_SITE_OTHER): Payer: Medicare Other | Admitting: Psychologist

## 2020-07-25 DIAGNOSIS — F320 Major depressive disorder, single episode, mild: Secondary | ICD-10-CM | POA: Diagnosis not present

## 2020-07-28 ENCOUNTER — Other Ambulatory Visit: Payer: Self-pay

## 2020-07-28 ENCOUNTER — Telehealth: Payer: Self-pay

## 2020-07-28 ENCOUNTER — Encounter: Payer: Self-pay | Admitting: Family Medicine

## 2020-07-28 ENCOUNTER — Ambulatory Visit (INDEPENDENT_AMBULATORY_CARE_PROVIDER_SITE_OTHER): Payer: Medicare Other | Admitting: Family Medicine

## 2020-07-28 VITALS — BP 115/69 | HR 52 | Temp 97.3°F | Resp 16 | Ht 67.0 in | Wt 142.2 lb

## 2020-07-28 DIAGNOSIS — I2510 Atherosclerotic heart disease of native coronary artery without angina pectoris: Secondary | ICD-10-CM | POA: Diagnosis not present

## 2020-07-28 DIAGNOSIS — E039 Hypothyroidism, unspecified: Secondary | ICD-10-CM

## 2020-07-28 DIAGNOSIS — F411 Generalized anxiety disorder: Secondary | ICD-10-CM | POA: Diagnosis not present

## 2020-07-28 DIAGNOSIS — E7800 Pure hypercholesterolemia, unspecified: Secondary | ICD-10-CM

## 2020-07-28 DIAGNOSIS — Z23 Encounter for immunization: Secondary | ICD-10-CM | POA: Diagnosis not present

## 2020-07-28 DIAGNOSIS — Z0000 Encounter for general adult medical examination without abnormal findings: Secondary | ICD-10-CM

## 2020-07-28 NOTE — Addendum Note (Signed)
Addended by: Deveron Furlong D on: 07/28/2020 02:32 PM   Modules accepted: Orders

## 2020-07-28 NOTE — Progress Notes (Signed)
OFFICE VISIT  07/28/2020  CC:  Chief Complaint  Patient presents with  . Follow-up    RCI, pt is not fasting    HPI:    Patient is a 79 y.o. Caucasian female who presents for 6 mo f/u hypothyroidism, hyperlipidemia, anxiety, hx of DD. She has CAD w/ hx of NSTEMI, no angioplasty or stent, reserved LV fxn.    A/P as of last visit: "1) Anxiety: she's stable, but would benefit greatly from IN-PERSON counseling. She'll try at least one more visit virtually with counselor but I don't think she's going to go much further with it b/c of it being virtual-only. Continue lorazepam prn.    2) HTN: bp good off meds (except imdur).  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 Lytes/cr, CBC--future.  3)) HLD: tolerating statin.  FLP and hepatic anel ---future.  4) Preventative health care: screening mammogram and DEXA ordered today.  5) Hypothyroidism: last TSH 09/2019 normal. TSH ordered--future."  INTERIM HX: All plabs 4 mo ago were normal (CBC, CMET, TSH, FLP). Here with husband Vinnie today. Doing well, very busy visiting all her children and grandchildren. She is active but no formal exercise since pandemic hit. Takes her lorazepam a couple of times per week avg, for some periods of inc stress/anxiety and/or sleep prob, but denies panic attacks.  Mood is not depressed. Tolerating daily rosuvastatin. Hypoth: Takes T4 on empty stomach w/out any other meds.  Her kids tell her to get her memory checked but she and husband can't give any examples today of the kind of memory issues her kids are concerned about. She drives w/out problems, does all ADLs w/out any assistance.  She occ has some word finding difficulty or can't recollect if she has taken her meds occasionally, but no other signif issues with short or long term memory impairment.  PMP AWARE reviewed today: most recent rx for lorazepam was filled 02/28/2019, # [phone], rx by me. No red flags.  ROS: no fevers, no CP, no SOB, no wheezing, no  cough, no dizziness, no HAs, no rashes, no melena/hematochezia.  No polyuria or polydipsia.  No myalgias or arthralgias.  No focal weakness, paresthesias, or tremors.  No acute vision or hearing abnormalities. No n/v/d or abd pain.  No palpitations.     Past Medical History:  Diagnosis Date  . Beta thalassemia, heterozygous    ?alpha? pt does not recall.  Marland Kitchen CAD (coronary artery disease) 07/2018   NSTEMI (small vessel occlusion, not amenable to intervention)-->2V CAD on cath, preserved LV systolic fxn.  . Diastolic dysfunction 23/5573  . Fatigue   . GERD (gastroesophageal reflux disease)   . Hemorrhoids   . History of adenomatous polyp of colon   . Hyperlipidemia   . 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  Surgical path NEG for atypica or malignancy.  . NSTEMI (non-ST elevated myocardial infarction) ([place]) 07/2018   Dr. Percival Spanish  . Palpitations    08/2018 Event monitor normal. Long term event monitor 05/2020 w/out signif arrhythmia--some brief SVT.  Marland Kitchen Rheumatoid arthritis ([place]) 2007   muscle weaknes at times (Dr. Amil Amen)  . Tinnitus, bilateral 2021   with high frequ SSHL->reassured by Ent    Past Surgical History:  Procedure Laterality Date  . ABDOMINAL HYSTERECTOMY  1987   DUB, no malignancy.  Ovaries still in.  Marland Kitchen BLADDER REPAIR    . Cardiac Event  Monitor  08/22/2018   NO arrhythmias.  Her symptomatic periods coresponded to NSR.  Marland Kitchen CHOLECYSTECTOMY  1969  . COLONOSCOPY  11/07/2017   2014 adenoma.  Rpt 10/2017 adenoma x 1.  No further colonoscopies needed per GI.   Marland Kitchen LEFT HEART CATH AND CORONARY ANGIOGRAPHY N/A 08/08/2018   Procedure: LEFT HEART CATH AND CORONARY ANGIOGRAPHY;  Surgeon: Troy Sine, MD;  Location: [place] [facility];  Service: Cardiovascular;  Laterality: N/A;  . THYROIDECTOMY  2012   cytologic  atypia on nodule bx; surgical path NEG for atypica or malignancy.  . TRANSTHORACIC ECHOCARDIOGRAM  08/07/2018   EF 60-65%, no wall motion abnl, grd II DD, mod MR.  Marland Kitchen UPPER GASTROINTESTINAL ENDOSCOPY    . VAGINAL PROLAPSE REPAIR  2012    Outpatient Medications Prior to Visit  Medication Sig Dispense Refill  . acetaminophen (TYLENOL) 500 MG tablet Take 500 mg by mouth every 6 (six) hours as needed for headache (pain).    Marland Kitchen aspirin EC 81 MG tablet Take 81 mg by mouth at bedtime.    . Calcium Citrate-Vitamin D (CALCIUM CITRATE + D PO) Take 1 tablet by mouth 2 (two) times daily.     . chlorhexidine (PERIDEX) 0.12 % solution     . Coenzyme Q10 (COQ10 PO) Take by mouth.    Marland Kitchen FISH OIL-KRILL OIL PO Take by mouth.    . folic acid (FOLVITE) 1 MG tablet Take 1 mg by mouth 2 (two) times daily.     . isosorbide mononitrate (IMDUR) 60 MG 24 hr tablet Take 1 tablet (60 mg total) by mouth daily. 90 tablet 3  . levothyroxine (SYNTHROID) 88 MCG tablet Take 1 tablet (88 mcg total) by mouth daily before breakfast. 90 tablet 3  . LORazepam (ATIVAN) 0.5 MG tablet 1-2 tabs po bid prn anxiety 60 tablet 1  . methotrexate 2.5 MG tablet Take 12.5 mg by mouth every Sunday.     . Multiple Vitamin (MULTIVITAMIN WITH MINERALS) TABS tablet Take 1 tablet by mouth daily.    . potassium chloride (KLOR-CON) 8 MEQ tablet Take 2 tablets (16 mEq total) by mouth daily. 180 tablet 3  . rosuvastatin (CRESTOR) 40 MG tablet Take 1 tablet (40 mg total) by mouth daily at 6 PM. 90 tablet 3  . celecoxib (CELEBREX) 200 MG capsule Take by mouth daily. (Patient not taking: Reported on 07/28/2020)    . HYDROcodone-acetaminophen (NORCO/VICODIN) 5-325 MG tablet Take 1-2 tablets by mouth every 4 (four) hours as needed for moderate pain or severe pain. (Patient not taking: Reported on 07/28/2020) 30 tablet 0  . nitroGLYCERIN (NITROSTAT) 0.4 MG SL tablet Place 1 tablet (0.4 mg total) under the tongue every 5 (five) minutes x 3 doses as needed for  chest pain. (Patient not taking: Reported on 07/28/2020) 25 tablet 3   No facility-administered medications prior to visit.    Allergies  Allergen Reactions  . Neosporin [Neomycin-Bacitracin Zn-Polymyx] Other (See Comments)    Patient states that wounds or scratches never heal. (pt uses bacitracin)   . Seasonal Ic [Cholestatin] Other (See Comments)    Leg cramps  . Triamcinolone Other (See Comments)    Burning sensation  . Atorvastatin Other (See Comments)    Leg cramps  . Monascus Purpureus Went Yeast Other (See Comments)    "Red Yeast Rice" causes leg cramps  . Pravastatin Other (See Comments)    Leg cramps    ROS As per HPI  PE: Vitals with BMI 07/28/2020 04/28/2020 02/29/2020  Height 5\' 7"  5\' 7"  5\' 7"   Weight 142 lbs 3 oz 138 lbs 3 oz 141 lbs  BMI 22.27 68.34 19.62  Systolic 229 798 921  Diastolic 69 70 75  Pulse 52 51 56    Gen: Alert, well appearing.  Patient is oriented to person, place, time, and situation. AFFECT: pleasant, lucid thought and speech. CV: RRR, no m/r/g.   LUNGS: CTA bilat, nonlabored resps, good aeration in all lung fields. EXT: no clubbing or cyanosis.  Trace bilat LL pitting edema.    LABS:  Lab Results  Component Value Date   TSH 2.51 03/24/2020   Lab Results  Component Value Date   WBC 5.9 03/24/2020   HGB 11.9 (L) 03/24/2020   HCT 35.5 (L) 03/24/2020   MCV 93.9 03/24/2020   PLT 238.0 03/24/2020   Lab Results  Component Value Date   IRON 67 06/14/2012   Lab Results  Component Value Date   VITAMINB12 793 06/28/2007    Lab Results  Component Value Date   CREATININE 0.80 03/24/2020   BUN 17 03/24/2020   NA 141 03/24/2020   K 4.3 03/24/2020   CL 108 03/24/2020   CO2 26 03/24/2020   Lab Results  Component Value Date   ALT 14 03/24/2020   AST 21 03/24/2020   ALKPHOS 46 03/24/2020   BILITOT 0.4 03/24/2020   Lab Results  Component Value Date   CHOL 137 03/24/2020   Lab Results  Component Value Date   HDL 68.20  03/24/2020   Lab Results  Component Value Date   LDLCALC 59 03/24/2020   Lab Results  Component Value Date   TRIG 48.0 03/24/2020   Lab Results  Component Value Date   CHOLHDL 2 03/24/2020   Lab Results  Component Value Date   HGBA1C 5.7 (H) 08/07/2018   IMPRESSION AND PLAN:  1) Hypothyroidism: doing well.  Cont 88 mcg T4 qd. Next TSH in 6 mo.   2) Hyperlipidemia: tolerating rosuva 40mg  qd. Lipids all excellent 4 mo ago. Plan FLP and hepatic panel in 6 mo.  3) GAD: she is using benzo appropriately. No new rx for this med was needed today.  4) CAD, hx of STEMI, DD. Asymptomatic.  Cont ASA, imdur, statin. No BB b/c sinus bradycardia. Has NTG but has not needed this.  5) Preventative healthcare: Flu vaccine today. All other vaccines UTD. I ordered DEXA and mammogram 02/2020 but she got both of these done since last o/v with me--> through her GYN MD and reports they were both normal.  An After Visit Summary was printed and given to the patient.  FOLLOW UP: Return in about 6 months (around 01/25/2021) for routine chronic illness f/u.  Signed:  Crissie Sickles, MD           07/28/2020

## 2020-07-28 NOTE — Telephone Encounter (Signed)
Noted  

## 2020-07-28 NOTE — Telephone Encounter (Signed)
FYI. Please see below.  Patient sees Dr.Schooler for psych. Has completed about 4 appointments virtually

## 2020-07-28 NOTE — Telephone Encounter (Signed)
Patient called regarding her flu shot she received today. Her husband received one also. See note in his chart. Please fax documentation she received flu shot.  Because of time of call 11/15 4:54PM , patient aware this phone encounter will not be handled until tomorrow 11/16  Please email to [email or web address]  Thank you.

## 2020-07-29 NOTE — Telephone Encounter (Signed)
Documentation sent to email and cc'd britt

## 2020-08-12 ENCOUNTER — Ambulatory Visit (INDEPENDENT_AMBULATORY_CARE_PROVIDER_SITE_OTHER): Payer: Medicare Other | Admitting: Psychologist

## 2020-08-12 ENCOUNTER — Telehealth: Payer: Self-pay | Admitting: Family Medicine

## 2020-08-12 DIAGNOSIS — F320 Major depressive disorder, single episode, mild: Secondary | ICD-10-CM | POA: Diagnosis not present

## 2020-08-12 NOTE — Telephone Encounter (Signed)
Patient would like referral for memory testing. She states she discussed this with Dr. Anitra Lauth at her last visit.

## 2020-08-12 NOTE — Telephone Encounter (Signed)
An appointment would be appropriate to further discuss this, nothing mentioned in recent office visit from 11/15. Please assist with scheduling, thanks. Please also inform patient PCP will be out of the office the rest of this week.

## 2020-08-12 NOTE — Telephone Encounter (Signed)
Patient scheduled for first available, 09/03/20.

## 2020-08-26 NOTE — Progress Notes (Signed)
Subjective:   Bonnie [name] is a 79 y.o. female who presents for an Initial Medicare Annual Wellness Visit.  Review of Systems     Cardiac Risk Factors include: advanced age (>51men, >66 women);dyslipidemia;sedentary lifestyle     Objective:    Today's Vitals   08/27/20 0802 08/27/20 0810  BP: 136/68   Pulse: (!) 51   Resp: 16   Temp: 98.4 F (36.9 C)   TempSrc: Oral   SpO2: 97%   Weight: 136 lb (61.7 kg)   Height: 5\' 7"  (1.702 m)   PainSc:  2    Body mass index is 21.3 kg/m.  Advanced Directives 08/27/2020 09/07/2018 08/14/2018 08/07/2018 06/03/2015  Does Patient Have a Medical Advance Directive? Yes No No No No  Type of Advance Directive Living will - - - -  Would patient like information on creating a medical advance directive? - - - No - Patient declined Yes - Educational materials given    Current Medications (verified) Outpatient Encounter Medications as of 08/27/2020  Medication Sig  . acetaminophen (TYLENOL) 500 MG tablet Take 500 mg by mouth every 6 (six) hours as needed for headache (pain).  Marland Kitchen aspirin EC 81 MG tablet Take 81 mg by mouth at bedtime.  . Calcium Citrate-Vitamin D (CALCIUM CITRATE + D PO) Take 1 tablet by mouth 2 (two) times daily.   . chlorhexidine (PERIDEX) 0.12 % solution   . Coenzyme Q10 (COQ10 PO) Take by mouth.  Marland Kitchen FISH OIL-KRILL OIL PO Take by mouth.  . folic acid (FOLVITE) 1 MG tablet Take 1 mg by mouth 2 (two) times daily.  . isosorbide mononitrate (IMDUR) 60 MG 24 hr tablet Take 1 tablet (60 mg total) by mouth daily.  Marland Kitchen levothyroxine (SYNTHROID) 88 MCG tablet Take 1 tablet (88 mcg total) by mouth daily before breakfast.  . LORazepam (ATIVAN) 0.5 MG tablet 1-2 tabs po bid prn anxiety  . methotrexate 2.5 MG tablet Take 12.5 mg by mouth every Sunday.   . Multiple Vitamin (MULTIVITAMIN WITH MINERALS) TABS tablet Take 1 tablet by mouth daily.  . potassium chloride (KLOR-CON) 8 MEQ tablet Take 2 tablets (16 mEq total) by mouth daily.  .  rosuvastatin (CRESTOR) 40 MG tablet Take 1 tablet (40 mg total) by mouth daily at 6 PM.  . celecoxib (CELEBREX) 200 MG capsule Take by mouth daily. (Patient not taking: No sig reported)  . HYDROcodone-acetaminophen (NORCO/VICODIN) 5-325 MG tablet Take 1-2 tablets by mouth every 4 (four) hours as needed for moderate pain or severe pain. (Patient not taking: No sig reported)  . nitroGLYCERIN (NITROSTAT) 0.4 MG SL tablet Place 1 tablet (0.4 mg total) under the tongue every 5 (five) minutes x 3 doses as needed for chest pain. (Patient not taking: No sig reported)   No facility-administered encounter medications on file as of 08/27/2020.    Allergies (verified) Neosporin [neomycin-bacitracin zn-polymyx], Seasonal ic [cholestatin], Triamcinolone, Atorvastatin, Monascus purpureus went yeast, and Pravastatin   History: Past Medical History:  Diagnosis Date  . Beta thalassemia, heterozygous    ?alpha? pt does not recall.  Marland Kitchen CAD (coronary artery disease) 07/2018   NSTEMI (small vessel occlusion, not amenable to intervention)-->2V CAD on cath, preserved LV systolic fxn.  . Diastolic dysfunction 64/3329  . Fatigue   . GERD (gastroesophageal reflux disease)   . Hemorrhoids   . History of adenomatous polyp of colon   . Hyperlipidemia   . 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  Surgical path NEG for atypica or malignancy.  . NSTEMI (non-ST elevated myocardial infarction) ([place]) 07/2018   Dr. Percival Spanish  . Palpitations    08/2018 Event monitor normal. Long term event monitor 05/2020 w/out signif arrhythmia--some brief SVT.  Marland Kitchen Rheumatoid arthritis ([place]) 2007   muscle weaknes at times (Dr. Amil Amen)  . Tinnitus, bilateral 2021   with high frequ SSHL->reassured by Ent   Past Surgical History:  Procedure Laterality Date  . ABDOMINAL  HYSTERECTOMY  1987   DUB, no malignancy.  Ovaries still in.  Marland Kitchen BLADDER REPAIR    . Cardiac Event Monitor  08/22/2018   NO arrhythmias.  Her symptomatic periods coresponded to NSR.  Marland Kitchen CHOLECYSTECTOMY  1969  . COLONOSCOPY  11/07/2017   2014 adenoma.  Rpt 10/2017 adenoma x 1.  No further colonoscopies needed per GI.   Marland Kitchen LEFT HEART CATH AND CORONARY ANGIOGRAPHY N/A 08/08/2018   Procedure: LEFT HEART CATH AND CORONARY ANGIOGRAPHY;  Surgeon: Troy Sine, MD;  Location: [facility];  Service: Cardiovascular;  Laterality: N/A;  . THYROIDECTOMY  2012   cytologic atypia on nodule bx; surgical path NEG for atypica or malignancy.  . TRANSTHORACIC ECHOCARDIOGRAM  08/07/2018   EF 60-65%, no wall motion abnl, grd II DD, mod MR.  Marland Kitchen UPPER GASTROINTESTINAL ENDOSCOPY    . VAGINAL PROLAPSE REPAIR  2012   Family History  Problem Relation Age of Onset  . Hypertension Mother   . CVA Mother   . Colon cancer Neg Hx   . Esophageal cancer Neg Hx   . Stomach cancer Neg Hx   . Rectal cancer Neg Hx    Social History   Socioeconomic History  . Marital status: Married    Spouse name: Not on file  . Number of children: 5  . Years of education: Not on file  . Highest education level: Not on file  Occupational History  . Not on file  Tobacco Use  . Smoking status: Former Smoker    Packs/day: 0.50    Years: 10.00    Pack years: 5.00    Types: Cigarettes    Quit date: 11/19/1973    Years since quitting: 46.8  . Smokeless tobacco: Never Used  Vaping Use  . Vaping Use: Never used  Substance and Sexual Activity  . Alcohol use: Yes    Alcohol/week: 0.0 standard drinks    Comment: very rare glass of wine  . Drug use: No  . Sexual activity: Yes  Other Topics Concern  . Not on file  Social History Narrative   Lives with husband in [place].   Has 5 children.   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   Social Determinants of Health    Financial Resource Strain: Low Risk   . Difficulty of Paying Living Expenses: Not hard at all  Food Insecurity: No Food Insecurity  . Worried About Charity fundraiser in the Last Year: Never true  . Ran Out of Food in the Last Year: Never true  Transportation Needs: No Transportation Needs  . Lack of Transportation (Medical): No  . Lack of Transportation (Non-Medical): No  Physical Activity: Not on file  Stress: Stress Concern Present  . Feeling of Stress : To some extent  Social Connections: Socially Integrated  . Frequency of Communication with Friends  and Family: More than three times a week  . Frequency of Social Gatherings with Friends and Family: Once a week  . Attends Religious Services: More than 4 times per year  . Active Member of Clubs or Organizations: Yes  . Attends Archivist Meetings: 1 to 4 times per year  . Marital Status: Married    Tobacco Counseling Counseling given: Not Answered   Clinical Intake:  Pre-visit preparation completed: Yes  Pain : 0-10 Pain Score: 2  Pain Type: Acute pain Pain Location: Finger (Comment which one) Pain Orientation: Left Pain Onset: In the past 7 days Pain Frequency: Constant     Nutritional Status: BMI of 19-24  Normal Nutritional Risks: Unintentional weight loss (5 pounds) Diabetes: No  How often do you need to have someone help you when you read instructions, pamphlets, or other written materials from your doctor or pharmacy?: 1 - Never What is the last grade level you completed in school?: 1 yr of college  Diabetic?No  Interpreter Needed?: No  Information entered by :: Caroleen Hamman LPN   Activities of Daily Living In your present state of health, do you have any difficulty performing the following activities: 08/27/2020  Hearing? N  Vision? N  Difficulty concentrating or making decisions? N  Comment but she is worried about losing her memory  Walking or climbing stairs? N  Dressing or  bathing? N  Doing errands, shopping? N  Preparing Food and eating ? N  Using the Toilet? N  In the past six months, have you accidently leaked urine? N  Do you have problems with loss of bowel control? N  Managing your Medications? N  Managing your Finances? N  Housekeeping or managing your Housekeeping? N  Some recent data might be hidden    Patient Care Team: Tammi Sou, MD as PCP - General (Family Medicine) Minus Breeding, MD as PCP - Cardiology (Cardiology) Altheimer, Legrand Como, MD as Consulting Physician (Endocrinology) Ladene Artist, MD as Consulting Physician (Gastroenterology) Minus Breeding, MD as Consulting Physician (Cardiology) Olena Leatherwood, MD as Consulting Physician (Rheumatology) Vania Rea, MD as Consulting Physician (Obstetrics and Gynecology) Armandina Gemma, MD as Consulting Physician (General Surgery) Jolene Provost, PA-C as Physician Assistant (Otolaryngology)  Indicate any recent Medical Services you may have received from other than [facility] providers in the past year (date may be approximate).     Assessment:   This is a routine wellness examination for [facility].  Hearing/Vision screen  Hearing Screening   125Hz  250Hz  500Hz  1000Hz  2000Hz  3000Hz  4000Hz  6000Hz  8000Hz   Right ear:           Left ear:           Comments: Denies hearing loss  Vision Screening Comments: Wears glasses Last eye exam-02/2020- Dr. Clydene Laming  Dietary issues and exercise activities discussed: Current Exercise Habits: The patient does not participate in regular exercise at present, Exercise limited by: None identified  Goals    . Patient Stated     Drink more water & do more walking      Depression Screen PHQ 2/9 Scores 08/27/2020 10/12/2019 08/04/2018 06/14/2012  PHQ - 2 Score 1 0 0 1    Fall Risk Fall Risk  08/27/2020 08/04/2018 05/07/2016  Falls in the past year? 0 0 No  Comment - - Emmi Telephone Survey: data to providers prior to load  Number falls in  past yr: 0 - -  Injury with Fall? 0 - -  Follow up Falls prevention  discussed - -    FALL RISK PREVENTION PERTAINING TO THE HOME:  Any stairs in or around the home? Yes  If so, are there any without handrails? No  Home free of loose throw rugs in walkways, pet beds, electrical cords, etc? Yes  Adequate lighting in your home to reduce risk of falls? Yes   ASSISTIVE DEVICES UTILIZED TO PREVENT FALLS:  Life alert? No  Use of a cane, walker or w/c? No  Grab bars in the bathroom? Yes  Shower chair or bench in shower? No  Elevated toilet seat or a handicapped toilet? No   TIMED UP AND GO:  Was the test performed? Yes .  Length of time to ambulate 10 feet: 10 sec.   Gait slow and steady without use of assistive device  Cognitive Function:     6CIT Screen 08/27/2020  What Year? 0 points  What month? 0 points  What time? 3 points  Count back from 20 0 points  Months in reverse 0 points  Repeat phrase 0 points  Total Score 3    Immunizations Immunization History  Administered Date(s) Administered  . Fluad Quad(high Dose 65+) 07/28/2020  . Hepatitis A 05/26/2006, 06/27/2006  . Hepatitis B 05/26/2006, 06/27/2006, 01/20/2007  . IPV 08/01/1941  . Influenza Split 06/14/2011, 06/14/2012  . Influenza Whole 07/10/2007, 07/17/2009, 08/20/2010  . Influenza, High Dose Seasonal PF 07/08/2014, 05/20/2016, 06/22/2017, 08/04/2018  . Influenza-Unspecified 06/13/2016  . PFIZER SARS-COV-2 Vaccination 10/05/2019, 10/25/2019, 06/17/2020  . Pneumococcal Polysaccharide-23 06/02/2007, 07/14/2012  . Td 09/13/2005  . Tdap 11/11/2018  . Typhoid Parenteral 05/26/2006  . Varicella 06/02/1955  . Varicella Zoster Immune Globulin 07/28/2012  . Zoster 07/29/2012, 11/11/2018  . Zoster Recombinat (Shingrix) 11/11/2018, 03/01/2019, 06/17/2020    TDAP status: Up to date  Flu Vaccine status: Up to date  Pneumococcal vaccine status: Patient unsure if she has had both Pneumonia vaccines. She will  discuss with PCP at nest office visit.  Covid-19 vaccine status: Completed vaccines  Qualifies for Shingles Vaccine? No   Zostavax completed Yes   Shingrix Completed?: Yes  Screening Tests Health Maintenance  Topic Date Due  . Hepatitis C Screening  Never done  . DEXA SCAN  Never done  . PNA vac Low Risk Adult (2 of 2 - PCV13) 07/14/2013  . MAMMOGRAM  05/13/2021  . TETANUS/TDAP  11/10/2028  . INFLUENZA VACCINE  Completed  . COVID-19 Vaccine  Completed    Health Maintenance  Health Maintenance Due  Topic Date Due  . Hepatitis C Screening  Never done  . DEXA SCAN  Never done  . PNA vac Low Risk Adult (2 of 2 - PCV13) 07/14/2013    Colorectal cancer screening: No longer required.   Mammogram status: Completed Bilateral 05/03/2020. Repeat every year  Bone Density status: Ordered today. Pt provided with contact info and advised to call to schedule appt.  Lung Cancer Screening: (Low Dose CT Chest recommended if Age 76-80 years, 30 pack-year currently smoking OR have quit w/in 15years.) does not qualify.     Additional Screening:  Hepatitis C Screening: does not qualify  Vision Screening: Recommended annual ophthalmology exams for early detection of glaucoma and other disorders of the eye. Is the patient up to date with their annual eye exam?  Yes  Who is the provider or what is the name of the office in which the patient attends annual eye exams? Dr. Clydene Laming   Dental Screening: Recommended annual dental exams for proper oral hygiene  Community  Resource Referral / Chronic Care Management: CRR required this visit?  No   CCM required this visit?  No      Plan:     I have personally reviewed and noted the following in the patient's chart:   . Medical and social history . Use of alcohol, tobacco or illicit drugs  . Current medications and supplements . Functional ability and status . Nutritional status . Physical activity . Advanced directives . List of other  physicians . Hospitalizations, surgeries, and ER visits in previous 12 months . Vitals . Screenings to include cognitive, depression, and falls . Referrals and appointments  In addition, I have reviewed and discussed with patient certain preventive protocols, quality metrics, and best practice recommendations. A written personalized care plan for preventive services as well as general preventive health recommendations were provided to patient.   Patient to access avs via mychart.  Marta Antu, LPN   [id number]  Nurse Health Advisor  Nurse Notes: None

## 2020-08-27 ENCOUNTER — Other Ambulatory Visit: Payer: Self-pay

## 2020-08-27 ENCOUNTER — Ambulatory Visit (INDEPENDENT_AMBULATORY_CARE_PROVIDER_SITE_OTHER): Payer: Medicare Other

## 2020-08-27 VITALS — BP 136/68 | HR 51 | Temp 98.4°F | Resp 16 | Ht 67.0 in | Wt 136.0 lb

## 2020-08-27 DIAGNOSIS — Z0000 Encounter for general adult medical examination without abnormal findings: Secondary | ICD-10-CM | POA: Diagnosis not present

## 2020-08-27 DIAGNOSIS — Z780 Asymptomatic menopausal state: Secondary | ICD-10-CM | POA: Diagnosis not present

## 2020-08-27 DIAGNOSIS — Z8639 Personal history of other endocrine, nutritional and metabolic disease: Secondary | ICD-10-CM | POA: Diagnosis not present

## 2020-08-27 NOTE — Patient Instructions (Signed)
Bonnie [name] , Thank you for taking time to come for your Medicare Wellness Visit. I appreciate your ongoing commitment to your health goals. Please review the following plan we discussed and let me know if I can assist you in the future.   Screening recommendations/referrals: Colonoscopy: No longer required Mammogram: Completed 05/03/2020-Due 05/03/2021 Bone Density: Ordered today. Someone will be calling you to schedule. Recommended yearly ophthalmology/optometry visit for glaucoma screening and checkup Recommended yearly dental visit for hygiene and checkup  Vaccinations: Influenza vaccine: Up to date Pneumococcal vaccine: May need Prevnar-13-Discuss with Dr. Anitra Lauth. Tdap vaccine: Up to date. Due-2/29/2030 Shingles vaccine: Completed vaccines  Covid-19:Completed vacines  Advanced directives: Copy in chart  Conditions/risks identified: See problem list  Next appointment: Follow up in one year for your annual wellness visit    Preventive Care 78 Years and Older, Female Preventive care refers to lifestyle choices and visits with your health care provider that can promote health and wellness. What does preventive care include?  A yearly physical exam. This is also called an annual well check.  Dental exams once or twice a year.  Routine eye exams. Ask your health care provider how often you should have your eyes checked.  Personal lifestyle choices, including:  Daily care of your teeth and gums.  Regular physical activity.  Eating a healthy diet.  Avoiding tobacco and drug use.  Limiting alcohol use.  Practicing safe sex.  Taking low-dose aspirin every day.  Taking vitamin and mineral supplements as recommended by your health care provider. What happens during an annual well check? The services and screenings done by your health care provider during your annual well check will depend on your age, overall health, lifestyle risk factors, and family history of  disease. Counseling  Your health care provider may ask you questions about your:  Alcohol use.  Tobacco use.  Drug use.  Emotional well-being.  Home and relationship well-being.  Sexual activity.  Eating habits.  History of falls.  Memory and ability to understand (cognition).  Work and work Statistician.  Reproductive health. Screening  You may have the following tests or measurements:  Height, weight, and BMI.  Blood pressure.  Lipid and cholesterol levels. These may be checked every 5 years, or more frequently if you are over 19 years old.  Skin check.  Lung cancer screening. You may have this screening every year starting at age 46 if you have a 30-pack-year history of smoking and currently smoke or have quit within the past 15 years.  Fecal occult blood test (FOBT) of the stool. You may have this test every year starting at age 12.  Flexible sigmoidoscopy or colonoscopy. You may have a sigmoidoscopy every 5 years or a colonoscopy every 10 years starting at age 60.  Hepatitis C blood test.  Hepatitis B blood test.  Sexually transmitted disease (STD) testing.  Diabetes screening. This is done by checking your blood sugar (glucose) after you have not eaten for a while (fasting). You may have this done every 1-3 years.  Bone density scan. This is done to screen for osteoporosis. You may have this done starting at age 43.  Mammogram. This may be done every 1-2 years. Talk to your health care provider about how often you should have regular mammograms. Talk with your health care provider about your test results, treatment options, and if necessary, the need for more tests. Vaccines  Your health care provider may recommend certain vaccines, such as:  Influenza vaccine. This is  recommended every year.  Tetanus, diphtheria, and acellular pertussis (Tdap, Td) vaccine. You may need a Td booster every 10 years.  Zoster vaccine. You may need this after age  78.  Pneumococcal 13-valent conjugate (PCV13) vaccine. One dose is recommended after age 2.  Pneumococcal polysaccharide (PPSV23) vaccine. One dose is recommended after age 69. Talk to your health care provider about which screenings and vaccines you need and how often you need them. This information is not intended to replace advice given to you by your health care provider. Make sure you discuss any questions you have with your health care provider. Document Released: 09/26/2015 Document Revised: 05/19/2016 Document Reviewed: 07/01/2015 Elsevier Interactive Patient Education  2017 [place] Prevention in the Home Falls can cause injuries. They can happen to people of all ages. There are many things you can do to make your home safe and to help prevent falls. What can I do on the outside of my home?  Regularly fix the edges of walkways and driveways and fix any cracks.  Remove anything that might make you trip as you walk through a door, such as a raised step or threshold.  Trim any bushes or trees on the path to your home.  Use bright outdoor lighting.  Clear any walking paths of anything that might make someone trip, such as rocks or tools.  Regularly check to see if handrails are loose or broken. Make sure that both sides of any steps have handrails.  Any raised decks and porches should have guardrails on the edges.  Have any leaves, snow, or ice cleared regularly.  Use sand or salt on walking paths during winter.  Clean up any spills in your garage right away. This includes oil or grease spills. What can I do in the bathroom?  Use night lights.  Install grab bars by the toilet and in the tub and shower. Do not use towel bars as grab bars.  Use non-skid mats or decals in the tub or shower.  If you need to sit down in the shower, use a plastic, non-slip stool.  Keep the floor dry. Clean up any water that spills on the floor as soon as it happens.  Remove  soap buildup in the tub or shower regularly.  Attach bath mats securely with double-sided non-slip rug tape.  Do not have throw rugs and other things on the floor that can make you trip. What can I do in the bedroom?  Use night lights.  Make sure that you have a light by your bed that is easy to reach.  Do not use any sheets or blankets that are too big for your bed. They should not hang down onto the floor.  Have a firm chair that has side arms. You can use this for support while you get dressed.  Do not have throw rugs and other things on the floor that can make you trip. What can I do in the kitchen?  Clean up any spills right away.  Avoid walking on wet floors.  Keep items that you use a lot in easy-to-reach places.  If you need to reach something above you, use a strong step stool that has a grab bar.  Keep electrical cords out of the way.  Do not use floor polish or wax that makes floors slippery. If you must use wax, use non-skid floor wax.  Do not have throw rugs and other things on the floor that can make you trip.  What can I do with my stairs?  Do not leave any items on the stairs.  Make sure that there are handrails on both sides of the stairs and use them. Fix handrails that are broken or loose. Make sure that handrails are as long as the stairways.  Check any carpeting to make sure that it is firmly attached to the stairs. Fix any carpet that is loose or worn.  Avoid having throw rugs at the top or bottom of the stairs. If you do have throw rugs, attach them to the floor with carpet tape.  Make sure that you have a light switch at the top of the stairs and the bottom of the stairs. If you do not have them, ask someone to add them for you. What else can I do to help prevent falls?  Wear shoes that:  Do not have high heels.  Have rubber bottoms.  Are comfortable and fit you well.  Are closed at the toe. Do not wear sandals.  If you use a  stepladder:  Make sure that it is fully opened. Do not climb a closed stepladder.  Make sure that both sides of the stepladder are locked into place.  Ask someone to hold it for you, if possible.  Clearly mark and make sure that you can see:  Any grab bars or handrails.  First and last steps.  Where the edge of each step is.  Use tools that help you move around (mobility aids) if they are needed. These include:  Canes.  Walkers.  Scooters.  Crutches.  Turn on the lights when you go into a dark area. Replace any light bulbs as soon as they burn out.  Set up your furniture so you have a clear path. Avoid moving your furniture around.  If any of your floors are uneven, fix them.  If there are any pets around you, be aware of where they are.  Review your medicines with your doctor. Some medicines can make you feel dizzy. This can increase your chance of falling. Ask your doctor what other things that you can do to help prevent falls. This information is not intended to replace advice given to you by your health care provider. Make sure you discuss any questions you have with your health care provider. Document Released: 06/26/2009 Document Revised: 02/05/2016 Document Reviewed: 10/04/2014 Elsevier Interactive Patient Education  2017 [employer].

## 2020-09-03 ENCOUNTER — Ambulatory Visit: Payer: Medicare Other | Admitting: Family Medicine

## 2020-09-13 DIAGNOSIS — D472 Monoclonal gammopathy: Secondary | ICD-10-CM

## 2020-09-13 HISTORY — DX: Monoclonal gammopathy: D47.2

## 2020-09-17 ENCOUNTER — Ambulatory Visit (INDEPENDENT_AMBULATORY_CARE_PROVIDER_SITE_OTHER): Payer: Medicare Other | Admitting: Psychologist

## 2020-09-17 DIAGNOSIS — F320 Major depressive disorder, single episode, mild: Secondary | ICD-10-CM | POA: Diagnosis not present

## 2020-09-22 ENCOUNTER — Ambulatory Visit: Payer: Medicare Other | Admitting: Family Medicine

## 2020-09-23 ENCOUNTER — Ambulatory Visit: Payer: Medicare Other | Admitting: Family Medicine

## 2020-09-25 ENCOUNTER — Ambulatory Visit (INDEPENDENT_AMBULATORY_CARE_PROVIDER_SITE_OTHER): Payer: Medicare Other | Admitting: Psychologist

## 2020-09-25 DIAGNOSIS — F320 Major depressive disorder, single episode, mild: Secondary | ICD-10-CM

## 2020-09-29 ENCOUNTER — Ambulatory Visit: Payer: Medicare Other | Admitting: Family Medicine

## 2020-09-30 DIAGNOSIS — M0589 Other rheumatoid arthritis with rheumatoid factor of multiple sites: Secondary | ICD-10-CM | POA: Diagnosis not present

## 2020-09-30 DIAGNOSIS — Z79899 Other long term (current) drug therapy: Secondary | ICD-10-CM | POA: Diagnosis not present

## 2020-10-01 ENCOUNTER — Ambulatory Visit: Payer: Medicare Other | Admitting: Family Medicine

## 2020-10-06 ENCOUNTER — Encounter: Payer: Self-pay | Admitting: Family Medicine

## 2020-10-06 ENCOUNTER — Ambulatory Visit (INDEPENDENT_AMBULATORY_CARE_PROVIDER_SITE_OTHER): Payer: Medicare Other | Admitting: Family Medicine

## 2020-10-06 ENCOUNTER — Other Ambulatory Visit: Payer: Self-pay

## 2020-10-06 VITALS — BP 125/67 | HR 55 | Temp 97.7°F | Resp 16 | Ht 67.0 in | Wt 136.0 lb

## 2020-10-06 DIAGNOSIS — R413 Other amnesia: Secondary | ICD-10-CM

## 2020-10-06 NOTE — Progress Notes (Signed)
OFFICE VISIT  10/06/2020  CC:  Chief Complaint  Patient presents with  . Follow-up    Memory, would referral for memory testing    HPI:    Patient is a 80 y.o. Caucasian female who presents accompanied by her husband Vinnie for f/u memory loss.  Hx of some mild short term memory problems, felt to likely be part of normal aging +/- anxiety-related.  Has seen psychologist several times->Matthew Schooler.  She says he "released" her, says he had no concerns. She has never seen a neurologist for this problem. She says one of her daughters remarks to her regularly that she needs to get "tested" for memory problems. Pt states she doesn't think she has signif memory impairment.  Husband agrees with her.  Occ has some word-search difficulty.  Admits she has to write lots of things down on calender but does have a lot to remember (she is active socially and with many children and grandchildren).  Does not have long term memory complaints.   No dizziness, HAs, tremors, balance problems, vision c/o, or hearing complaints.    Takes loraz RARELY prn anxiety. PMP AWARE reviewed today: most recent rx for lorazepam 0.5mg  was filled 02/28/19, # 89, rx by me. No red flags.   Past Medical History:  Diagnosis Date  . Beta thalassemia, heterozygous    ?alpha? pt does not recall.  Marland Kitchen CAD (coronary artery disease) 07/2018   NSTEMI (small vessel occlusion, not amenable to intervention)-->2V CAD on cath, preserved LV systolic fxn.  . Diastolic dysfunction 41/9379  . Fatigue   . GERD (gastroesophageal reflux disease)   . Hemorrhoids   . History of adenomatous polyp of colon   . Hyperlipidemia   . 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  Surgical path NEG for atypica or malignancy.  . NSTEMI (non-ST elevated myocardial infarction) ([place])  07/2018   Dr. Percival Spanish  . Palpitations    08/2018 Event monitor normal. Long term event monitor 05/2020 w/out signif arrhythmia--some brief SVT.  Marland Kitchen Rheumatoid arthritis ([place]) 2007   muscle weaknes at times (Dr. Amil Amen)  . Tinnitus, bilateral 2021   with high frequ SSHL->reassured by Ent    Past Surgical History:  Procedure Laterality Date  . ABDOMINAL HYSTERECTOMY  1987   DUB, no malignancy.  Ovaries still in.  Marland Kitchen BLADDER REPAIR    . Cardiac Event Monitor  08/22/2018   NO arrhythmias.  Her symptomatic periods coresponded to NSR.  Marland Kitchen CHOLECYSTECTOMY  1969  . COLONOSCOPY  11/07/2017   2014 adenoma.  Rpt 10/2017 adenoma x 1.  No further colonoscopies needed per GI.   Marland Kitchen LEFT HEART CATH AND CORONARY ANGIOGRAPHY N/A 08/08/2018   Procedure: LEFT HEART CATH AND CORONARY ANGIOGRAPHY;  Surgeon: Troy Sine, MD;  Location: [facility];  Service: Cardiovascular;  Laterality: N/A;  . THYROIDECTOMY  2012   cytologic atypia on nodule bx; surgical path NEG for atypica or malignancy.  . TRANSTHORACIC ECHOCARDIOGRAM  08/07/2018   EF 60-65%, no wall motion abnl, grd II DD, mod MR.  Marland Kitchen UPPER GASTROINTESTINAL ENDOSCOPY    . VAGINAL PROLAPSE REPAIR  2012    Outpatient Medications Prior to Visit  Medication Sig Dispense Refill  . aspirin EC 81 MG tablet Take 81 mg by mouth at bedtime.    . Calcium Citrate-Vitamin D (CALCIUM CITRATE + D PO) Take 1  tablet by mouth 2 (two) times daily.     . Coenzyme Q10 (COQ10 PO) Take by mouth.    . folic acid (FOLVITE) 1 MG tablet Take 1 mg by mouth 2 (two) times daily.    . isosorbide mononitrate (IMDUR) 60 MG 24 hr tablet Take 1 tablet (60 mg total) by mouth daily. 90 tablet 3  . levothyroxine (SYNTHROID) 88 MCG tablet Take 1 tablet (88 mcg total) by mouth daily before breakfast. 90 tablet 3  . LORazepam (ATIVAN) 0.5 MG tablet 1-2 tabs po bid prn anxiety 60 tablet 1  . methotrexate 2.5 MG tablet Take 12.5 mg by mouth every Sunday.     . Multiple Vitamin  (MULTIVITAMIN WITH MINERALS) TABS tablet Take 1 tablet by mouth daily.    . Omega-3 Fatty Acids (FISH OIL PO) Take by mouth daily.    . potassium chloride (KLOR-CON) 8 MEQ tablet Take 2 tablets (16 mEq total) by mouth daily. 180 tablet 3  . rosuvastatin (CRESTOR) 40 MG tablet Take 1 tablet (40 mg total) by mouth daily at 6 PM. 90 tablet 3  . acetaminophen (TYLENOL) 500 MG tablet Take 500 mg by mouth every 6 (six) hours as needed for headache (pain). (Patient not taking: Reported on 10/06/2020)    . celecoxib (CELEBREX) 200 MG capsule Take by mouth daily. (Patient not taking: No sig reported)    . chlorhexidine (PERIDEX) 0.12 % solution  (Patient not taking: Reported on 10/06/2020)    . HYDROcodone-acetaminophen (NORCO/VICODIN) 5-325 MG tablet Take 1-2 tablets by mouth every 4 (four) hours as needed for moderate pain or severe pain. (Patient not taking: No sig reported) 30 tablet 0  . nitroGLYCERIN (NITROSTAT) 0.4 MG SL tablet Place 1 tablet (0.4 mg total) under the tongue every 5 (five) minutes x 3 doses as needed for chest pain. (Patient not taking: No sig reported) 25 tablet 3  . FISH OIL-KRILL OIL PO Take by mouth. (Patient not taking: Reported on 10/06/2020)     No facility-administered medications prior to visit.    Allergies  Allergen Reactions  . Neosporin [Neomycin-Bacitracin Zn-Polymyx] Other (See Comments)    Patient states that wounds or scratches never heal. (pt uses bacitracin)   . Seasonal Ic [Cholestatin] Other (See Comments)    Leg cramps  . Triamcinolone Other (See Comments)    Burning sensation  . Atorvastatin Other (See Comments)    Leg cramps  . Monascus Purpureus Went Yeast Other (See Comments)    "Red Yeast Rice" causes leg cramps  . Pravastatin Other (See Comments)    Leg cramps    ROS As per HPI  PE: Vitals with BMI 10/06/2020 08/27/2020 07/28/2020  Height 5\' 7"  5\' 7"  5\' 7"   Weight 136 lbs 136 lbs 142 lbs 3 oz  BMI 21.3 [id number]  Systolic 0000000 [id number]   Diastolic 67 68 69  Pulse 55 51 52     Gen: Alert, well appearing.  Patient is oriented to person, place, time, and situation. AFFECT: pleasant, lucid thought and speech. No further exam today.  LABS:    Chemistry      Component Value Date/Time   NA 141 03/24/2020 0925   NA 141 08/29/2018 0000   K 4.3 03/24/2020 0925   CL 108 03/24/2020 0925   CO2 26 03/24/2020 0925   BUN 17 03/24/2020 0925   BUN 18 08/29/2018 0000   CREATININE 0.80 03/24/2020 0925   CREATININE 0.99 (H) 10/12/2019 1357      Component  Value Date/Time   CALCIUM 9.0 03/24/2020 0925   ALKPHOS 46 03/24/2020 0925   AST 21 03/24/2020 0925   ALT 14 03/24/2020 0925   BILITOT 0.4 03/24/2020 0925     Lab Results  Component Value Date   TSH 2.51 03/24/2020   Lab Results  Component Value Date   WBC 5.9 03/24/2020   HGB 11.9 (L) 03/24/2020   HCT 35.5 (L) 03/24/2020   MCV 93.9 03/24/2020   PLT 238.0 03/24/2020    IMPRESSION AND PLAN:  Short term memory impairment, mild.  Seems like this has been concerning her family for approx 18 mo.   Pt requests referral to neurologist for further expert evaluation: ordered referral to Dr. Margette Fast per her preference today.   An After Visit Summary was printed and given to the patient.  FOLLOW UP: Return for keep f/u appt set for May 2022 with me.  Signed:  Crissie Sickles, MD           10/06/2020

## 2020-10-10 ENCOUNTER — Ambulatory Visit (INDEPENDENT_AMBULATORY_CARE_PROVIDER_SITE_OTHER): Payer: Medicare Other | Admitting: Psychologist

## 2020-10-10 DIAGNOSIS — F320 Major depressive disorder, single episode, mild: Secondary | ICD-10-CM | POA: Diagnosis not present

## 2020-10-24 ENCOUNTER — Ambulatory Visit (INDEPENDENT_AMBULATORY_CARE_PROVIDER_SITE_OTHER): Payer: Medicare Other | Admitting: Psychologist

## 2020-10-24 DIAGNOSIS — F320 Major depressive disorder, single episode, mild: Secondary | ICD-10-CM

## 2020-11-06 ENCOUNTER — Ambulatory Visit (INDEPENDENT_AMBULATORY_CARE_PROVIDER_SITE_OTHER): Payer: Medicare Other | Admitting: Psychologist

## 2020-11-06 DIAGNOSIS — F320 Major depressive disorder, single episode, mild: Secondary | ICD-10-CM

## 2020-11-19 ENCOUNTER — Ambulatory Visit (INDEPENDENT_AMBULATORY_CARE_PROVIDER_SITE_OTHER): Payer: Medicare Other | Admitting: Psychologist

## 2020-11-19 DIAGNOSIS — F320 Major depressive disorder, single episode, mild: Secondary | ICD-10-CM | POA: Diagnosis not present

## 2020-12-03 DIAGNOSIS — Z6821 Body mass index (BMI) 21.0-21.9, adult: Secondary | ICD-10-CM | POA: Diagnosis not present

## 2020-12-03 DIAGNOSIS — M791 Myalgia, unspecified site: Secondary | ICD-10-CM | POA: Diagnosis not present

## 2020-12-03 DIAGNOSIS — Z79899 Other long term (current) drug therapy: Secondary | ICD-10-CM | POA: Diagnosis not present

## 2020-12-03 DIAGNOSIS — M0589 Other rheumatoid arthritis with rheumatoid factor of multiple sites: Secondary | ICD-10-CM | POA: Diagnosis not present

## 2020-12-04 ENCOUNTER — Ambulatory Visit: Payer: Medicare Other | Admitting: Psychologist

## 2020-12-24 DIAGNOSIS — Z23 Encounter for immunization: Secondary | ICD-10-CM | POA: Diagnosis not present

## 2020-12-25 ENCOUNTER — Ambulatory Visit (INDEPENDENT_AMBULATORY_CARE_PROVIDER_SITE_OTHER): Payer: Medicare Other | Admitting: Neurology

## 2020-12-25 ENCOUNTER — Encounter: Payer: Self-pay | Admitting: Neurology

## 2020-12-25 ENCOUNTER — Telehealth: Payer: Self-pay | Admitting: Neurology

## 2020-12-25 VITALS — BP 138/68 | HR 52

## 2020-12-25 DIAGNOSIS — E538 Deficiency of other specified B group vitamins: Secondary | ICD-10-CM | POA: Diagnosis not present

## 2020-12-25 DIAGNOSIS — R413 Other amnesia: Secondary | ICD-10-CM

## 2020-12-25 DIAGNOSIS — R202 Paresthesia of skin: Secondary | ICD-10-CM

## 2020-12-25 DIAGNOSIS — G3184 Mild cognitive impairment, so stated: Secondary | ICD-10-CM | POA: Insufficient documentation

## 2020-12-25 HISTORY — DX: Other amnesia: R41.3

## 2020-12-25 NOTE — Telephone Encounter (Signed)
Medicare order sent to GI. No auth they will reach out to the patient to schedule.  

## 2020-12-25 NOTE — Progress Notes (Signed)
Reason for visit: Mild cognitive impairment  Referring physician: Dr. Georgianne Fick D Bonnie [name] is a 80 y.o. female  History of present illness:  Bonnie [name] is a 80 year old right-handed white female with a history of problems with mild memory changes.  The patient has not personally noted any problems with her memory, but her son apparently told her that she was repeating herself and that she may have some memory issues.  The patient lives with her husband who has had a stroke and has some language deficits associated with this.  The patient usually does not operate a motor vehicle but if she has to drive, she can do this.  She has not had any problems with directions with driving.  The patient is able to function with doing the finances.  The patient is organized, she keeps a calendar for appointments, she is able to manage her medications well.  She has always had trouble remembering names for people, this has not changed.  The patient denies that she has any short-term memory issues, she can remember events and conversations that occurred recently.  At times, the patient not sleep well at night, but she usually has a good energy level during the day.  She has over the last 6 months or so had a sensation of altered balance, within the last month or 2 she has reported some tingling in the toes of the feet.  The patient at times feels stressed for possibly slightly depressed.  She claims that her mother in her 90s had problems with memory but it was felt related to a stroke problems.  The patient does have a history of rheumatoid arthritis, she is on methotrexate for this and is followed by Dr. Amil Amen.  The patient is sent to this office for further evaluation.  Past Medical History:  Diagnosis Date  . Beta thalassemia, heterozygous    ?alpha? pt does not recall.  Marland Kitchen CAD (coronary artery disease) 07/2018   NSTEMI (small vessel occlusion, not amenable to intervention)-->2V CAD on cath, preserved  LV systolic fxn.  . Diastolic dysfunction 93/2671  . Fatigue   . GERD (gastroesophageal reflux disease)   . Hemorrhoids   . History of adenomatous polyp of colon   . Hyperlipidemia   . 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  Surgical path NEG for atypica or malignancy.  . Memory difficulties 12/25/2020  . NSTEMI (non-ST elevated myocardial infarction) ([place]) 07/2018   Dr. Percival Spanish  . Palpitations    08/2018 Event monitor normal. Long term event monitor 05/2020 w/out signif arrhythmia--some brief SVT.  Marland Kitchen Rheumatoid arthritis ([place]) 2007   muscle weaknes at times (Dr. Amil Amen)  . Tinnitus, bilateral 2021   with high frequ SSHL->reassured by Ent    Past Surgical History:  Procedure Laterality Date  . ABDOMINAL HYSTERECTOMY  1987   DUB, no malignancy.  Ovaries still in.  Marland Kitchen BLADDER REPAIR    . Cardiac Event Monitor  08/22/2018   NO arrhythmias.  Her symptomatic periods coresponded to NSR.  Marland Kitchen CHOLECYSTECTOMY  1969  . COLONOSCOPY  11/07/2017   2014 adenoma.  Rpt 10/2017 adenoma x 1.  No further colonoscopies needed per GI.   Marland Kitchen LEFT HEART CATH AND CORONARY ANGIOGRAPHY N/A 08/08/2018   Procedure: LEFT HEART CATH AND CORONARY ANGIOGRAPHY;  Surgeon: Troy Sine, MD;  Location: [facility];  Service: Cardiovascular;  Laterality: N/A;  . THYROIDECTOMY  2012   cytologic atypia on nodule bx; surgical path NEG for atypica or malignancy.  . TRANSTHORACIC ECHOCARDIOGRAM  08/07/2018   EF 60-65%, no wall motion abnl, grd II DD, mod MR.  Marland Kitchen UPPER GASTROINTESTINAL ENDOSCOPY    . VAGINAL PROLAPSE REPAIR  2012    Family History  Problem Relation Age of Onset  . Hypertension Mother   . CVA Mother   . Colon cancer Neg Hx   . Esophageal cancer Neg Hx   . Stomach cancer Neg Hx   . Rectal cancer Neg Hx     Social history:   reports that she quit smoking about 47 years ago. Her smoking use included cigarettes. She has a 5.00 pack-year smoking history. She has never used smokeless tobacco. She reports current alcohol use. She reports that she does not use drugs.  Medications:  Prior to Admission medications   Medication Sig Start Date End Date Taking? Authorizing Provider  acetaminophen (TYLENOL) 500 MG tablet Take 500 mg by mouth every 6 (six) hours as needed for headache (pain).   Yes [provider]  aspirin EC 81 MG tablet Take 81 mg by mouth at bedtime.   Yes [provider]  Calcium Citrate-Vitamin D (CALCIUM CITRATE + D PO) Take 1 tablet by mouth 2 (two) times daily.    Yes [provider]  celecoxib (CELEBREX) 200 MG capsule Take by mouth daily. 10/12/18  Yes [provider]  chlorhexidine (PERIDEX) 0.12 % solution  10/11/18  Yes [provider]  folic acid (FOLVITE) 1 MG tablet Take 1 mg by mouth 2 (two) times daily.   Yes [provider]  HYDROcodone-acetaminophen (NORCO/VICODIN) 5-325 MG tablet Take 1-2 tablets by mouth every 4 (four) hours as needed for moderate pain or severe pain. 09/14/18  Yes McGowen, Adrian Blackwater, MD  isosorbide mononitrate (IMDUR) 60 MG 24 hr tablet Take 1 tablet (60 mg total) by mouth daily. 12/24/19  Yes Minus Breeding, MD  levothyroxine (SYNTHROID) 88 MCG tablet Take 1 tablet (88 mcg total) by mouth daily before breakfast. 02/18/20  Yes McGowen, Adrian Blackwater, MD  LORazepam (ATIVAN) 0.5 MG tablet 1-2 tabs po bid prn anxiety 02/28/19  Yes McGowen, Adrian Blackwater, MD  methotrexate 2.5 MG tablet Take 12.5 mg by mouth every Sunday.    Yes [provider]  Multiple Vitamin (MULTIVITAMIN WITH MINERALS) TABS tablet Take 1 tablet by mouth daily.   Yes [provider]  nitroGLYCERIN (NITROSTAT) 0.4 MG SL tablet Place 1 tablet (0.4 mg total) under the tongue every 5 (five) minutes x 3 doses as needed for chest pain. 08/08/18  Yes Kroeger,  Lorelee Cover., PA-C  Omega-3 Fatty Acids (FISH OIL PO) Take by mouth daily.   Yes [provider]  potassium chloride (KLOR-CON) 8 MEQ tablet Take 2 tablets (16 mEq total) by mouth daily. 06/03/20  Yes Minus Breeding, MD  rosuvastatin (CRESTOR) 40 MG tablet Take 1 tablet (40 mg total) by mouth daily at 6 PM. 05/26/20  Yes Hochrein, Jeneen Rinks, MD  Coenzyme Q10 (COQ10 PO) Take by mouth.    [provider]      Allergies  Allergen Reactions  . Neosporin [Neomycin-Bacitracin Zn-Polymyx] Other (See Comments)    Patient states that wounds or scratches never heal. (pt uses bacitracin)   . Seasonal Ic [Cholestatin] Other (See Comments)    Leg cramps  . Triamcinolone Other (See Comments)    Burning sensation  .  Atorvastatin Other (See Comments)    Leg cramps  . Monascus Purpureus Went Yeast Other (See Comments)    "Red Yeast Rice" causes leg cramps  . Pravastatin Other (See Comments)    Leg cramps    ROS:  Out of a complete 14 system review of symptoms, the patient complains only of the following symptoms, and all other reviewed systems are negative.  Numbness in the toes Mild balance changes Memory issues  Blood pressure 138/68, pulse (!) 52.  Physical Exam  General: The patient is alert and cooperative at the time of the examination.  Eyes: Pupils are equal, round, and reactive to light. Discs are flat bilaterally.  Neck: The neck is supple, no carotid bruits are noted.  Respiratory: The respiratory examination is clear.  Cardiovascular: The cardiovascular examination reveals a regular rate and rhythm, no obvious murmurs or rubs are noted.  Skin: Extremities are without significant edema.  Neurologic Exam  Mental status: The patient is alert and oriented x 3 at the time of the examination. The Mini-Mental status examination done today shows a total score 28/30.  Cranial nerves: Facial symmetry is present. There is good sensation of the face to pinprick and soft  touch bilaterally. The strength of the facial muscles and the muscles to head turning and shoulder shrug are normal bilaterally. Speech is well enunciated, no aphasia or dysarthria is noted. Extraocular movements are full. Visual fields are full. The tongue is midline, and the patient has symmetric elevation of the soft palate. No obvious hearing deficits are noted.  Motor: The motor testing reveals 5 over 5 strength of all 4 extremities. Good symmetric motor tone is noted throughout.  Sensory: Sensory testing is intact to pinprick, soft touch, vibration sensation, and position sense on all 4 extremities.  No definite stocking pattern pinprick sensory deficit was noted.  No evidence of extinction is noted.  Coordination: Cerebellar testing reveals good finger-nose-finger and heel-to-shin bilaterally.  Gait and station: Gait is normal. Tandem gait is normal. Romberg is negative. No drift is seen.  Reflexes: Deep tendon reflexes are symmetric and normal bilaterally, but the ankle jerk reflexes are depressed bilaterally. Toes are downgoing bilaterally.   Assessment/Plan:  1.  Mild cognitive impairment  2.  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  The patient will be sent for MRI of the brain, she will have blood work done looking for etiologies of numbness in the feet.  The patient will follow up here in 6 months.  We may consider adding a medication for memory in the future.  Currently, the patient is functioning fairly well.  Jill Alexanders MD 12/25/2020 9:19 AM  [facility] [address] [place], [place] [address]  Phone [phone] Fax [phone]

## 2020-12-29 ENCOUNTER — Ambulatory Visit
Admission: RE | Admit: 2020-12-29 | Discharge: 2020-12-29 | Disposition: A | Discharge status: Home / Self Care | Payer: Medicare Other | Source: Ambulatory Visit | Attending: Neurology | Admitting: Neurology

## 2020-12-29 ENCOUNTER — Other Ambulatory Visit: Payer: Self-pay

## 2020-12-29 DIAGNOSIS — R413 Other amnesia: Secondary | ICD-10-CM

## 2020-12-29 DIAGNOSIS — R202 Paresthesia of skin: Secondary | ICD-10-CM

## 2020-12-30 ENCOUNTER — Telehealth: Payer: Self-pay | Admitting: Neurology

## 2020-12-30 MED ORDER — DONEPEZIL HCL 5 MG PO TABS: 5.0000 mg | ORAL_TABLET | Freq: Every day | ORAL | 1 refills | Status: DC

## 2020-12-30 NOTE — Telephone Encounter (Signed)
I called the patient.  The MRI of the brain does show generalized atrophy with involvement of the mesial temporal lobes, the patient could potentially be developing an Alzheimer's process.  Mild white matter changes are seen.  The patient is amenable to getting on a medication for memory, I will send in a prescription for Aricept.   MRI brain 12/30/20:  IMPRESSION: This MRI of the brain without contrast shows the following: 1.   Mild to moderate generalized cortical atrophy most pronounced in the medial temporal lobes.  The atrophy has occurred since the 04/04/2012 MRI when brain volume was normal for age. 2.   Some scattered T2/FLAIR hyperintense foci in the hemispheres consistent with mild chronic microvascular ischemic change, just minimally progressed compared to the 2013 MRI. 3.   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  Both of these have occurred since 2013 MRI.  Such a small number of chronic microhemorrhages is unlikely to be clinically significant. 4.   No acute findings.

## 2020-12-31 ENCOUNTER — Telehealth: Payer: Self-pay | Admitting: Neurology

## 2020-12-31 LAB — MULTIPLE MYELOMA PANEL, SERUM
Albumin SerPl Elph-Mcnc: 3.5 g/dL (ref 2.9–4.4)
Albumin/Glob SerPl: 1.1 (ref 0.7–1.7)
Alpha 1: 0.3 g/dL (ref 0.0–0.4)
Alpha2 Glob SerPl Elph-Mcnc: 1.1 g/dL — ABNORMAL HIGH (ref 0.4–1.0)
B-Globulin SerPl Elph-Mcnc: 1.1 g/dL (ref 0.7–1.3)
Gamma Glob SerPl Elph-Mcnc: 0.9 g/dL (ref 0.4–1.8)
Globulin, Total: 3.4 g/dL (ref 2.2–3.9)
IgA/Immunoglobulin A, Serum: 298 mg/dL (ref 64–422)
IgG (Immunoglobin G), Serum: 921 mg/dL (ref 586–1602)
IgM (Immunoglobulin M), Srm: 49 mg/dL (ref 26–217)
Total Protein: 6.9 g/dL (ref 6.0–8.5)

## 2020-12-31 LAB — ANGIOTENSIN CONVERTING ENZYME: Angio Convert Enzyme: 28 U/L (ref 14–82)

## 2020-12-31 LAB — COPPER, SERUM: Copper: 110 ug/dL (ref 80–158)

## 2020-12-31 LAB — VITAMIN B12: Vitamin B-12: 527 pg/mL (ref 232–1245)

## 2020-12-31 LAB — HEPATITIS C ANTIBODY: Hep C Virus Ab: <0.1 s/co ratio (ref 0.0–0.9)

## 2020-12-31 LAB — B. BURGDORFI ANTIBODIES: Lyme IgG/IgM Ab: <0.91 {ISR} (ref 0.00–0.90)

## 2020-12-31 NOTE — Telephone Encounter (Signed)
I called the patient.  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  Lambda chain.  The IgA monoclonal antibody usually do not cause neuropathy.  I would recommend an oncology referral, if the patient is okay with this she is to let us know.

## 2021-01-02 ENCOUNTER — Other Ambulatory Visit: Payer: Self-pay | Admitting: Cardiology

## 2021-01-07 ENCOUNTER — Other Ambulatory Visit: Payer: Self-pay

## 2021-01-07 ENCOUNTER — Ambulatory Visit
Admission: RE | Admit: 2021-01-07 | Discharge: 2021-01-07 | Disposition: A | Discharge status: Home / Self Care | Payer: Medicare Other | Source: Ambulatory Visit | Attending: Family Medicine | Admitting: Family Medicine

## 2021-01-07 DIAGNOSIS — Z780 Asymptomatic menopausal state: Secondary | ICD-10-CM | POA: Diagnosis not present

## 2021-01-07 DIAGNOSIS — M810 Age-related osteoporosis without current pathological fracture: Secondary | ICD-10-CM | POA: Diagnosis not present

## 2021-01-11 DIAGNOSIS — M810 Age-related osteoporosis without current pathological fracture: Secondary | ICD-10-CM

## 2021-01-11 HISTORY — PX: OTHER SURGICAL HISTORY: SHX169

## 2021-01-11 HISTORY — DX: Age-related osteoporosis without current pathological fracture: M81.0

## 2021-01-12 DIAGNOSIS — H5203 Hypermetropia, bilateral: Secondary | ICD-10-CM | POA: Diagnosis not present

## 2021-01-12 DIAGNOSIS — H524 Presbyopia: Secondary | ICD-10-CM | POA: Diagnosis not present

## 2021-01-12 DIAGNOSIS — H2513 Age-related nuclear cataract, bilateral: Secondary | ICD-10-CM | POA: Diagnosis not present

## 2021-01-12 DIAGNOSIS — H52223 Regular astigmatism, bilateral: Secondary | ICD-10-CM | POA: Diagnosis not present

## 2021-01-14 ENCOUNTER — Encounter: Payer: Self-pay | Admitting: Family Medicine

## 2021-01-15 ENCOUNTER — Other Ambulatory Visit: Payer: Self-pay

## 2021-01-15 MED ORDER — ALENDRONATE SODIUM 70 MG PO TABS
70.0000 mg | ORAL_TABLET | ORAL | 11 refills | Status: DC
Start: 2021-01-15 — End: 2022-02-22

## 2021-01-16 ENCOUNTER — Telehealth: Payer: Self-pay

## 2021-01-16 NOTE — Telephone Encounter (Signed)
Spoke with pt regarding clarification for bone density results and alendronate(fosamax) sent in to [facility]. She had concerns regarding aricept and advised to contact Dr.Willis' office since he prescribes this med.  [facility] [facility] - Client Nonclinical Telephone Record  [facility] Client [place] [facility] - Client Client Site [place] - Day Physician [facility] - PHYSICIAN, Verita Schneiders- MD Contact Type Call Who Is Calling Patient / Member / Family / Caregiver Caller Name Janaria Mccammon Phone Number [phone] Patient Name Bonnie [name] Patient DOB Jun 12, 1941 Call Type Message Only Information Provided Reason for Call Returning a Call from the Office Initial Daguao states she just got a call regarding calcium being prescribed for her. Is the doctor going to call it in? Was it called in to [facility] Time Disposition Final User 01/15/2021 5:20:13 PM General Information Provided Yes Sharia Reeve Call Closed By: Sharia Reeve Transaction Date/Time: 01/15/2021 5:17:37 PM (ET)

## 2021-01-22 ENCOUNTER — Other Ambulatory Visit: Payer: Self-pay | Admitting: Neurology

## 2021-01-26 ENCOUNTER — Ambulatory Visit: Payer: Medicare Other | Admitting: Family Medicine

## 2021-02-02 ENCOUNTER — Other Ambulatory Visit: Payer: Self-pay

## 2021-02-03 ENCOUNTER — Ambulatory Visit (INDEPENDENT_AMBULATORY_CARE_PROVIDER_SITE_OTHER): Payer: Medicare Other | Admitting: Family Medicine

## 2021-02-03 ENCOUNTER — Other Ambulatory Visit: Payer: Self-pay

## 2021-02-03 ENCOUNTER — Encounter: Payer: Self-pay | Admitting: Family Medicine

## 2021-02-03 VITALS — BP 124/77 | HR 55 | Temp 97.8°F | Ht 67.0 in | Wt 135.0 lb

## 2021-02-03 DIAGNOSIS — F411 Generalized anxiety disorder: Secondary | ICD-10-CM | POA: Diagnosis not present

## 2021-02-03 DIAGNOSIS — E7800 Pure hypercholesterolemia, unspecified: Secondary | ICD-10-CM | POA: Diagnosis not present

## 2021-02-03 DIAGNOSIS — D472 Monoclonal gammopathy: Secondary | ICD-10-CM

## 2021-02-03 DIAGNOSIS — I2510 Atherosclerotic heart disease of native coronary artery without angina pectoris: Secondary | ICD-10-CM | POA: Diagnosis not present

## 2021-02-03 DIAGNOSIS — M810 Age-related osteoporosis without current pathological fracture: Secondary | ICD-10-CM | POA: Diagnosis not present

## 2021-02-03 DIAGNOSIS — E039 Hypothyroidism, unspecified: Secondary | ICD-10-CM

## 2021-02-03 LAB — COMPREHENSIVE METABOLIC PANEL
ALT: 12 U/L (ref 0–35)
AST: 19 U/L (ref 0–37)
Albumin: 4.1 g/dL (ref 3.5–5.2)
Alkaline Phosphatase: 50 U/L (ref 39–117)
BUN: 17 mg/dL (ref 6–23)
CO2: 28 mEq/L (ref 19–32)
Calcium: 9.3 mg/dL (ref 8.4–10.5)
Chloride: 106 mEq/L (ref 96–112)
Creatinine, Ser: 0.78 mg/dL (ref 0.40–1.20)
GFR: 72.62 mL/min (ref >60.00)
Glucose, Bld: 83 mg/dL (ref 70–99)
Potassium: 4.1 mEq/L (ref 3.5–5.1)
Sodium: 143 mEq/L (ref 135–145)
Total Bilirubin: 0.6 mg/dL (ref 0.2–1.2)
Total Protein: 6.9 g/dL (ref 6.0–8.3)

## 2021-02-03 LAB — VITAMIN D 25 HYDROXY (VIT D DEFICIENCY, FRACTURES): VITD: 49.15 ng/mL (ref 30.00–100.00)

## 2021-02-03 LAB — TSH: TSH: 0.64 u[IU]/mL (ref 0.35–4.50)

## 2021-02-03 MED ORDER — LORAZEPAM 0.5 MG PO TABS: ORAL_TABLET | ORAL | 1 refills | Status: DC

## 2021-02-03 NOTE — Patient Instructions (Signed)
Buy oscal+D and take 1 twice a day (calcium and vit D supplement).

## 2021-02-03 NOTE — Progress Notes (Signed)
OFFICE VISIT  02/03/2021  CC:  Chief Complaint  Patient presents with  . Follow-up    HPI:    Patient is a 80 y.o. Caucasian female who presents accompanied by her husband Vinnie for 6 mo f/u hypothyroidism, hyperlipidemia, anxiety, hx of DD. She has CAD w/ hx of NSTEMI, no angioplasty or stent, reserved LV fxn. A/P as of last visit: "1) Hypothyroidism: doing well.  Cont 88 mcg T4 qd. Next TSH in 6 mo.   2) Hyperlipidemia: tolerating rosuva 40mg  qd. Lipids all excellent 4 mo ago. Plan FLP and hepatic panel in 6 mo.  3) GAD: she is using benzo appropriately. No new rx for this med was needed today.  4) CAD, hx of STEMI, DD. Asymptomatic.  Cont ASA, imdur, statin. No BB b/c sinus bradycardia. Has NTG but has not needed this.  5) Preventative healthcare: Flu vaccine today. All other vaccines UTD. I ordered DEXA and mammogram 02/2020 but she got both of these done since last o/v with me--> through her GYN MD and reports they were both normal."  INTERIM HX: She is quite anxious all the time, says she rarely takes lorazepam but has had no side effect when she does take it.  She is apprehensive about everything to do with her health, meds, etc. She takes a walk in the park most days, very busy in her home---never stops moving.  Occ home bp check consistently <130/80.  HR typically 50s.  Hypoth: Takes T4 on empty stomach w/out any other meds.  Since I last saw her she saw Dr. Jannifer Franklin in neuro for memory issues.  He diagnosed her with mild cognitive impairment and did blood w/u for possible early periph neuropathy.  He started her on aricept 5mg  qd. He did an MRI brain, results noted in "Labs" section below.  Blood w/u all normal except IgA monoclonal antibody and neuro notes state pt was referred to hematology for this but pt has no knowledge of this today and I see no appt in EMR. She states she can't tell any diff in memory/cognitive function, possibly some mild dizziness d/t  the med.   Osteoporosis: T score 2 weeks ago was -2.7 and I recommended she start alendronate. No adverse effects so far.  PMP AWARE reviewed today: most recent rx for lorazepam 0.5mg  was filled 02/28/19, # 19, rx by me. No red flags.  ROS as above, plus--> no fevers, no CP, no SOB, no wheezing, no cough, no HAs, no rashes, no melena/hematochezia.  No polyuria or polydipsia.  No myalgias or arthralgias.  No focal weakness, paresthesias, or tremors.  No acute vision or hearing abnormalities.  No dysuria or unusual/new urinary urgency or frequency.  No recent changes in lower legs. No n/v/d or abd pain.  No palpitations.     Past Medical History:  Diagnosis Date  . Beta thalassemia, heterozygous    ?alpha? pt does not recall.  Marland Kitchen CAD (coronary artery disease) 07/2018   NSTEMI (small vessel occlusion, not amenable to intervention)-->2V CAD on cath, preserved LV systolic fxn.  . Diastolic dysfunction 48/0165  . Fatigue   . GERD (gastroesophageal reflux disease)   . Hemorrhoids   . History of adenomatous polyp of colon   . Hyperlipidemia   . 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  Surgical path NEG for atypica or malignancy.  . Memory difficulties  12/25/2020  . NSTEMI (non-ST elevated myocardial infarction) ([place]) 07/2018   Dr. Percival Spanish  . Osteoporosis 01/2021   01/2021 T scoree -2.7  . Palpitations    08/2018 Event monitor normal. Long term event monitor 05/2020 w/out signif arrhythmia--some brief SVT.  Marland Kitchen Rheumatoid arthritis ([place]) 2007   muscle weaknes at times (Dr. Amil Amen)  . Tinnitus, bilateral 2021   with high frequ SSHL->reassured by Ent    Past Surgical History:  Procedure Laterality Date  . ABDOMINAL HYSTERECTOMY  1987   DUB, no malignancy.  Ovaries still in.  Marland Kitchen BLADDER REPAIR    . Cardiac Event Monitor   08/22/2018   NO arrhythmias.  Her symptomatic periods coresponded to NSR.  Marland Kitchen CHOLECYSTECTOMY  1969  . COLONOSCOPY  11/07/2017   2014 adenoma.  Rpt 10/2017 adenoma x 1.  No further colonoscopies needed per GI.   Marland Kitchen DEXA  01/2021   T score -2.7  . LEFT HEART CATH AND CORONARY ANGIOGRAPHY N/A 08/08/2018   Procedure: LEFT HEART CATH AND CORONARY ANGIOGRAPHY;  Surgeon: Troy Sine, MD;  Location: [facility];  Service: Cardiovascular;  Laterality: N/A;  . THYROIDECTOMY  2012   cytologic atypia on nodule bx; surgical path NEG for atypica or malignancy.  . TRANSTHORACIC ECHOCARDIOGRAM  08/07/2018   EF 60-65%, no wall motion abnl, grd II DD, mod MR.  Marland Kitchen UPPER GASTROINTESTINAL ENDOSCOPY    . VAGINAL PROLAPSE REPAIR  2012    Outpatient Medications Prior to Visit  Medication Sig Dispense Refill  . acetaminophen (TYLENOL) 500 MG tablet Take 500 mg by mouth every 6 (six) hours as needed for headache (pain).    Marland Kitchen alendronate (FOSAMAX) 70 MG tablet Take 1 tablet (70 mg total) by mouth once a week. Take with a full glass of water on an empty stomach. 4 tablet 11  . aspirin EC 81 MG tablet Take 81 mg by mouth at bedtime.    . Calcium Citrate-Vitamin D (CALCIUM CITRATE + D PO) Take 1 tablet by mouth 2 (two) times daily.     . celecoxib (CELEBREX) 200 MG capsule Take by mouth daily.    . chlorhexidine (PERIDEX) 0.12 % solution     . Coenzyme Q10 (CO Q 10) 60 MG CAPS 1 capsule with a meal    . diphenhydrAMINE (BENADRYL) 25 mg capsule 1 capsule as needed    . donepezil (ARICEPT) 5 MG tablet Take 1 tablet (5 mg total) by mouth at bedtime. 30 tablet 1  . fexofenadine (ALLEGRA) 180 MG tablet 1 tablet as needed    . folic acid (FOLVITE) 1 MG tablet Take 1 mg by mouth 2 (two) times daily.    Marland Kitchen HYDROcodone-acetaminophen (NORCO/VICODIN) 5-325 MG tablet Take 1-2 tablets by mouth every 4 (four) hours as needed for moderate pain or severe pain. 30 tablet 0  . isosorbide mononitrate (IMDUR) 60 MG 24 hr tablet  TAKE 1 TABLET BY MOUTH EVERY DAY 90 tablet 0  . levothyroxine (SYNTHROID) 88 MCG tablet Take 1 tablet (88 mcg total) by mouth daily before breakfast. 90 tablet 3  . methotrexate 2.5 MG tablet Take 12.5 mg by mouth every Sunday.     . Multiple Vitamin (MULTIVITAMIN WITH MINERALS) TABS tablet Take 1 tablet by mouth daily.    . nitroGLYCERIN (NITROSTAT) 0.4 MG SL tablet Place 1 tablet (0.4 mg total) under the tongue every 5 (five) minutes x 3 doses as needed for chest pain. 25 tablet 3  . Omega-3 Fatty Acids (FISH OIL  PO) Take by mouth daily.    [place] Calcium 500 MG TABS 1 tablet with meals    . potassium chloride (KLOR-CON) 8 MEQ tablet Take 2 tablets (16 mEq total) by mouth daily. 180 tablet 3  . rosuvastatin (CRESTOR) 40 MG tablet Take 1 tablet (40 mg total) by mouth daily at 6 PM. 90 tablet 3  . cholecalciferol (VITAMIN D3) 25 MCG (1000 UNIT) tablet 1 tablet    . LORazepam (ATIVAN) 0.5 MG tablet 1-2 tabs po bid prn anxiety 60 tablet 1  . aspirin 81 MG chewable tablet 1 tablet (Patient not taking: Reported on 02/03/2021)    . Estradiol 10 MCG TABS vaginal tablet 1 tablet (Patient not taking: Reported on 02/03/2021)    . vitamin B-12 (CYANOCOBALAMIN) 1000 MCG tablet 1 tablet (Patient not taking: Reported on 02/03/2021)     No facility-administered medications prior to visit.    Allergies  Allergen Reactions  . Neosporin [Neomycin-Bacitracin Zn-Polymyx] Other (See Comments)    Patient states that wounds or scratches never heal. (pt uses bacitracin)   . Other     Other reaction(s): Unknown  . Seasonal Ic [Cholestatin] Other (See Comments)    Leg cramps  . Triamcinolone Other (See Comments)    Burning sensation  . Atorvastatin Other (See Comments)    Leg cramps  . Monascus Purpureus Went Yeast Other (See Comments)    "Red Yeast Rice" causes leg cramps  . Pravastatin Other (See Comments)    Leg cramps    ROS As per HPI  PE: Vitals with BMI 02/03/2021 12/25/2020 10/06/2020   Height 5\' 7"  - 5\' 7"   Weight 135 lbs - 136 lbs  BMI 42.59 - 56.3  Systolic 875 643 329  Diastolic 77 68 67  Pulse 55 52 55     Gen: Alert, well appearing.  Patient is oriented to person, place, time, and situation. AFFECT: pleasant, lucid thought and speech. CV: RRR, trace systolic murmur, no diastolic murmur.  No r/g.   LUNGS: CTA bilat, nonlabored resps, good aeration in all lung fields. EXT: no clubbing or cyanosis.  no edema.    LABS:  Lab Results  Component Value Date   TSH 2.51 03/24/2020   Lab Results  Component Value Date   WBC 5.9 03/24/2020   HGB 11.9 (L) 03/24/2020   HCT 35.5 (L) 03/24/2020   MCV 93.9 03/24/2020   PLT 238.0 03/24/2020   Lab Results  Component Value Date   IRON 67 06/14/2012   Lab Results  Component Value Date   VITAMINB12 527 12/25/2020   Lab Results  Component Value Date   CREATININE 0.80 03/24/2020   BUN 17 03/24/2020   NA 141 03/24/2020   K 4.3 03/24/2020   CL 108 03/24/2020   CO2 26 03/24/2020   Lab Results  Component Value Date   ALT 14 03/24/2020   AST 21 03/24/2020   ALKPHOS 46 03/24/2020   BILITOT 0.4 03/24/2020   Lab Results  Component Value Date   CHOL 137 03/24/2020   Lab Results  Component Value Date   HDL 68.20 03/24/2020   Lab Results  Component Value Date   LDLCALC 59 03/24/2020   Lab Results  Component Value Date   TRIG 48.0 03/24/2020   Lab Results  Component Value Date   CHOLHDL 2 03/24/2020   Lab Results  Component Value Date   HGBA1C 5.7 (H) 08/07/2018   MR brain w/o contrast 12/29/20: IMPRESSION: This MRI of the brain without  contrast shows the following: 1.   Mild to moderate generalized cortical atrophy most pronounced in the medial temporal lobes.  The atrophy has occurred since the 04/04/2012 MRI when brain volume was normal for age. 2.   Some scattered T2/FLAIR hyperintense foci in the hemispheres consistent with mild chronic microvascular ischemic change, just minimally progressed  compared to the 2013 MRI. 3.   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  Both of these have occurred since 2013 MRI.  Such a small number of chronic microhemorrhages is unlikely to be clinically significant. 4.   No acute findings.  IMPRESSION AND PLAN:  1) HLD: tolerating rosuva 40mg  qd. Goal LDL <70.  Last LDL was 59 on 03/24/20. She is not fasting today.  Plan rpt flp and hepatic panel next f/u in 76mo.  2) Hypothyroidism: 88 mcg T4 qd, taking correctly. Monitor TSH today.  3) GAD: she went through some counseling and pt states she was told by the counselor that she does not need to continue going.  I encouraged pt to use her lorazepam more frequently--take 1-2 0.5mg  pills bid prn (new rx for #60, RF x1 given today).  4) Mild cognitive impairment. No imp with aricept started recently by Dr. Jannifer Franklin. She has f/u arranged for 06/2021 with him.  5) Osteoporosis: Tolerating alendronate just recently started. She's taking calcium but not vit D (she's not quite sure). Encouraged her to purchase oscal+d and take bie. Vit D and calcium level check today.  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   Hem/onc ref ordered (IgA monoclonal gammopathy) today.  An After Visit Summary was printed and given to the patient.  FOLLOW UP: Return in about 6 months (around 08/06/2021) for routine chronic illness f/u.  Signed:  Crissie Sickles, MD           02/03/2021

## 2021-02-04 ENCOUNTER — Other Ambulatory Visit: Payer: Self-pay | Admitting: Family Medicine

## 2021-02-05 ENCOUNTER — Telehealth: Payer: Self-pay | Admitting: *Deleted

## 2021-02-05 NOTE — Telephone Encounter (Signed)
Per referral 02/03/21 Dr. Anitra Lauth - called and gave upcoming appointments - mailed calendar with welcome packet

## 2021-02-11 ENCOUNTER — Telehealth: Payer: Self-pay | Admitting: Neurology

## 2021-02-11 NOTE — Telephone Encounter (Signed)
Patient states she is unable to sleep due to vivid dreams and feels like she is having heart palpitations and she is also feels she is depressed when she is always happy.  Patient understands Dr. Jannifer Franklin is not in the office this week however, she is willing to wait for Dr. Jannifer Franklin to call her.  Patient denied further questions, verbalized understanding and expressed appreciation for the phone call.

## 2021-02-11 NOTE — Telephone Encounter (Signed)
I called the patient.  The patient is having vivid dreams and having a sensation of rocking on Aricept since she started 6 weeks ago, she is not sleeping well at night.  She will stop the Aricept, if she decides that she wishes to go on another medication, may try Namenda in the future.  She will be seeing Dr. Marin Olp for her monoclonal antibody evaluation in the future.

## 2021-02-11 NOTE — Telephone Encounter (Signed)
donepezil (ARICEPT) 5 MG tablet, please call.

## 2021-02-22 ENCOUNTER — Other Ambulatory Visit: Payer: Self-pay | Admitting: Neurology

## 2021-02-24 ENCOUNTER — Inpatient Hospital Stay (HOSPITAL_BASED_OUTPATIENT_CLINIC_OR_DEPARTMENT_OTHER): Payer: Medicare Other | Admitting: Hematology & Oncology

## 2021-02-24 ENCOUNTER — Other Ambulatory Visit: Payer: Self-pay

## 2021-02-24 ENCOUNTER — Telehealth: Payer: Self-pay

## 2021-02-24 ENCOUNTER — Inpatient Hospital Stay: Payer: Medicare Other | Attending: Hematology & Oncology

## 2021-02-24 ENCOUNTER — Encounter: Payer: Self-pay | Admitting: Hematology & Oncology

## 2021-02-24 VITALS — BP 131/66 | HR 46 | Temp 98.3°F | Resp 18 | Ht 67.0 in | Wt 133.0 lb

## 2021-02-24 DIAGNOSIS — D472 Monoclonal gammopathy: Secondary | ICD-10-CM

## 2021-02-24 LAB — CMP (CANCER CENTER ONLY)
ALT: 10 U/L (ref 0–44)
AST: 17 U/L (ref 15–41)
Albumin: 3.9 g/dL (ref 3.5–5.0)
Alkaline Phosphatase: 46 U/L (ref 38–126)
Anion gap: 7 (ref 5–15)
BUN: 16 mg/dL (ref 8–23)
CO2: 25 mmol/L (ref 22–32)
Calcium: 9.5 mg/dL (ref 8.9–10.3)
Chloride: 108 mmol/L (ref 98–111)
Creatinine: 0.7 mg/dL (ref 0.44–1.00)
GFR, Estimated: >60 mL/min (ref >60)
Glucose, Bld: 99 mg/dL (ref 70–99)
Potassium: 4.2 mmol/L (ref 3.5–5.1)
Sodium: 140 mmol/L (ref 135–145)
Total Bilirubin: 0.6 mg/dL (ref 0.3–1.2)
Total Protein: 6.4 g/dL — ABNORMAL LOW (ref 6.5–8.1)

## 2021-02-24 LAB — CBC WITH DIFFERENTIAL (CANCER CENTER ONLY)
Abs Immature Granulocytes: 0.02 10*3/uL (ref 0.00–0.07)
Basophils Absolute: 0.1 10*3/uL (ref 0.0–0.1)
Basophils Relative: 1 %
Eosinophils Absolute: 0.1 10*3/uL (ref 0.0–0.5)
Eosinophils Relative: 2 %
HCT: 36 % (ref 36.0–46.0)
Hemoglobin: 11.7 g/dL — ABNORMAL LOW (ref 12.0–15.0)
Immature Granulocytes: 0 %
Lymphocytes Relative: 23 %
Lymphs Abs: 1.2 10*3/uL (ref 0.7–4.0)
MCH: 30.4 pg (ref 26.0–34.0)
MCHC: 32.5 g/dL (ref 30.0–36.0)
MCV: 93.5 fL (ref 80.0–100.0)
Monocytes Absolute: 0.4 10*3/uL (ref 0.1–1.0)
Monocytes Relative: 8 %
Neutro Abs: 3.5 10*3/uL (ref 1.7–7.7)
Neutrophils Relative %: 66 %
Platelet Count: 238 10*3/uL (ref 150–400)
RBC: 3.85 MIL/uL — ABNORMAL LOW (ref 3.87–5.11)
RDW: 13.9 % (ref 11.5–15.5)
WBC Count: 5.3 10*3/uL (ref 4.0–10.5)
nRBC: 0 % (ref 0.0–0.2)

## 2021-02-24 LAB — SAVE SMEAR(SSMR), FOR PROVIDER SLIDE REVIEW

## 2021-02-24 LAB — LACTATE DEHYDROGENASE: LDH: 182 U/L (ref 98–192)

## 2021-02-24 NOTE — Telephone Encounter (Signed)
Appts made and printed for pt per 02/24/21 l;os  Bonnie [name]

## 2021-02-24 NOTE — Progress Notes (Signed)
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  HPI: Bonnie [name] is a very charming 80 year old white female.  She is originally from [place].  She comes in with her husband.  He is a retired Engineer, structural up in [place], [place].  They have been down in [place] for about 30 years.  They came down in 1990.  She has multiple health issues.  She does have thalassemia.  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  She is of [place] heritage.  She also has rheumatoid arthritis.  She is on methotrexate.  She cannot remember how long she has been on methotrexate.  She apparently has had some issues with possible neuropathy.  She had a myeloma panel done back in early April.  There was a comment that there was a M spike detected which was an IgA lambda protein.  Apparently, there is no quantification of this protein.  Of course, once this was found, she was kindly referred to the [facility] for an evaluation.  There is no history of blood problems in the family.  She used to smoke but stopped many years ago.  She has an occasional wine.  There is no issues with weakness.  She may have some tingling in her fingers.  She has been fairly active.  She has had no problem with infections.  There is been no history of COVID exposure.  Overall, her performance status is ECOG 1.    Past Medical History:  Diagnosis Date   Beta thalassemia, heterozygous    ?alpha? pt does not recall.   CAD (coronary artery disease) 07/2018   NSTEMI (small vessel occlusion, not amenable to intervention)-->2V CAD on cath, preserved LV systolic fxn.   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  Surgical path NEG for atypica or malignancy.   Memory difficulties 12/25/2020   NSTEMI (non-ST elevated myocardial infarction) ([place]) 07/2018   Dr. Percival Spanish   Osteoporosis 01/2021   01/2021 T scoree -2.7   Palpitations    08/2018 Event monitor normal. Long term event monitor 05/2020 w/out signif arrhythmia--some brief SVT.   Rheumatoid arthritis ([place]) 2007   muscle weaknes at times (Dr. Amil Amen)   Tinnitus, bilateral 2021   with high frequ SSHL->reassured by Ent  :   Past Surgical History:  Procedure Laterality Date   ABDOMINAL HYSTERECTOMY  1987   DUB, no malignancy.  Ovaries still in.   BLADDER REPAIR     Cardiac Event Monitor  08/22/2018   NO arrhythmias.  Her symptomatic periods coresponded to NSR.   CHOLECYSTECTOMY  1969   COLONOSCOPY  11/07/2017   2014 adenoma.  Rpt 10/2017 adenoma x 1.  No further colonoscopies needed per GI.    DEXA  01/2021   T score -2.7   LEFT HEART CATH AND CORONARY ANGIOGRAPHY N/A 08/08/2018   Procedure: LEFT HEART CATH AND CORONARY ANGIOGRAPHY;  Surgeon: Troy Sine, MD;  Location: [facility];  Service: Cardiovascular;  Laterality: N/A;   THYROIDECTOMY  2012   cytologic atypia on nodule bx; surgical path NEG for atypica or malignancy.   TRANSTHORACIC ECHOCARDIOGRAM  08/07/2018   EF 60-65%, no wall motion abnl, grd II DD, mod MR.   UPPER GASTROINTESTINAL ENDOSCOPY     VAGINAL PROLAPSE REPAIR  2012  :   Current Outpatient Medications:    acetaminophen (TYLENOL) 500 MG tablet, Take 500 mg by mouth every 6 (six) hours as needed for headache (pain)., Disp: , Rfl:    alendronate (FOSAMAX) 70 MG tablet, Take 1 tablet (70 mg total) by mouth once a week. Take with a full glass of water on an empty stomach., Disp: 4 tablet, Rfl: 11   aspirin EC 81 MG tablet, Take 81 mg by mouth at bedtime., Disp: , Rfl:     Calcium Citrate-Vitamin D (CALCIUM CITRATE + D PO), Take 1 tablet by mouth 2 (two) times daily. , Disp: , Rfl:    celecoxib (CELEBREX) 200 MG capsule, Take by mouth daily., Disp: , Rfl:    chlorhexidine (PERIDEX) 0.12 % solution, , Disp: , Rfl:    Coenzyme Q10 (CO Q 10) 60 MG CAPS, 1 capsule with a meal, Disp: , Rfl:    diphenhydrAMINE (BENADRYL) 25 mg capsule, 1 capsule as needed, Disp: , Rfl:    fexofenadine (ALLEGRA) 180 MG tablet, 1 tablet as needed, Disp: , Rfl:    folic acid (FOLVITE) 1 MG tablet, Take 1 mg by mouth 2 (two) times daily., Disp: , Rfl:    HYDROcodone-acetaminophen (NORCO/VICODIN) 5-325 MG tablet, Take 1-2 tablets by mouth every 4 (four) hours as needed for moderate pain or severe pain., Disp: 30 tablet, Rfl: 0   isosorbide mononitrate (IMDUR) 60 MG 24 hr tablet, TAKE 1 TABLET BY MOUTH EVERY DAY, Disp: 90 tablet, Rfl: 0   LORazepam (ATIVAN) 0.5 MG tablet, 1-2 tabs po bid prn anxiety, Disp: 60 tablet, Rfl: 1   methotrexate 2.5 MG tablet, Take 12.5 mg by mouth every Sunday. , Disp: , Rfl:    Multiple Vitamin (MULTIVITAMIN WITH MINERALS) TABS tablet, Take 1 tablet by mouth daily., Disp: , Rfl:    Omega-3 Fatty Acids (FISH OIL PO), Take by mouth daily., Disp: , Rfl:    [place] Calcium 500 MG TABS, 1 tablet with meals, Disp: , Rfl:    potassium chloride (KLOR-CON) 8 MEQ tablet, Take 2 tablets (16 mEq total) by mouth daily., Disp: 180 tablet, Rfl: 3   rosuvastatin (CRESTOR) 40 MG tablet, Take 1 tablet (40 mg total) by mouth daily at 6 PM., Disp: 90 tablet, Rfl: 3   SYNTHROID 88 MCG tablet, TAKE 1 TABLET (88 MCG TOTAL) BY MOUTH DAILY BEFORE BREAKFAST., Disp: 90 tablet, Rfl: 1   donepezil (ARICEPT) 5 MG tablet, Take 1 tablet (5 mg total) by mouth at bedtime., Disp: 30 tablet, Rfl: 1   nitroGLYCERIN (NITROSTAT) 0.4 MG SL tablet, Place 1 tablet (0.4 mg total) under the tongue every 5 (five) minutes x 3 doses as needed for chest pain. (Patient not taking: Reported on 02/24/2021), Disp:  25 tablet, Rfl: 3:  :   Allergies  Allergen Reactions   Neosporin [Neomycin-Bacitracin Zn-Polymyx] Other (See Comments)    Patient states that wounds or scratches never heal. (pt uses bacitracin)    Other     Other reaction(s): Unknown   Seasonal Ic [Cholestatin] Other (See Comments)    Leg cramps   Triamcinolone Other (See Comments)    Burning sensation   Atorvastatin Other (See Comments)    Leg cramps  Monascus Purpureus Went Yeast Other (See Comments)    "Red Yeast Rice" causes leg cramps   Pravastatin Other (See Comments)    Leg cramps  :   Family History  Problem Relation Age of Onset   Hypertension Mother    CVA Mother    Colon cancer Neg Hx    Esophageal cancer Neg Hx    Stomach cancer Neg Hx    Rectal cancer Neg Hx   :   Social History   Socioeconomic History   Marital status: Married    Spouse name: Evette Doffing   Number of children: 5   Years of education: Not on file   Highest education level: Not on file  Occupational History   Not on file  Tobacco Use   Smoking status: Former    Packs/day: 0.50    Years: 10.00    Pack years: 5.00    Types: Cigarettes    Quit date: 11/19/1973    Years since quitting: 47.2   Smokeless tobacco: Never  Vaping Use   Vaping Use: Never used  Substance and Sexual Activity   Alcohol use: Yes    Alcohol/week: 0.0 standard drinks    Comment: very rare glass of wine   Drug use: No   Sexual activity: Yes  Other Topics Concern   Not on file  Social History Narrative   Lives with husband in [place].   Right Handed   Drinks 4-5 cups caffeine daily   Social Determinants of Health   Financial Resource Strain: Low Risk    Difficulty of Paying Living Expenses: Not hard at all  Food Insecurity: No Food Insecurity   Worried About Charity fundraiser in the Last Year: Never true   Arboriculturist in the Last Year: Never true  Transportation Needs: No Transportation Needs   Lack of Transportation (Medical): No   Lack of  Transportation (Non-Medical): No  Physical Activity: Not on file  Stress: Stress Concern Present   Feeling of Stress : To some extent  Social Connections: Engineer, building services of Communication with Friends and Family: More than three times a week   Frequency of Social Gatherings with Friends and Family: Once a week   Attends Religious Services: More than 4 times per year   Active Member of [employer] or Organizations: Yes   Attends Archivist Meetings: 1 to 4 times per year   Marital Status: Married  Human resources officer Violence: Not At Risk   Fear of Current or Ex-Partner: No   Emotionally Abused: No   Physically Abused: No   Sexually Abused: No  :  Review of Systems  Constitutional: Negative.  Negative for diaphoresis.  HENT: Negative.    Eyes: Negative.   Respiratory: Negative.    Cardiovascular: Negative.   Gastrointestinal: Negative.   Genitourinary: Negative.   Musculoskeletal: Negative.   Skin: Negative.   Neurological: Negative.   Endo/Heme/Allergies: Negative.   Psychiatric/Behavioral: Negative.      Exam: _0 @   This is a well-developed well-nourished white female in no obvious distress.  Vital signs are temperature 98.3.  Pulse 46.  Blood pressure 131/66.  Weight is 133 pounds.  Head neck exam shows no ocular or oral lesions.  She has no palpable cervical or supraclavicular lymph nodes.  Lungs are clear bilaterally.  Cardiac exam regular rate and rhythm with no murmurs, rubs or bruits.  Abdomen is soft.  She has good bowel sounds.  There is no fluid  wave.  There is no palpable liver or spleen tip.  Back exam shows no tenderness over the spine, ribs or hips.  Extremities shows no clubbing, cyanosis or edema.  She has good range of motion of her joints.  Neurological exam shows no focal neurological deficits.  Skin exam shows no rashes, ecchymoses or petechia.   Recent Labs    02/24/21 1056  WBC 5.3  HGB 11.7*  HCT 36.0  PLT 238    Recent  Labs    02/24/21 1056  NA 140  K 4.2  CL 108  CO2 25  GLUCOSE 99  BUN 16  CREATININE 0.70  CALCIUM 9.5    Blood smear review: None  Pathology: None    Assessment and Plan: Bonnie [name] is a very nice 80 year old white female.  She has multiple medical problems.  She was found to have a IgA lambda M spike.  Again this was not measured because it was not no enough to measure.  She did not have a elevated IgA level.  I have to believe that this is going to be reactionary to her chronic rheumatoid arthritis.  I really have to believe that we are not going to run into any issues with a plasma cell disorder.  I did give her a 24-hour urine to do.  I do not think we have to run a bone survey on her.  We certainly do not have to do a bone marrow biopsy on her.  I really doubt that she is going to have any problems with this IgA lambda MGUS.  There does not appear to be any organ issues right now.  She is not anemic.  There is no hypercalcemia.  Her renal function is fine.  I think we can just follow her along.  I would like to follow her up in 6 months.  I spent a good hour with she and her husband.  They are both very very nice.  It was wonderful talking with them .  I thanked him for serving his community as a Engineer, structural.

## 2021-02-25 LAB — PROTEIN ELECTROPHORESIS, SERUM, WITH REFLEX
A/G Ratio: 1.1 (ref 0.7–1.7)
Albumin ELP: 3.3 g/dL (ref 2.9–4.4)
Alpha-1-Globulin: 0.3 g/dL (ref 0.0–0.4)
Alpha-2-Globulin: 0.9 g/dL (ref 0.4–1.0)
Beta Globulin: 0.9 g/dL (ref 0.7–1.3)
Gamma Globulin: 0.8 g/dL (ref 0.4–1.8)
Globulin, Total: 2.9 g/dL (ref 2.2–3.9)
Total Protein ELP: 6.2 g/dL (ref 6.0–8.5)

## 2021-02-25 LAB — KAPPA/LAMBDA LIGHT CHAINS
Kappa free light chain: 20.8 mg/L — ABNORMAL HIGH (ref 3.3–19.4)
Kappa, lambda light chain ratio: 1.14 (ref 0.26–1.65)
Lambda free light chains: 18.3 mg/L (ref 5.7–26.3)

## 2021-02-25 LAB — IGG, IGA, IGM
IgA: 274 mg/dL (ref 64–422)
IgG (Immunoglobin G), Serum: 807 mg/dL (ref 586–1602)
IgM (Immunoglobulin M), Srm: 49 mg/dL (ref 26–217)

## 2021-02-25 LAB — BETA 2 MICROGLOBULIN, SERUM: Beta-2 Microglobulin: 2 mg/L (ref 0.6–2.4)

## 2021-03-04 ENCOUNTER — Other Ambulatory Visit: Payer: Self-pay

## 2021-03-04 ENCOUNTER — Inpatient Hospital Stay: Payer: Medicare Other

## 2021-03-04 DIAGNOSIS — D472 Monoclonal gammopathy: Secondary | ICD-10-CM | POA: Diagnosis not present

## 2021-03-06 DIAGNOSIS — M0589 Other rheumatoid arthritis with rheumatoid factor of multiple sites: Secondary | ICD-10-CM | POA: Diagnosis not present

## 2021-03-06 LAB — UPEP/UIFE/LIGHT CHAINS/TP, 24-HR UR
% BETA, Urine: 25.5 %
ALPHA 1 URINE: 4.6 %
Albumin, U: 41.8 %
Alpha 2, Urine: 15.6 %
Free Kappa Lt Chains,Ur: 97.19 mg/L — ABNORMAL HIGH (ref 1.17–86.46)
Free Kappa/Lambda Ratio: 3.7 (ref 1.83–14.26)
Free Lambda Lt Chains,Ur: 26.26 mg/L — ABNORMAL HIGH (ref 0.27–15.21)
GAMMA GLOBULIN URINE: 12.6 %
Total Protein, Urine-Ur/day: 204 mg/24 hr — ABNORMAL HIGH (ref 30–150)
Total Protein, Urine: 19.4 mg/dL
Total Volume: 1050

## 2021-03-08 ENCOUNTER — Other Ambulatory Visit: Payer: Self-pay | Admitting: Cardiology

## 2021-03-13 DIAGNOSIS — Z8616 Personal history of COVID-19: Secondary | ICD-10-CM

## 2021-03-13 HISTORY — DX: Personal history of COVID-19: Z86.16

## 2021-03-23 ENCOUNTER — Telehealth: Payer: Self-pay

## 2021-03-23 MED ORDER — NIRMATRELVIR/RITONAVIR (PAXLOVID)TABLET: 3.0000 | ORAL_TABLET | Freq: Two times a day (BID) | ORAL | 0 refills | Status: AC

## 2021-03-23 MED ORDER — NIRMATRELVIR/RITONAVIR (PAXLOVID)TABLET: 3.0000 | ORAL_TABLET | Freq: Two times a day (BID) | ORAL | 0 refills | Status: DC

## 2021-03-23 NOTE — Telephone Encounter (Signed)
Pt called stating that she wanted to have daughter placed on DPR. Advised pt and daughter to have forms signed to add her to her DPR list. Pt asked what she should do about being COVID +. I told pt that there was a note sent to PCP and we were waiting on response but in the meantime, treat symptoms as they come.

## 2021-03-23 NOTE — Telephone Encounter (Signed)
Pt was made aware medication sent to pharmacy, should not take Crestor while on this medication.

## 2021-03-23 NOTE — Telephone Encounter (Signed)
[name] - Client TELEPHONE ADVICE RECORD [facility] Patient Name: Bonnie [name] Gender: Female DOB: 1941/08/18 Age: 80 Y 9 M 21 D Return Phone Number: [phone] (Primary), [phone] (Secondary) Address: City/ State/ Zip: [place]  [address] Client [facility] - Client Client [facility] - Day Physician Crissie Sickles - MD Contact Type Call Who Is Calling Patient / Member / Family / Caregiver Call Type Triage / Clinical Relationship To Patient Self Return Phone Number [phone] (Primary) Chief Complaint Headache Reason for Call Symptomatic / Request for [name] states she is covid positive. She is requesting Paxlovid. Her symptoms are headache, runny nose, a fever of around 99.1. Bonnie [name] Not Listed UC Translation No Nurse Assessment Nurse: Jearld Pies, RN, Lovena Le Date/Time (Eastern Time): 03/22/2021 5:06:40 PM Confirm and document reason for call. If symptomatic, describe symptoms. ---Caller states she is covid positive as of yesterday. Her symptoms are headache, runny nose, congestion, sore throat, and a fever. Highest temp 101F temporal yesterday. Current temp 97.69F temporal. Last had tylenol this morning. Current pain level 2/10. Drinking less but urinating normally. Symptoms started 2 days ago. Denies chest pain, SOB, or any other symptoms at this time. Does the patient have any new or worsening symptoms? ---Yes Will a triage be completed? ---Yes Related visit to physician within the last 2 weeks? ---No Does the PT have any chronic conditions? (i.e. diabetes, asthma, this includes High risk factors for pregnancy, etc.) ---Yes List chronic conditions. ---Thyroid, HTN Is this a behavioral health or substance abuse call? ---No Guidelines Guideline Title Affirmed Question Affirmed Notes Nurse Date/Time Eilene Ghazi Time) COVID-19 - Diagnosed  or Suspected [1] HIGH RISK for severe COVID complications (e.g., weak immune Jearld Pies, RN, Lovena Le 03/22/2021 5:13:15 PM PLEASE NOTE: All timestamps contained within this report are represented as [place] Standard Time. CONFIDENTIALTY NOTICE: This fax transmission is intended only for the addressee. It contains information that is legally privileged, confidential or otherwise protected from use or disclosure. If you are not the intended recipient, you are strictly prohibited from reviewing, disclosing, copying using or disseminating any of this information or taking any action in reliance on or regarding this information. If you have received this fax in error, please notify us immediately by telephone so that we can arrange for its return to [place]. Phone: [phone], Toll-Free: [phone], Fax: [phone] Page: 2 of 2 Call Id: [id number] Guidelines Guideline Title Affirmed Question Affirmed Notes Nurse Date/Time Eilene Ghazi Time) system, age > 56 years, obesity with BMI > 25, pregnant, chronic lung disease or other chronic medical condition) AND [2] COVID symptoms (e.g., cough, fever) (Exceptions: Already seen by PCP and no new or worsening symptoms.) Disp. Time Eilene Ghazi Time) Disposition Final User 03/22/2021 5:19:20 PM See HCP within 4 Hours (or PCP triage) Yes Jearld Pies, RN, Lovena Le Disposition Overriden: Call PCP Now Override Reason: Patient's symptoms need a higher level of care Caller Disagree/Comply Comply Caller Understands Yes PreDisposition Did not know what to do Care Advice Given Per Guideline SEE HCP (OR PCP TRIAGE) WITHIN 4 HOURS: * You become worse CALL BACK IF: * ACETAMINOPHEN REGULAR STRENGTH TYLENOL: Take 650 mg (two 325 mg pills) by mouth every 4-6 hours as needed. Each Regular Strength Tylenol pill has 325 mg of acetaminophen. The most you should take each day is 3,250 mg (10 pills a day). * The goal of fever therapy is to bring the fever down to a comfortable  level.  Remember that fever medicine usually lowers fever 2 degrees F (1 - 1 1/2 degrees C). * They are over-the-counter (OTC) drugs that help treat both fever and pain. You can buy them at the drugstore. * For fevers above 101 F (38.3 C) take either acetaminophen or ibuprofen. FEVER MEDICINES: Referrals GO TO FACILITY OTHER - SPECIFY

## 2021-03-23 NOTE — Telephone Encounter (Signed)
Please advise if meets criteria for antiviral med?

## 2021-03-23 NOTE — Telephone Encounter (Signed)
Daughter called about mom having covid.  Daughter is not on DPR,  I told her we could not speak with her.  She said she was Executor on will, I told her that was not approved for HIPAA, so I was unable to speak with her. She is going to leave work and go to the home and have her Mom call in regarding phone not to after hours nurse.

## 2021-03-23 NOTE — Addendum Note (Signed)
Addended by: Deveron Furlong D on: 03/23/2021 03:31 PM   Modules accepted: Orders

## 2021-03-26 DIAGNOSIS — U071 COVID-19: Secondary | ICD-10-CM | POA: Diagnosis not present

## 2021-03-31 ENCOUNTER — Other Ambulatory Visit: Payer: Self-pay | Admitting: Cardiology

## 2021-04-07 ENCOUNTER — Telehealth: Payer: Self-pay | Admitting: Neurology

## 2021-04-07 NOTE — Telephone Encounter (Signed)
I called pt's daughter. No answer, left a message asking pt's daughter to call me back.

## 2021-04-07 NOTE — Telephone Encounter (Signed)
Pt's daughter [name] on [place] called stating she is needing to speak to the RN or provider regarding the pt's donepezil (ARICEPT) 5 MG tablet. Daughter states that  her sister insists that the pt has Alzheimer's or Dementia and she is needing to discuss this. Please call Danette at work [phone] first and just in case her cell phone is [phone]

## 2021-04-07 NOTE — Telephone Encounter (Signed)
Danette returned call. Please call back at the office number when available.

## 2021-04-07 NOTE — Telephone Encounter (Signed)
I returned pt's daughters call. She is requesting a sooner appt with Dr. Jannifer Franklin to discuss memory and medications. Wanted to talk about possible neuropsych eval? Pt placed on the schedule for 05/06/21 at 11 am with Dr. Jannifer Franklin.

## 2021-04-13 ENCOUNTER — Other Ambulatory Visit: Payer: Self-pay | Admitting: Cardiology

## 2021-05-03 NOTE — Progress Notes (Signed)
Cardiology Office Note   Date:  05/04/2021   ID:  Bonnie [name], DOB Jun 15, 1941, MRN [id number]  PCP:  Tammi Sou, MD  Cardiologist:   Minus Breeding, MD    Chief Complaint  Patient presents with   Coronary Artery Disease     History of Present Illness: Bonnie [name] is a 80 y.o. female who presents for evaluation of chest pain.  She was in the hospital in 08/06/2018 describing heavy pressure midsternal with associated nausea. EKG in ED was abnormal with inverted T-wave in Lead III.    She had cardiac cath on 08/08/2018 revealing 2 vessel disease with 60-65 % in the mLAD, and mRCA with an abrupt cut off in the mid posterior lateral vessel which was a small caliber vessel and suspected to be the culprit vessel with normal L Cx. She did not require intervention but was treated with DAPT with ASA and Brilinta, with aggressive lipid lowering recommended with increased dose of Crestor.  She was noted to have moderate MR and mild AI on echo with normal LV function .    She returns for follow up.   She has done well since I last saw her.The patient denies any new symptoms such as chest discomfort, neck or arm discomfort. There has been no new shortness of breath, PND or orthopnea. There have been no reported palpitations, presyncope or syncope.  Past Medical History:  Diagnosis Date   Beta thalassemia, heterozygous    ?alpha? pt does not recall.   CAD (coronary artery disease) 07/2018   NSTEMI (small vessel occlusion, not amenable to intervention)-->2V CAD on cath, preserved LV systolic fxn.   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  Surgical path NEG  for atypica or malignancy.   Memory difficulties 12/25/2020   NSTEMI (non-ST elevated myocardial infarction) ([facility]) 07/2018   Dr. Percival Spanish   Osteoporosis 01/2021   01/2021 T scoree -2.7   Palpitations    08/2018 Event monitor normal. Long term event monitor 05/2020 w/out signif arrhythmia--some brief SVT.   Rheumatoid arthritis ([facility]) 2007   muscle weaknes at times (Dr. Amil Amen)   Tinnitus, bilateral 2021   with high frequ SSHL->reassured by Ent    Past Surgical History:  Procedure Laterality Date   ABDOMINAL HYSTERECTOMY  1987   DUB, no malignancy.  Ovaries still in.   BLADDER REPAIR     Cardiac Event Monitor  08/22/2018   NO arrhythmias.  Her symptomatic periods coresponded to NSR.   CHOLECYSTECTOMY  1969   COLONOSCOPY  11/07/2017   2014 adenoma.  Rpt 10/2017 adenoma x 1.  No further colonoscopies needed per GI.    DEXA  01/2021   T score -2.7   LEFT HEART CATH AND CORONARY ANGIOGRAPHY N/A 08/08/2018   Procedure: LEFT HEART CATH AND CORONARY ANGIOGRAPHY;  Surgeon: Troy Sine, MD;  Location: [facility];  Service: Cardiovascular;  Laterality: N/A;   THYROIDECTOMY  2012   cytologic atypia on nodule bx; surgical path NEG for atypica or malignancy.   TRANSTHORACIC ECHOCARDIOGRAM  08/07/2018   EF 60-65%, no wall motion abnl, grd II DD, mod MR.   UPPER GASTROINTESTINAL ENDOSCOPY  VAGINAL PROLAPSE REPAIR  2012     Current Outpatient Medications  Medication Sig Dispense Refill   acetaminophen (TYLENOL) 500 MG tablet Take 500 mg by mouth every 6 (six) hours as needed for headache (pain).     alendronate (FOSAMAX) 70 MG tablet Take 1 tablet (70 mg total) by mouth once a week. Take with a full glass of water on an empty stomach. 4 tablet 11   aspirin EC 81 MG tablet Take 81 mg by mouth at bedtime.     Calcium Citrate-Vitamin D (CALCIUM CITRATE + D PO) Take 1 tablet by mouth 2 (two) times daily.      celecoxib (CELEBREX) 200 MG capsule Take by mouth daily.     chlorhexidine  (PERIDEX) 0.12 % solution      Coenzyme Q10 (CO Q 10) 60 MG CAPS 1 capsule with a meal     diphenhydrAMINE (BENADRYL) 25 mg capsule 1 capsule as needed     fexofenadine (ALLEGRA) 180 MG tablet 1 tablet as needed     folic acid (FOLVITE) 1 MG tablet Take 1 mg by mouth 2 (two) times daily.     HYDROcodone-acetaminophen (NORCO/VICODIN) 5-325 MG tablet Take 1-2 tablets by mouth every 4 (four) hours as needed for moderate pain or severe pain. 30 tablet 0   isosorbide mononitrate (IMDUR) 60 MG 24 hr tablet TAKE 1 TABLET BY MOUTH EVERY DAY 90 tablet 0   LORazepam (ATIVAN) 0.5 MG tablet 1-2 tabs po bid prn anxiety 60 tablet 1   methotrexate 2.5 MG tablet Take 12.5 mg by mouth every Sunday.      Multiple Vitamin (MULTIVITAMIN WITH MINERALS) TABS tablet Take 1 tablet by mouth daily.     nitroGLYCERIN (NITROSTAT) 0.4 MG SL tablet Place 1 tablet (0.4 mg total) under the tongue every 5 (five) minutes x 3 doses as needed for chest pain. 25 tablet 3   Omega-3 Fatty Acids (FISH OIL PO) Take by mouth daily.     potassium chloride (KLOR-CON) 8 MEQ tablet TAKE 2 TABLETS (16 MEQ TOTAL) BY MOUTH DAILY. 180 tablet 3   rosuvastatin (CRESTOR) 40 MG tablet TAKE 1 TABLET (40 MG TOTAL) BY MOUTH DAILY AT 6 PM. 90 tablet 3   SYNTHROID 88 MCG tablet TAKE 1 TABLET (88 MCG TOTAL) BY MOUTH DAILY BEFORE BREAKFAST. 90 tablet 1   Oyster Shell Calcium 500 MG TABS 1 tablet with meals (Patient not taking: Reported on 05/04/2021)     No current facility-administered medications for this visit.    Allergies:   Neosporin [neomycin-bacitracin zn-polymyx], Other, Seasonal ic [cholestatin], Triamcinolone, Atorvastatin, Monascus purpureus went yeast, and Pravastatin    ROS:  Please see the history of present illness.   Otherwise, review of systems are positive for none.   All other systems are reviewed and negative.    PHYSICAL EXAM: VS:  BP 135/66   Pulse (!) 52   Ht '5\' 7"'$  (1.702 m)   Wt 134 lb 9.6 oz (61.1 kg)   SpO2 99%   BMI  21.08 kg/m  , BMI Body mass index is 21.08 kg/m. GENERAL:  Well appearing NECK:  No jugular venous distention, waveform within normal limits, carotid upstroke brisk and symmetric, no bruits, no thyromegaly LUNGS:  Clear to auscultation bilaterally CHEST:  Unremarkable HEART:  PMI not displaced or sustained,S1 and S2 within normal limits, no S3, no S4, no clicks, no rubs, no murmurs ABD:  Flat, positive bowel sounds normal in frequency in pitch, no bruits, no rebound, no  guarding, no midline pulsatile mass, no hepatomegaly, no splenomegaly EXT:  2 plus pulses throughout, no edema, no cyanosis no clubbing   EKG:  EKG is  ordered today. Sinus rhythm, rate 52, axis within normal limits, intervals within normal limits, nonspecific diffuse T wave flattening.  Recent Labs: 02/03/2021: TSH 0.64 02/24/2021: ALT 10; BUN 16; Creatinine 0.70; Hemoglobin 11.7; Platelet Count 238; Potassium 4.2; Sodium 140    Lipid Panel    Component Value Date/Time   CHOL 137 03/24/2020 0925   TRIG 48.0 03/24/2020 0925   HDL 68.20 03/24/2020 0925   CHOLHDL 2 03/24/2020 0925   VLDL 9.6 03/24/2020 0925   LDLCALC 59 03/24/2020 0925   LDLCALC 52 10/12/2019 1357   LDLDIRECT 155.5 12/04/2012 1123     Lab Results  Component Value Date   TSH 0.64 02/03/2021     Wt Readings from Last 3 Encounters:  05/04/21 134 lb 9.6 oz (61.1 kg)  02/24/21 133 lb (60.3 kg)  02/03/21 135 lb (61.2 kg)      Other studies Reviewed: Additional studies/ records that were reviewed today include: None Review of the above records demonstrates:    None   ASSESSMENT AND PLAN:  CAD:   The patient has no new sypmtoms.  No further cardiovascular testing is indicated.  We will continue with aggressive risk reduction and meds as listed.  MR:  This was moderate on echo in 2021.  I will follow-up an echo next year.   AI:  This was mild.   As above  PALPITATIONS:    She had a brief run of SVT.  She still occasionally has these but  they are not more problematic than they were when she wore the monitor.  No change in therapy.  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  DYSLIPDEMIA: Her LDL was excellent in 2021 and I have instructed her to go fasting to her November appointment to get a follow-up fasting lipid profile.     Current medicines are reviewed at length with the patient today.  The patient does not have concerns regarding medicines.  The following changes have been made:  None  Labs/ tests ordered today include:     Orders Placed This Encounter  Procedures   EKG 12-Lead   ECHOCARDIOGRAM COMPLETE      Disposition:   FU with me in one year.    Signed, Minus Breeding, MD  05/04/2021 1:44 PM    [facility]

## 2021-05-04 ENCOUNTER — Other Ambulatory Visit: Payer: Self-pay

## 2021-05-04 ENCOUNTER — Ambulatory Visit (INDEPENDENT_AMBULATORY_CARE_PROVIDER_SITE_OTHER): Payer: Medicare Other | Admitting: Cardiology

## 2021-05-04 ENCOUNTER — Encounter: Payer: Self-pay | Admitting: Cardiology

## 2021-05-04 VITALS — BP 135/66 | HR 52 | Ht 67.0 in | Wt 134.6 lb

## 2021-05-04 DIAGNOSIS — R002 Palpitations: Secondary | ICD-10-CM | POA: Diagnosis not present

## 2021-05-04 DIAGNOSIS — I340 Nonrheumatic mitral (valve) insufficiency: Secondary | ICD-10-CM | POA: Diagnosis not present

## 2021-05-04 DIAGNOSIS — I2510 Atherosclerotic heart disease of native coronary artery without angina pectoris: Secondary | ICD-10-CM | POA: Diagnosis not present

## 2021-05-04 NOTE — Patient Instructions (Signed)
Medication Instructions:  Your physician recommends that you continue on your current medications as directed. Please refer to the Current Medication list given to you today.  *If you need a refill on your cardiac medications before your next appointment, please call your pharmacy*  Testing/Procedures: Your physician has requested that you have an echocardiogram. Echocardiography is a painless test that uses sound waves to create images of your heart. It provides your doctor with information about the size and shape of your heart and how well your heart's chambers and valves are working. This procedure takes approximately one hour. There are no restrictions for this procedure. -- due August 2023   Follow-Up: At [facility], you and your health needs are our priority.  As part of our continuing mission to provide you with exceptional heart care, we have created designated Provider Care Teams.  These Care Teams include your primary Cardiologist (physician) and Advanced Practice Providers (APPs -  Physician Assistants and Nurse Practitioners) who all work together to provide you with the care you need, when you need it.  We recommend signing up for the patient portal called "MyChart".  Sign up information is provided on this After Visit Summary.  MyChart is used to connect with patients for Virtual Visits (Telemedicine).  Patients are able to view lab/test results, encounter notes, upcoming appointments, etc.  Non-urgent messages can be sent to your provider as well.   To learn more about what you can do with MyChart, go to [email or web address].    Your next appointment:   12 month(s)  The format for your next appointment:   In Person  Provider:   You may see Minus Breeding, MD or one of the following Advanced Practice Providers on your designated Care Team:   Rosaria Ferries, PA-C Caron Presume, PA-C Jory Sims, DNP, ANP   Other Instructions

## 2021-05-06 ENCOUNTER — Encounter: Payer: Self-pay | Admitting: Neurology

## 2021-05-06 ENCOUNTER — Other Ambulatory Visit: Payer: Self-pay

## 2021-05-06 ENCOUNTER — Ambulatory Visit (INDEPENDENT_AMBULATORY_CARE_PROVIDER_SITE_OTHER): Payer: Medicare Other | Admitting: Neurology

## 2021-05-06 VITALS — BP 129/67 | HR 60 | Ht 67.0 in | Wt 133.0 lb

## 2021-05-06 DIAGNOSIS — G3184 Mild cognitive impairment, so stated: Secondary | ICD-10-CM | POA: Diagnosis not present

## 2021-05-06 DIAGNOSIS — I2510 Atherosclerotic heart disease of native coronary artery without angina pectoris: Secondary | ICD-10-CM | POA: Diagnosis not present

## 2021-05-06 NOTE — Progress Notes (Signed)
Reason for visit: Memory disorder  Bonnie [name] is an 80 y.o. female  History of present illness:  Bonnie [name] is a 80 year old right-handed white female with a history of a progressive memory disorder.  The patient has not given up any activities daily because of memory.  She comes in with her family today who have concerns about the underlying diagnosis.  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  The patient was given a trial on donepezil, but she cannot tolerate the drug as it resulted in vivid dreams and some sensation of gait instability.  She also indicates that she has had a 10-year history of decreased sense of smell and she struggles to maintain her weight, she never feels like eating.  The patient has a history of rheumatoid arthritis as well.  She is on methotrexate.  She comes to the office today for an evaluation.  Past Medical History:  Diagnosis Date   Beta thalassemia, heterozygous    ?alpha? pt does not recall.   CAD (coronary artery disease) 07/2018   NSTEMI (small vessel occlusion, not amenable to intervention)-->2V CAD on cath, preserved LV systolic fxn.   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  Surgical path NEG for atypica or malignancy.   Memory difficulties 12/25/2020   NSTEMI (non-ST elevated myocardial infarction) ([place]) 07/2018   Dr. Percival Spanish   Osteoporosis 01/2021   01/2021 T scoree -2.7   Palpitations    08/2018 Event monitor normal. Long term event monitor 05/2020 w/out signif arrhythmia--some brief SVT.   Rheumatoid arthritis ([place]) 2007   muscle weaknes at times (Dr.  Amil Amen)   Tinnitus, bilateral 2021   with high frequ SSHL->reassured by Ent    Past Surgical History:  Procedure Laterality Date   ABDOMINAL HYSTERECTOMY  1987   DUB, no malignancy.  Ovaries still in.   BLADDER REPAIR     Cardiac Event Monitor  08/22/2018   NO arrhythmias.  Her symptomatic periods coresponded to NSR.   CHOLECYSTECTOMY  1969   COLONOSCOPY  11/07/2017   2014 adenoma.  Rpt 10/2017 adenoma x 1.  No further colonoscopies needed per GI.    DEXA  01/2021   T score -2.7   LEFT HEART CATH AND CORONARY ANGIOGRAPHY N/A 08/08/2018   Procedure: LEFT HEART CATH AND CORONARY ANGIOGRAPHY;  Surgeon: Troy Sine, MD;  Location: [facility];  Service: Cardiovascular;  Laterality: N/A;   THYROIDECTOMY  2012   cytologic atypia on nodule bx; surgical path NEG for atypica or malignancy.   TRANSTHORACIC ECHOCARDIOGRAM  08/07/2018   EF 60-65%, no wall motion abnl, grd II DD, mod MR.   UPPER GASTROINTESTINAL ENDOSCOPY     VAGINAL PROLAPSE REPAIR  2012    Family History  Problem Relation Age of Onset   Hypertension Mother    CVA Mother    Colon cancer Neg Hx    Esophageal cancer Neg Hx    Stomach cancer Neg Hx    Rectal cancer Neg Hx     Social history:  reports that she quit smoking about 47 years ago. Her smoking use included cigarettes.  She has a 5.00 pack-year smoking history. She has never used smokeless tobacco. She reports current alcohol use. She reports that she does not use drugs.    Allergies  Allergen Reactions   Neosporin [Neomycin-Bacitracin Zn-Polymyx] Other (See Comments)    Patient states that wounds or scratches never heal. (pt uses bacitracin)    Other     Other reaction(s): Unknown   Seasonal Ic [Cholestatin] Other (See Comments)    Leg cramps   Triamcinolone Other (See Comments)    Burning sensation   Atorvastatin Other (See Comments)    Leg cramps   Monascus Purpureus Went Yeast Other (See Comments)    "Red Yeast Rice" causes leg cramps    Pravastatin Other (See Comments)    Leg cramps    Medications:  Prior to Admission medications   Medication Sig Start Date End Date Taking? Authorizing Provider  acetaminophen (TYLENOL) 500 MG tablet Take 500 mg by mouth every 6 (six) hours as needed for headache (pain).    [provider]  alendronate (FOSAMAX) 70 MG tablet Take 1 tablet (70 mg total) by mouth once a week. Take with a full glass of water on an empty stomach. 01/15/21   McGowenAdrian Blackwater, MD  aspirin EC 81 MG tablet Take 81 mg by mouth at bedtime.    [provider]  Calcium Citrate-Vitamin D (CALCIUM CITRATE + D PO) Take 1 tablet by mouth 2 (two) times daily.     [provider]  celecoxib (CELEBREX) 200 MG capsule Take by mouth daily. 10/12/18   [provider]  chlorhexidine (PERIDEX) 0.12 % solution  10/11/18   [provider]  Coenzyme Q10 (CO Q 10) 60 MG CAPS 1 capsule with a meal    [provider]  diphenhydrAMINE (BENADRYL) 25 mg capsule 1 capsule as needed    [provider]  fexofenadine (ALLEGRA) 180 MG tablet 1 tablet as needed    [provider]  folic acid (FOLVITE) 1 MG tablet Take 1 mg by mouth 2 (two) times daily.    [provider]  HYDROcodone-acetaminophen (NORCO/VICODIN) 5-325 MG tablet Take 1-2 tablets by mouth every 4 (four) hours as needed for moderate pain or severe pain. 09/14/18   McGowen, Adrian Blackwater, MD  isosorbide mononitrate (IMDUR) 60 MG 24 hr tablet TAKE 1 TABLET BY MOUTH EVERY DAY 04/14/21   Minus Breeding, MD  LORazepam (ATIVAN) 0.5 MG tablet 1-2 tabs po bid prn anxiety 02/03/21   McGowen, Adrian Blackwater, MD  methotrexate 2.5 MG tablet Take 12.5 mg by mouth every Sunday.     [provider]  Multiple Vitamin (MULTIVITAMIN WITH MINERALS) TABS tablet Take 1 tablet by mouth daily.    [provider]  nitroGLYCERIN (NITROSTAT) 0.4 MG SL tablet Place 1 tablet (0.4 mg total) under the tongue every 5 (five) minutes x 3  doses as needed for chest pain. 08/08/18   Kroeger, Lorelee Cover., PA-C  Omega-3 Fatty Acids (FISH OIL PO) Take by mouth daily.    [provider]  [place] Calcium 500 MG TABS 1 tablet with meals Patient not taking: Reported on 05/04/2021    [provider]  potassium chloride (KLOR-CON) 8 MEQ tablet TAKE 2 TABLETS (16 MEQ TOTAL) BY MOUTH DAILY. 03/10/21   Minus Breeding, MD  rosuvastatin (CRESTOR) 40 MG tablet TAKE 1 TABLET (40 MG TOTAL) BY MOUTH DAILY AT 6 PM. 04/14/21   Minus Breeding, MD  SYNTHROID 88 MCG tablet TAKE 1 TABLET (88  MCG TOTAL) BY MOUTH DAILY BEFORE BREAKFAST. 02/04/21   McGowen, Adrian Blackwater, MD    ROS:  Out of a complete 14 system review of symptoms, the patient complains only of the following symptoms, and all other reviewed systems are negative.  Memory problems Joint pains  Height '5\' 7"'$  (1.702 m), weight 133 lb (60.3 kg).  Physical Exam  General: The patient is alert and cooperative at the time of the examination.  Skin: 1+ edema at the ankles was noted.   Neurologic Exam  Mental status: The patient is alert and oriented x 3 at the time of the examination. The Mini-Mental status examination done today shows a total score of 24/30.   Cranial nerves: Facial symmetry is present. Speech is normal, no aphasia or dysarthria is noted. Extraocular movements are full. Visual fields are full.  Motor: The patient has good strength in all 4 extremities.  Sensory examination: Soft touch sensation is symmetric on the face, arms, and legs.  Coordination: The patient has good finger-nose-finger and heel-to-shin bilaterally.  Gait and station: The patient has a normal gait. Tandem gait is normal. Romberg is negative. No drift is seen.  Reflexes: Deep tendon reflexes are symmetric.   MRI brain 12/30/20:   IMPRESSION: This MRI of the brain without contrast shows the following: 1.   Mild to moderate generalized cortical atrophy most pronounced in the medial  temporal lobes.  The atrophy has occurred since the 04/04/2012 MRI when brain volume was normal for age. 2.   Some scattered T2/FLAIR hyperintense foci in the hemispheres consistent with mild chronic microvascular ischemic change, just minimally progressed compared to the 2013 MRI. 3.   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  Both of these have occurred since 2013 MRI.  Such a small number of chronic microhemorrhages is unlikely to be clinically significant. 4.   No acute findings.  * MRI scan images were reviewed online. I agree with the written report.    Assessment/Plan:  1.  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  The family wishes to have formal neuropsychological testing to help confirm the diagnosis of Alzheimer's disease.  The patient herself is not sure that she wants to have the testing done.  I have recommended trying Namenda, the patient will make decision regarding this medication in the future, she currently is not sure she wants to start this drug now.  She we will follow-up here in 6 months.  In the future, she can be followed through Dr. Brett Fairy.  Greater than 50% of the visit was spent in counseling and coordination of care.  Face-to-face time with the patient was 30 minutes.   Jill Alexanders MD 05/06/2021 10:46 AM  [facility] [address] [place], [place] [address]  Phone [phone] Fax [phone]

## 2021-05-30 ENCOUNTER — Encounter: Payer: Self-pay | Admitting: Family Medicine

## 2021-06-05 DIAGNOSIS — Z6821 Body mass index (BMI) 21.0-21.9, adult: Secondary | ICD-10-CM | POA: Diagnosis not present

## 2021-06-05 DIAGNOSIS — Z79899 Other long term (current) drug therapy: Secondary | ICD-10-CM | POA: Diagnosis not present

## 2021-06-05 DIAGNOSIS — M0589 Other rheumatoid arthritis with rheumatoid factor of multiple sites: Secondary | ICD-10-CM | POA: Diagnosis not present

## 2021-06-05 DIAGNOSIS — M791 Myalgia, unspecified site: Secondary | ICD-10-CM | POA: Diagnosis not present

## 2021-06-09 ENCOUNTER — Ambulatory Visit (INDEPENDENT_AMBULATORY_CARE_PROVIDER_SITE_OTHER): Payer: Medicare Other

## 2021-06-09 ENCOUNTER — Encounter: Payer: Self-pay | Admitting: Family Medicine

## 2021-06-09 ENCOUNTER — Other Ambulatory Visit: Payer: Self-pay

## 2021-06-09 DIAGNOSIS — Z23 Encounter for immunization: Secondary | ICD-10-CM

## 2021-07-07 ENCOUNTER — Telehealth: Payer: Self-pay | Admitting: Cardiology

## 2021-07-07 MED ORDER — NITROGLYCERIN 0.4 MG SL SUBL: 0.4000 mg | SUBLINGUAL_TABLET | SUBLINGUAL | 3 refills | Status: DC | PRN

## 2021-07-07 NOTE — Telephone Encounter (Signed)
*  STAT* If patient is at the pharmacy, call can be transferred to refill team.   1. Which medications need to be refilled? (please list name of each medication and dose if known) nitroGLYCERIN (NITROSTAT) 0.4 MG SL tablet  2. Which pharmacy/location (including street and city if local pharmacy) is medication to be sent to? [id number] - [place], [place] - [address]  3. Do they need a 30 day or 90 day supply? 30 ds

## 2021-07-09 ENCOUNTER — Encounter: Payer: Self-pay | Admitting: Neurology

## 2021-07-09 ENCOUNTER — Other Ambulatory Visit: Payer: Self-pay

## 2021-07-09 ENCOUNTER — Ambulatory Visit (INDEPENDENT_AMBULATORY_CARE_PROVIDER_SITE_OTHER): Payer: Medicare Other | Admitting: Neurology

## 2021-07-09 VITALS — BP 122/82 | HR 57 | Ht 67.0 in | Wt 133.4 lb

## 2021-07-09 DIAGNOSIS — I2510 Atherosclerotic heart disease of native coronary artery without angina pectoris: Secondary | ICD-10-CM | POA: Diagnosis not present

## 2021-07-09 DIAGNOSIS — G3184 Mild cognitive impairment, so stated: Secondary | ICD-10-CM

## 2021-07-09 NOTE — Progress Notes (Signed)
Reason for visit: Mild cognitive impairment  Bonnie [name] is an 80 y.o. female  History of present illness:  Bonnie [name] is a 80 year old right-handed white female with a history of a progressive memory disturbance over the last year.  The patient has an IgA monoclonal antibody as well.  She returns with her husband and daughter today.  They are concerned about her progressive memory issues.  She has primarily short-term memory issues.  The patient does not tolerate Aricept, she opted not to go on another medication such as Namenda.  The family has desired her to have formal neuropsychological testing but the patient herself is not sure that she wants to undergo the testing.  MRI of the brain did show cortical atrophy with mesial temporal atrophy suggestive of an Alzheimer's process.  The patient has not given up any activities of daily living because of memory so far.  She is still operating a motor vehicle.  Past Medical History:  Diagnosis Date   Beta thalassemia, heterozygous    ?alpha? pt does not recall.   CAD (coronary artery disease) 07/2018   NSTEMI (small vessel occlusion, not amenable to intervention)-->2V CAD on cath, preserved LV systolic fxn.   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  Surgical path NEG for atypica or malignancy.   Memory difficulties 12/25/2020   NSTEMI (non-ST elevated myocardial infarction) ([place]) 07/2018   Dr. Percival Spanish   Osteoporosis 01/2021   01/2021 T scoree -2.7   Palpitations    08/2018 Event monitor normal. Long term event monitor 05/2020 w/out signif arrhythmia--some brief SVT.   Rheumatoid arthritis ([place]) 2007   muscle  weaknes at times (Dr. Amil Amen)   Tinnitus, bilateral 2021   with high frequ SSHL->reassured by Ent    Past Surgical History:  Procedure Laterality Date   ABDOMINAL HYSTERECTOMY  1987   DUB, no malignancy.  Ovaries still in.   BLADDER REPAIR     Cardiac Event Monitor  08/22/2018   NO arrhythmias.  Her symptomatic periods coresponded to NSR.   CHOLECYSTECTOMY  1969   COLONOSCOPY  11/07/2017   2014 adenoma.  Rpt 10/2017 adenoma x 1.  No further colonoscopies needed per GI.    DEXA  01/2021   T score -2.7   LEFT HEART CATH AND CORONARY ANGIOGRAPHY N/A 08/08/2018   Procedure: LEFT HEART CATH AND CORONARY ANGIOGRAPHY;  Surgeon: Troy Sine, MD;  Location: [facility];  Service: Cardiovascular;  Laterality: N/A;   THYROIDECTOMY  2012   cytologic atypia on nodule bx; surgical path NEG for atypica or malignancy.   TRANSTHORACIC ECHOCARDIOGRAM  08/07/2018   EF 60-65%, no wall motion abnl, grd II DD, mod MR.   UPPER GASTROINTESTINAL ENDOSCOPY     VAGINAL PROLAPSE REPAIR  2012    Family History  Problem Relation Age of Onset   Hypertension Mother    CVA Mother    Colon cancer Neg Hx    Esophageal cancer Neg Hx    Stomach cancer Neg Hx    Rectal cancer Neg Hx     Social history:  reports that she quit smoking about 47 years ago. Her smoking use included  cigarettes. She has a 5.00 pack-year smoking history. She has never used smokeless tobacco. She reports current alcohol use. She reports that she does not use drugs.    Allergies  Allergen Reactions   Aricept [Donepezil]     Vivid dreams   Neosporin [Neomycin-Bacitracin Zn-Polymyx] Other (See Comments)    Patient states that wounds or scratches never heal. (pt uses bacitracin)    Other     Other reaction(s): Unknown   Seasonal Ic [Cholestatin] Other (See Comments)    Leg cramps   Triamcinolone Other (See Comments)    Burning sensation   Atorvastatin Other (See Comments)    Leg cramps   Monascus Purpureus Went Yeast  Other (See Comments)    "Red Yeast Rice" causes leg cramps   Pravastatin Other (See Comments)    Leg cramps    Medications:  Prior to Admission medications   Medication Sig Start Date End Date Taking? Authorizing Provider  acetaminophen (TYLENOL) 500 MG tablet Take 500 mg by mouth every 6 (six) hours as needed for headache (pain).   Yes [provider]  alendronate (FOSAMAX) 70 MG tablet Take 1 tablet (70 mg total) by mouth once a week. Take with a full glass of water on an empty stomach. 01/15/21  Yes McGowen, Adrian Blackwater, MD  aspirin EC 81 MG tablet Take 81 mg by mouth at bedtime.   Yes [provider]  Calcium Citrate-Vitamin D (CALCIUM CITRATE + D PO) Take 1 tablet by mouth 2 (two) times daily.    Yes [provider]  celecoxib (CELEBREX) 200 MG capsule Take by mouth daily. 10/12/18  Yes [provider]  Coenzyme Q10 (CO Q 10) 60 MG CAPS 1 capsule with a meal   Yes [provider]  diphenhydrAMINE (BENADRYL) 25 mg capsule 1 capsule as needed   Yes [provider]  fexofenadine (ALLEGRA) 180 MG tablet 1 tablet as needed   Yes [provider]  folic acid (FOLVITE) 1 MG tablet Take 1 mg by mouth 2 (two) times daily.   Yes [provider]  isosorbide mononitrate (IMDUR) 60 MG 24 hr tablet TAKE 1 TABLET BY MOUTH EVERY DAY 04/14/21  Yes Minus Breeding, MD  LORazepam (ATIVAN) 0.5 MG tablet 1-2 tabs po bid prn anxiety 02/03/21  Yes McGowen, Adrian Blackwater, MD  methotrexate 2.5 MG tablet Take 12.5 mg by mouth every Sunday.    Yes [provider]  Multiple Vitamin (MULTIVITAMIN WITH MINERALS) TABS tablet Take 1 tablet by mouth daily.   Yes [provider]  nitroGLYCERIN (NITROSTAT) 0.4 MG SL tablet Place 1 tablet (0.4 mg total) under the tongue every 5 (five) minutes x 3 doses as needed for chest pain. 07/07/21  Yes Minus Breeding, MD  Omega-3 Fatty Acids (FISH OIL PO) Take by mouth daily.   Yes [provider]   potassium chloride (KLOR-CON) 8 MEQ tablet TAKE 2 TABLETS (16 MEQ TOTAL) BY MOUTH DAILY. 03/10/21  Yes Minus Breeding, MD  rosuvastatin (CRESTOR) 40 MG tablet TAKE 1 TABLET (40 MG TOTAL) BY MOUTH DAILY AT 6 PM. 04/14/21  Yes Minus Breeding, MD  [place] PO Take by mouth.   Yes [provider]  SYNTHROID 88 MCG tablet TAKE 1 TABLET (88 MCG TOTAL) BY MOUTH DAILY BEFORE BREAKFAST. 02/04/21  Yes McGowen, Adrian Blackwater, MD    ROS:  Out of a complete 14 system review of symptoms, the patient complains only of the following symptoms, and all other reviewed systems are  negative.  Memory problems Decreased sense of smell, reduced appetite  Blood pressure 122/82, pulse (!) 57, height 5\' 7"  (1.702 m), weight 133 lb 6 oz (60.5 kg), SpO2 99 %.  Physical Exam  General: The patient is alert and cooperative at the time of the examination.  Skin: 1-2+ ankle edema is seen bilaterally.   Neurologic Exam  Mental status: The patient is alert and oriented x 3 at the time of the examination. The patient has apparent normal recent and remote memory, with an apparently normal attention span and concentration ability.   Cranial nerves: Facial symmetry is present. Speech is normal, no aphasia or dysarthria is noted. Extraocular movements are full. Visual fields are full.  Motor: The patient has good strength in all 4 extremities.  Sensory examination: Soft touch sensation is symmetric on the face, arms, and legs.  Coordination: The patient has good finger-nose-finger and heel-to-shin bilaterally.  Gait and station: The patient has a normal gait. Tandem gait is slightly unsteady. Romberg is negative. No drift is seen.  Reflexes: Deep tendon reflexes are symmetric.   Assessment/Plan:  1.  Mild cognitive impairment  The patient does have a developing short-term memory issue that likely represents an Alzheimer's process.  I have offered the use of Namenda, a possible speech therapy referral for  cognitive training.  If the patient wishes, we can do neuropsychological evaluation.  The patient will contact the office if she decides to initiate any medication or a testing procedure.  She will follow-up in 6 months with Dr. Brett Fairy.  Jill Alexanders MD 07/09/2021 8:21 AM  [facility] [address] [place], [place] [address]  Phone [phone] Fax [phone]

## 2021-07-11 ENCOUNTER — Other Ambulatory Visit: Payer: Self-pay | Admitting: Family Medicine

## 2021-08-03 ENCOUNTER — Telehealth: Payer: Self-pay | Admitting: Family Medicine

## 2021-08-03 NOTE — Telephone Encounter (Signed)
Left message for patient to schedule Annual Wellness Visit.  Please schedule with Nurse Health Advisor Charlott Rakes, RN at [facility]. Please call [phone] ask for [facility]

## 2021-08-12 ENCOUNTER — Encounter: Payer: Self-pay | Admitting: Family Medicine

## 2021-08-12 ENCOUNTER — Ambulatory Visit (INDEPENDENT_AMBULATORY_CARE_PROVIDER_SITE_OTHER): Payer: Medicare Other | Admitting: Family Medicine

## 2021-08-12 ENCOUNTER — Other Ambulatory Visit: Payer: Self-pay

## 2021-08-12 VITALS — BP 111/68 | HR 47 | Temp 97.7°F | Ht 67.0 in | Wt 134.2 lb

## 2021-08-12 DIAGNOSIS — Z1231 Encounter for screening mammogram for malignant neoplasm of breast: Secondary | ICD-10-CM | POA: Diagnosis not present

## 2021-08-12 DIAGNOSIS — I2510 Atherosclerotic heart disease of native coronary artery without angina pectoris: Secondary | ICD-10-CM

## 2021-08-12 DIAGNOSIS — E039 Hypothyroidism, unspecified: Secondary | ICD-10-CM | POA: Diagnosis not present

## 2021-08-12 DIAGNOSIS — Z23 Encounter for immunization: Secondary | ICD-10-CM | POA: Diagnosis not present

## 2021-08-12 DIAGNOSIS — F411 Generalized anxiety disorder: Secondary | ICD-10-CM | POA: Diagnosis not present

## 2021-08-12 DIAGNOSIS — M810 Age-related osteoporosis without current pathological fracture: Secondary | ICD-10-CM

## 2021-08-12 DIAGNOSIS — Z0000 Encounter for general adult medical examination without abnormal findings: Secondary | ICD-10-CM

## 2021-08-12 DIAGNOSIS — G3184 Mild cognitive impairment, so stated: Secondary | ICD-10-CM

## 2021-08-12 DIAGNOSIS — E7800 Pure hypercholesterolemia, unspecified: Secondary | ICD-10-CM | POA: Diagnosis not present

## 2021-08-12 LAB — COMPREHENSIVE METABOLIC PANEL
ALT: 12 U/L (ref 0–35)
AST: 17 U/L (ref 0–37)
Albumin: 3.9 g/dL (ref 3.5–5.2)
Alkaline Phosphatase: 31 U/L — ABNORMAL LOW (ref 39–117)
BUN: 22 mg/dL (ref 6–23)
CO2: 29 mEq/L (ref 19–32)
Calcium: 9 mg/dL (ref 8.4–10.5)
Chloride: 107 mEq/L (ref 96–112)
Creatinine, Ser: 0.82 mg/dL (ref 0.40–1.20)
GFR: 68.14 mL/min (ref >60.00)
Glucose, Bld: 82 mg/dL (ref 70–99)
Potassium: 4.2 mEq/L (ref 3.5–5.1)
Sodium: 142 mEq/L (ref 135–145)
Total Bilirubin: 0.6 mg/dL (ref 0.2–1.2)
Total Protein: 6.4 g/dL (ref 6.0–8.3)

## 2021-08-12 LAB — LIPID PANEL
Cholesterol: 138 mg/dL (ref 0–200)
HDL: 75.5 mg/dL (ref >39.00)
LDL Cholesterol: 51 mg/dL (ref 0–99)
NonHDL: 62.19
Total CHOL/HDL Ratio: 2
Triglycerides: 56 mg/dL (ref 0.0–149.0)
VLDL: 11.2 mg/dL (ref 0.0–40.0)

## 2021-08-12 LAB — TSH: TSH: 1.74 u[IU]/mL (ref 0.35–5.50)

## 2021-08-12 NOTE — Progress Notes (Signed)
OFFICE VISIT  08/12/2021  CC:  Chief Complaint  Patient presents with   Follow-up    RCI; pt is fasting   HPI:    Patient is a 80 y.o. female who presents accompanied by her husband Lyndle Herrlich for 6 mo f/u GAD, HLD, Hypothyroidism, and osteoporosis. A/P as of last visit: "1) HLD: tolerating rosuva 40mg  qd. Goal LDL <70.  Last LDL was 59 on 03/24/20. She is not fasting today.  Plan rpt flp and hepatic panel next f/u in 32mo.   2) Hypothyroidism: 88 mcg T4 qd, taking correctly. Monitor TSH today.   3) GAD: she went through some counseling and pt states she was told by the counselor that she does not need to continue going.  I encouraged pt to use her lorazepam more frequently--take 1-2 0.5mg  pills bid prn (new rx for #60, RF x1 given today).   4) Mild cognitive impairment. No imp with aricept started recently by Dr. Jannifer Franklin. She has f/u arranged for 06/2021 with him.   5) Osteoporosis: Tolerating alendronate just recently started. She's taking calcium but not vit D (she's not quite sure). Encouraged her to purchase oscal+d and take bie. Vit D and calcium level check today.   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   Hem/onc ref ordered (IgA monoclonal gammopathy) today."  INTERIM HX: Ayssa is feeling well.  She remains active and independent of all activities of daily living.  She continues to drive without problem.  She says her memory feels stable lately.  Last follow-up with neurology pretty recently--she did not tolerate Aricept due to increased dreams.  She declined a trial of Namenda.  No further testing or meds recommended at that time.  Celebrex--only occasional use.  Methotrexate helpful for RA---mostly hand pains.  She saw Dr. Marin Olp for the monoclonal gammopathy and was reassured.  He is seeing her again in 6 months She is tolerating alendronate without problem. She takes her Synthroid daily on  empty stomach.  Her anxiety level is overall very stable.  She has not used lorazepam but is comforted knowing she has the option if she needs it.  PMP AWARE :  PDMP review web site not operating today.  ROS as above, plus--> no fevers, no CP, no SOB, no wheezing, no cough, no dizziness, no HAs, no rashes, no melena/hematochezia.  No polyuria or polydipsia.  No myalgias or arthralgias.  No focal weakness, paresthesias, or tremors.  No acute vision or hearing abnormalities.  No dysuria or unusual/new urinary urgency or frequency.  No recent changes in lower legs. No n/v/d or abd pain.  No palpitations.    Past Medical History:  Diagnosis Date   Beta thalassemia, heterozygous    ?alpha? pt does not recall.   CAD (coronary artery disease) 07/2018   NSTEMI (small vessel occlusion, not amenable to intervention)-->2V CAD on cath, preserved LV systolic fxn.   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  Surgical path NEG for atypica or malignancy.   Memory difficulties 12/25/2020   NSTEMI (non-ST elevated myocardial infarction) ([place]) 07/2018   Dr. Percival Spanish   Osteoporosis 01/2021   01/2021  T scoree -2.7   Palpitations    08/2018 Event monitor normal. Long term event monitor 05/2020 w/out signif arrhythmia--some brief SVT.   Rheumatoid arthritis ([place]) 2007   muscle weaknes at times (Dr. Amil Amen)   Tinnitus, bilateral 2021   with high frequ SSHL->reassured by Ent    Past Surgical History:  Procedure Laterality Date   ABDOMINAL HYSTERECTOMY  1987   DUB, no malignancy.  Ovaries still in.   BLADDER REPAIR     Cardiac Event Monitor  08/22/2018   NO arrhythmias.  Her symptomatic  periods coresponded to NSR.   CHOLECYSTECTOMY  1969   COLONOSCOPY  11/07/2017   2014 adenoma.  Rpt 10/2017 adenoma x 1.  No further colonoscopies needed per GI.    DEXA  01/2021   T score -2.7   LEFT HEART CATH AND CORONARY ANGIOGRAPHY N/A 08/08/2018   Procedure: LEFT HEART CATH AND CORONARY ANGIOGRAPHY;  Surgeon: Troy Sine, MD;  Location: [facility];  Service: Cardiovascular;  Laterality: N/A;   THYROIDECTOMY  2012   cytologic atypia on nodule bx; surgical path NEG for atypica or malignancy.   TRANSTHORACIC ECHOCARDIOGRAM  08/07/2018   EF 60-65%, no wall motion abnl, grd II DD, mod MR.   UPPER GASTROINTESTINAL ENDOSCOPY     VAGINAL PROLAPSE REPAIR  2012    Outpatient Medications Prior to Visit  Medication Sig Dispense Refill   acetaminophen (TYLENOL) 500 MG tablet Take 500 mg by mouth every 6 (six) hours as needed for headache (pain).     alendronate (FOSAMAX) 70 MG tablet Take 1 tablet (70 mg total) by mouth once a week. Take with a full glass of water on an empty stomach. 4 tablet 11   aspirin EC 81 MG tablet Take 81 mg by mouth at bedtime.     Calcium Citrate-Vitamin D (CALCIUM CITRATE + D PO) Take 1 tablet by mouth 2 (two) times daily.      celecoxib (CELEBREX) 200 MG capsule Take by mouth daily.     Coenzyme Q10 (CO Q 10) 60 MG CAPS 1 capsule with a meal     diphenhydrAMINE (BENADRYL) 25 mg capsule 1 capsule as needed     fexofenadine (ALLEGRA) 180 MG tablet 1 tablet as needed     folic acid (FOLVITE) 1 MG tablet Take 1 mg by mouth 2 (two) times daily.     isosorbide mononitrate (IMDUR) 60 MG 24 hr tablet TAKE 1 TABLET BY MOUTH EVERY DAY 90 tablet 0   LORazepam (ATIVAN) 0.5 MG tablet 1-2 tabs po bid prn anxiety 60 tablet 1   methotrexate 2.5 MG tablet Take 12.5 mg by mouth every Sunday.      Multiple Vitamin (MULTIVITAMIN WITH MINERALS) TABS tablet Take 1 tablet by mouth daily.     Omega-3 Fatty Acids (FISH OIL PO) Take by mouth daily.     potassium chloride  (KLOR-CON) 8 MEQ tablet TAKE 2 TABLETS (16 MEQ TOTAL) BY MOUTH DAILY. 180 tablet 3   rosuvastatin (CRESTOR) 40 MG tablet TAKE 1 TABLET (40 MG TOTAL) BY MOUTH DAILY AT 6 PM. 90 tablet 3   ST JOHNS WORT PO Take by mouth.     SYNTHROID 88 MCG tablet TAKE 1 TABLET BY MOUTH DAILY BEFORE BREAKFAST. 30 tablet 0   nitroGLYCERIN (NITROSTAT) 0.4 MG SL tablet Place 1 tablet (0.4 mg total) under the tongue every 5 (five) minutes x 3 doses as needed for chest pain. (Patient not taking: Reported on 08/12/2021) 25 tablet 3  No facility-administered medications prior to visit.    Allergies  Allergen Reactions   Aricept [Donepezil]     Vivid dreams   Neosporin [Neomycin-Bacitracin Zn-Polymyx] Other (See Comments)    Patient states that wounds or scratches never heal. (pt uses bacitracin)    Other     Other reaction(s): Unknown   Seasonal Ic [Cholestatin] Other (See Comments)    Leg cramps   Triamcinolone Other (See Comments)    Burning sensation   Atorvastatin Other (See Comments)    Leg cramps   Monascus Purpureus Went Yeast Other (See Comments)    "Red Yeast Rice" causes leg cramps   Pravastatin Other (See Comments)    Leg cramps    ROS As per HPI  PE: Vitals with BMI 08/12/2021 07/09/2021 05/06/2021  Height 5\' 7"  5\' 7"  5\' 7"   Weight 134 lbs 3 oz 133 lbs 6 oz 133 lbs  BMI 21.01 45.40 98.11  Systolic 914 782 956  Diastolic 68 82 67  Pulse 47 57 60    Gen: Alert, well appearing.  Patient is oriented to person, place, time, and situation. AFFECT: pleasant, lucid thought and speech. CV: RRR, 1/6 systolic ejection murmur, no diastolic murmur, no r/g. No ectopy. EXT: no clubbing or cyanosis.  no edema.   LABS:  Lab Results  Component Value Date   TSH 0.64 02/03/2021   Lab Results  Component Value Date   WBC 5.3 02/24/2021   HGB 11.7 (L) 02/24/2021   HCT 36.0 02/24/2021   MCV 93.5 02/24/2021   PLT 238 02/24/2021   Lab Results  Component Value Date   CREATININE 0.70  02/24/2021   BUN 16 02/24/2021   NA 140 02/24/2021   K 4.2 02/24/2021   CL 108 02/24/2021   CO2 25 02/24/2021   Lab Results  Component Value Date   ALT 10 02/24/2021   AST 17 02/24/2021   ALKPHOS 46 02/24/2021   BILITOT 0.6 02/24/2021   Lab Results  Component Value Date   CHOL 137 03/24/2020   Lab Results  Component Value Date   HDL 68.20 03/24/2020   Lab Results  Component Value Date   LDLCALC 59 03/24/2020   Lab Results  Component Value Date   TRIG 48.0 03/24/2020   Lab Results  Component Value Date   CHOLHDL 2 03/24/2020   Lab Results  Component Value Date   HGBA1C 5.7 (H) 08/07/2018   IMPRESSION AND PLAN:  1) hyperlipidemia, doing well on rosuvastatin.  Goal LDL less than 70.  LDL was 59 in July 2021.  We will check lipid panel and hepatic panel today.  2.  Osteoporosis.  Tolerating alendronate well. Continue calcium vitamin D and alendronate.  Next DEXA to be around May 2023.  3. Hypothyroidism, postoperative. Hypoth: Takes T4 on empty stomach w/out any other meds. TSH today.  4) MCI, short term memory loss.  Seems pretty stable.  Did not tolerate Aricept.  Declined further trial of other medication and her neurologist, Dr. Jannifer Franklin, retired.  She wants to take the approach of watchful waiting at this time.  Of note Dr. 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  #5 anxiety.  She is doing well with meditation.  No lorazepam has been needed.  She is comforted by knowing she has this to take if needed.  No new prescription needed today.  6. Preventative health care: Vaccines: Prevnar 20->given today.  Otherwise ALL UTD. No further colon or cervical cancer  screening indicated. Breast ca screening->last one in EMR is 04/2020->ordered mammogram today. If this one is normal then pt doesn't want any further breast cancer screening. Next DEXA due around 01/2022 (by GYN or myself).   An After Visit Summary was printed and given  to the patient.  FOLLOW UP: No follow-ups on file.  Signed:  Crissie Sickles, MD           08/12/2021

## 2021-08-19 ENCOUNTER — Ambulatory Visit
Admission: RE | Admit: 2021-08-19 | Discharge: 2021-08-19 | Disposition: A | Discharge status: Home / Self Care | Payer: Medicare Other | Source: Ambulatory Visit | Attending: Family Medicine | Admitting: Family Medicine

## 2021-08-19 DIAGNOSIS — Z1231 Encounter for screening mammogram for malignant neoplasm of breast: Secondary | ICD-10-CM | POA: Diagnosis not present

## 2021-08-21 ENCOUNTER — Other Ambulatory Visit: Payer: Self-pay

## 2021-08-21 MED ORDER — LEVOTHYROXINE SODIUM 88 MCG PO TABS
88.0000 ug | ORAL_TABLET | Freq: Every day | ORAL | 1 refills | Status: DC
Start: 2021-08-21 — End: 2022-02-09

## 2021-08-27 ENCOUNTER — Encounter: Payer: Self-pay | Admitting: Hematology & Oncology

## 2021-08-27 ENCOUNTER — Other Ambulatory Visit: Payer: Self-pay

## 2021-08-27 ENCOUNTER — Inpatient Hospital Stay: Payer: Medicare Other | Attending: Hematology & Oncology

## 2021-08-27 ENCOUNTER — Inpatient Hospital Stay (HOSPITAL_BASED_OUTPATIENT_CLINIC_OR_DEPARTMENT_OTHER): Payer: Medicare Other | Admitting: Hematology & Oncology

## 2021-08-27 VITALS — BP 123/64 | HR 49 | Temp 97.5°F | Resp 18 | Wt 134.0 lb

## 2021-08-27 DIAGNOSIS — D472 Monoclonal gammopathy: Secondary | ICD-10-CM | POA: Diagnosis not present

## 2021-08-27 DIAGNOSIS — Z79899 Other long term (current) drug therapy: Secondary | ICD-10-CM | POA: Insufficient documentation

## 2021-08-27 DIAGNOSIS — M069 Rheumatoid arthritis, unspecified: Secondary | ICD-10-CM | POA: Diagnosis not present

## 2021-08-27 LAB — CMP (CANCER CENTER ONLY)
ALT: 12 U/L (ref 0–44)
AST: 20 U/L (ref 15–41)
Albumin: 4 g/dL (ref 3.5–5.0)
Alkaline Phosphatase: 31 U/L — ABNORMAL LOW (ref 38–126)
Anion gap: 6 (ref 5–15)
BUN: 18 mg/dL (ref 8–23)
CO2: 30 mmol/L (ref 22–32)
Calcium: 9.5 mg/dL (ref 8.9–10.3)
Chloride: 107 mmol/L (ref 98–111)
Creatinine: 0.81 mg/dL (ref 0.44–1.00)
GFR, Estimated: >60 mL/min (ref >60)
Glucose, Bld: 93 mg/dL (ref 70–99)
Potassium: 4.3 mmol/L (ref 3.5–5.1)
Sodium: 143 mmol/L (ref 135–145)
Total Bilirubin: 0.5 mg/dL (ref 0.3–1.2)
Total Protein: 7 g/dL (ref 6.5–8.1)

## 2021-08-27 LAB — CBC WITH DIFFERENTIAL (CANCER CENTER ONLY)
Abs Immature Granulocytes: 0.01 10*3/uL (ref 0.00–0.07)
Basophils Absolute: 0.1 10*3/uL (ref 0.0–0.1)
Basophils Relative: 3 %
Eosinophils Absolute: 0.2 10*3/uL (ref 0.0–0.5)
Eosinophils Relative: 4 %
HCT: 38.8 % (ref 36.0–46.0)
Hemoglobin: 12.5 g/dL (ref 12.0–15.0)
Immature Granulocytes: 0 %
Lymphocytes Relative: 29 %
Lymphs Abs: 1.5 10*3/uL (ref 0.7–4.0)
MCH: 31.6 pg (ref 26.0–34.0)
MCHC: 32.2 g/dL (ref 30.0–36.0)
MCV: 98 fL (ref 80.0–100.0)
Monocytes Absolute: 0.4 10*3/uL (ref 0.1–1.0)
Monocytes Relative: 8 %
Neutro Abs: 2.9 10*3/uL (ref 1.7–7.7)
Neutrophils Relative %: 56 %
Platelet Count: 244 10*3/uL (ref 150–400)
RBC: 3.96 MIL/uL (ref 3.87–5.11)
RDW: 14.6 % (ref 11.5–15.5)
WBC Count: 5.1 10*3/uL (ref 4.0–10.5)
nRBC: 0 % (ref 0.0–0.2)

## 2021-08-27 LAB — LACTATE DEHYDROGENASE: LDH: 220 U/L — ABNORMAL HIGH (ref 98–192)

## 2021-08-27 NOTE — Progress Notes (Signed)
Hematology and Oncology Follow Up Visit  Bonnie [name] [id number] August 14, 1941 80 y.o. 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  We saw her 6 months ago.  Our first saw her, we did a work-up for the MGUS.  Her SPEP really was not remarkable.  We really cannot find much of a monoclonal spike.  However, on immunofixation, there was a IgA lambda MGUS.  Her IgA level was 274 mg/dL.  The lambda light chain was 1.8 mg/dL.  I truly believe that this is all reflective of her rheumatoid arthritis.  She is on methotrexate for her rheumatoid arthritis.  She has had no problems with infections.  She has had no issues with nausea or vomiting.  There is been no cough or shortness of breath.  He has had no issues with bony pain.  He does have the rheumatism.  Is no change in bowel or bladder habits.  We did do a 24-hour urine on her.  We did not see any abnormal protein in the urine.  Overall, I would have to say that her performance status is probably ECOG 1.  Medications:  Current Outpatient Medications:    acetaminophen (TYLENOL) 500 MG tablet, Take 500 mg by mouth every 6 (six) hours as needed for headache (pain)., Disp: , Rfl:    alendronate (FOSAMAX) 70 MG tablet, Take 1 tablet (70 mg total) by mouth once a week. Take with a full glass of water on an empty stomach., Disp: 4 tablet, Rfl: 11   aspirin EC 81 MG tablet, Take 81 mg by mouth at bedtime., Disp: , Rfl:    Calcium Citrate-Vitamin D (CALCIUM CITRATE + D PO), Take 1 tablet by mouth 2 (two) times daily. , Disp: , Rfl:    celecoxib (CELEBREX) 200 MG capsule, Take by mouth daily as needed., Disp: , Rfl:    Coenzyme Q10 (CO Q 10) 60 MG CAPS, 1 capsule with a meal, Disp: , Rfl:    diphenhydrAMINE (BENADRYL) 25 mg capsule, 1 capsule as needed, Disp: , Rfl:    fexofenadine (ALLEGRA) 180 MG  tablet, 1 tablet as needed, Disp: , Rfl:    folic acid (FOLVITE) 1 MG tablet, Take 1 mg by mouth 2 (two) times daily., Disp: , Rfl:    isosorbide mononitrate (IMDUR) 60 MG 24 hr tablet, TAKE 1 TABLET BY MOUTH EVERY DAY, Disp: 90 tablet, Rfl: 0   levothyroxine (SYNTHROID) 88 MCG tablet, Take 1 tablet (88 mcg total) by mouth daily before breakfast., Disp: 90 tablet, Rfl: 1   LORazepam (ATIVAN) 0.5 MG tablet, 1-2 tabs po bid prn anxiety, Disp: 60 tablet, Rfl: 1   methotrexate 2.5 MG tablet, Take 12.5 mg by mouth every Sunday. , Disp: , Rfl:    Multiple Vitamin (MULTIVITAMIN WITH MINERALS) TABS tablet, Take 1 tablet by mouth daily., Disp: , Rfl:    nitroGLYCERIN (NITROSTAT) 0.4 MG SL tablet, Place 1 tablet (0.4 mg total) under the tongue every 5 (five) minutes x 3 doses as needed for chest pain. (Patient not taking: Reported on 08/12/2021), Disp: 25 tablet, Rfl: 3   Omega-3 Fatty Acids (FISH OIL PO), Take by mouth daily., Disp: , Rfl:    potassium chloride (KLOR-CON) 8 MEQ tablet, TAKE 2 TABLETS (16 MEQ TOTAL) BY MOUTH DAILY., Disp: 180 tablet, Rfl: 3   rosuvastatin (CRESTOR) 40 MG tablet,  TAKE 1 TABLET (40 MG TOTAL) BY MOUTH DAILY AT 6 PM., Disp: 90 tablet, Rfl: 3   ST JOHNS WORT PO, Take by mouth., Disp: , Rfl:   Allergies:  Allergies  Allergen Reactions   Aricept [Donepezil]     Vivid dreams   Neosporin [Neomycin-Bacitracin Zn-Polymyx] Other (See Comments)    Patient states that wounds or scratches never heal. (pt uses bacitracin)    Other     Other reaction(s): Unknown   Seasonal Ic [Cholestatin] Other (See Comments)    Leg cramps   Triamcinolone Other (See Comments)    Burning sensation   Atorvastatin Other (See Comments)    Leg cramps   Monascus Purpureus Went Yeast Other (See Comments)    "Red Yeast Rice" causes leg cramps   Pravastatin Other (See Comments)    Leg cramps    Past Medical History, Surgical history, Social history, and Family History were reviewed and  updated.  Review of Systems: Review of Systems  Constitutional: Negative.   HENT:  Negative.    Eyes: Negative.   Respiratory: Negative.    Cardiovascular: Negative.  Negative for chest pain.  Gastrointestinal: Negative.   Endocrine: Negative.   Genitourinary: Negative.    Musculoskeletal: Negative.   Skin: Negative.   Neurological: Negative.   Hematological: Negative.   Psychiatric/Behavioral: Negative.     Physical Exam:  weight is 134 lb (60.8 kg). Her oral temperature is 97.5 F (36.4 C) (abnormal). Her blood pressure is 123/64 and her pulse is 49 (abnormal). Her respiration is 18 and oxygen saturation is 100%.   Wt Readings from Last 3 Encounters:  08/27/21 134 lb (60.8 kg)  08/12/21 134 lb 3.2 oz (60.9 kg)  07/09/21 133 lb 6 oz (60.5 kg)    Physical Exam Vitals reviewed.  HENT:     Head: Normocephalic and atraumatic.  Eyes:     Pupils: Pupils are equal, round, and reactive to light.  Cardiovascular:     Rate and Rhythm: Normal rate and regular rhythm.     Heart sounds: Normal heart sounds.  Pulmonary:     Effort: Pulmonary effort is normal.     Breath sounds: Normal breath sounds.  Abdominal:     General: Bowel sounds are normal.     Palpations: Abdomen is soft.  Musculoskeletal:        General: No tenderness or deformity. Normal range of motion.     Cervical back: Normal range of motion.  Lymphadenopathy:     Cervical: No cervical adenopathy.  Skin:    General: Skin is warm and dry.     Findings: No erythema or rash.  Neurological:     Mental Status: She is alert and oriented to person, place, and time.  Psychiatric:        Behavior: Behavior normal.        Thought Content: Thought content normal.        Judgment: Judgment normal.     Lab Results  Component Value Date   WBC 5.1 08/27/2021   HGB 12.5 08/27/2021   HCT 38.8 08/27/2021   MCV 98.0 08/27/2021   PLT 244 08/27/2021     Chemistry      Component Value Date/Time   NA 143 08/27/2021  0933   NA 141 08/29/2018 0000   K 4.3 08/27/2021 0933   CL 107 08/27/2021 0933   CO2 30 08/27/2021 0933   BUN 18 08/27/2021 0933   BUN 18 08/29/2018 0000   CREATININE 0.81 08/27/2021  8675   CREATININE 0.99 (H) 10/12/2019 1357      Component Value Date/Time   CALCIUM 9.5 08/27/2021 0933   ALKPHOS 31 (L) 08/27/2021 0933   AST 20 08/27/2021 0933   ALT 12 08/27/2021 0933   BILITOT 0.5 08/27/2021 0933      Impression and Plan: Bonnie [name] is a very charming 80 year old white female.  She has rheumatoid arthritis.  Again, I think because of this, she has MGUS.  It is a incredibly low level.  Again, on the SPEP we really cannot find the M spike.  I really think we can just follow her yearly.  I just do not believe this MGUS is going to be a problem for her.  I do not see a problem with her taking the methotrexate.  Her blood counts look okay.  She and her husband are both very nice.  They will have a wonderful Christmas.  They will be planning on going down to [facility].   Volanda Napoleon, MD 12/15/202210:55 AM

## 2021-08-28 LAB — IGG, IGA, IGM
IgA: 302 mg/dL (ref 64–422)
IgG (Immunoglobin G), Serum: 991 mg/dL (ref 586–1602)
IgM (Immunoglobulin M), Srm: 52 mg/dL (ref 26–217)

## 2021-08-28 LAB — KAPPA/LAMBDA LIGHT CHAINS
Kappa free light chain: 25.7 mg/L — ABNORMAL HIGH (ref 3.3–19.4)
Kappa, lambda light chain ratio: 1.39 (ref 0.26–1.65)
Lambda free light chains: 18.5 mg/L (ref 5.7–26.3)

## 2021-08-28 LAB — BETA 2 MICROGLOBULIN, SERUM: Beta-2 Microglobulin: 2 mg/L (ref 0.6–2.4)

## 2021-08-31 LAB — PROTEIN ELECTROPHORESIS, SERUM, WITH REFLEX
A/G Ratio: 1.2 (ref 0.7–1.7)
Albumin ELP: 3.6 g/dL (ref 2.9–4.4)
Alpha-1-Globulin: 0.3 g/dL (ref 0.0–0.4)
Alpha-2-Globulin: 0.9 g/dL (ref 0.4–1.0)
Beta Globulin: 1 g/dL (ref 0.7–1.3)
Gamma Globulin: 1 g/dL (ref 0.4–1.8)
Globulin, Total: 3.1 g/dL (ref 2.2–3.9)
Total Protein ELP: 6.7 g/dL (ref 6.0–8.5)

## 2021-09-16 ENCOUNTER — Other Ambulatory Visit: Payer: Self-pay

## 2021-09-16 ENCOUNTER — Ambulatory Visit (INDEPENDENT_AMBULATORY_CARE_PROVIDER_SITE_OTHER): Payer: Medicare Other

## 2021-09-16 VITALS — BP 120/70 | HR 63 | Temp 98.1°F | Wt 134.4 lb

## 2021-09-16 DIAGNOSIS — Z0000 Encounter for general adult medical examination without abnormal findings: Secondary | ICD-10-CM | POA: Diagnosis not present

## 2021-09-16 NOTE — Patient Instructions (Signed)
Bonnie [name] , Thank you for taking time to come for your Medicare Wellness Visit. I appreciate your ongoing commitment to your health goals. Please review the following plan we discussed and let me know if I can assist you in the future.   Screening recommendations/referrals: Colonoscopy: Done 11/07/17 Mammogram: Done 08/19/21 repeat every year Bone Density: Done 01/07/21 repeat every 2 years  Recommended yearly ophthalmology/optometry visit for glaucoma screening and checkup Recommended yearly dental visit for hygiene and checkup  Vaccinations: Influenza vaccine: Done 06/09/21 repeat every year Pneumococcal vaccine: Up to date Tdap vaccine: Done 11/11/18 Shingles vaccine: Completed 2/29, 03/01/19   Covid-19:completed 1/22, 2/11, 06/17/20 & 12/24/20   Advanced directives: Please bring a copy of your health care power of attorney and living will to the office at your convenience.  Conditions/risks identified: none at this time   Next appointment: Follow up in one year for your annual wellness visit    Preventive Care 65 Years and Older, Female Preventive care refers to lifestyle choices and visits with your health care provider that can promote health and wellness. What does preventive care include? A yearly physical exam. This is also called an annual well check. Dental exams once or twice a year. Routine eye exams. Ask your health care provider how often you should have your eyes checked. Personal lifestyle choices, including: Daily care of your teeth and gums. Regular physical activity. Eating a healthy diet. Avoiding tobacco and drug use. Limiting alcohol use. Practicing safe sex. Taking low-dose aspirin every day. Taking vitamin and mineral supplements as recommended by your health care provider. What happens during an annual well check? The services and screenings done by your health care provider during your annual well check will depend on your age, overall health, lifestyle risk  factors, and family history of disease. Counseling  Your health care provider may ask you questions about your: Alcohol use. Tobacco use. Drug use. Emotional well-being. Home and relationship well-being. Sexual activity. Eating habits. History of falls. Memory and ability to understand (cognition). Work and work Statistician. Reproductive health. Screening  You may have the following tests or measurements: Height, weight, and BMI. Blood pressure. Lipid and cholesterol levels. These may be checked every 5 years, or more frequently if you are over 12 years old. Skin check. Lung cancer screening. You may have this screening every year starting at age 34 if you have a 30-pack-year history of smoking and currently smoke or have quit within the past 15 years. Fecal occult blood test (FOBT) of the stool. You may have this test every year starting at age 52. Flexible sigmoidoscopy or colonoscopy. You may have a sigmoidoscopy every 5 years or a colonoscopy every 10 years starting at age 58. Hepatitis C blood test. Hepatitis B blood test. Sexually transmitted disease (STD) testing. Diabetes screening. This is done by checking your blood sugar (glucose) after you have not eaten for a while (fasting). You may have this done every 1-3 years. Bone density scan. This is done to screen for osteoporosis. You may have this done starting at age 78. Mammogram. This may be done every 1-2 years. Talk to your health care provider about how often you should have regular mammograms. Talk with your health care provider about your test results, treatment options, and if necessary, the need for more tests. Vaccines  Your health care provider may recommend certain vaccines, such as: Influenza vaccine. This is recommended every year. Tetanus, diphtheria, and acellular pertussis (Tdap, Td) vaccine. You may need a  Td booster every 10 years. Zoster vaccine. You may need this after age 34. Pneumococcal 13-valent  conjugate (PCV13) vaccine. One dose is recommended after age 77. Pneumococcal polysaccharide (PPSV23) vaccine. One dose is recommended after age 74. Talk to your health care provider about which screenings and vaccines you need and how often you need them. This information is not intended to replace advice given to you by your health care provider. Make sure you discuss any questions you have with your health care provider. Document Released: 09/26/2015 Document Revised: 05/19/2016 Document Reviewed: 07/01/2015 Elsevier Interactive Patient Education  2017 [place] Prevention in the Home Falls can cause injuries. They can happen to people of all ages. There are many things you can do to make your home safe and to help prevent falls. What can I do on the outside of my home? Regularly fix the edges of walkways and driveways and fix any cracks. Remove anything that might make you trip as you walk through a door, such as a raised step or threshold. Trim any bushes or trees on the path to your home. Use bright outdoor lighting. Clear any walking paths of anything that might make someone trip, such as rocks or tools. Regularly check to see if handrails are loose or broken. Make sure that both sides of any steps have handrails. Any raised decks and porches should have guardrails on the edges. Have any leaves, snow, or ice cleared regularly. Use sand or salt on walking paths during winter. Clean up any spills in your garage right away. This includes oil or grease spills. What can I do in the bathroom? Use night lights. Install grab bars by the toilet and in the tub and shower. Do not use towel bars as grab bars. Use non-skid mats or decals in the tub or shower. If you need to sit down in the shower, use a plastic, non-slip stool. Keep the floor dry. Clean up any water that spills on the floor as soon as it happens. Remove soap buildup in the tub or shower regularly. Attach bath mats  securely with double-sided non-slip rug tape. Do not have throw rugs and other things on the floor that can make you trip. What can I do in the bedroom? Use night lights. Make sure that you have a light by your bed that is easy to reach. Do not use any sheets or blankets that are too big for your bed. They should not hang down onto the floor. Have a firm chair that has side arms. You can use this for support while you get dressed. Do not have throw rugs and other things on the floor that can make you trip. What can I do in the kitchen? Clean up any spills right away. Avoid walking on wet floors. Keep items that you use a lot in easy-to-reach places. If you need to reach something above you, use a strong step stool that has a grab bar. Keep electrical cords out of the way. Do not use floor polish or wax that makes floors slippery. If you must use wax, use non-skid floor wax. Do not have throw rugs and other things on the floor that can make you trip. What can I do with my stairs? Do not leave any items on the stairs. Make sure that there are handrails on both sides of the stairs and use them. Fix handrails that are broken or loose. Make sure that handrails are as long as the stairways. Check any  carpeting to make sure that it is firmly attached to the stairs. Fix any carpet that is loose or worn. Avoid having throw rugs at the top or bottom of the stairs. If you do have throw rugs, attach them to the floor with carpet tape. Make sure that you have a light switch at the top of the stairs and the bottom of the stairs. If you do not have them, ask someone to add them for you. What else can I do to help prevent falls? Wear shoes that: Do not have high heels. Have rubber bottoms. Are comfortable and fit you well. Are closed at the toe. Do not wear sandals. If you use a stepladder: Make sure that it is fully opened. Do not climb a closed stepladder. Make sure that both sides of the stepladder  are locked into place. Ask someone to hold it for you, if possible. Clearly mark and make sure that you can see: Any grab bars or handrails. First and last steps. Where the edge of each step is. Use tools that help you move around (mobility aids) if they are needed. These include: Canes. Walkers. Scooters. Crutches. Turn on the lights when you go into a dark area. Replace any light bulbs as soon as they burn out. Set up your furniture so you have a clear path. Avoid moving your furniture around. If any of your floors are uneven, fix them. If there are any pets around you, be aware of where they are. Review your medicines with your doctor. Some medicines can make you feel dizzy. This can increase your chance of falling. Ask your doctor what other things that you can do to help prevent falls. This information is not intended to replace advice given to you by your health care provider. Make sure you discuss any questions you have with your health care provider. Document Released: 06/26/2009 Document Revised: 02/05/2016 Document Reviewed: 10/04/2014 Elsevier Interactive Patient Education  2017 [employer].

## 2021-09-16 NOTE — Progress Notes (Addendum)
Subjective:   Bonnie [name] is a 81 y.o. female who presents for Medicare Annual (Subsequent) preventive examination.along with Daughter Bonnie [name]  Review of Systems     Cardiac Risk Factors include: advanced age (>35men, >70 women);dyslipidemia     Objective:    Today's Vitals   09/16/21 1436  BP: 120/70  Pulse: 63  Temp: 98.1 F (36.7 C)  SpO2: 98%  Weight: 134 lb 6.4 oz (61 kg)   Body mass index is 21.05 kg/m.  Advanced Directives 09/16/2021 08/27/2021 02/24/2021 08/27/2020 09/07/2018 08/14/2018 08/07/2018  Does Patient Have a Medical Advance Directive? Yes Yes Yes Yes No No No  Type of Paramedic of [place];Living will [place];Living will Living will - - -  Does patient want to make changes to medical advance directive? - No - Patient declined No - Patient declined - - - -  Copy of [place] in Chart? No - copy requested - No - copy requested - - - -  Would patient like information on creating a medical advance directive? - - No - Patient declined - - - No - Patient declined    Current Medications (verified) Outpatient Encounter Medications as of 09/16/2021  Medication Sig   acetaminophen (TYLENOL) 500 MG tablet Take 500 mg by mouth every 6 (six) hours as needed for headache (pain).   alendronate (FOSAMAX) 70 MG tablet Take 1 tablet (70 mg total) by mouth once a week. Take with a full glass of water on an empty stomach.   aspirin EC 81 MG tablet Take 81 mg by mouth at bedtime.   Calcium Citrate-Vitamin D (CALCIUM CITRATE + D PO) Take 1 tablet by mouth 2 (two) times daily.    celecoxib (CELEBREX) 200 MG capsule Take by mouth daily as needed.   Coenzyme Q10 (CO Q 10) 60 MG CAPS 1 capsule with a meal   diphenhydrAMINE (BENADRYL) 25 mg capsule 1 capsule as needed   fexofenadine (ALLEGRA) 180 MG tablet 1 tablet as needed   folic acid (FOLVITE) 1 MG tablet Take 1 mg by mouth 2  (two) times daily.   isosorbide mononitrate (IMDUR) 60 MG 24 hr tablet TAKE 1 TABLET BY MOUTH EVERY DAY   levothyroxine (SYNTHROID) 88 MCG tablet Take 1 tablet (88 mcg total) by mouth daily before breakfast.   LORazepam (ATIVAN) 0.5 MG tablet 1-2 tabs po bid prn anxiety   methotrexate 2.5 MG tablet Take 12.5 mg by mouth every Sunday.    Multiple Vitamin (MULTIVITAMIN WITH MINERALS) TABS tablet Take 1 tablet by mouth daily.   nitroGLYCERIN (NITROSTAT) 0.4 MG SL tablet Place 1 tablet (0.4 mg total) under the tongue every 5 (five) minutes x 3 doses as needed for chest pain.   Omega-3 Fatty Acids (FISH OIL PO) Take by mouth daily.   potassium chloride (KLOR-CON) 8 MEQ tablet TAKE 2 TABLETS (16 MEQ TOTAL) BY MOUTH DAILY.   rosuvastatin (CRESTOR) 40 MG tablet TAKE 1 TABLET (40 MG TOTAL) BY MOUTH DAILY AT 6 PM.   ST JOHNS WORT PO Take by mouth.   No facility-administered encounter medications on file as of 09/16/2021.    Allergies (verified) Aricept [donepezil], Neosporin [neomycin-bacitracin zn-polymyx], Other, Seasonal ic [cholestatin], Triamcinolone, Atorvastatin, Monascus purpureus went yeast, and Pravastatin   History: Past Medical History:  Diagnosis Date   Beta thalassemia, heterozygous    ?alpha? pt does not recall.   CAD (coronary artery disease) 07/2018   NSTEMI (  small vessel occlusion, not amenable to intervention)-->2V CAD on cath, preserved LV systolic fxn.   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  Surgical path NEG for atypica or malignancy.   Memory difficulties 12/25/2020   MGUS (monoclonal gammopathy of unknown significance) 2022   Dr. Marin [name] eval  02/2021-->he feels it is likely reactive d/t her RA.  Low suspicion of plasma cell dyscrasia-->f/u 103mo.   NSTEMI (non-ST elevated myocardial infarction) ([place]) 07/2018   Dr. Percival [name]   Osteoporosis 01/2021   01/2021 T scoree -2.7   Palpitations    08/2018 Event monitor normal. Long term event monitor 05/2020 w/out signif arrhythmia--some brief SVT.   Rheumatoid arthritis ([place]) 2007   muscle weaknes at times (Dr. Amil [name])   Tinnitus, bilateral 2021   with high frequ SSHL->reassured by Ent   Past Surgical History:  Procedure Laterality Date   ABDOMINAL HYSTERECTOMY  1987   DUB, no malignancy.  Ovaries still in.   BLADDER REPAIR     Cardiac Event Monitor  08/22/2018   NO arrhythmias.  Her symptomatic periods coresponded to NSR.   CHOLECYSTECTOMY  1969   COLONOSCOPY  11/07/2017   2014 adenoma.  Rpt 10/2017 adenoma x 1.  No further colonoscopies needed per GI.    DEXA  01/2021   T score -2.7   LEFT HEART CATH AND CORONARY ANGIOGRAPHY N/A 08/08/2018   Procedure: LEFT HEART CATH AND CORONARY ANGIOGRAPHY;  Surgeon: Bonnie Sine, MD;  Location: [facility];  Service: Cardiovascular;  Laterality: N/A;   THYROIDECTOMY  2012   cytologic atypia on nodule bx; surgical path NEG for atypica or malignancy.   TRANSTHORACIC ECHOCARDIOGRAM  08/07/2018   EF 60-65%, no wall motion abnl, grd II DD, mod MR.   UPPER GASTROINTESTINAL ENDOSCOPY     VAGINAL PROLAPSE REPAIR  2012   Family History  Problem Relation Age of Onset   Hypertension Mother    CVA Mother    Colon cancer Neg Hx    Esophageal cancer Neg Hx    Stomach cancer Neg Hx    Rectal cancer Neg Hx    Social History   Socioeconomic History   Marital status: Married    Spouse name: Bonnie [name]   Number of children: 5   Years of education: Not on file   Highest education level: Not on file  Occupational History   Not on file  Tobacco Use   Smoking status: Former    Packs/day: 0.50    Years: 10.00    Pack years: 5.00    Types:  Cigarettes    Quit date: 11/19/1973    Years since quitting: 47.8   Smokeless tobacco: Never  Vaping Use   Vaping Use: Never used  Substance and Sexual Activity   Alcohol use: Yes    Alcohol/week: 0.0 standard drinks    Comment: very rare glass of wine   Drug use: No   Sexual activity: Yes  Other Topics Concern   Not on file  Social History Narrative   Lives with husband in [place].   Right Handed   Drinks 4-5 cups caffeine daily   Social Determinants of Radio broadcast assistant  Strain: Low Risk    Difficulty of Paying Living Expenses: Not hard at all  Food Insecurity: No Food Insecurity   Worried About Charity fundraiser in the Last Year: Never true   Ran Out of Food in the Last Year: Never true  Transportation Needs: No Transportation Needs   Lack of Transportation (Medical): No   Lack of Transportation (Non-Medical): No  Physical Activity: Inactive   Days of Exercise per Week: 0 days   Minutes of Exercise per Session: 0 min  Stress: No Stress Concern Present   Feeling of Stress : Only a little  Social Connections: Engineer, building services of Communication with Friends and Family: More than three times a week   Frequency of Social Gatherings with Friends and Family: More than three times a week   Attends Religious Services: More than 4 times per year   Active Member of [employer] or Organizations: Yes   Attends Archivist Meetings: 1 to 4 times per year   Marital Status: Married    Tobacco Counseling Counseling given: Not Answered   Clinical Intake:  Pre-visit preparation completed: Yes  Pain : No/denies pain     BMI - recorded: 21.05 Nutritional Status: BMI of 19-24  Normal Nutritional Risks: None Diabetes: No  How often do you need to have someone help you when you read instructions, pamphlets, or other written materials from your doctor or pharmacy?: 1 - Never  Diabetic?no  Interpreter Needed?: No  Information entered by :: Jolyne Loa, LPN   Activities of Daily Living In your present state of health, do you have any difficulty performing the following activities: 09/16/2021  Hearing? N  Vision? N  Difficulty concentrating or making decisions? N  Walking or climbing stairs? N  Dressing or bathing? N  Doing errands, shopping? N  Preparing Food and eating ? N  Using the Toilet? N  In the past six months, have you accidently leaked urine? N  Do you have problems with loss of bowel control? N  Managing your Medications? N  Managing your Finances? N  Housekeeping or managing your Housekeeping? N  Some recent data might be hidden    Patient Care Team: Tammi Sou, MD as PCP - General (Family Medicine) Minus Breeding, MD as PCP - Cardiology (Cardiology) Altheimer, Legrand Como, MD as Consulting Physician (Endocrinology) Ladene Artist, MD as Consulting Physician (Gastroenterology) Minus Breeding, MD as Consulting Physician (Cardiology) Olena Leatherwood, MD as Consulting Physician (Rheumatology) Vania Rea, MD as Consulting Physician (Obstetrics and Gynecology) Armandina Gemma, MD as Consulting Physician (General Surgery) Jolene Provost, PA-C as Physician Assistant (Otolaryngology)  Indicate any recent Medical Services you may have received from other than [facility] providers in the past year (date may be approximate).     Assessment:   This is a routine wellness examination for [facility].  Hearing/Vision screen Hearing Screening - Comments:: Pt denies any hearing issues  Vision Screening - Comments:: Pt follows up with Dr Clydene Laming for eye exams  Dietary issues and exercise activities discussed: Current Exercise Habits: The patient does not participate in regular exercise at present   Goals Addressed             This Visit's Progress    Patient Stated       None at this time       Depression Screen PHQ 2/9 Scores 09/16/2021 08/12/2021 08/27/2020 10/12/2019 08/04/2018 06/14/2012  PHQ - 2  Score 1 1 1  0 0 1  Fall Risk Fall Risk  09/16/2021 08/12/2021 02/03/2021 08/27/2020 08/04/2018  Falls in the past year? 0 0 0 0 0  Comment - - - - -  Number falls in past yr: 0 0 0 0 -  Injury with Fall? 0 0 0 0 -  Risk for fall due to : Impaired vision - - - -  Follow up Falls prevention discussed Falls evaluation completed - Falls prevention discussed -    FALL RISK PREVENTION PERTAINING TO THE HOME:  Any stairs in or around the home? Yes  If so, are there any without handrails? No  Home free of loose throw rugs in walkways, pet beds, electrical cords, etc? Yes  Adequate lighting in your home to reduce risk of falls? Yes   ASSISTIVE DEVICES UTILIZED TO PREVENT FALLS:  Life alert? No  Use of a cane, walker or w/c? No  Grab bars in the bathroom? Yes  Shower chair or bench in shower? Yes  Elevated toilet seat or a handicapped toilet? No   TIMED UP AND GO:  Was the test performed? Yes .  Length of time to ambulate 10 feet: 10 sec.   Gait steady and fast without use of assistive device  Cognitive Function: MMSE - Mini Mental State Exam 05/06/2021 12/25/2020  Orientation to time 4 3  Orientation to Place 4 5  Registration 3 3  Attention/ Calculation 3 5  Recall 1 3  Language- name 2 objects 2 2  Language- repeat 1 1  Language- follow 3 step command 3 3  Language- read & follow direction 1 1  Write a sentence 1 1  Copy design 0 1  Total score 23 28     6CIT Screen 08/27/2020  What Year? 0 points  What month? 0 points  What time? 3 points  Count back from 20 0 points  Months in reverse 0 points  Repeat phrase 0 points  Total Score 3    Immunizations Immunization History  Administered Date(s) Administered   Fluad Quad(high Dose 65+) 07/28/2020, 06/09/2021   Hepatitis A 05/26/2006, 06/27/2006   Hepatitis B 05/26/2006, 06/27/2006, 01/20/2007   IPV 08/01/1941   Influenza Split 06/14/2011, 06/14/2012   Influenza Whole 07/10/2007, 07/17/2009, 08/20/2010    Influenza, High Dose Seasonal PF 07/08/2014, 05/20/2016, 06/22/2017, 08/04/2018   Influenza-Unspecified 06/13/2016   PFIZER(Purple Top)SARS-COV-2 Vaccination 10/05/2019, 10/25/2019, 06/17/2020, 12/24/2020   PNEUMOCOCCAL CONJUGATE-20 08/12/2021   Pneumococcal Polysaccharide-23 06/02/2007, 07/14/2012   Td 09/13/2005   Tdap 11/11/2018   Typhoid Parenteral 05/26/2006   Varicella 06/02/1955   Varicella Zoster Immune Globulin 07/28/2012   Zoster Recombinat (Shingrix) 11/11/2018, 03/01/2019, 06/17/2020   Zoster, Live 07/29/2012, 11/11/2018    TDAP status: Up to date  Flu Vaccine status: Up to date  Pneumococcal vaccine status: Up to date  Covid-19 vaccine status: Completed vaccines  Qualifies for Shingles Vaccine? Yes   Zostavax completed Yes   Shingrix Completed?: Yes  Screening Tests Health Maintenance  Topic Date Due   COVID-19 Vaccine (5 - Booster for Pfizer series) 02/18/2021   MAMMOGRAM  08/19/2022   TETANUS/TDAP  11/10/2028   Pneumonia Vaccine 40+ Years old  Completed   INFLUENZA VACCINE  Completed   DEXA SCAN  Completed   Hepatitis C Screening  Completed   Zoster Vaccines- Shingrix  Completed   HPV VACCINES  Aged Out    Health Maintenance  Health Maintenance Due  Topic Date Due   COVID-19 Vaccine (5 - Booster for [place] series) 02/18/2021    Colorectal cancer  screening: No longer required.   Mammogram status: Completed 08/19/21. Repeat every year  Bone Density status: Completed 01/07/21. Results reflect: Bone density results: OSTEOPOROSIS. Repeat every 2 years.   Additional Screening:  Hepatitis C Screening:  Completed 12/25/20  Vision Screening: Recommended annual ophthalmology exams for early detection of glaucoma and other disorders of the eye. Is the patient up to date with their annual eye exam?  Yes  Who is the provider or what is the name of the office in which the patient attends annual eye exams? Dr Clydene Laming If pt is not established with a provider,  would they like to be referred to a provider to establish care? No .   Dental Screening: Recommended annual dental exams for proper oral hygiene  Community Resource Referral / Chronic Care Management: CRR required this visit?  No   CCM required this visit?  No      Plan:     I have personally reviewed and noted the following in the patients chart:   Medical and social history Use of alcohol, tobacco or illicit drugs  Current medications and supplements including opioid prescriptions.  Functional ability and status Nutritional status Physical activity Advanced directives List of other physicians Hospitalizations, surgeries, and ER visits in previous 12 months Vitals Screenings to include cognitive, depression, and falls Referrals and appointments  In addition, I have reviewed and discussed with patient certain preventive protocols, quality metrics, and best practice recommendations. A written personalized care plan for preventive services as well as general preventive health recommendations were provided to patient.     Willette Brace, LPN   10/17/4626   Nurse Notes:  pt has some concerns about memory and her daughter has paper work from [employer] [facility] memory assessment clinic needed to rule out any problems based on age and or health please advise

## 2021-09-17 ENCOUNTER — Other Ambulatory Visit: Payer: Self-pay

## 2021-09-17 MED ORDER — ISOSORBIDE MONONITRATE ER 60 MG PO TB24: 60.0000 mg | ORAL_TABLET | Freq: Every day | ORAL | 2 refills | Status: DC

## 2021-09-23 DIAGNOSIS — M791 Myalgia, unspecified site: Secondary | ICD-10-CM | POA: Diagnosis not present

## 2021-09-23 DIAGNOSIS — Z6820 Body mass index (BMI) 20.0-20.9, adult: Secondary | ICD-10-CM | POA: Diagnosis not present

## 2021-09-23 DIAGNOSIS — Z79899 Other long term (current) drug therapy: Secondary | ICD-10-CM | POA: Diagnosis not present

## 2021-09-23 DIAGNOSIS — M0589 Other rheumatoid arthritis with rheumatoid factor of multiple sites: Secondary | ICD-10-CM | POA: Diagnosis not present

## 2021-11-09 ENCOUNTER — Ambulatory Visit: Payer: Medicare Other | Admitting: Neurology

## 2021-11-23 ENCOUNTER — Telehealth: Payer: Self-pay

## 2021-11-23 DIAGNOSIS — R413 Other amnesia: Secondary | ICD-10-CM

## 2021-11-23 NOTE — Telephone Encounter (Signed)
Patient daughter (DPR) and patient (in background) is requesting referral for patient memory loss. ?Patient is requesting to be referred to: ?[facility] ?[place]. ?Rondall Allegra ? ?Referral and notes need to be faxed to [place] at [phone] ?

## 2021-11-24 NOTE — Telephone Encounter (Signed)
Okay, please order referral as per patient request. ?

## 2021-11-24 NOTE — Telephone Encounter (Signed)
Pt has hx of mild cognitive impairment. Last OV 08/12/21. Last referral placed to Neurology on 10/06/20 [facility] for memory impairment.  ? ?Please review and advise ? ?

## 2021-11-24 NOTE — Telephone Encounter (Signed)
Unsure how to place specific referral. Assistance needed ?

## 2021-11-25 NOTE — Addendum Note (Signed)
Addended by: Tammi Sou on: 11/25/2021 07:29 AM ? ? Modules accepted: Orders ? ?

## 2021-11-25 NOTE — Telephone Encounter (Signed)
Pt advised referral placed 

## 2021-11-25 NOTE — Telephone Encounter (Signed)
Okay I ordered referral ?

## 2021-12-15 NOTE — Patient Outreach (Signed)
[facility]) Care Management ? ?12/15/2021 ? ?Bonnie [name] ?[birth year] ?[id number] ? ? ?Received referral for Care Management from Insurance plan. Assigned patient to Bonnie Quails, Bonnie [name] Care Coordinator for follow up. ? ?Bonnie [name] ?Bonnie [name] ?[phone] ? ?

## 2021-12-28 ENCOUNTER — Other Ambulatory Visit: Payer: Self-pay | Admitting: Family Medicine

## 2021-12-28 ENCOUNTER — Other Ambulatory Visit: Payer: Self-pay | Admitting: *Deleted

## 2021-12-28 NOTE — Patient Outreach (Signed)
[facility]) Care Management ?Telephonic RN Care Manager Note ? ? ?12/28/2021 ?Name:  Bonnie [name] MRN:  [id number] DOB:  [birth year] ? ?Summary: ?[facility] Unsuccessful outreach ?Outreach attempt to the listed at the preferred outreach number in [place] 3123 ?No answer. [facility] RN CM left HIPAA Bonnie [name]) compliant voicemail message along with CM?s contact info.  ? ? ?Subjective: ?Bonnie [name] is an 81 y.o. year old female who is a primary patient of McGowen, Adrian Blackwater, MD. The care management team was consulted for assistance with care management and/or care coordination needs.   ? ?Bonnie [name] was referred to [facility] for Bonnie [name] referral (insurance) screening for needs on 12/15/21. Her primary care provider (PCP) is at [employer] [facility] [place] ?Telephonic RN Care Manager completed Telephone Visit today.  ? ?Objective: ? ?Medications Reviewed Today   ? ? Reviewed by Willette Brace, LPN (Licensed Practical Nurse) on 09/16/21 at 1442  Med List Status: <None>  ? ?Medication Order Taking? Sig Documenting Provider Last Dose Status Informant  ?acetaminophen (TYLENOL) 500 MG tablet [id number] Yes Take 500 mg by mouth every 6 (six) hours as needed for headache (pain). [provider] Taking Active   ?alendronate (FOSAMAX) 70 MG tablet [id number] Yes Take 1 tablet (70 mg total) by mouth once a week. Take with a full glass of water on an empty stomach. Tammi Sou, MD Taking Active   ?aspirin EC 81 MG tablet [id number] Yes Take 81 mg by mouth at bedtime. [provider] Taking Active Child  ?Calcium Citrate-Vitamin D (CALCIUM CITRATE + D PO) [id number] Yes Take 1 tablet by mouth 2 (two) times daily.  [provider] Taking Active Child  ?         ?Med Note Onalee Hua   Fri Aug 04, 2018 12:57 PM)    ?celecoxib (CELEBREX) 200 MG capsule [id number] Yes Take by mouth daily as needed. [provider] Taking Active   ?Coenzyme  Q10 (CO Q 10) 60 MG CAPS [id number] Yes 1 capsule with a meal [provider] Taking Active   ?diphenhydrAMINE (BENADRYL) 25 mg capsule [id number] Yes 1 capsule as needed [provider] Taking Active   ?fexofenadine (ALLEGRA) 180 MG tablet [id number] Yes 1 tablet as needed [provider] Taking Active   ?folic acid (FOLVITE) 1 MG tablet [id number] Yes Take 1 mg by mouth 2 (two) times daily. [provider] Taking Active Child  ?         ?Med Note Onalee Hua   Fri Aug 04, 2018 12:59 PM)    ?isosorbide mononitrate (IMDUR) 60 MG 24 hr tablet [id number] Yes TAKE 1 TABLET BY MOUTH EVERY DAY Minus Breeding, MD Taking Active   ?levothyroxine (SYNTHROID) 88 MCG tablet [id number] Yes Take 1 tablet (88 mcg total) by mouth daily before breakfast. McGowen, Adrian Blackwater, MD Taking Active   ?LORazepam (ATIVAN) 0.5 MG tablet [id number] Yes 1-2 tabs po bid prn anxiety McGowen, Adrian Blackwater, MD Taking Active   ?methotrexate 2.5 MG tablet [id number] Yes Take 12.5 mg by mouth every Sunday.  [provider] Taking Active Child  ?Multiple Vitamin (MULTIVITAMIN WITH MINERALS) TABS tablet [id number] Yes Take 1 tablet by mouth daily. [provider] Taking Active Child  ?nitroGLYCERIN (NITROSTAT) 0.4 MG SL tablet [id number] Yes Place 1 tablet (0.4 mg total) under the tongue every 5 (five) minutes x 3 doses as needed for chest pain. Minus Breeding, MD Taking Active   ?  Omega-3 Fatty Acids (FISH OIL PO) [id number] Yes Take by mouth daily. [provider] Taking Active   ?potassium chloride (KLOR-CON) 8 MEQ tablet [id number] Yes TAKE 2 TABLETS (16 MEQ TOTAL) BY MOUTH DAILY. Minus Breeding, MD Taking Active   ?rosuvastatin (CRESTOR) 40 MG tablet [id number] Yes TAKE 1 TABLET (40 MG TOTAL) BY MOUTH DAILY AT 6 PM. Minus Breeding, MD Taking Active   ?[place] [id number] Yes Take by mouth. [provider] Taking Active   ? ?  ?  ? ?  ? ? ? ?SDOH:  (Social Determinants of  Health) assessments and interventions performed:  ? ? ?Care Plan ? ?Review of patient past medical history, allergies, medications, health status, including review of consultants reports, laboratory and other test data, was performed as part of comprehensive evaluation for care management services.  ? ?There are no care plans that you recently modified to display for this patient. ?  ? ?Plan: The patient has been provided with contact information for the care management team and has been advised to call with any health related questions or concerns.  ?The care management team will reach out to the patient again over the next 30+ business days. ?Unsuccessful letter mailed on 12/28/21 ? ?Akirra Lacerda L. Lavina Hamman, RN, BSN, CCM ?[place] Management Care Coordinator ?Office number [phone] ?[facility] number [phone] ?Fax number [phone] ? ? ? ? ? ? ? ? ?

## 2022-01-01 ENCOUNTER — Telehealth: Payer: Self-pay | Admitting: Cardiology

## 2022-01-01 NOTE — Telephone Encounter (Signed)
Patient c/o Palpitations:  High priority if patient c/o lightheadedness, shortness of breath, or chest pain ? ?How long have you had palpitations/irregular HR/ Afib? Are you having the symptoms now?  ?Patient states she had palpitations on yesterday, 4/21 and 4/20, mainly at night. ?No symptoms currently ? ?Are you currently experiencing lightheadedness, SOB or CP?  ?No  ? ?Do you have a history of afib (atrial fibrillation) or irregular heart rhythm?  ?Patient states she is unsure, but has had similar symptoms in the past ? ?Have you checked your BP or HR? (document readings if available):  ?145/81 highest ?132/80 lowest ? ?Are you experiencing any other symptoms?  ?No  ? ?

## 2022-01-01 NOTE — Telephone Encounter (Signed)
LMTCB. Left message that if she needs help over the weekend, to call and ask for cardiologist on call. ?

## 2022-01-04 NOTE — Telephone Encounter (Signed)
LMTCB. Placed note her in [place]. ?

## 2022-01-04 NOTE — Telephone Encounter (Signed)
Patient stated she does not get dizzy with palpitations, but she stated she "always feel like I'm on a boat. I don't want to fall, but I feel like I'm on a boat." Not having palpitations presently. Appointment made with Dr. Percival Spanish for May 3. ?Also made appointment for her husband Evette Doffing on the same day for 12 month f/u ?

## 2022-01-06 ENCOUNTER — Ambulatory Visit (INDEPENDENT_AMBULATORY_CARE_PROVIDER_SITE_OTHER): Payer: Medicare Other | Admitting: Neurology

## 2022-01-06 VITALS — BP 140/58 | HR 52 | Ht 67.0 in | Wt 131.0 lb

## 2022-01-06 DIAGNOSIS — G3184 Mild cognitive impairment, so stated: Secondary | ICD-10-CM | POA: Insufficient documentation

## 2022-01-06 NOTE — Progress Notes (Signed)
? ? ?SLEEP MEDICINE CLINIC ?  ? ?Provider:  Larey Seat, MD  ?Primary Care Physician:  Tammi Sou, MD ?[address] ?[place] [address]  ? ?  ?Referring Provider: Tammi Sou, Md ?[phone] [place] ?[place],   [id number]  ?  ?  ?    ?Chief Complaint according to patient   ?Patient presents with:  ?  ? New Patient (Initial Visit)  ?     ?  ?  ?HISTORY OF PRESENT ILLNESS: Seen in 2022 by Dr Jannifer Franklin and diagnosed with MCI.   ? ?01-06-2022:  ?Bonnie [name] is a 81 y.o. year old married mother of 42 , a female patient seen here in a transfer of care for ? MCI from Dr Jannifer Franklin.  ?01/06/2022  ? ?Chief concern according to patient :  I feel slower, more tired, my memory seems stable.  ?  ? Bonnie [name]  has a past medical history of Beta thalassemia, carrier ,heterozygous, CAD (coronary artery disease) (81/0175), Diastolic dysfunction (06/2584), Fatigue, GERD (gastroesophageal reflux disease), Hemorrhoids, History of adenomatous polyp of colon, Hyperlipidemia, Hypothyroidism, postsurgical (2012), Memory difficulties (12/25/2020), MGUS (monoclonal gammopathy of unknown significance) (2022), NSTEMI (non-ST elevated myocardial infarction) ([place]) (07/2018), Osteoporosis (01/2021), Palpitations, Rheumatoid arthritis ([place]) on MTX (2007), dizziness-and Tinnitus, bilateral (2021). ?  ?The patient reports a history of stable MCI: She already has made an appointment  the [facility] of aging with a memory clinic. This will take place in 03-25-2022. ?  ? Family medical /sleep history: Father died in second world war- [place] origin,  ?  ?Social history:  R The couple is professed into the order of Steva Colder,  Patient is an Optometrist , retired from  [employer], school aid, and lives in a household with her aphasic spouse ( Stroke survivor) . Marland Kitchen ?Tobacco use former.  quit 50 years ago , ?ETOH use - wine with dinner- ,  ?Caffeine intake in form of Coffee( 2 cups in AM  ) Soda( when eating out) Tea  ( decaff) or energy drinks. ?Regular exercise in form of walking .   ?Hobbies :reading, volunteering.  ?  ?Sleep habits are as follows: The patient's dinner time is between 6-7 PM. The patient goes to bed at 11 PM and continues to sleep for 5-7 hours, wakes for 1 bathroom break, the first time at 4.30 AM- she takes Synthroid at that time.  ?The preferred sleep position is supine, laterally- not prone-, with the support of 1 pillow.  ?Dreams are reportedly frequent/vivid.  ? 8 AM is the usual rise time. The patient wakes up spontaneously.  ? She reports she is feeling refreshed /r restored in AM, with symptoms such as dry mouth, Naps are taken infrequently.  ?Review of Systems: ?Out of a complete 14 system review, the patient complains of only the following symptoms, and all other reviewed systems are negative.:  ?Fatigue,  ? FSS endorsed at na/ 63 points.  ? ?Social History  ? ?Socioeconomic History  ? Marital status: Married  ?  Spouse name: Evette Doffing  ? Number of children: 5  ? Years of education: Not on file  ? Highest education level: Not on file  ?Occupational History  ? Not on file  ?Tobacco Use  ? Smoking status: Former  ?  Packs/day: 0.50  ?  Years: 10.00  ?  Pack years: 5.00  ?  Types: Cigarettes  ?  Quit date: 11/19/1973  ?  Years since quitting: 48.1  ? Smokeless tobacco: Never  ?Vaping Use  ? Vaping Use: Never used  ?Substance and Sexual Activity  ? Alcohol use: Yes  ?  Alcohol/week: 0.0 standard drinks  ?  Comment: very rare glass of wine  ? Drug use: No  ? Sexual activity: Yes  ?Other Topics Concern  ? Not on file  ?Social History Narrative  ? Lives with husband in [place].  ? Right Handed  ? Drinks 4-5 cups caffeine daily  ? ?Social Determinants of Health  ? ?Financial Resource Strain: Low Risk   ? Difficulty of Paying Living Expenses: Not hard at all  ?Food Insecurity: No Food Insecurity  ? Worried About Charity fundraiser in the Last Year: Never true  ? Ran Out of Food in the Last Year: Never true   ?Transportation Needs: No Transportation Needs  ? Lack of Transportation (Medical): No  ? Lack of Transportation (Non-Medical): No  ?Physical Activity: Inactive  ? Days of Exercise per Week: 0 days  ? Minutes of Exercise per Session: 0 min  ?Stress: No Stress Concern Present  ? Feeling of Stress : Only a little  ?Social Connections: Socially Integrated  ? Frequency of Communication with Friends and Family: More than three times a week  ? Frequency of Social Gatherings with Friends and Family: More than three times a week  ? Attends Religious Services: More than 4 times per year  ? Active Member of Clubs or Organizations: Yes  ? Attends Archivist Meetings: 1 to 4 times per year  ? Marital Status: Married  ? ? ?Family History  ?Problem Relation Age of Onset  ? Hypertension Mother   ? CVA Mother   ? Colon cancer Neg Hx   ? Esophageal cancer Neg Hx   ? Stomach cancer Neg Hx   ? Rectal cancer Neg Hx   ? ? ?Past Medical History:  ?Diagnosis Date  ? Beta thalassemia, heterozygous   ? ?alpha? pt does not recall.  ? CAD (coronary artery disease) 07/2018  ? NSTEMI (small vessel occlusion, not amenable to intervention)-->2V CAD on cath, preserved LV systolic fxn.  ? Diastolic dysfunction 84/1324  ? Fatigue   ? GERD (gastroesophageal reflux disease)   ? Hemorrhoids   ? History of adenomatous polyp of colon   ? Hyperlipidemia   ? Hypothyroidism, postsurgical 2012  ? 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  Surgical path NEG for atypica or malignancy.  ? Memory difficulties 12/25/2020  ? MGUS (monoclonal gammopathy of unknown significance) 2022  ? Dr. Marin Olp eval [phone] feels it is likely reactive d/t her RA.  Low suspicion of plasma cell dyscrasia-->f/u 41mo  ? [place]STEMI (non-ST elevated [place]) [place] 07[place]  ? Dr. HPercival Spanish ? Osteoporosis 01/2021  ? 01/2021 T scoree -2.7   ? Palpitations   ? 08/2018 Event monitor normal. Long term event monitor 05/2020 w/out signif arrhythmia--some brief SVT.  ? [place]umatoid arthritis ([place]  ? muscle weaknes at times (Dr. BAmil Amen  ? Tinnitus, bilateral 2021  ? with high frequ SSHL->reassured by Ent  ? ? ?Past Surgical History:  ?Procedure Laterality Date  ? ABDOMINAL HYSTERECTOMY  1987  ? DUB, no malignancy.  Ovaries still in.  ? BLADDER REPAIR    ? Cardiac Event Monitor  08/22/2018  ? NO arrhythmias.  Her symptomatic periods coresponded to NSR.  ?  CHOLECYSTECTOMY  1969  ? COLONOSCOPY  11/07/2017  ? 2014 adenoma.  Rpt 10/2017 adenoma x 1.  No further colonoscopies needed per GI.   ? DEXA  01/2021  ? T score -2.7  ? LEFT HEART CATH AND CORONARY ANGIOGRAPHY N/A 08/08/2018  ? Procedure: LEFT HEART CATH AND CORONARY ANGIOGRAPHY;  Surgeon: Troy Sine, MD;  Location: [facility];  Service: Cardiovascular;  Laterality: N/A;  ? THYROIDECTOMY  2012  ? cytologic atypia on nodule bx; surgical path NEG for atypica or malignancy.  ? TRANSTHORACIC ECHOCARDIOGRAM  08/07/2018  ? EF 60-65%, no wall motion abnl, grd II DD, mod MR.  ? UPPER GASTROINTESTINAL ENDOSCOPY    ? VAGINAL PROLAPSE REPAIR  2012  ?  ? ?Current Outpatient Medications on File Prior to Visit  ?Medication Sig Dispense Refill  ? acetaminophen (TYLENOL) 500 MG tablet Take 500 mg by mouth every 6 (six) hours as needed for headache (pain).    ? alendronate (FOSAMAX) 70 MG tablet Take 1 tablet (70 mg total) by mouth once a week. Take with a full glass of water on an empty stomach. 4 tablet 11  ? aspirin EC 81 MG tablet Take 81 mg by mouth at bedtime.    ? Calcium Citrate-Vitamin D (CALCIUM CITRATE + D PO) Take 1 tablet by mouth 2 (two) times daily.     ? celecoxib (CELEBREX) 200 MG capsule Take by mouth daily as needed.    ? Coenzyme Q10 (CO Q 10) 60 MG CAPS 1 capsule with a meal    ? diphenhydrAMINE (BENADRYL) 25 mg capsule 1 capsule as needed    ? fexofenadine (ALLEGRA) 180 MG tablet 1  tablet as needed    ? folic acid (FOLVITE) 1 MG tablet Take 1 mg by mouth 2 (two) times daily.    ? isosorbide mononitrate (IMDUR) 60 MG 24 hr tablet Take 1 tablet (60 mg total) by mouth daily. 90 tablet

## 2022-01-06 NOTE — Patient Instructions (Signed)
There are well-accepted and sensible ways to reduce risk for Alzheimers disease and other degenerative brain disorders . ? ?Exercise Daily Walk A daily 20 minute walk should be part of your routine. Disease related apathy can be a significant roadblock to exercise and the only way to overcome this is to make it a daily routine and perhaps have a reward at the end (something your loved one loves to eat or drink perhaps) or a personal trainer coming to the home can also be very useful. Most importantly, the patient is much more likely to exercise if the caregiver / spouse does it with him/her. In general a structured, repetitive schedule is best. ? ?General Health: Any diseases which effect your body will effect your brain such as a pneumonia, urinary infection, blood clot, heart attack or stroke. Keep contact with your primary care doctor for regular follow ups. ? ?Sleep. A good nights sleep is healthy for the brain. Seven hours is recommended. If you have insomnia or poor sleep habits we can give you some instructions. If you have sleep apnea wear your mask. ? ?Diet: Eating a heart healthy diet is also a good idea; fish and poultry instead of red meat, nuts (mostly non-peanuts), vegetables, fruits, olive oil or canola oil (instead of butter), minimal salt (use other spices to flavor foods), whole grain rice, bread, cereal and pasta and wine in moderation.Research is now showing that the MIND diet, which is a combination of The Mediterranean diet and the DASH diet, is beneficial for cognitive processing and longevity. Information about this diet can be found in The MIND Diet, a book by Doyne Keel, MS, RDN, and online at [employer]bookDist[employer].si ? ?Finances, Power of Producer, television/film/video Directives: You should consider putting legal safeguards in place with regard to financial and medical decision making. While the spouse always has power of attorney for medical and financial issues in the  absence of any form, you should consider what you want in case the spouse / caregiver is no longer around or capable of making decisions.  ? ?The Alzheimers Association Position on Disease Prevention ? ?Can Alzheimer's be prevented? It's a question that continues to intrigue researchers and fuel new investigations. There are no clear-cut answers yet -- partially due to the need for more large-scale studies in diverse populations -- but promising research is under way. The Alzheimer's Association? is leading the worldwide effort to find a treatment for Alzheimer's, delay its onset and prevent it from developing.  ? ?What causes Alzheimer's? ?Experts agree that in the vast majority of cases, Alzheimer's, like other common chronic conditions, probably develops as a result of complex interactions among multiple factors, including age, genetics, environment, lifestyle and coexisting medical conditions. Although some risk factors -- such as age or genes -- cannot be changed, other risk factors -- such as high blood pressure and lack of exercise -- usually can be changed to help reduce risk. Research in these areas may lead to new ways to detect those at highest risk. ? ?Prevention studies ?A small percentage of people with Alzheimer's disease (less than 1 percent) have an early-onset type associated with genetic mutations. Individuals who have these genetic mutations are guaranteed to develop the disease. An ongoing clinical trial conducted by the Dominantly Inherited Alzheimer Network (DIAN), is testing whether antibodies to beta-amyloid can reduce the accumulation of beta-amyloid plaque in the brains of people with such genetic mutations and thereby reduce, delay or prevent symptoms. Participants in the trial are receiving antibodies (  or placebo) before they develop symptoms, and the development of beta-amyloid plaques is being monitored by brain scans and other tests. ? ?Another clinical trial, known as the A4 trial  (Anti-Amyloid Treatment in Asymptomatic Alzheimer's), is testing whether antibodies to beta-amyloid can reduce the risk of Alzheimer's disease in older people (ages 76 to 2) at high risk for the disease. The A4 trial is being conducted by the Alzheimer's Disease Cooperative Study. ? ?Though research is still evolving, evidence is strong that people can reduce their risk by making key lifestyle changes, including participating in regular activity and maintaining good heart health. Based on this research, the Alzheimer's Association offers 10 Ways to Love Your Brain -- a collection of tips that can reduce the risk of cognitive decline. ? ?Heart-head connection ? ?New research shows there are things we can do to reduce the risk of mild cognitive impairment and dementia. ? ?Several conditions known to increase the risk of cardiovascular disease -- such as high blood pressure, diabetes and high cholesterol -- also increase the risk of developing Alzheimer's. Some autopsy studies show that as many as 68 percent of individuals with Alzheimer's disease also have cardiovascular disease. ? ?A longstanding question is why some people develop hallmark Alzheimer's plaques and tangles but do not develop the symptoms of Alzheimer's. Vascular disease may help researchers eventually find an answer. Some autopsy studies suggest that plaques and tangles may be present in the brain without causing symptoms of cognitive decline unless the brain also shows evidence of vascular disease. More research is needed to better understand the link between vascular health and Alzheimer's. ? ?Physical exercise and diet ?Regular physical exercise may be a beneficial strategy to lower the risk of Alzheimer's and vascular dementia. Exercise may directly benefit brain cells by increasing blood and oxygen flow in the brain. Because of its known cardiovascular benefits, a medically approved exercise program is a valuable part of any overall wellness  plan. ? ?Current evidence suggests that heart-healthy eating may also help protect the brain. Heart-healthy eating includes limiting the intake of sugar and saturated fats and making sure to eat plenty of fruits, vegetables, and whole grains. No one diet is best. Two diets that have been studied and may be beneficial are the DASH (Dietary Approaches to Stop Hypertension) diet and the Mediterranean diet. The DASH diet emphasizes vegetables, fruits and fat-free or low-fat dairy products; includes whole grains, fish, poultry, beans, seeds, nuts and vegetable oils; and limits sodium, sweets, sugary beverages and red meats. A Mediterranean diet includes relatively little red meat and emphasizes whole grains, fruits and vegetables, fish and shellfish, and nuts, olive oil and other healthy fats. ? ?Social connections and intellectual activity ?A number of studies indicate that maintaining strong social connections and keeping mentally active as we age might lower the risk of cognitive decline and Alzheimer's. Experts are not certain about the reason for this association. It may be due to direct mechanisms through which social and mental stimulation strengthen connections between nerve cells in the brain. ? ?Head trauma ?There appears to be a strong link between future risk of Alzheimer's and serious head trauma, especially when injury involves loss of consciousness. You can help reduce your risk of Alzheimer's by protecting your head. ? Wear a seat belt ? Use a helmet when participating in sports ? "Fall-proof" your home ?  ?What you can do now ?While research is not yet conclusive, certain lifestyle choices, such as physical activity and diet, may help support brain  health and prevent Alzheimer's. Many of these lifestyle changes have been shown to lower the risk of other diseases, like heart disease and diabetes, which have been linked to Alzheimer's. With few drawbacks and plenty of known benefits, healthy lifestyle  choices can improve your health and possibly protect your brain. ? ?Learn more about brain health. ?You can help increase our knowledge by considering participation in a clinical study. Our free clinical trial matc

## 2022-01-11 DIAGNOSIS — M0589 Other rheumatoid arthritis with rheumatoid factor of multiple sites: Secondary | ICD-10-CM | POA: Diagnosis not present

## 2022-01-11 DIAGNOSIS — Z6820 Body mass index (BMI) 20.0-20.9, adult: Secondary | ICD-10-CM | POA: Diagnosis not present

## 2022-01-11 DIAGNOSIS — Z79899 Other long term (current) drug therapy: Secondary | ICD-10-CM | POA: Diagnosis not present

## 2022-01-11 DIAGNOSIS — M791 Myalgia, unspecified site: Secondary | ICD-10-CM | POA: Diagnosis not present

## 2022-01-12 NOTE — Progress Notes (Signed)
?  ?Cardiology Office Note ? ? ?Date:  01/13/2022  ? ?ID:  Bonnie [name], DOB [birth year], MRN [id number] ? ?PCP:  Tammi Sou, MD  ?Cardiologist:   Minus Breeding, MD  ? ? ?Chief Complaint  ?Patient presents with  ? Dizziness  ? ? ? ?History of Present Illness: ?Bonnie [name] is a 81 y.o. female who presents for evaluation of chest pain.  She was in the hospital in 08/06/2018 describing heavy pressure midsternal with associated nausea. EKG in ED was abnormal with inverted T-wave in Lead III.    She had cardiac cath on 08/08/2018 revealing 2 vessel disease with 60-65 % in the mLAD, and mRCA with an abrupt cut off in the mid posterior lateral vessel which was a small caliber vessel and suspected to be the culprit vessel with normal L Cx. She did not require intervention but was treated with DAPT with ASA and Brilinta, with aggressive lipid lowering recommended with increased dose of Crestor.  She was noted to have moderate MR and mild AI on echo with normal LV function .   ? ?She returns for follow up.  She has been dizzy.  She called to talk about this and said it feels like she is on a boat.  She is not describing it at the time that she has palpitations.  But she does feel palpitations particularly at night.  She will feel her heart skipping.  She does not have any presyncope or syncope.  She does say that some of the dizziness is a little bit positional.  However, it feels like her head is swimming.  She is not describing any chest pressure, neck or arm discomfort.  She is not describing any new shortness of breath, PND or orthopnea. ? ? ?Past Medical History:  ?Diagnosis Date  ? Beta thalassemia, heterozygous   ? ?alpha? pt does not recall.  ? CAD (coronary artery disease) 07/2018  ? NSTEMI (small vessel occlusion, not amenable to intervention)-->2V CAD on cath, preserved LV systolic fxn.  ? Diastolic dysfunction 54/6270  ? Fatigue   ? GERD (gastroesophageal reflux disease)   ? Hemorrhoids   ? History  of adenomatous polyp of colon   ? Hyperlipidemia   ? Hypothyroidism, postsurgical 2012  ? 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  Surgical path NEG for atypica or malignancy.  ? Memory difficulties 12/25/2020  ? MGUS (monoclonal gammopathy of unknown significance) 2022  ? Dr. Marin Olp eval [phone] feels it is likely reactive d/t her RA.  Low suspicion of plasma cell dyscrasia-->f/u 79mo  ? NSTEMI (non-ST elevated myocardial infarction) ([place] 07/2018  ? Dr. HPercival Spanish ? Osteoporosis 01/2021  ? 01/2021 T scoree -2.7  ? Palpitations   ? 08/2018 Event monitor normal. Long term event monitor 05/2020 w/out signif arrhythmia--some brief SVT.  ? Rheumatoid arthritis ([facility] 2007  ? muscle weaknes at times (Dr. BAmil Amen  ? Tinnitus, bilateral 2021  ? with high frequ SSHL->reassured by Ent  ? ? ?Past Surgical History:  ?Procedure Laterality Date  ? ABDOMINAL HYSTERECTOMY  1987  ? DUB, no malignancy.  Ovaries still in.  ? BLADDER REPAIR    ? Cardiac Event Monitor  08/22/2018  ? NO arrhythmias.  Her symptomatic periods coresponded to NSR.  ? CHOLECYSTECTOMY  1969  ? COLONOSCOPY  11/07/2017  ? 2014 adenoma.  Rpt 10/2017 adenoma x 1.  No further colonoscopies  needed per GI.   ? DEXA  01/2021  ? T score -2.7  ? LEFT HEART CATH AND CORONARY ANGIOGRAPHY N/A 08/08/2018  ? Procedure: LEFT HEART CATH AND CORONARY ANGIOGRAPHY;  Surgeon: Troy Sine, MD;  Location: [facility];  Service: Cardiovascular;  Laterality: N/A;  ? THYROIDECTOMY  2012  ? cytologic atypia on nodule bx; surgical path NEG for atypica or malignancy.  ? TRANSTHORACIC ECHOCARDIOGRAM  08/07/2018  ? EF 60-65%, no wall motion abnl, grd II DD, mod MR.  ? UPPER GASTROINTESTINAL ENDOSCOPY    ? VAGINAL PROLAPSE REPAIR  2012  ? ? ? ?Current Outpatient Medications  ?Medication Sig Dispense Refill  ? acetaminophen (TYLENOL) 500 MG tablet  Take 500 mg by mouth every 6 (six) hours as needed for headache (pain).    ? alendronate (FOSAMAX) 70 MG tablet Take 1 tablet (70 mg total) by mouth once a week. Take with a full glass of water on an empty stomach. 4 tablet 11  ? aspirin EC 81 MG tablet Take 81 mg by mouth at bedtime.    ? Calcium Citrate-Vitamin D (CALCIUM CITRATE + D PO) Take 1 tablet by mouth 2 (two) times daily.     ? celecoxib (CELEBREX) 200 MG capsule Take by mouth daily as needed.    ? Coenzyme Q10 (CO Q 10) 60 MG CAPS 1 capsule with a meal    ? diphenhydrAMINE (BENADRYL) 25 mg capsule 1 capsule as needed    ? fexofenadine (ALLEGRA) 180 MG tablet 1 tablet as needed    ? folic acid (FOLVITE) 1 MG tablet Take 1 mg by mouth 2 (two) times daily.    ? isosorbide mononitrate (IMDUR) 60 MG 24 hr tablet Take 1 tablet (60 mg total) by mouth daily. 90 tablet 2  ? levothyroxine (SYNTHROID) 88 MCG tablet Take 1 tablet (88 mcg total) by mouth daily before breakfast. 90 tablet 1  ? LORazepam (ATIVAN) 0.5 MG tablet 1-2 tabs po bid prn anxiety 60 tablet 1  ? methotrexate 2.5 MG tablet Take 12.5 mg by mouth every Sunday.     ? Multiple Vitamin (MULTIVITAMIN WITH MINERALS) TABS tablet Take 1 tablet by mouth daily.    ? nitroGLYCERIN (NITROSTAT) 0.4 MG SL tablet Place 1 tablet (0.4 mg total) under the tongue every 5 (five) minutes x 3 doses as needed for chest pain. 25 tablet 3  ? Omega-3 Fatty Acids (FISH OIL PO) Take by mouth daily.    ? potassium chloride (KLOR-CON) 8 MEQ tablet TAKE 2 TABLETS (16 MEQ TOTAL) BY MOUTH DAILY. 180 tablet 3  ? rosuvastatin (CRESTOR) 40 MG tablet TAKE 1 TABLET (40 MG TOTAL) BY MOUTH DAILY AT 6 PM. 90 tablet 3  ? ST JOHNS WORT PO Take by mouth.    ? ?No current facility-administered medications for this visit.  ? ? ?Allergies:   Aricept [donepezil], Neosporin [neomycin-bacitracin zn-polymyx], Other, Seasonal ic [cholestatin], Triamcinolone, Atorvastatin, Monascus purpureus went yeast, and Pravastatin  ? ? ?ROS:  Please see the  history of present illness.   Otherwise, review of systems are positive for none.   All other systems are reviewed and negative.  ? ? ?PHYSICAL EXAM: ?VS:  BP 132/64   Pulse (!) 49   Ht '5\' 7"'$  (1.702 m)   Wt 132 lb 3.2 oz (60 kg)   SpO2 97%   BMI 20.71 kg/m?  , BMI Body mass index is 20.71 kg/m?. ?GENERAL:  Well appearing ?NECK:  No jugular venous distention, waveform  within normal limits, carotid upstroke brisk and symmetric, no bruits, no thyromegaly ?LUNGS:  Clear to auscultation bilaterally ?CHEST:  Unremarkable ?HEART:  PMI not displaced or sustained,S1 and S2 within normal limits, no S3, no S4, no clicks, no rubs, no murmurs ?ABD:  Flat, positive bowel sounds normal in frequency in pitch, no bruits, no rebound, no guarding, no midline pulsatile mass, no hepatomegaly, no splenomegaly ?EXT:  2 plus pulses throughout, no edema, no cyanosis no clubbing ? ?EKG:  EKG is  ordered today. ?Sinus rhythm, rate 49, axis within normal limits, intervals within normal limits, nonspecific diffuse T wave flattening. ? ?Recent Labs: ?08/12/2021: TSH 1.74 ?08/27/2021: ALT 12; BUN 18; Creatinine 0.81; Hemoglobin 12.5; Platelet Count 244; Potassium 4.3; Sodium 143  ? ? ?Lipid Panel ?   ?Component Value Date/Time  ? CHOL 138 08/12/2021 0845  ? TRIG 56.0 08/12/2021 0845  ? HDL 75.50 08/12/2021 0845  ? CHOLHDL 2 08/12/2021 0845  ? VLDL 11.2 08/12/2021 0845  ? [place] 51 08/12/2021 0845  ? [place] 52 10/12/2019 1357  ? LDLDIRECT 155.5 12/04/2012 1123  ? ?  ?Lab Results  ?Component Value Date  ? TSH 1.74 08/12/2021  ? ? ? ?Wt Readings from Last 3 Encounters:  ?01/13/22 132 lb 3.2 oz (60 kg)  ?01/06/22 131 lb (59.4 kg)  ?09/16/21 134 lb 6.4 oz (61 kg)  ?  ? ? ?Other studies Reviewed: ?Additional studies/ records that were reviewed today include: Labs ?Review of the above records demonstrates:    See elsewhere. ? ? ?ASSESSMENT AND PLAN: ? ?CAD:   The patient has no new sypmtoms.  No further cardiovascular testing is indicated.  We  will continue with aggressive risk reduction and meds as listed. ? ?MR:  This was moderate on echo in 2021.  I will check an echocardiogram for follow-up.  ? ?AI:  This was previously mild and I will follow this as

## 2022-01-13 ENCOUNTER — Ambulatory Visit (INDEPENDENT_AMBULATORY_CARE_PROVIDER_SITE_OTHER): Payer: Medicare Other | Admitting: Cardiology

## 2022-01-13 ENCOUNTER — Encounter: Payer: Self-pay | Admitting: Cardiology

## 2022-01-13 VITALS — BP 132/64 | HR 49 | Ht 67.0 in | Wt 132.2 lb

## 2022-01-13 DIAGNOSIS — I351 Nonrheumatic aortic (valve) insufficiency: Secondary | ICD-10-CM

## 2022-01-13 DIAGNOSIS — R002 Palpitations: Secondary | ICD-10-CM | POA: Diagnosis not present

## 2022-01-13 DIAGNOSIS — I2510 Atherosclerotic heart disease of native coronary artery without angina pectoris: Secondary | ICD-10-CM | POA: Diagnosis not present

## 2022-01-13 DIAGNOSIS — E785 Hyperlipidemia, unspecified: Secondary | ICD-10-CM

## 2022-01-13 DIAGNOSIS — I340 Nonrheumatic mitral (valve) insufficiency: Secondary | ICD-10-CM | POA: Diagnosis not present

## 2022-01-13 NOTE — Patient Instructions (Signed)
Medication Instructions:  ?Your physician recommends that you continue on your current medications as directed. Please refer to the Current Medication list given to you today. ? ?*If you need a refill on your cardiac medications before your next appointment, please call your pharmacy* ? ?Testing/Procedures: ?Your physician has requested that you have an echocardiogram. Echocardiography is a painless test that uses sound waves to create images of your heart. It provides your doctor with information about the size and shape of your heart and how well your heart?s chambers and valves are working. This procedure takes approximately one hour. There are no restrictions for this procedure. ?This will be done at our [facility] location:  [address] ? ?Follow-Up: ?At [facility], you and your health needs are our priority.  As part of our continuing mission to provide you with exceptional heart care, we have created designated Provider Care Teams.  These Care Teams include your primary Cardiologist (physician) and Advanced Practice Providers (APPs -  Physician Assistants and Nurse Practitioners) who all work together to provide you with the care you need, when you need it. ? ?We recommend signing up for the patient portal called "MyChart".  Sign up information is provided on this After Visit Summary.  MyChart is used to connect with patients for Virtual Visits (Telemedicine).  Patients are able to view lab/test results, encounter notes, upcoming appointments, etc.  Non-urgent messages can be sent to your provider as well.   ?To learn more about what you can do with MyChart, go to [email or web address].   ? ?Your next appointment:   ?12 month(s) ? ?The format for your next appointment:   ?In Person ? ?Provider:   ?Minus Breeding, MD { ? ? ? ?Important Information About Sugar ? ? ? ? ? ? ?

## 2022-01-18 ENCOUNTER — Encounter: Payer: Self-pay | Admitting: *Deleted

## 2022-01-18 ENCOUNTER — Other Ambulatory Visit: Payer: Self-pay | Admitting: *Deleted

## 2022-01-18 NOTE — Patient Outreach (Signed)
[facility]) Care Management ?Telephonic RN Care Manager Note ? ? ?01/18/2022 ?Name:  Bonnie [name] MRN:  [id number] DOB:  1941/03/04 ? ?Summary: ?spoke with pt and husband Bonnie [name]- HIPPA) ?Very Pleasant couple  ?Bonnie [name] reports she is doing well at this time except voices concern about her heart palpitations, tinnitus, ear ringing, dizziness that continues even after her recent appointment  ?She reports she has had these symptoms for a while and describes it "like being on a boat"  ?Positive for a fall history x 2 with a concussion -09/07/18 fall with head with right frontal scalp/laceration [place]  [place] (? 2017) ?Denies hearing loss, fullness/congestion in the ears, no nausea,  no blurred vision  ?No ophthalmology visit in awhile.- need new glasses  ?States she may experience symptoms while sitting and watching movies with spouse ?She denies having siblings (has no brother or sisters) with cardiac/atrial fibrillation  ?She was seen by Dr Percival Spanish on 01/13/22  ?On 01/19/22 she will receive cardiac imaging  ? ? ? ?Home management ?She monitors her BP & heart rate at home ?Reports generally her BP high is around 140/82 and the lowest value she is aware of is 104/78  ?During April 2023 values were 125/75 52  to 145/81 54 ?Has not been seen by a nutritionist and states she is not sure if she needs to be on a special diet, She does confirms she recalls and attempts to decrease her sodium and fat in meals  ?She confirms she has not reviewed her medications with her external vendor pharmacy staff (pcp) ?Went to ENT after a loss of taste and smell with loss weight over the last "ten years"  ? ?Tobacco use former.  quit 50 years ago , ?ETOH use - wine with dinner- ,  ?Caffeine intake in form of Coffee( 2 cups in AM  ) Soda( when eating out) Tea ( decaff) or energy drinks. ?Regular exercise in form of walking .   ?Hobbies :reading, volunteering.  ?  ?  ?  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 ?Recommendations/Changes made from today's visit: ?Initial assessment started ?Assessed for worsening symptoms of dizziness ?Discussed hypotension (100-110/60-70) vs hypertension- risk for falls + ?Discussed reasons for dizziness to include medicines, changes in blood vessels (hardening, clogged, damaged), hypotension, quick movements -sitting to standing, dehydration ?Discussed [facility] vest[facility] therapy ?Explained imaging on 01/19/22 is for cardiac ?Discussed CT imaging - answered questions ?Answered questions about [facility] vs [facility] services  ? ? ?Subjective: ?Bonnie [name] is an 81 y.o. year old female who is a primary patient of McGowen, Adrian Blackwater, MD. The care management team was consulted for assistance with care management and/or care coordination needs.   ? ?Telephonic RN Care Manager completed Telephone Visit today.  ? ?Objective: ? ?Medications Reviewed Today   ? ? Reviewed by Barbaraann Faster, RN (Registered Nurse) on 01/18/22 at 1631  Med List Status: <None>  ? ?Medication Order Taking? Sig Documenting Provider Last Dose Status Informant  ?acetaminophen (TYLENOL) 500 MG tablet [id number]  Take 500 mg by mouth every 6 (six) hours as needed for headache (pain). [provider]  Active   ?alendronate (FOSAMAX) 70 MG tablet [id number]  Take 1 tablet (70 mg total) by mouth once a week. Take with a full glass of water on an empty stomach. McGowen, Adrian Blackwater, MD  Active   ?  aspirin EC 81 MG tablet [id number]  Take 81 mg by mouth at bedtime. [provider]  Active Child  ?Calcium Citrate-Vitamin D (CALCIUM CITRATE + D PO) [id number]  Take 1 tablet by mouth 2 (two) times daily.  [provider]  Active Child  ?         ?Med Note Onalee Hua   Fri Aug 04, 2018 12:57 PM)    ?celecoxib (CELEBREX) 200 MG capsule [id number]  Take by mouth daily as needed. [provider]  Active   ?Coenzyme Q10  (CO Q 10) 60 MG CAPS [id number]  1 capsule with a meal [provider]  Active   ?diphenhydrAMINE (BENADRYL) 25 mg capsule [id number]  1 capsule as needed [provider]  Active   ?fexofenadine (ALLEGRA) 180 MG tablet [id number]  1 tablet as needed [provider]  Active   ?folic acid (FOLVITE) 1 MG tablet [id number]  Take 1 mg by mouth 2 (two) times daily. [provider]  Active Child  ?         ?Med Note Onalee Hua   Fri Aug 04, 2018 12:59 PM)    ?isosorbide mononitrate (IMDUR) 60 MG 24 hr tablet [id number]  Take 1 tablet (60 mg total) by mouth daily. Minus Breeding, MD  Active   ?levothyroxine (SYNTHROID) 88 MCG tablet [id number]  Take 1 tablet (88 mcg total) by mouth daily before breakfast. McGowen, Adrian Blackwater, MD  Active   ?LORazepam (ATIVAN) 0.5 MG tablet [id number]  1-2 tabs po bid prn anxiety McGowen, Adrian Blackwater, MD  Active   ?methotrexate 2.5 MG tablet [id number]  Take 12.5 mg by mouth every Sunday.  [provider]  Active Child  ?Multiple Vitamin (MULTIVITAMIN WITH MINERALS) TABS tablet [id number]  Take 1 tablet by mouth daily. [provider]  Active Child  ?nitroGLYCERIN (NITROSTAT) 0.4 MG SL tablet [id number]  Place 1 tablet (0.4 mg total) under the tongue every 5 (five) minutes x 3 doses as needed for chest pain. Minus Breeding, MD  Active   ?Omega-3 Fatty Acids (FISH OIL PO) [id number]  Take by mouth daily. [provider]  Active   ?potassium chloride (KLOR-CON) 8 MEQ tablet [id number]  TAKE 2 TABLETS (16 MEQ TOTAL) BY MOUTH DAILY. Minus Breeding, MD  Active   ?rosuvastatin (CRESTOR) 40 MG tablet [id number]  TAKE 1 TABLET (40 MG TOTAL) BY MOUTH DAILY AT 6 PM. Minus Breeding, MD  Active   ?ST JOHNS WORT PO [id number]  Take by mouth. [provider]  Active   ? ?  ?  ? ?  ? ?Past Medical History:  ?Diagnosis Date  ? Beta thalassemia, heterozygous   ? ?alpha? pt does not recall.  ? CAD (coronary artery disease) 07/2018  ? NSTEMI  (small vessel occlusion, not amenable to intervention)-->2V CAD on cath, preserved LV systolic fxn.  ? Diastolic dysfunction 93/8182  ? Fatigue   ? GERD (gastroesophageal reflux disease)   ? Hemorrhoids   ? History of adenomatous polyp of colon   ? Hyperlipidemia   ? Hypothyroidism, postsurgical 2012  ? 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  Surgical path NEG for atypica or malignancy.  ? Memory difficulties 12/25/2020  ? MGUS (monoclonal gammopathy of unknown significance) 2022  ? Dr. Marin Olp eval [phone] feels it is likely reactive d/t her RA.  Low suspicion of plasma cell dyscrasia-->f/u 77mo  ?  NSTEMI (non-ST elevated myocardial infarction) ([place]) 07/2018  ? Dr. Percival Spanish  ? Osteoporosis 01/2021  ? 01/2021 T scoree -2.7  ? Palpitations   ? 08/2018 Event monitor normal. Long term event monitor 05/2020 w/out signif arrhythmia--some brief SVT.  ? Rheumatoid arthritis ([place]) 2007  ? muscle weaknes at times (Dr. Amil Amen)  ? Tinnitus, bilateral 2021  ? with high frequ SSHL->reassured by Ent  ? ? ? ?SDOH:  (Social Determinants of Health) assessments and interventions performed:  ?SDOH Interventions   ? ?Flowsheet Row Most Recent Value  ?SDOH Interventions   ?Food Insecurity Interventions Intervention Not Indicated  ?Financial Strain Interventions Intervention Not Indicated  ?Housing Interventions Intervention Not Indicated  ?Intimate Partner Violence Interventions Intervention Not Indicated  ?Stress Interventions Intervention Not Indicated  ?Social Connections Interventions Intervention Not Indicated  ?Transportation Interventions Intervention Not Indicated  ? ?  ? ? ?Care Plan ? ?Review of patient past medical history, allergies, medications, health status, including review of consultants reports, laboratory and other test data, was performed as part of comprehensive evaluation for care  management services.  ? ?Care Plan : RN Care Manager Plan of Care  ?Updates made by Barbaraann Faster, RN since 01/18/2022 12:00 AM  ?  ? ?Problem: Complex Care Coordination Needs and disease management in p

## 2022-01-19 ENCOUNTER — Ambulatory Visit (INDEPENDENT_AMBULATORY_CARE_PROVIDER_SITE_OTHER): Payer: Medicare Other

## 2022-01-19 DIAGNOSIS — I340 Nonrheumatic mitral (valve) insufficiency: Secondary | ICD-10-CM | POA: Diagnosis not present

## 2022-01-20 LAB — ECHOCARDIOGRAM COMPLETE
AR max vel: 2.24 cm2
AV Area VTI: 2.31 cm2
AV Area mean vel: 2.36 cm2
AV Mean grad: 3 mmHg
AV Peak grad: 6.7 mmHg
AV Vena cont: 0.28 cm
Ao pk vel: 1.29 m/s
Area-P 1/2: 3 cm2
Calc EF: 62.1 %
P 1/2 time: 680 msec
S' Lateral: 2.46 cm
Single Plane A2C EF: 57.9 %
Single Plane A4C EF: 65.9 %

## 2022-01-22 ENCOUNTER — Encounter: Payer: Self-pay | Admitting: Family Medicine

## 2022-01-27 ENCOUNTER — Other Ambulatory Visit: Payer: Self-pay | Admitting: *Deleted

## 2022-01-27 NOTE — Patient Outreach (Signed)
[facility]) Care Management ?Telephonic RN Care Manager Note ? ? ?01/27/2022 ?Name:  Bonnie [name] MRN:  [id number] DOB:  12-17-1940 ? ?Summary: ?A message was left by pt on 01/22/22 0411 with heart palpitations, sweating that resolved ?[facility] Unsuccessful outreach ?Outreach attempt to the listed at the preferred outreach number in [place]  ?No answer. [facility] RN CM left HIPAA [facility] Portability and Accountability Act) compliant voicemail message along with CM?s contact info.  ? ? ?Subjective: ?Bonnie [name] is an 81 y.o. year old female who is a primary patient of McGowen, Adrian Blackwater, MD. The care management team was consulted for assistance with care management and/or care coordination needs.   ? ?Telephonic RN Care Manager completed Telephone Visit today.  ? ?Objective: ? ?Medications Reviewed Today   ? ? Reviewed by Barbaraann Faster, RN (Registered Nurse) on 01/18/22 at 1631  Med List Status: <None>  ? ?Medication Order Taking? Sig Documenting Provider Last Dose Status Informant  ?acetaminophen (TYLENOL) 500 MG tablet [id number]  Take 500 mg by mouth every 6 (six) hours as needed for headache (pain). [provider]  Active   ?alendronate (FOSAMAX) 70 MG tablet [id number]  Take 1 tablet (70 mg total) by mouth once a week. Take with a full glass of water on an empty stomach. Tammi Sou, MD  Active   ?aspirin EC 81 MG tablet [id number]  Take 81 mg by mouth at bedtime. [provider]  Active Child  ?Calcium Citrate-Vitamin D (CALCIUM CITRATE + D PO) [id number]  Take 1 tablet by mouth 2 (two) times daily.  [provider]  Active Child  ?         ?Med Note Onalee Hua   Fri Aug 04, 2018 12:57 PM)    ?celecoxib (CELEBREX) 200 MG capsule [id number]  Take by mouth daily as needed. [provider]  Active   ?Coenzyme Q10 (CO Q 10) 60 MG CAPS [id number]  1 capsule with a meal [provider]  Active   ?diphenhydrAMINE (BENADRYL) 25 mg capsule  [id number]  1 capsule as needed [provider]  Active   ?fexofenadine (ALLEGRA) 180 MG tablet [id number]  1 tablet as needed [provider]  Active   ?folic acid (FOLVITE) 1 MG tablet [id number]  Take 1 mg by mouth 2 (two) times daily. [provider]  Active Child  ?         ?Med Note Onalee Hua   Fri Aug 04, 2018 12:59 PM)    ?isosorbide mononitrate (IMDUR) 60 MG 24 hr tablet [id number]  Take 1 tablet (60 mg total) by mouth daily. Minus Breeding, MD  Active   ?levothyroxine (SYNTHROID) 88 MCG tablet [id number]  Take 1 tablet (88 mcg total) by mouth daily before breakfast. McGowen, Adrian Blackwater, MD  Active   ?LORazepam (ATIVAN) 0.5 MG tablet [id number]  1-2 tabs po bid prn anxiety McGowen, Adrian Blackwater, MD  Active   ?methotrexate 2.5 MG tablet [id number]  Take 12.5 mg by mouth every Sunday.  [provider]  Active Child  ?Multiple Vitamin (MULTIVITAMIN WITH MINERALS) TABS tablet [id number]  Take 1 tablet by mouth daily. [provider]  Active Child  ?nitroGLYCERIN (NITROSTAT) 0.4 MG SL tablet [id number]  Place 1 tablet (0.4 mg total) under the tongue every 5 (five) minutes x 3 doses as needed for chest pain. Minus Breeding, MD  Active   ?Omega-3 Fatty Acids (FISH OIL PO) [id number]  Take by mouth daily.  [provider]  Active   ?potassium chloride (KLOR-CON) 8 MEQ tablet [id number]  TAKE 2 TABLETS (16 MEQ TOTAL) BY MOUTH DAILY. Minus Breeding, MD  Active   ?rosuvastatin (CRESTOR) 40 MG tablet [id number]  TAKE 1 TABLET (40 MG TOTAL) BY MOUTH DAILY AT 6 PM. Minus Breeding, MD  Active   ?ST JOHNS WORT PO [id number]  Take by mouth. [provider]  Active   ? ?  ?  ? ?  ? ? ? ?SDOH:  (Social Determinants of Health) assessments and interventions performed:  ? ? ?Care Plan ? ?Review of patient past medical history, allergies, medications, health status, including review of consultants reports, laboratory and other test data, was performed as part of  comprehensive evaluation for care management services.  ? ?There are no care plans that you recently modified to display for this patient. ?  ? ?Plan: The patient has been provided with contact information for the care management team and has been advised to call with any health related questions or concerns.  ?The care management team will reach out to the patient again over the next 30+ business days. ? ?Antonis Lor L. Lavina Hamman, RN, BSN, CCM ?[facility] Management Care Coordinator ?Office number [phone] ?[facility] number [phone] ?Fax number [phone] ? ? ? ? ? ? ? ? ?

## 2022-02-04 ENCOUNTER — Other Ambulatory Visit: Payer: Self-pay | Admitting: Family Medicine

## 2022-02-04 NOTE — Telephone Encounter (Signed)
Pt should have enough until appt on 5/31. Last refill 12/9(90,1)

## 2022-02-10 ENCOUNTER — Ambulatory Visit (INDEPENDENT_AMBULATORY_CARE_PROVIDER_SITE_OTHER): Payer: Medicare Other | Admitting: Family Medicine

## 2022-02-10 ENCOUNTER — Encounter: Payer: Self-pay | Admitting: Family Medicine

## 2022-02-10 ENCOUNTER — Telehealth: Payer: Self-pay

## 2022-02-10 VITALS — BP 125/67 | HR 56 | Temp 97.6°F | Ht 67.0 in | Wt 132.2 lb

## 2022-02-10 DIAGNOSIS — E7800 Pure hypercholesterolemia, unspecified: Secondary | ICD-10-CM | POA: Diagnosis not present

## 2022-02-10 DIAGNOSIS — I2510 Atherosclerotic heart disease of native coronary artery without angina pectoris: Secondary | ICD-10-CM

## 2022-02-10 DIAGNOSIS — Z79899 Other long term (current) drug therapy: Secondary | ICD-10-CM | POA: Diagnosis not present

## 2022-02-10 DIAGNOSIS — E2839 Other primary ovarian failure: Secondary | ICD-10-CM

## 2022-02-10 DIAGNOSIS — M810 Age-related osteoporosis without current pathological fracture: Secondary | ICD-10-CM

## 2022-02-10 DIAGNOSIS — E039 Hypothyroidism, unspecified: Secondary | ICD-10-CM

## 2022-02-10 LAB — CBC WITH DIFFERENTIAL/PLATELET
Basophils Absolute: 0.1 10*3/uL (ref 0.0–0.1)
Basophils Relative: 2.5 % (ref 0.0–3.0)
Eosinophils Absolute: 0.1 10*3/uL (ref 0.0–0.7)
Eosinophils Relative: 2.5 % (ref 0.0–5.0)
HCT: 36.7 % (ref 36.0–46.0)
Hemoglobin: 12.3 g/dL (ref 12.0–15.0)
Lymphocytes Relative: 25.4 % (ref 12.0–46.0)
Lymphs Abs: 1.2 10*3/uL (ref 0.7–4.0)
MCHC: 33.5 g/dL (ref 30.0–36.0)
MCV: 94.5 fl (ref 78.0–100.0)
Monocytes Absolute: 0.3 10*3/uL (ref 0.1–1.0)
Monocytes Relative: 5.9 % (ref 3.0–12.0)
Neutro Abs: 3 10*3/uL (ref 1.4–7.7)
Neutrophils Relative %: 63.7 % (ref 43.0–77.0)
Platelets: 229 10*3/uL (ref 150.0–400.0)
RBC: 3.88 Mil/uL (ref 3.87–5.11)
RDW: 14.1 % (ref 11.5–15.5)
WBC: 4.7 10*3/uL (ref 4.0–10.5)

## 2022-02-10 LAB — VITAMIN D 25 HYDROXY (VIT D DEFICIENCY, FRACTURES): VITD: 56.84 ng/mL (ref 30.00–100.00)

## 2022-02-10 LAB — COMPREHENSIVE METABOLIC PANEL
ALT: 11 U/L (ref 0–35)
AST: 18 U/L (ref 0–37)
Albumin: 3.8 g/dL (ref 3.5–5.2)
Alkaline Phosphatase: 35 U/L — ABNORMAL LOW (ref 39–117)
BUN: 16 mg/dL (ref 6–23)
CO2: 28 mEq/L (ref 19–32)
Calcium: 9.2 mg/dL (ref 8.4–10.5)
Chloride: 105 mEq/L (ref 96–112)
Creatinine, Ser: 0.9 mg/dL (ref 0.40–1.20)
GFR: 60.72 mL/min (ref >60.00)
Glucose, Bld: 82 mg/dL (ref 70–99)
Potassium: 4 mEq/L (ref 3.5–5.1)
Sodium: 141 mEq/L (ref 135–145)
Total Bilirubin: 0.5 mg/dL (ref 0.2–1.2)
Total Protein: 6.4 g/dL (ref 6.0–8.3)

## 2022-02-10 LAB — LIPID PANEL
Cholesterol: 143 mg/dL (ref 0–200)
HDL: 71.9 mg/dL (ref >39.00)
LDL Cholesterol: 59 mg/dL (ref 0–99)
NonHDL: 70.64
Total CHOL/HDL Ratio: 2
Triglycerides: 57 mg/dL (ref 0.0–149.0)
VLDL: 11.4 mg/dL (ref 0.0–40.0)

## 2022-02-10 LAB — TSH: TSH: 0.78 u[IU]/mL (ref 0.35–5.50)

## 2022-02-10 MED ORDER — LEVOTHYROXINE SODIUM 88 MCG PO TABS: 88.0000 ug | ORAL_TABLET | Freq: Every day | ORAL | 1 refills | Status: DC

## 2022-02-10 NOTE — Progress Notes (Signed)
OFFICE VISIT  02/10/2022  CC: f/u anx/lipids,hypoth,osteoporosis  Patient is a 81 y.o. female who presents accompanied by her husband Lyndle Herrlich for 6 mo f/u GAD, HLD, Hypothyroidism, and osteoporosis A/P as of last visit: "1) hyperlipidemia, doing well on rosuvastatin.  Goal LDL less than 70.  LDL was 59 in July 2021.  We will check lipid panel and hepatic panel today.  2.  Osteoporosis.  Tolerating alendronate well. Continue calcium vitamin D and alendronate.  Next DEXA to be around May 2023.   3. Hypothyroidism, postoperative. Hypoth: Takes T4 on empty stomach w/out any other meds. TSH today.   4) MCI, short term memory loss.  Seems pretty stable.  Did not tolerate Aricept.  Declined further trial of other medication and her neurologist, Dr. Jannifer Franklin, retired.  She wants to take the approach of watchful waiting at this time.  Of note Dr. 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   #5 anxiety.  She is doing well with meditation.  No lorazepam has been needed.  She is comforted by knowing she has this to take if needed.  No new prescription needed today.   6. Preventative health care: Vaccines: Prevnar 20->given today.  Otherwise ALL UTD. No further colon or cervical cancer screening indicated. Breast ca screening->last one in EMR is 04/2020->ordered mammogram today. If this one is normal then pt doesn't want any further breast cancer screening. Next DEXA due around 01/2022 (by GYN or myself)."  INTERIM HX: Doing okay, this still quite anxious about her overall health, particularly her family is concerned about her short-term memory problems.  She still fully INdependent in all ADLs. She feels somewhat down more about this lately.  Nothing severe.  No hopelessness.  She has a little bit of anhedonia.  Sleep and appetite are fine.  She reports feeling "swimmy headed" for the last few months intermittently.  Notes it often when walking around very busily in  her house.  Sometimes notes that simply upon standing.  Describes it as a mild "seasick" feeling, without nausea or vertigo.  She does not veer to one side or the other.  She does feel some palpitations intermittently but does not necessarily clearly relate these with her dizzy spells. No headaches, no vision changes. She does have chronic tinnitus bilateral no acute hearing changes. She does not drink fluids well.  Feels like her TMJ clicks frequently, occasional brief pain when it does so but no chronic/lasting pain. Hands and feet have been feeling cold for quite some time.  They do not have skin color changes.  Past Medical History:  Diagnosis Date   Beta thalassemia, heterozygous    ?alpha? pt does not recall.   CAD (coronary artery disease) 07/2018   NSTEMI (small vessel occlusion, not amenable to intervention)-->2V CAD on cath, preserved LV systolic fxn.   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  Surgical path NEG for atypica or malignancy.   Memory difficulties 12/25/2020   MGUS (monoclonal gammopathy of unknown significance) 2022   Dr. Marin Olp eval 02/2021-->he feels it is likely reactive d/t her RA.  Low suspicion of plasma cell dyscrasia-->f/u 36mo   NSTEMI (  non-ST elevated myocardial infarction) ([place]) 07/2018   Dr. Percival Spanish   Osteoporosis 01/2021   01/2021 T scoree -2.7   Palpitations    08/2018 Event monitor normal. Long term event monitor 05/2020 w/out signif arrhythmia--some brief SVT.   Rheumatoid arthritis ([place]) 2007   muscle weaknes at times (Dr. Amil Amen)   Tinnitus, bilateral 2021   with high frequ SSHL->reassured by Ent    Past Surgical  History:  Procedure Laterality Date   ABDOMINAL HYSTERECTOMY  1987   DUB, no malignancy.  Ovaries still in.   BLADDER REPAIR     Cardiac Event Monitor  08/22/2018   NO arrhythmias.  Her symptomatic periods coresponded to NSR.   CHOLECYSTECTOMY  1969   COLONOSCOPY  11/07/2017   2014 adenoma.  Rpt 10/2017 adenoma x 1.  No further colonoscopies needed per GI.    DEXA  01/2021   T score -2.7   LEFT HEART CATH AND CORONARY ANGIOGRAPHY N/A 08/08/2018   Procedure: LEFT HEART CATH AND CORONARY ANGIOGRAPHY;  Surgeon: Troy Sine, MD;  Location: [facility];  Service: Cardiovascular;  Laterality: N/A;   THYROIDECTOMY  2012   cytologic atypia on nodule bx; surgical path NEG for atypica or malignancy.   TRANSTHORACIC ECHOCARDIOGRAM  08/07/2018   2019 EF 60-65%, no wall motion abnl, grd II DD, mod MR.  01/2022 mild AI and MR   UPPER GASTROINTESTINAL ENDOSCOPY     VAGINAL PROLAPSE REPAIR  2012    Outpatient Medications Prior to Visit  Medication Sig Dispense Refill   acetaminophen (TYLENOL) 500 MG tablet Take 500 mg by mouth every 6 (six) hours as needed for headache (pain).     alendronate (FOSAMAX) 70 MG tablet Take 1 tablet (70 mg total) by mouth once a week. Take with a full glass of water on an empty stomach. 4 tablet 11   aspirin EC 81 MG tablet Take 81 mg by mouth at bedtime.     Calcium Citrate-Vitamin D (CALCIUM CITRATE + D PO) Take 1 tablet by mouth 2 (two) times daily.      celecoxib (CELEBREX) 200 MG capsule Take by mouth daily as needed.     Coenzyme Q10 (CO Q 10) 60 MG CAPS 1 capsule with a meal     diphenhydrAMINE (BENADRYL) 25 mg capsule 1 capsule as needed     fexofenadine (ALLEGRA) 180 MG tablet 1 tablet as needed     folic acid (FOLVITE) 1 MG tablet Take 1 mg by mouth 2 (two) times daily.     isosorbide mononitrate (IMDUR) 60 MG 24 hr tablet Take 1 tablet (60 mg total) by mouth daily. 90 tablet 2   LORazepam (ATIVAN) 0.5 MG tablet 1-2 tabs po bid prn anxiety 60 tablet  1   methotrexate 2.5 MG tablet Take 12.5 mg by mouth every Sunday.      Multiple Vitamin (MULTIVITAMIN WITH MINERALS) TABS tablet Take 1 tablet by mouth daily.     Omega-3 Fatty Acids (FISH OIL PO) Take by mouth daily.     potassium chloride (KLOR-CON) 8 MEQ tablet TAKE 2 TABLETS (16 MEQ TOTAL) BY MOUTH DAILY. 180 tablet 3   rosuvastatin (CRESTOR) 40 MG tablet TAKE 1 TABLET (40 MG TOTAL) BY MOUTH DAILY AT 6 PM. 90 tablet 3   nitroGLYCERIN (NITROSTAT) 0.4 MG SL tablet Place 1 tablet (0.4 mg total) under the tongue every 5 (five) minutes x 3 doses as needed for chest pain. (Patient not taking: Reported on 02/10/2022) 25 tablet 3  levothyroxine (SYNTHROID) 88 MCG tablet Take 1 tablet (88 mcg total) by mouth daily before breakfast. 90 tablet 1   [facility] [place] WORT PO Take by mouth. (Patient not taking: Reported on 02/10/2022)     No facility-administered medications prior to visit.    Allergies  Allergen Reactions   Aricept [Donepezil]     Vivid dreams   Bacitracin-Polymyxin B     Other reaction(s): Unknown   Neosporin [Neomycin-Bacitracin Zn-Polymyx] Other (See Comments)    Patient states that wounds or scratches never heal. (pt uses bacitracin)    Niaspan [Niacin]     Other reaction(s): Myal;gia   Other     Other reaction(s): Unknown   Pitavastatin     Other reaction(s): Myalgia   Seasonal Ic [Cholestatin] Other (See Comments)    Leg cramps   Triamcinolone Other (See Comments)    Burning sensation   Triamcinolone Acetonide     Other reaction(s): Unknown   Atorvastatin Other (See Comments)    Leg cramps Other reaction(s): Myalgia   Monascus Purpureus Went Yeast Other (See Comments)    "Red Yeast Rice" causes leg cramps Other reaction(s): Myalgia   Pravastatin Other (See Comments)    Leg cramps    ROS As per HPI  PE:    02/10/2022    9:03 AM 01/13/2022   11:18 AM 01/06/2022    8:11 AM  Vitals with BMI  Height '5\' 7"'$  '5\' 7"'$  '5\' 7"'$   Weight 132 lbs 3 oz 132 lbs 3 oz 131 lbs  BMI  20.7 60.1 09.32  Systolic 355 732 202  Diastolic 67 64 58  Pulse 56 49 52   Physical Exam  Orthostatics: Supine blood pressure 120/70, heart rate 100.  Upright blood pressure 110/70, heart rate 53.  Standing blood pressure 118/68, heart rate 55. RKY:HCWC: no injection, icteris, swelling, or exudate.  EOMI, PERRLA. Mouth: lips without lesion/swelling.  Oral mucosa pink and moist. Oropharynx without erythema, exudate, or swelling.  No TMJ tenderness.  She does have laxity/partial subluxation of bilat TMJs CV: RRR, no m/r/g.   LUNGS: CTA bilat, nonlabored resps, good aeration in all lung fields. EXT: no clubbing or cyanosis.  no edema.  Extremities warm, no cyanosis or pallor.  LABS:  Last CBC Lab Results  Component Value Date   WBC 5.1 08/27/2021   HGB 12.5 08/27/2021   HCT 38.8 08/27/2021   MCV 98.0 08/27/2021   MCH 31.6 08/27/2021   RDW 14.6 08/27/2021   PLT 244 37/62/8315   Last metabolic panel Lab Results  Component Value Date   GLUCOSE 93 08/27/2021   NA 143 08/27/2021   K 4.3 08/27/2021   CL 107 08/27/2021   CO2 30 08/27/2021   BUN 18 08/27/2021   CREATININE 0.81 08/27/2021   GFRNONAA >60 08/27/2021   CALCIUM 9.5 08/27/2021   PROT 7.0 08/27/2021   ALBUMIN 4.0 08/27/2021   LABGLOB 3.1 08/27/2021   AGRATIO 1.2 08/27/2021   BILITOT 0.5 08/27/2021   ALKPHOS 31 (L) 08/27/2021   AST 20 08/27/2021   ALT 12 08/27/2021   ANIONGAP 6 08/27/2021   Last lipids Lab Results  Component Value Date   CHOL 138 08/12/2021   HDL 75.50 08/12/2021   LDLCALC 51 08/12/2021   LDLDIRECT 155.5 12/04/2012   TRIG 56.0 08/12/2021   CHOLHDL 2 08/12/2021   Last hemoglobin A1c Lab Results  Component Value Date   HGBA1C 5.7 (H) 08/07/2018   Last thyroid functions Lab Results  Component Value Date   TSH  1.74 08/12/2021   IMPRESSION AND PLAN:  #1 mild cognitive impairment. Stable. She had neuro follow-up 01/06/2022, no new work-up or changes made. She has been intolerant of  Aricept.  She has declined any additional medication. She has evaluation with [facility] memory center on 03/25/2022.  2.  Generalized anxiety with recent adjustment disorder with mixed anxious/depressed mood. I do not think she is clinically depressed. Reassurance given today regarding her overall health. She has lorazepam to use as needed but rarely uses it.  #3 dizziness, nonspecific. Orthostatic hypotension may be playing a role, although orthostatic measurements today did not support this. She has longstanding resting sinus bradycardia. Emphasized importance of drinking at least 65 ounces of fluids a day. Behavioral adjustment--weight briefly upon standing for walking.  Sit and rest if feeling dizzy. Of note, MRI of brain without contrast 12/29/2020 showed mild to moderate generalized cortical atrophy and some chronic microvascular ischemic changes.  #4 hypothyroidism. Hypoth: Takes T4 on empty stomach w/out any other meds. 88 mcg levothyroxine daily. TSH today.  5.  Coronary artery disease.  On rosuvastatin 40 daily.  Goal LDL less than 70. LDL was 51 about 6 months ago. Lipid panel and hepatic panel today.  #6 osteoporosis. Has been on alendronate for about 1 year. Bone density testing ordered again today.   Continue alendronate, calcium, and vitamin D.   MND level and calcium checked today.  #7 TMJ instability. Discussed isometric jaw exercises today.  8.  Raynaud's phenomenon. Reassured, monitor.  #9 Preventative health care: Vaccines: Prevnar 20->given today.  Otherwise ALL UTD. No further colon or cervical cancer screening indicated. Breast ca screening->normal 08/2021.  Pt does not desire any further screening mammograms. DEXA ordered.  An After Visit Summary was printed and given to the patient.  FOLLOW UP: Return in about 6 months (around 08/12/2022) for routine chronic illness f/u.  Signed:  Crissie Sickles, MD           02/10/2022

## 2022-02-10 NOTE — Telephone Encounter (Signed)
Patient was seen today by Dr. Anitra Lauth.  Patient forgot to tell provider that she cannot taste or smell, and this is not anything new, states this has been going on for over 1 year or longer.  Please call [phone]

## 2022-02-10 NOTE — Telephone Encounter (Signed)
Pt had covid 03/23/21

## 2022-02-11 DIAGNOSIS — Z6820 Body mass index (BMI) 20.0-20.9, adult: Secondary | ICD-10-CM | POA: Diagnosis not present

## 2022-02-11 DIAGNOSIS — G3184 Mild cognitive impairment, so stated: Secondary | ICD-10-CM | POA: Diagnosis not present

## 2022-02-11 DIAGNOSIS — Z79899 Other long term (current) drug therapy: Secondary | ICD-10-CM | POA: Diagnosis not present

## 2022-02-11 DIAGNOSIS — R634 Abnormal weight loss: Secondary | ICD-10-CM | POA: Diagnosis not present

## 2022-02-12 ENCOUNTER — Telehealth: Payer: Self-pay | Admitting: Family Medicine

## 2022-02-12 NOTE — Telephone Encounter (Signed)
Yes this is fine.

## 2022-02-12 NOTE — Telephone Encounter (Signed)
Pt advised of recommendations.  

## 2022-02-12 NOTE — Telephone Encounter (Signed)
Pt called stating her daughter to her to an appt yesterday at [facility] and they prescribed escitalopram '5mg'$  1/day. She is asking if this is ok for her to take with other medications and health history.   Call back # [phone]

## 2022-02-12 NOTE — Telephone Encounter (Signed)
Please review and advise.

## 2022-02-18 ENCOUNTER — Other Ambulatory Visit: Payer: Self-pay | Admitting: *Deleted

## 2022-02-18 NOTE — Patient Outreach (Addendum)
[place] [facility]) Care Management  02/18/2022  Bonnie [name] 03/06/1941 [id number]   [facility] Unsuccessful outreach Outreach attempt to the listed at the preferred outreach number in [place] No answer. [facility] RN CM left HIPAA [facility] Portability and Accountability Act) compliant voicemail message along with CM's contact info.           Plan: [facility] RN CM scheduled this patient for another call attempt within 4-7 business days Unsuccessful outreach on 01/27/22, 02/18/22 Unsuccessful outreach letter mailed on 02/18/22   Joelene Millin L. Lavina Hamman, RN, BSN, [place] Coordinator Office number [phone]

## 2022-02-19 ENCOUNTER — Other Ambulatory Visit: Payer: Self-pay | Admitting: *Deleted

## 2022-02-19 NOTE — Patient Outreach (Incomplete)
[place] [facility]) Care Management Telephonic RN Care Manager Note   02/19/2022 Name:  Bonnie [name] MRN:  [id number] DOB:  11-26-1940  Summary: Pt returned a call to RN CM and apologized to RN CM for her delay in returning calls as she is having some social determinants of health (SDOH)  Went on 02/11/22 to have memory checked  Tinnitus ringing for long time jaw popping plus some dizziness Grandson getting married Sales executive issues Scheduled to meet with nutritionist  She reports she feels she is doing pretty good with "brain food" She is losing weight no 135 does take in ensure when has a    Recommendations/Changes made from today's visit: Reviewing her 01/2020 visit with Irena Reichmann Inquired about electrical surge  Reviewed her patient instructions from her 02/11/22 Discussed the importance of taking Lexapro and Ativan Discuss generic names Was on the board of company Encouraged puzzles  Discussed adding supplements with weight loss Reviewed a list of Brain foods (turmeric , blueberries, coffee, fatty fish (salmon, tuna*, trout, sardine, herring) broccoli, dark chocolate, nuts, oranges, eggs, green tea Discussed side effects of some medications ( ASA, benzodiazepines, tricyclic antidepressants, antibiotics, loop diuretics, beta blockers, ACE inhibitors, ARBs, Accutane cause tinnitus  Dicussed tinnitus treatment to include earwax removal, hearing aids, changing medicines, treating blood vessel conditions, white noise to decrease s/s, Tinnitus rehab  Subjective: Bonnie [name] is an 81 y.o. year old female who is a primary patient of McGowen, Adrian Blackwater, MD. The care management team was consulted for assistance with care management and/or care coordination needs.    Telephonic RN Care Manager completed Telephone Visit today.   Objective:  Medications Reviewed Today     Reviewed by Tammi Sou, MD (Physician) on 02/10/22 at [place] List Status: <None>    Medication Order Taking? Sig Documenting Provider Last Dose Status Informant  acetaminophen (TYLENOL) 500 MG tablet [id number] Yes Take 500 mg by mouth every 6 (six) hours as needed for headache (pain). [provider] Taking Active   alendronate (FOSAMAX) 70 MG tablet [id number] Yes Take 1 tablet (70 mg total) by mouth once a week. Take with a full glass of water on an empty stomach. Tammi Sou, MD Taking Active   aspirin EC 81 MG tablet [id number] Yes Take 81 mg by mouth at bedtime. [provider] Taking Active Child  Calcium Citrate-Vitamin D (CALCIUM CITRATE + D PO) [id number] Yes Take 1 tablet by mouth 2 (two) times daily.  [provider] Taking Active Child           Med Note Onalee Hua   Fri Aug 04, 2018 12:57 PM)    celecoxib (CELEBREX) 200 MG capsule [id number] Yes Take by mouth daily as needed. [provider] Taking Active   Coenzyme Q10 (CO Q 10) 60 MG CAPS [id number] Yes 1 capsule with a meal [provider] Taking Active   diphenhydrAMINE (BENADRYL) 25 mg capsule [id number] Yes 1 capsule as needed [provider] Taking Active   fexofenadine (ALLEGRA) 180 MG tablet [id number] Yes 1 tablet as needed [provider] Taking Active   folic acid (FOLVITE) 1 MG tablet [id number] Yes Take 1 mg by mouth 2 (two) times daily. [provider] Taking Active Child           Med Note Onalee Hua   Fri Aug 04, 2018 12:59 PM)    isosorbide mononitrate (IMDUR) 60 MG 24 hr tablet [id number] Yes Take  1 tablet (60 mg total) by mouth daily. Minus Breeding, MD Taking Active   levothyroxine (SYNTHROID) 88 MCG tablet [id number] Yes Take 1 tablet (88 mcg total) by mouth daily before breakfast. McGowen, Adrian Blackwater, MD Taking Active   LORazepam (ATIVAN) 0.5 MG tablet [id number] Yes 1-2 tabs po bid prn anxiety McGowen, Adrian Blackwater, MD Taking Active   methotrexate 2.5 MG tablet [id number] Yes Take 12.5 mg by mouth every  Sunday.  [provider] Taking Active Child  Multiple Vitamin (MULTIVITAMIN WITH MINERALS) TABS tablet [id number] Yes Take 1 tablet by mouth daily. [provider] Taking Active Child  nitroGLYCERIN (NITROSTAT) 0.4 MG SL tablet [id number] No Place 1 tablet (0.4 mg total) under the tongue every 5 (five) minutes x 3 doses as needed for chest pain.  Patient not taking: Reported on 02/10/2022   Minus Breeding, MD Not Taking Active   Omega-3 Fatty Acids (FISH OIL PO) [id number] Yes Take by mouth daily. [provider] Taking Active   potassium chloride (KLOR-CON) 8 MEQ tablet [id number] Yes TAKE 2 TABLETS (16 MEQ TOTAL) BY MOUTH DAILY. Minus Breeding, MD Taking Active   rosuvastatin (CRESTOR) 40 MG tablet [id number] Yes TAKE 1 TABLET (40 MG TOTAL) BY MOUTH DAILY AT 6 PM. Minus Breeding, MD Taking Active              SDOH:  (Social Determinants of Health) assessments and interventions performed:    Care Plan  Review of patient past medical history, allergies, medications, health status, including review of consultants reports, laboratory and other test data, was performed as part of comprehensive evaluation for care management services.   There are no care plans that you recently modified to display for this patient.    Plan: The patient has been provided with contact information for the care management team and has been advised to call with any health related questions or concerns.  The care management team will reach out to the patient again over the next 30+ business days.  Myrl Bynum L. Lavina Hamman, RN, BSN, [place] Coordinator Office number [phone] Main [facility] number [phone] Fax number [phone]

## 2022-02-22 ENCOUNTER — Other Ambulatory Visit: Payer: Self-pay | Admitting: Family Medicine

## 2022-02-24 ENCOUNTER — Ambulatory Visit: Payer: Self-pay | Admitting: *Deleted

## 2022-03-24 DIAGNOSIS — H2513 Age-related nuclear cataract, bilateral: Secondary | ICD-10-CM | POA: Diagnosis not present

## 2022-04-12 ENCOUNTER — Other Ambulatory Visit: Payer: Self-pay | Admitting: *Deleted

## 2022-04-12 NOTE — Patient Outreach (Signed)
  Care Management   Outreach Note  04/12/2022 Name: ANIYLA HARLING MRN: [id number] DOB: 1941-01-12  An unsuccessful telephone outreach was attempted today. The patient was referred to the case management team for ACO services/assistance with care management and care coordination on 12/15/21   Follow Up Plan:  The patient has been provided with contact information for the care management team and has been advised to call with any health related questions or concerns.  Pending case closure with change of [facility] model of care   Andriea Hasegawa L. Lavina Hamman, RN, BSN, [place] Coordinator Office number [phone] Main [facility] number [phone] Fax number [phone]

## 2022-04-13 DIAGNOSIS — M0589 Other rheumatoid arthritis with rheumatoid factor of multiple sites: Secondary | ICD-10-CM | POA: Diagnosis not present

## 2022-04-14 ENCOUNTER — Ambulatory Visit: Payer: Self-pay | Admitting: *Deleted

## 2022-04-14 NOTE — Patient Outreach (Addendum)
  Care Coordination   04/14/2022 Name: Bonnie [name] MRN: [id number] DOB: 01/17/1941   Care Coordination Outreach Attempts:  A second unsuccessful outreach was attempted today to offer the patient with information about available care coordination services as a benefit of their health plan.     Follow Up Plan:  No further outreach attempts will be made at this time. We have been unable to contact the patient to offer or enroll patient in care coordination services Pt was notified via voice message of re assignment of [facility] staff, RN CM number left for pt if further information is needed in future. Encouraged outreach as needed to review   Encounter Outcome:  No Answer  Care Coordination Interventions Activated:  No   Care Coordination Interventions:  No, not indicated    Debralee Braaksma L. Lavina Hamman, RN, BSN, [place] Coordinator Office number [phone]

## 2022-05-01 ENCOUNTER — Other Ambulatory Visit: Payer: Self-pay | Admitting: Cardiology

## 2022-05-19 ENCOUNTER — Other Ambulatory Visit: Payer: Self-pay | Admitting: Family Medicine

## 2022-05-19 DIAGNOSIS — Z01419 Encounter for gynecological examination (general) (routine) without abnormal findings: Secondary | ICD-10-CM | POA: Diagnosis not present

## 2022-05-19 DIAGNOSIS — Z1231 Encounter for screening mammogram for malignant neoplasm of breast: Secondary | ICD-10-CM

## 2022-06-17 ENCOUNTER — Encounter: Payer: Medicare Other | Attending: Family Medicine | Admitting: Dietician

## 2022-06-17 ENCOUNTER — Encounter: Payer: Self-pay | Admitting: Dietician

## 2022-06-17 DIAGNOSIS — I2510 Atherosclerotic heart disease of native coronary artery without angina pectoris: Secondary | ICD-10-CM | POA: Insufficient documentation

## 2022-06-17 DIAGNOSIS — K219 Gastro-esophageal reflux disease without esophagitis: Secondary | ICD-10-CM | POA: Diagnosis not present

## 2022-06-17 DIAGNOSIS — I219 Acute myocardial infarction, unspecified: Secondary | ICD-10-CM | POA: Insufficient documentation

## 2022-06-17 DIAGNOSIS — D649 Anemia, unspecified: Secondary | ICD-10-CM | POA: Insufficient documentation

## 2022-06-17 DIAGNOSIS — M810 Age-related osteoporosis without current pathological fracture: Secondary | ICD-10-CM | POA: Insufficient documentation

## 2022-06-17 DIAGNOSIS — M069 Rheumatoid arthritis, unspecified: Secondary | ICD-10-CM | POA: Diagnosis not present

## 2022-06-17 DIAGNOSIS — E079 Disorder of thyroid, unspecified: Secondary | ICD-10-CM | POA: Insufficient documentation

## 2022-06-17 DIAGNOSIS — Z713 Dietary counseling and surveillance: Secondary | ICD-10-CM | POA: Diagnosis not present

## 2022-06-17 DIAGNOSIS — R634 Abnormal weight loss: Secondary | ICD-10-CM | POA: Insufficient documentation

## 2022-06-17 NOTE — Patient Instructions (Addendum)
Eat breakfast earlier Avoid skipping meals Continue the Ensure but use as a snack or with a meal.  It is not a meal replacement. Make a smoothie with the Ensure (banana, frozen fruit, yogurt, peanut butter etc.) When eating out, share a meal and eat all of your portion OR put half of the meal.in a to go box and eat the rest. Add chopped walnuts to your oatmeal Add granola and fruit to your yogurt

## 2022-06-17 NOTE — Progress Notes (Signed)
Medical Nutrition Therapy  Appointment Start time:  1100  Appointment End time:  1200 Patient is here today with her husband and daughter.  Primary concerns today: Daughters are concerned about her weight loss, decreased sense of taste and lost her sense of smell (due to a concussions). Does taste sweet, and salt and spicy.  Eats only when she is hungry, does not enjoy foods, portions are small. She states that her energy is good. Just started reading the MIND diet and tries to follow this way of eating.    Referral diagnosis: abnormal weight loss Preferred learning style: no preference indicated Learning readiness: ready   NUTRITION ASSESSMENT   Anthropometrics  67" 133 lbs 06/17/2022 165 lbs 2018 135 lbs UBW recently  Clinical Medical Hx: GERD, LDL, MI, osteoporosis, thyroid disease, anemia, CAD, memory difficulty, RA Medications: synthroid, fosamax Labs: BUN 16, Creatinine 0.9, Potasium 4, GFR 50 02/10/2022, vitamin D 56 on 02/10/2022, A1C 5.7% 07/2018 Notable Signs/Symptoms: weight loss  Lifestyle & Dietary Hx Patient lives with her husband.  They share shopping and cooking. She is a retired Art gallery manager. They are from [place] and moved her in 1991.   Estimated daily fluid intake: patient unable to state oz Supplements: folic acid, INO67, calcium with D, potassium, ALIVE multivitamin, Omega-3 Sleep: 8 Stress / self-care: high ("over nothing in particular") Current average weekly physical activity: has a housekeeper but is very active  24-Hr Dietary Recall First Meal 11:00 eggs, toast or english muffin with cream cheese or butter or PB OR oatmeal with whole milk OR whole grain/nuts cereal with whole milk OR toast with cream cheese AND OJ or fruit Snack: none Second Meal 3:00 canned soup OR leftovers OR out to eat OR skips Snack: occasional corn nuts Third Meal: leftovers OR hot dogs on buns, baked beans OR sausage and peppers potatoes OR meatballs with pasta, salad AND  dessert such as ice cream at times Snack 6:00 or later:  occasional corn nuts Beverages: water, tea, coffee with half and half, selzer water, occasional regular soda, wine 1 glass daily    NUTRITION DIAGNOSIS  NB-1.1 Food and nutrition-related knowledge deficit As related to nutrition for a healthy weight.  As evidenced by diet hx and patient and family report.   NUTRITION INTERVENTION  Nutrition education (E-1) on the following topics:  Tips to increase calories Simple meal planning tips  Handouts Provided Include  My plate  Learning Style & Readiness for Change Teaching method utilized: Visual & Auditory  Demonstrated degree of understanding via: Teach Back  Barriers to learning/adherence to lifestyle change: lack of taste and smell, poor appetite  Goals Established by Pt Eat breakfast earlier Avoid skipping meals Continue the Ensure but use as a snack or with a meal.  It is not a meal replacement. Make a smoothie with the Ensure (banana, frozen fruit, yogurt, peanut butter etc.) When eating out, share a meal and eat all of your portion OR put half of the meal.in a to go box and eat the rest. Add chopped walnuts to your oatmeal Add granola and fruit to your yogurt   MONITORING & EVALUATION Dietary intake, weekly physical activity prn  Next Steps  Patient is to call for questions.

## 2022-06-19 DIAGNOSIS — Z23 Encounter for immunization: Secondary | ICD-10-CM | POA: Diagnosis not present

## 2022-06-26 ENCOUNTER — Other Ambulatory Visit: Payer: Self-pay | Admitting: Family Medicine

## 2022-06-29 ENCOUNTER — Telehealth: Payer: Self-pay | Admitting: Cardiology

## 2022-06-29 NOTE — Telephone Encounter (Signed)
*  STAT* If patient is at the pharmacy, call can be transferred to refill team.   1. Which medications need to be refilled? (please list name of each medication and dose if known)   potassium chloride (KLOR-CON) 8 MEQ tablet    2. Which pharmacy/location (including street and city if local pharmacy) is medication to be sent to?  [id number]- [place], [place] - [address]  3. Do they need a 30 day or 90 day supply? [place]

## 2022-06-30 ENCOUNTER — Other Ambulatory Visit: Payer: Self-pay

## 2022-06-30 MED ORDER — POTASSIUM CHLORIDE ER 8 MEQ PO TBCR: 16.0000 meq | EXTENDED_RELEASE_TABLET | Freq: Every day | ORAL | 3 refills | Status: DC

## 2022-06-30 NOTE — Telephone Encounter (Signed)
Medication has been filled 

## 2022-07-13 ENCOUNTER — Other Ambulatory Visit: Payer: Self-pay | Admitting: Cardiology

## 2022-07-20 ENCOUNTER — Telehealth: Payer: Self-pay | Admitting: Family Medicine

## 2022-07-20 ENCOUNTER — Ambulatory Visit (INDEPENDENT_AMBULATORY_CARE_PROVIDER_SITE_OTHER): Payer: Medicare Other | Admitting: Family Medicine

## 2022-07-20 ENCOUNTER — Encounter: Payer: Self-pay | Admitting: Family Medicine

## 2022-07-20 VITALS — BP 121/66 | HR 52 | Temp 98.0°F | Ht 67.0 in | Wt 132.8 lb

## 2022-07-20 DIAGNOSIS — G3184 Mild cognitive impairment, so stated: Secondary | ICD-10-CM

## 2022-07-20 DIAGNOSIS — E878 Other disorders of electrolyte and fluid balance, not elsewhere classified: Secondary | ICD-10-CM

## 2022-07-20 DIAGNOSIS — H9311 Tinnitus, right ear: Secondary | ICD-10-CM

## 2022-07-20 DIAGNOSIS — Z79899 Other long term (current) drug therapy: Secondary | ICD-10-CM | POA: Diagnosis not present

## 2022-07-20 DIAGNOSIS — M791 Myalgia, unspecified site: Secondary | ICD-10-CM | POA: Diagnosis not present

## 2022-07-20 DIAGNOSIS — Z6820 Body mass index (BMI) 20.0-20.9, adult: Secondary | ICD-10-CM | POA: Diagnosis not present

## 2022-07-20 DIAGNOSIS — I2510 Atherosclerotic heart disease of native coronary artery without angina pectoris: Secondary | ICD-10-CM

## 2022-07-20 DIAGNOSIS — M0589 Other rheumatoid arthritis with rheumatoid factor of multiple sites: Secondary | ICD-10-CM | POA: Diagnosis not present

## 2022-07-20 NOTE — Progress Notes (Signed)
OFFICE VISIT  07/20/2022  CC:  Chief Complaint  Patient presents with   Ear concern    Pt c/o right ear ringing and feels dizzy.    Patient is a 81 y.o. female who presents accompanied by her husband Vinnie for ear ringing, unsteady when walking.  HPI: Patient has a history of at least a year feeling unsteady when walking and right>left ear ringing. No hearing impairment endorsed. No dizziness or vertigo. She has not fallen.  No veering to one side or the other.  Also has ongoing concerns with memory. She does have mild cognitive impairment and has been followed by neurology, most recently by Dr. Brett Fairy in April of this year.  She has been evaluated at the [facility] and her diagnosis of mild cognitive impairment per patient history today.  This does get worse when she worries a lot, which seems to be quite a bit of the time.  She does repeat herself throughout the day.  Has to write everything down to keep things straight.  MRI brain imaging in the past (2022) has shown mild to moderate generalized cortical atrophy, most pronounced in the temporal lobes.  Also some changes consistent with mild chronic microvascular ischemia. She was put on a trial of donepezil in the past but this made her dizziness worse.  She declined a trial of Namenda.  She worries about her memory but she finds it hard to say whether it is gotten worse over time or not.  Her husband has difficulty expressing this as well, primarily because he struggles with expressive aphasia due to stroke.  Past Medical History:  Diagnosis Date   Beta thalassemia, heterozygous    ?alpha? pt does not recall.   CAD (coronary artery disease) 07/2018   NSTEMI (small vessel occlusion, not amenable to intervention)-->2V CAD on cath, preserved LV systolic fxn.   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  Surgical path NEG for atypica or malignancy.   Memory difficulties 12/25/2020   MGUS (monoclonal gammopathy of unknown significance) 2022   Dr. Marin Olp eval 02/2021-->he feels it is likely reactive d/t her RA.  Low suspicion of plasma cell dyscrasia-->f/u 9mo   [place] (non-ST elevated [place] infarction[place] [place]   Dr. HPercival Spanish  Osteoporosis 01/2021   01/2021 T scoree -2.7   Palpitations    08/2018 Event monitor normal. Long term event monitor 05/2020 w/out signif arrhythmia--some brief SVT.   Rheumatoid arthritis ([facility] 2007   muscle weaknes at times (Dr. BAmil Amen   Tinnitus, bilateral 2021   with high frequ SSHL->reassured by Ent    Past Surgical History:  Procedure Laterality Date   ABDOMINAL HYSTERECTOMY  1987   DUB, no malignancy.  Ovaries still in.   BLADDER REPAIR     Cardiac Event Monitor  08/22/2018   NO arrhythmias.  Her symptomatic periods coresponded to NSR.   CHOLECYSTECTOMY  1969   COLONOSCOPY  11/07/2017   2014 adenoma.  Rpt 10/2017 adenoma x 1.  No further colonoscopies needed per GI.    DEXA  01/2021   T score -2.7  LEFT HEART CATH AND CORONARY ANGIOGRAPHY N/A 08/08/2018   Procedure: LEFT HEART CATH AND CORONARY ANGIOGRAPHY;  Surgeon: Troy Sine, MD;  Location: [facility];  Service: Cardiovascular;  Laterality: N/A;   THYROIDECTOMY  2012   cytologic atypia on nodule bx; surgical path NEG for atypica or malignancy.   TRANSTHORACIC ECHOCARDIOGRAM  08/07/2018   2019 EF 60-65%, no wall motion abnl, grd II DD, mod MR.  01/2022 mild AI and MR   UPPER GASTROINTESTINAL ENDOSCOPY     VAGINAL PROLAPSE REPAIR  2012    Outpatient Medications Prior to Visit  Medication Sig  Dispense Refill   acetaminophen (TYLENOL) 500 MG tablet Take 500 mg by mouth every 6 (six) hours as needed for headache (pain).     aspirin EC 81 MG tablet Take 81 mg by mouth at bedtime.     Calcium Citrate-Vitamin D (CALCIUM CITRATE + D PO) Take 1 tablet by mouth 2 (two) times daily.      Coenzyme Q10 (CO Q 10) 60 MG CAPS 1 capsule with a meal     diphenhydrAMINE (BENADRYL) 25 mg capsule 1 capsule as needed     folic acid (FOLVITE) 1 MG tablet Take 1 mg by mouth 2 (two) times daily.     isosorbide mononitrate (IMDUR) 60 MG 24 hr tablet TAKE 1 TABLET BY MOUTH EVERY DAY 90 tablet 3   methotrexate 2.5 MG tablet Take 12.5 mg by mouth every Sunday.      Multiple Vitamin (MULTIVITAMIN WITH MINERALS) TABS tablet Take 1 tablet by mouth daily.     potassium chloride (KLOR-CON) 8 MEQ tablet Take 2 tablets (16 mEq total) by mouth daily. 180 tablet 3   rosuvastatin (CRESTOR) 40 MG tablet TAKE 1 TABLET BY MOUTH DAILY AT 6 PM. 90 tablet 1   SYNTHROID 88 MCG tablet TAKE 1 TABLET BY MOUTH DAILY BEFORE BREAKFAST. 30 tablet 0   alendronate (FOSAMAX) 70 MG tablet TAKE 1 TABLET BY MOUTH ONCE A WEEK. TAKE WITH A FULL GLASS OF WATER ON AN EMPTY STOMACH. (Patient not taking: Reported on 07/20/2022) 12 tablet 3   celecoxib (CELEBREX) 200 MG capsule Take by mouth daily as needed. (Patient not taking: Reported on 07/20/2022)     escitalopram (LEXAPRO) 5 MG tablet Take 1 tablet by mouth daily. (Patient not taking: Reported on 07/20/2022)     fexofenadine (ALLEGRA) 180 MG tablet 1 tablet as needed (Patient not taking: Reported on 07/20/2022)     LORazepam (ATIVAN) 0.5 MG tablet 1-2 tabs po bid prn anxiety (Patient not taking: Reported on 06/17/2022) 60 tablet 1   LORazepam (ATIVAN) 0.5 MG tablet Take by mouth. (Patient not taking: Reported on 06/17/2022)     nitroGLYCERIN (NITROSTAT) 0.4 MG SL tablet Place 1 tablet (0.4 mg total) under the tongue every 5 (five) minutes x 3 doses as needed for chest pain. (Patient not taking:  Reported on 07/20/2022) 25 tablet 3   Omega-3 Fatty Acids (FISH OIL PO) Take by mouth daily. (Patient not taking: Reported on 06/17/2022)     No facility-administered medications prior to visit.    Allergies  Allergen Reactions   Bacitracin-Polymyxin B     Other reaction(s): Unknown Other reaction(s): Other (See Comments) unknown   Donepezil     Vivid dreams Other reaction(s): Other (See Comments) Vivid Dreams   Neosporin [Neomycin-Bacitracin Zn-Polymyx] Other (See Comments)    Patient states that wounds or scratches never heal. (pt uses bacitracin)    Niacin  Other reaction(s): Myal;gia Other reaction(s): Myalgias (intolerance)   Other     Other reaction(s): Unknown   Seasonal Ic [Cholestatin] Other (See Comments)    Leg cramps   Triamcinolone Other (See Comments)    Burning sensation   Triamcinolone Acetonide     Other reaction(s): Unknown   Atorvastatin Other (See Comments)    Leg cramps Other reaction(s): Myalgia   Monascus Purpureus Went Yeast Other (See Comments)    "Red Yeast Rice" causes leg cramps Other reaction(s): Myalgia   Pitavastatin     Other reaction(s): Myalgia Other reaction(s): Cramps (ALLERGY/intolerance), Myalgias (intolerance)   Pravastatin Other (See Comments)    Leg cramps    ROS As per HPI  PE:    07/20/2022    9:03 AM 06/17/2022   11:11 AM 02/10/2022    9:03 AM  Vitals with BMI  Height '5\' 7"'$  '5\' 7"'$  '5\' 7"'$   Weight 132 lbs 13 oz 133 lbs 132 lbs 3 oz  BMI 20.79 48.88 91.6  Systolic 945  038  Diastolic 66  67  Pulse 52  56   Physical Exam  Gen: Alert, well appearing.  Patient is oriented to person, place, time, and situation. AFFECT: pleasant, lucid thought and speech. Neuro: CN 2-12 intact bilaterally, strength 5/5 in proximal and distal upper extremities and lower extremities bilaterally.  No sensory deficits.  No tremor.  No disdiadochokinesis.  No ataxia.  Upper extremity and lower extremity DTRs symmetric.  No pronator  drift.   LABS:  none Lab Results  Component Value Date   TSH 0.78 02/10/2022   Lab Results  Component Value Date   CHOL 143 02/10/2022   HDL 71.90 02/10/2022   LDLCALC 59 02/10/2022   LDLDIRECT 155.5 12/04/2012   TRIG 57.0 02/10/2022   CHOLHDL 2 02/10/2022     Chemistry      Component Value Date/Time   NA 141 02/10/2022 1006   NA 141 08/29/2018 0000   K 4.0 02/10/2022 1006   CL 105 02/10/2022 1006   CO2 28 02/10/2022 1006   BUN 16 02/10/2022 1006   BUN 18 08/29/2018 0000   CREATININE 0.90 02/10/2022 1006   CREATININE 0.81 08/27/2021 0933   CREATININE 0.99 (H) 10/12/2019 1357      Component Value Date/Time   CALCIUM 9.2 02/10/2022 1006   ALKPHOS 35 (L) 02/10/2022 1006   AST 18 02/10/2022 1006   AST 20 08/27/2021 0933   ALT 11 02/10/2022 1006   ALT 12 08/27/2021 0933   BILITOT 0.5 02/10/2022 1006   BILITOT 0.5 08/27/2021 0933      IMPRESSION AND PLAN:  #1 chronic tinnitus, right greater than left. Denies hearing impairment. She has a vague disequilibrium syndrome that seemed to coincide with onset of her ringing, but admittedly patient and husband cannot be clear on this. We will ask ENT to see her.  #2 memory impairment. Mild cognitive impairment, normal slow progression.  She remains functional. I reassured her.  Donepezil in the past made her dizzy.  She has declined a trial of Namenda in the past. Encouraged her to arrange a routine follow-up with her neurologist.  An After Visit Summary was printed and given to the patient.  FOLLOW UP: Return in about 6 months (around 01/18/2023) for routine chronic illness f/u.  Signed:  Crissie Sickles, MD           07/20/2022

## 2022-07-20 NOTE — Telephone Encounter (Signed)
Please call patient and tell her that upon further consideration today I decided I would like her to see ear nose and throat doctor for further evaluation of the ringing in her ear and her disequilibrium.  I have ordered referral.

## 2022-07-20 NOTE — Patient Instructions (Signed)
Team Member Role and Air traffic controller Info Address Start End Comments  Dohmeier, Asencion Partridge, MD Consulting Physician (Neurology) Phone: [phone] Fax: [phone] Email: [email or web address] [address] [place] [address] 01/18/2022 - -   Pls call and arrange a follow up appointment with Dr. Brett Fairy for your memory and balance problems.

## 2022-07-20 NOTE — Telephone Encounter (Signed)
Pt advised of recommendations.  

## 2022-07-29 DIAGNOSIS — R21 Rash and other nonspecific skin eruption: Secondary | ICD-10-CM | POA: Diagnosis not present

## 2022-08-12 ENCOUNTER — Ambulatory Visit: Payer: Medicare Other | Admitting: Family Medicine

## 2022-08-12 NOTE — Progress Notes (Deleted)
OFFICE VISIT  08/12/2022  CC: No chief complaint on file.   Patient is a 81 y.o. female who presents accompanied by her husband Vinnie for  6 mo f/u GAD, HLD, Hypothyroidism, and osteoporosis. A/P as of last visit: "#1 mild cognitive impairment. Stable. She had neuro follow-up 01/06/2022, no new work-up or changes made. She has been intolerant of Aricept.  She has declined any additional medication. She has evaluation with [facility] on 03/25/2022.  2.  Generalized anxiety with recent adjustment disorder with mixed anxious/depressed mood. I do not think she is clinically depressed. Reassurance given today regarding her overall health. She has lorazepam to use as needed but rarely uses it.   #3 dizziness, nonspecific. Orthostatic hypotension may be playing a role, although orthostatic measurements today did not support this. She has longstanding resting sinus bradycardia. Emphasized importance of drinking at least 65 ounces of fluids a day. Behavioral adjustment--weight briefly upon standing for walking.  Sit and rest if feeling dizzy. Of note, MRI of brain without contrast 12/29/2020 showed mild to moderate generalized cortical atrophy and some chronic microvascular ischemic changes.   #4 hypothyroidism. Hypoth: Takes T4 on empty stomach w/out any other meds. 88 mcg levothyroxine daily. TSH today.  5.  Coronary artery disease.  On rosuvastatin 40 daily.  Goal LDL less than 70. LDL was 51 about 6 months ago. Lipid panel and hepatic panel today.   #6 osteoporosis. Has been on alendronate for about 1 year. Bone density testing ordered again today.   Continue alendronate, calcium, and vitamin D.   MND level and calcium checked today.   #7 TMJ instability. Discussed isometric jaw exercises today.  8.  Raynaud's phenomenon. Reassured, monitor.   #9 Preventative health care: Vaccines: Prevnar 20->given today.  Otherwise ALL UTD. No further colon or cervical  cancer screening indicated. Breast ca screening->normal 08/2021.  Pt does not desire any further screening mammograms. DEXA ordered."  INTERIM HX: Labs were all normal last visit. ***    Past Medical History:  Diagnosis Date   Beta thalassemia, heterozygous    ?alpha? pt does not recall.   CAD (coronary artery disease) 07/2018   NSTEMI (small vessel occlusion, not amenable to intervention)-->2V CAD on cath, preserved LV systolic fxn.   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  Surgical path NEG for atypica or malignancy.   Memory difficulties 12/25/2020   MGUS (monoclonal gammopathy of unknown significance) 2022   Dr. Marin Olp eval 02/2021-->he feels it is likely reactive d/t her RA.  Low suspicion of plasma cell dyscrasia-->f/u 22mo   [place]ST [place]   Dr. HPercival Spanish  Osteoporosis 01/2021   01/2021 T scoree -2.7   Palpitations    08/2018 Event monitor normal. Long term event monitor 05/2020 w/out signif arrhythmia--some brief SVT.   [place]heumatoid arthritis ([place] 2007   muscle weaknes at times (Dr. BAmil Amen   Tinnitus, bilateral 2021   with high frequ SSHL->reassured by Ent    Past Surgical History:  Procedure Laterality Date   ABDOMINAL HYSTERECTOMY  1987   DUB, no malignancy.  Ovaries still in.   BLADDER REPAIR     Cardiac Event Monitor  08/22/2018   NO arrhythmias.  Her symptomatic periods coresponded to NSR.   CHOLECYSTECTOMY  1969   COLONOSCOPY  11/07/2017   2014 adenoma.  Rpt 10/2017 adenoma x 1.  No further colonoscopies needed per GI.     DEXA  01/2021   T score -2.7   LEFT HEART CATH AND CORONARY ANGIOGRAPHY N/A 08/08/2018   Procedure: LEFT HEART CATH AND CORONARY ANGIOGRAPHY;  Surgeon: Troy Sine, MD;  Location: [facility];  Service: Cardiovascular;  Laterality: N/A;   THYROIDECTOMY  2012   cytologic atypia on nodule bx; surgical path NEG for atypica or malignancy.   TRANSTHORACIC ECHOCARDIOGRAM  08/07/2018   2019 EF 60-65%, no wall motion abnl, grd II DD, mod MR.  01/2022 mild AI and MR   UPPER GASTROINTESTINAL ENDOSCOPY     VAGINAL PROLAPSE REPAIR  2012    Outpatient Medications Prior to Visit  Medication Sig Dispense Refill   acetaminophen (TYLENOL) 500 MG tablet Take 500 mg by mouth every 6 (six) hours as needed for headache (pain).     alendronate (FOSAMAX) 70 MG tablet TAKE 1 TABLET BY MOUTH ONCE A WEEK. TAKE WITH A FULL GLASS OF WATER ON AN EMPTY STOMACH. (Patient not taking: Reported on 07/20/2022) 12 tablet 3   aspirin EC 81 MG tablet Take 81 mg by mouth at bedtime.     Calcium Citrate-Vitamin D (CALCIUM CITRATE + D PO) Take 1 tablet by mouth 2 (two) times daily.      celecoxib (CELEBREX) 200 MG capsule Take by mouth daily as needed. (Patient not taking: Reported on 07/20/2022)     Coenzyme Q10 (CO Q 10) 60 MG CAPS 1 capsule with a meal     diphenhydrAMINE (BENADRYL) 25 mg capsule 1 capsule as needed     escitalopram (LEXAPRO) 5 MG tablet Take 1 tablet by mouth daily. (Patient not taking: Reported on 07/20/2022)     fexofenadine (ALLEGRA) 180 MG tablet 1 tablet as needed (Patient not taking: Reported on 85/04/8501)     folic acid (FOLVITE) 1 MG tablet Take 1 mg by mouth 2 (two) times daily.     isosorbide mononitrate (IMDUR) 60 MG 24 hr tablet TAKE 1 TABLET BY MOUTH EVERY DAY 90 tablet 3   LORazepam (ATIVAN) 0.5 MG tablet 1-2 tabs po bid prn anxiety (Patient not taking: Reported on 06/17/2022) 60 tablet 1   LORazepam (ATIVAN) 0.5 MG tablet Take by mouth. (Patient not taking: Reported on 06/17/2022)      methotrexate 2.5 MG tablet Take 12.5 mg by mouth every Sunday.      Multiple Vitamin (MULTIVITAMIN WITH MINERALS) TABS tablet Take 1 tablet by mouth daily.     nitroGLYCERIN (NITROSTAT) 0.4 MG SL tablet Place 1 tablet (0.4 mg total) under the tongue every 5 (five) minutes x 3 doses as needed for chest pain. (Patient not taking: Reported on 07/20/2022) 25 tablet 3   Omega-3 Fatty Acids (FISH OIL PO) Take by mouth daily. (Patient not taking: Reported on 06/17/2022)     potassium chloride (KLOR-CON) 8 MEQ tablet Take 2 tablets (16 mEq total) by mouth daily. 180 tablet 3   rosuvastatin (CRESTOR) 40 MG tablet TAKE 1 TABLET BY MOUTH DAILY AT 6 PM. 90 tablet 1   SYNTHROID 88 MCG tablet TAKE 1 TABLET BY MOUTH DAILY BEFORE BREAKFAST. 30 tablet 0   No facility-administered medications prior to visit.  Allergies  Allergen Reactions   Bacitracin-Polymyxin B     Other reaction(s): Unknown Other reaction(s): Other (See Comments) unknown   Donepezil     Vivid dreams Other reaction(s): Other (See Comments) Vivid Dreams   Neosporin [Neomycin-Bacitracin Zn-Polymyx] Other (See Comments)    Patient states that wounds or scratches never heal. (pt uses bacitracin)    Niacin     Other reaction(s): Myal;gia Other reaction(s): Myalgias (intolerance)   Other     Other reaction(s): Unknown   Seasonal Ic [Cholestatin] Other (See Comments)    Leg cramps   Triamcinolone Other (See Comments)    Burning sensation   Triamcinolone Acetonide     Other reaction(s): Unknown   Atorvastatin Other (See Comments)    Leg cramps Other reaction(s): Myalgia   Monascus Purpureus Went Yeast Other (See Comments)    "Red Yeast Rice" causes leg cramps Other reaction(s): Myalgia   Pitavastatin     Other reaction(s): Myalgia Other reaction(s): Cramps (ALLERGY/intolerance), Myalgias (intolerance)   Pravastatin Other (See Comments)    Leg cramps    ROS As per HPI  PE:    07/20/2022    9:03 AM 06/17/2022   11:11 AM  02/10/2022    9:03 AM  Vitals with BMI  Height '5\' 7"'$  '5\' 7"'$  '5\' 7"'$   Weight 132 lbs 13 oz 133 lbs 132 lbs 3 oz  BMI 20.79 56.31 49.7  Systolic 026  378  Diastolic 66  67  Pulse 52  56     Physical Exam  ***  LABS:  Last CBC Lab Results  Component Value Date   WBC 4.7 02/10/2022   HGB 12.3 02/10/2022   HCT 36.7 02/10/2022   MCV 94.5 02/10/2022   MCH 31.6 08/27/2021   RDW 14.1 02/10/2022   PLT 229.0 58/85/0277   Last metabolic panel Lab Results  Component Value Date   GLUCOSE 82 02/10/2022   NA 141 02/10/2022   K 4.0 02/10/2022   CL 105 02/10/2022   CO2 28 02/10/2022   BUN 16 02/10/2022   CREATININE 0.90 02/10/2022   GFRNONAA >60 08/27/2021   CALCIUM 9.2 02/10/2022   PROT 6.4 02/10/2022   ALBUMIN 3.8 02/10/2022   LABGLOB 3.1 08/27/2021   AGRATIO 1.2 08/27/2021   BILITOT 0.5 02/10/2022   ALKPHOS 35 (L) 02/10/2022   AST 18 02/10/2022   ALT 11 02/10/2022   ANIONGAP 6 08/27/2021   Last lipids Lab Results  Component Value Date   CHOL 143 02/10/2022   HDL 71.90 02/10/2022   LDLCALC 59 02/10/2022   LDLDIRECT 155.5 12/04/2012   TRIG 57.0 02/10/2022   CHOLHDL 2 02/10/2022   Last hemoglobin A1c Lab Results  Component Value Date   HGBA1C 5.7 (H) 08/07/2018   Last thyroid functions Lab Results  Component Value Date   TSH 0.78 02/10/2022   Last vitamin D Lab Results  Component Value Date   VD25OH 56.84 02/10/2022   Last vitamin B12 and Folate Lab Results  Component Value Date   AJOINOMV67 209 12/25/2020   FOLATE 12.7 06/28/2007   IMPRESSION AND PLAN:  No problem-specific Assessment & Plan notes found for this encounter.  hypothyroidism. Hypoth: Takes T4 on empty stomach w/out any other meds. 88 mcg levothyroxine daily. TSH today  Coronary artery disease.  On rosuvastatin 40 daily.  Goal LDL less than 70. LDL was 59 about 6 months ago. Lipid panel and hepatic panel today  osteoporosis. Has been on alendronate for about 18 months. Rpt DEXA  ordered 6  mo ago-->not scheduled. Continue alendronate, calcium, and vitamin D level and calcium checked today.  Preventative health care: Vaccines: ALL UTD. No further colon or cervical cancer screening indicated. Breast ca screening->normal 08/2021.  Mammogram scheduled for next month.   DEXA ordered at last visit in May this year.  Not scheduled yet.  An After Visit Summary was printed and given to the patient.  FOLLOW UP: No follow-ups on file.  Signed:  Crissie Sickles, MD           08/12/2022

## 2022-08-20 ENCOUNTER — Ambulatory Visit
Admission: RE | Admit: 2022-08-20 | Discharge: 2022-08-20 | Disposition: A | Discharge status: Home / Self Care | Payer: Medicare Other | Source: Ambulatory Visit | Attending: Family Medicine | Admitting: Family Medicine

## 2022-08-20 DIAGNOSIS — Z1231 Encounter for screening mammogram for malignant neoplasm of breast: Secondary | ICD-10-CM

## 2022-08-27 ENCOUNTER — Inpatient Hospital Stay: Payer: Medicare Other | Attending: Hematology & Oncology

## 2022-08-27 ENCOUNTER — Inpatient Hospital Stay: Payer: Medicare Other | Admitting: Hematology & Oncology

## 2022-08-29 ENCOUNTER — Other Ambulatory Visit: Payer: Self-pay | Admitting: Family Medicine

## 2022-09-08 ENCOUNTER — Encounter: Payer: Self-pay | Admitting: Family Medicine

## 2022-09-09 ENCOUNTER — Telehealth: Payer: Self-pay | Admitting: Family Medicine

## 2022-09-09 NOTE — Telephone Encounter (Signed)
Spoke with patient req CB 09/15/2022

## 2022-09-18 ENCOUNTER — Other Ambulatory Visit: Payer: Self-pay | Admitting: Family Medicine

## 2022-09-24 DIAGNOSIS — Z6820 Body mass index (BMI) 20.0-20.9, adult: Secondary | ICD-10-CM | POA: Diagnosis not present

## 2022-09-24 DIAGNOSIS — B353 Tinea pedis: Secondary | ICD-10-CM | POA: Diagnosis not present

## 2022-09-27 NOTE — Progress Notes (Signed)
Subjective:   Bonnie [name] is a 82 y.o. female who presents for Medicare Annual (Subsequent) preventive examination.  Review of Systems    Defer to PCP Cardiac Risk Factors include: advanced age (>61mn, >>51women);dyslipidemia     Objective:    Today's Vitals   09/29/22 1043  BP: 127/78  Pulse: 63  Temp: 97.9 F (36.6 C)  SpO2: 98%  Weight: 128 lb (58.1 kg)  Height: '5\' 7"'$  (1.702 m)   Body mass index is 20.05 kg/m.     09/29/2022   10:04 AM 06/17/2022   11:09 AM 09/16/2021    2:47 PM 08/27/2021   10:05 AM 02/24/2021   11:23 AM 08/27/2020    8:18 AM 09/07/2018   10:11 PM  Advanced Directives  Does Patient Have a Medical Advance Directive? Yes Yes Yes Yes Yes Yes No  Type of AP[place]amed[place]of [place] will  Healthcare Power of [place] will [place] will Living will   Does patient want to make changes to medical advance directive? No - Patient declined   No - Patient declined No - Patient declined    Copy of [place] Chart? Yes - validated most recent copy scanned in chart (See row information)  No - copy requested  No - copy requested    Would patient like information on creating a medical advance directive?     No - Patient declined      Current Medications (verified) Outpatient Encounter Medications as of 09/29/2022  Medication Sig   acetaminophen (TYLENOL) 500 MG tablet Take 500 mg by mouth every 6 (six) hours as needed for headache (pain).   alendronate (FOSAMAX) 70 MG tablet TAKE 1 TABLET BY MOUTH ONCE A WEEK. TAKE WITH A FULL GLASS OF WATER ON AN EMPTY STOMACH. (Patient not taking: Reported on 07/20/2022)   aspirin EC 81 MG tablet Take 81 mg by mouth at bedtime.   Calcium Citrate-Vitamin D (CALCIUM CITRATE + D PO) Take 1 tablet by mouth 2 (two) times daily.    celecoxib (CELEBREX) 200 MG capsule Take by mouth daily as needed. (Patient not taking:  Reported on 07/20/2022)   Coenzyme Q10 (CO Q 10) 60 MG CAPS 1 capsule with a meal   diphenhydrAMINE (BENADRYL) 25 mg capsule 1 capsule as needed   escitalopram (LEXAPRO) 5 MG tablet Take 1 tablet by mouth daily. (Patient not taking: Reported on 07/20/2022)   fexofenadine (ALLEGRA) 180 MG tablet 1 tablet as needed (Patient not taking: Reported on 106/10/6946   folic acid (FOLVITE) 1 MG tablet Take 1 mg by mouth 2 (two) times daily.   isosorbide mononitrate (IMDUR) 60 MG 24 hr tablet TAKE 1 TABLET BY MOUTH EVERY DAY   LORazepam (ATIVAN) 0.5 MG tablet 1-2 tabs po bid prn anxiety (Patient not taking: Reported on 06/17/2022)   LORazepam (ATIVAN) 0.5 MG tablet Take by mouth. (Patient not taking: Reported on 06/17/2022)   methotrexate 2.5 MG tablet Take 12.5 mg by mouth every Sunday.    Multiple Vitamin (MULTIVITAMIN WITH MINERALS) TABS tablet Take 1 tablet by mouth daily.   nitroGLYCERIN (NITROSTAT) 0.4 MG SL tablet Place 1 tablet (0.4 mg total) under the tongue every 5 (five) minutes x 3 doses as needed for chest pain. (Patient not taking: Reported on 07/20/2022)   Omega-3 Fatty Acids (FISH OIL PO) Take by mouth daily. (Patient not taking: Reported on 06/17/2022)   potassium chloride (KLOR-CON) 8 MEQ tablet Take  2 tablets (16 mEq total) by mouth daily.   rosuvastatin (CRESTOR) 40 MG tablet TAKE 1 TABLET BY MOUTH DAILY AT 6 PM.   SYNTHROID 88 MCG tablet TAKE 1 TABLET BY MOUTH DAILY BEFORE BREAKFAST.   No facility-administered encounter medications on file as of 09/29/2022.    Allergies (verified) Bacitracin-polymyxin b, Donepezil, Neosporin [neomycin-bacitracin zn-polymyx], Niacin, Other, Seasonal ic [cholestatin], Triamcinolone, Triamcinolone acetonide, Atorvastatin, Monascus purpureus went yeast, Pitavastatin, and Pravastatin   History: Past Medical History:  Diagnosis Date   Beta thalassemia, heterozygous    ?alpha? pt does not recall.   CAD (coronary artery disease) 07/2018   NSTEMI (small vessel  occlusion, not amenable to intervention)-->2V CAD on cath, preserved LV systolic fxn.   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  Surgical path NEG for atypica or malignancy.   Memory difficulties 12/25/2020   MGUS (monoclonal gammopathy of unknown significance) 2022   Dr. Marin Olp eval 02/2021-->he feels it is likely reactive d/t her RA.  Low suspicion of plasma cell dyscrasia-->f/u 35mo   NSTEMI (non-ST elevated myocardial infarction) [place]   Dr. HPercival Spanish  Osteoporosis 01/2021   01/2021 T scoree -2.7   Palpitations    08/2018 Event monitor normal. Long term event monitor 05/2020 w/out signif arrhythmia--some brief SVT.   [place]heumatoid arthritis [place]   muscle weaknes at times (Dr. BAmil Amen   Tinnitus, bilateral 2021   with high frequ SSHL->reassured by Ent   Past Surgical History:  Procedure Laterality Date   ABDOMINAL HYSTERECTOMY  1987   DUB, no malignancy.  Ovaries still in.   BLADDER REPAIR     Cardiac Event Monitor  08/22/2018   NO arrhythmias.  Her symptomatic periods coresponded to NSR.   CHOLECYSTECTOMY  1969   COLONOSCOPY  11/07/2017   2014 adenoma.  Rpt 10/2017 adenoma x 1.  No further colonoscopies needed per GI.    DEXA  01/2021   T score -2.7   LEFT HEART CATH AND CORONARY ANGIOGRAPHY N/A 08/08/2018   Procedure: LEFT HEART CATH AND CORONARY ANGIOGRAPHY;  Surgeon: KTroy Sine MD;  [facility];  Service: Cardiovascular;  Laterality: N/A;   THYROIDECTOMY  2012   cytologic atypia on nodule bx; surgical path NEG for atypica or malignancy.   TRANSTHORACIC  ECHOCARDIOGRAM  08/07/2018   2019 EF 60-65%, no wall motion abnl, grd II DD, mod MR.  01/2022 mild AI and MR   UPPER GASTROINTESTINAL ENDOSCOPY     VAGINAL PROLAPSE REPAIR  2012   Family History  Problem Relation Age of Onset   Hypertension Mother    CVA Mother    Colon cancer Neg Hx    Esophageal cancer Neg Hx    Stomach cancer Neg Hx    Rectal cancer Neg Hx    Social History   Socioeconomic History   Marital status: Married    Spouse name: VEvette Doffing  Number of children: 5   Years of education: Not on file   Highest education level: Not on file  Occupational History   Not on file  Tobacco Use  Smoking status: Former    Packs/day: 0.50    Years: 10.00    Total pack years: 5.00    Types: Cigarettes    Quit date: 11/19/1973    Years since quitting: 48.8   Smokeless tobacco: Never  Vaping Use   Vaping Use: Never used  Substance and Sexual Activity   Alcohol use: Yes    Comment: very rare glass of wine ETOH use - wine with dinner- ,   Drug use: No   Sexual activity: Yes  Other Topics Concern   Not on file  Social History Narrative   Patient is an Optometrist , retired from  Science [employer], school aid,Lives with husband in [place].   Right Handed   Drinks 4-5 cups caffeine daily   Tobacco use former.  quit 50 years ago ,   ETOH use - wine with dinner- ,    Caffeine intake in form of Coffee( 2 cups in AM  ) Soda( when eating out) Tea ( decaff) or energy drinks.   Regular exercise in form of walking .     Hobbies :reading, volunteering.    Social Determinants of Health   Financial Resource Strain: Low Risk  (09/29/2022)   Overall Financial Resource Strain (CARDIA)    Difficulty of Paying Living Expenses: Not very hard  Food Insecurity: No Food Insecurity (09/29/2022)   Hunger Vital Sign    Worried About Running Out of Food in the Last Year: Never true    Ran Out of Food in the Last Year: Never true  Transportation Needs: No Transportation Needs (09/29/2022)   PRAPARE -  Hydrologist (Medical): No    Lack of Transportation (Non-Medical): No  Physical Activity: Insufficiently Active (09/29/2022)   Exercise Vital Sign    Days of Exercise per Week: 4 days    Minutes of Exercise per Session: 10 min  Stress: No Stress Concern Present (09/29/2022)   [place]    Feeling of Stress : Not at all  Social Connections: [place] (09/29/2022)   Social Connection and Isolation Panel [NHANES]    Frequency of Communication with Friends and Family: More than three times a week    Frequency of Social Gatherings with Friends and Family: More than three times a week    Attends Religious Services: 1 to 4 times per year    Active Member of [employer] or Organizations: Yes    Attends Archivist Meetings: 1 to 4 times per year    Marital Status: Married    Tobacco Counseling Counseling given: Not Answered   Clinical Intake:  Pre-visit preparation completed: No  Pain : No/denies pain     Nutritional Risks: None Diabetes: No  What is the last grade level you completed in school?: 1 yr of college  Diabetic?no         Activities of Daily Living    09/29/2022   10:04 AM  In your present state of health, do you have any difficulty performing the following activities:  Hearing? 0  Vision? 0  Difficulty concentrating or making decisions? 0  Walking or climbing stairs? 0  Dressing or bathing? 0  Doing errands, shopping? 0  Preparing Food and eating ? N  Using the Toilet? N  In the past six months, have you accidently leaked urine? N  Do you have problems with loss of bowel control? N  Managing your Medications? N  Managing your Finances?  N  Housekeeping or managing your Housekeeping? N    Patient Care Team: Tammi Sou, MD as PCP - General (Family Medicine) Minus Breeding, MD as PCP - Cardiology (Cardiology) Altheimer, Legrand Como, MD as  Consulting Physician (Endocrinology) Ladene Artist, MD as Consulting Physician (Gastroenterology) Minus Breeding, MD as Consulting Physician (Cardiology) Olena Leatherwood, MD as Consulting Physician (Rheumatology) Vania Rea, MD as Consulting Physician (Obstetrics and Gynecology) Armandina Gemma, MD as Consulting Physician (General Surgery) Jolene Provost, PA-C as Physician Assistant (Otolaryngology) Dohmeier, Asencion Partridge, MD as Consulting Physician (Neurology) Leroux-Martinez, Nancy Marus, AUD (Audiology)  Indicate any recent Medical Services you may have received from other than [facility] providers in the past year (date may be approximate).     Assessment:   This is a routine wellness examination for [facility].  Hearing/Vision screen No results found.  Dietary issues and exercise activities discussed: Current Exercise Habits: Home exercise routine, Type of exercise: walking;strength training/weights, Time (Minutes): 10, Frequency (Times/Week): 4, Weekly Exercise (Minutes/Week): 40, Exercise limited by: None identified   Goals Addressed             This Visit's Progress    Matintain My Quality of Life          Notes: eat right      Depression Screen    09/29/2022   10:01 AM 06/17/2022   11:08 AM 02/10/2022    9:01 AM 01/18/2022    4:28 PM 09/16/2021    2:44 PM 08/12/2021    8:17 AM 08/27/2020    8:20 AM  PHQ 2/9 Scores  PHQ - 2 Score '1 1 1 '$ 0 '1 1 1    '$ Fall Risk    09/29/2022   10:04 AM 06/17/2022   11:08 AM 02/10/2022    9:01 AM 01/18/2022    3:59 PM 09/16/2021    2:50 PM  Fall Risk   Falls in the past year? 0 0 0 0 0  Number falls in past yr: 0  0 0 0  Injury with Fall? 0  0 0 0  Risk for fall due to : No Fall Risks  Impaired vision History of fall(s);Impaired balance/gait Impaired vision  Risk for fall due to: Comment    fall hx x 2 last on 09/07/18 with concussion right frontl scalp laceration, another at [place] with LOC before 2019   Follow up Falls evaluation  completed  Falls evaluation completed Falls evaluation completed;Falls prevention discussed Falls prevention discussed    FALL RISK PREVENTION PERTAINING TO THE HOME:  Any stairs in or around the home? Yes  If so, are there any without handrails? No  Home free of loose throw rugs in walkways, pet beds, electrical cords, etc? Yes  Adequate lighting in your home to reduce risk of falls? Yes   ASSISTIVE DEVICES UTILIZED TO PREVENT FALLS:  Life alert? Yes  Use of a cane, walker or w/c? No  Grab bars in the bathroom? Yes  Shower chair or bench in shower? Yes  Elevated toilet seat or a handicapped toilet? No   TIMED UP AND GO:  Was the test performed? Yes .  Length of time to ambulate 10 feet: 7 sec.   Gait steady and fast without use of assistive device  Cognitive Function:    01/06/2022    8:27 AM 05/06/2021   10:49 AM 12/25/2020    8:20 AM  MMSE - Mini Mental State Exam  Orientation to time '5 4 3  '$ Orientation to Place 5 4 5  Registration '3 3 3  '$ Attention/ Calculation '1 3 5  '$ Recall '3 1 3  '$ Language- name 2 objects '2 2 2  '$ Language- repeat '1 1 1  '$ Language- follow 3 step command '3 3 3  '$ Language- read & follow direction '1 1 1  '$ Write a sentence '1 1 1  '$ Copy design 1 0 1  Total score '26 23 28        '$ 09/29/2022   10:07 AM 08/27/2020    8:29 AM  6CIT Screen  What Year? 4 points 0 points  What month? 0 points 0 points  What time? 0 points 3 points  Count back from 20 0 points 0 points  Months in reverse 0 points 0 points  Repeat phrase 6 points 0 points  Total Score 10 points 3 points    Immunizations Immunization History  Administered Date(s) Administered   Fluad Quad(high Dose 65+) 07/28/2020, 06/09/2021, 06/19/2022   Hepatitis A 05/26/2006, 06/27/2006   Hepatitis B 05/26/2006, 06/27/2006, 01/20/2007   IPV 08/01/1941   Influenza Split 06/14/2011, 06/14/2012   Influenza Whole 07/10/2007, 07/17/2009, 08/20/2010   Influenza, High Dose Seasonal PF 07/08/2014,  05/20/2016, 06/22/2017, 08/04/2018   Influenza-Unspecified 06/13/2016   PFIZER(Purple Top)SARS-COV-2 Vaccination 10/05/2019, 10/25/2019, 06/17/2020, 12/24/2020   PNEUMOCOCCAL CONJUGATE-20 08/12/2021   Pneumococcal Polysaccharide-23 06/02/2007, 07/14/2012   Respiratory Syncytial Virus Vaccine,Recomb Aduvanted(Arexvy) 06/19/2022   Td 09/13/2005   Tdap 11/11/2018   Typhoid Parenteral 05/26/2006   Varicella 06[birth year]02/1955   Varicella Zoster Immune Globulin 07/28/2012   Zoster Recombinat (Shingrix) 11/11/2018, 03/01/2019, 06/17/2020   Zoster, Live 07/29/2012, 11/11/2018    TDAP status: Up to date  Flu Vaccine status: Up to date  Pneumococcal vaccine status: Up to date  Covid-19 vaccine status: Completed vaccines  Qualifies for Shingles Vaccine? Yes   Zostavax completed No   Shingrix Completed?: Yes  Screening Tests Health Maintenance  Topic Date Due   COVID-19 Vaccine (5 - 2023-24 season) 05/14/2022   MAMMOGRAM  08/21/2023   Medicare Annual Wellness (AWV)  09/30/2023   DTaP/Tdap/Td (3 - Td or Tdap) 11/10/2028   Pneumonia Vaccine 82+ Years old  Completed   INFLUENZA VACCINE  Completed   DEXA SCAN  Completed   Zoster Vaccines- Shingrix  Completed   HPV VACCINES  Aged Out    Health Maintenance  Health Maintenance Due  Topic Date Due   COVID-19 Vaccine (5 - 2023-24 season) 05/14/2022    Colorectal cancer screening: No longer required.   Mammogram status: Completed 12/028/23. Repeat every year  Bone Density status: Completed 01/07/21. Results reflect: Bone density results: OSTEOPOROSIS. Repeat every 2 years.  Lung Cancer Screening: (Low Dose CT Chest recommended if Age 31-80 years, 30 pack-year currently smoking OR have quit w/in 15years.) does not qualify.   Lung Cancer Screening Referral: n/a  Additional Screening:  Hepatitis C Screening: does not qualify; Completed n/a  Vision Screening: Recommended annual ophthalmology exams for early detection of glaucoma and  other disorders of the eye. Is the patient up to date with their annual eye exam?  Yes  Who is the provider or what is the name of the office in which the patient attends annual eye exams? Dr. Clydene Laming If pt is not established with a provider, would they like to be referred to a provider to establish care? No .   Dental Screening: Recommended annual dental exams for proper oral hygiene  Community Resource Referral / Chronic Care Management: CRR required this visit?  No   CCM required this visit?  No  Plan:     I have personally reviewed and noted the following in the patient's chart:   Medical and social history Use of alcohol, tobacco or illicit drugs  Current medications and supplements including opioid prescriptions. Patient is not currently taking opioid prescriptions. Functional ability and status Nutritional status Physical activity Advanced directives List of other physicians Hospitalizations, surgeries, and ER visits in previous 12 months Vitals Screenings to include cognitive, depression, and falls Referrals and appointments  In addition, I have reviewed and discussed with patient certain preventive protocols, quality metrics, and best practice recommendations. A written personalized care plan for preventive services as well as general preventive health recommendations were provided to patient.     Beatrix Fetters, [place]   09/29/2022   Nurse Notes: Non-Face to Face or Face to Face 15 minute visit Encounter     Bonnie [name] , Thank you for taking time to come for your Medicare Wellness Visit. I appreciate your ongoing commitment to your health goals. Please review the following plan we discussed and let me know if I can assist you in the future.   These are the goals we discussed:  Goals      Matintain My Quality of Life        Notes: eat right     Patient Stated     Drink more water & do more walking     Patient Stated     None at this time         This is a list of the screening recommended for you and due dates:  Health Maintenance  Topic Date Due   COVID-19 Vaccine (5 - 2023-24 season) 05/14/2022   Mammogram  08/21/2023   Medicare Annual Wellness Visit  09/30/2023   DTaP/Tdap/Td vaccine (3 - Td or Tdap) 11/10/2028   Pneumonia Vaccine  Completed   Flu Shot  Completed   DEXA scan (bone density measurement)  Completed   Zoster (Shingles) Vaccine  Completed   HPV Vaccine  Aged Out

## 2022-09-27 NOTE — Patient Instructions (Signed)

## 2022-09-29 ENCOUNTER — Ambulatory Visit (INDEPENDENT_AMBULATORY_CARE_PROVIDER_SITE_OTHER): Payer: Medicare Other

## 2022-09-29 VITALS — BP 127/78 | HR 63 | Temp 97.9°F | Ht 67.0 in | Wt 128.0 lb

## 2022-09-29 DIAGNOSIS — Z0000 Encounter for general adult medical examination without abnormal findings: Secondary | ICD-10-CM | POA: Diagnosis not present

## 2022-10-28 ENCOUNTER — Telehealth: Payer: Self-pay | Admitting: Cardiology

## 2022-10-28 DIAGNOSIS — M79604 Pain in right leg: Secondary | ICD-10-CM | POA: Diagnosis not present

## 2022-10-28 DIAGNOSIS — M79651 Pain in right thigh: Secondary | ICD-10-CM | POA: Diagnosis not present

## 2022-10-28 DIAGNOSIS — Z136 Encounter for screening for cardiovascular disorders: Secondary | ICD-10-CM | POA: Diagnosis not present

## 2022-10-28 DIAGNOSIS — R42 Dizziness and giddiness: Secondary | ICD-10-CM | POA: Diagnosis not present

## 2022-10-28 DIAGNOSIS — R002 Palpitations: Secondary | ICD-10-CM | POA: Diagnosis not present

## 2022-10-28 NOTE — Telephone Encounter (Signed)
Called pt's daughter advised her to have her mother check her blood pressure when she has his episodes to gauge where her numbers are at that time. Advised to bring the records with them to the visit Monday. She verbalized understanding. Will get message to Dr. Percival Spanish to see if he has recommendations at this time.

## 2022-10-28 NOTE — Telephone Encounter (Signed)
Called pt daughter to relay Dr. Rosezella [place] message. No answer. Left message on her machine.

## 2022-10-28 NOTE — Telephone Encounter (Signed)
New Message:      Daughter called and said she took the patient went to a walk in clinic today. She had a fall a week ago. They told her that her heart rate today was 48 and to contact her Cardiologist..    STAT if HR is under 50 or over 120 (normal HR is 60-100 beats per minute)  What is your heart rate? 48  Do you have a log of your heart rate readings (document readings)?   Do you have any other symptoms? Wakes up in the middle of the night and says she can not breathe. When she stands she feels dizzy. I made her an appointment on 11-01-22 with Laurann Montana. Please call to evaluate.

## 2022-10-31 NOTE — Progress Notes (Signed)
Cardiology Office Note:    Date:  11/01/2022   ID:  Bonnie [name], DOB 19-Jul-1941, MRN [id number]  PCP:  Tammi Sou, MD   [facility] Cardiologist:  Minus Breeding, MD     Referring MD: Tammi Sou, MD   No chief complaint on file.   History of Present Illness:    Bonnie [name] is a 82 y.o. female with a hx of coronary artery disease (NSTEMI 2019, small vessel occlusion not amenable to intervention), diastolic dysfunction, GERD, hyperlipidemia, hypothyroidism, memory difficulties, MGUS, palpitations, rheumatoid arthritis.  She initially presented to our office in 2019 for evaluations of chest pain, she was in the hospital and had a heavy midsternal pressure associated with nausea.  Her EKG in the ED was abnormal with inverted T waves in leads III.  She had a cardiac cath on 08/08/2018 revealing two-vessel disease with 60 to 65% in the M LAD and mRCA.  No intervention was required but she was treated with DAPT (aspirin and Brilinta).   She wore an event monitor in 2019, that showed no significant arrhythmias, symptoms of rapid heartbeat, lightheadedness, shortness of breath occurred during normal sinus rhythm.  She wore a monitor for palpitations in August 2021, this revealed no sustained arrhythmias, brief runs of SVT, no changes were made to her plan of care.  She was last evaluated in our office with Dr. Percival Spanish on 01/13/2022, at that time she had complaints of dizziness, she described it as feeling as if she was on a boat, dizziness did not occur at the same time that she has palpitations.  Blood pressure was controlled at this visit 132/64, heart rate 49 bpm.  The dizziness was not related to orthostasis, advise she should follow-up with her PCP as it could be somehow related to inner ear.  Repeat echo was obtained which showed EF of 60 to 65%, mild LVH, mild MR, mild AI.  Her daughter called our triage line to report that her heart rate was 48 and  her PCP instructed her to call her cardiologist.  She has some associated dizziness as well as a recent fall.  She presents today accompanied by her husband and her daughter for follow-up after recent visit to urgent care where it was noted that her heart rate was low.  She went to the urgent care following a fall with, where she tripped over her husband's foot, denied loss of consciousness, denied associated dizziness with the fall.  She states overall that she feels good, endorses palpitations that are bothersome to her at night, and have awoken her from sleep.  She has also had a few episodes of waking up feeling short of breath however she states it is related to an increased temperature in the house and she is able to fall back asleep relatively fast.  She occasionally feels dizzy when she first stands after sitting for prolonged period of time, this has been persistent for several years and is not worsening.  She has pitting edema that typically resolves with elevation of her legs, she has been instructed to wear compression socks in the past however she did not feel this was necessary.  She denies chest pain, dyspnea, pnd, orthopnea, n, v, syncope, weight gain, or early satiety.   Past Medical History:  Diagnosis Date   Beta thalassemia, heterozygous    ?alpha? pt does not recall.   CAD (coronary artery disease) 07/2018   NSTEMI (small vessel occlusion, not amenable to intervention)-->2V  CAD on cath, preserved LV systolic fxn.   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  Surgical path NEG for atypica or  malignancy.   Memory difficulties 12/25/2020   MGUS (monoclonal gammopathy of unknown significance) 2022   Dr. Marin Olp eval 02/2021-->he feels it is likely reactive d/t her RA.  Low suspicion of plasma cell dyscrasia-->f/u 30mo   NSTEMI (non-ST elevated myocardial infarction) [place] 07[place]   Dr. HPercival Spanish  Osteoporosis 01/2021   01/2021 T scoree -2.7   Palpitations    08/2018 Event monitor normal. Long term event monitor 05/2020 w/out signif arrhythmia--some brief SVT.   Rheumatoid arthritis ([facility] 2007   muscle weaknes at times (Dr. BAmil Amen   Tinnitus, bilateral 2021   with high frequ SSHL->reassured by Ent    Past Surgical History:  Procedure Laterality Date   ABDOMINAL HYSTERECTOMY  1987   DUB, no malignancy.  Ovaries still in.   BLADDER REPAIR     Cardiac Event Monitor  08/22/2018   NO arrhythmias.  Her symptomatic periods coresponded to NSR.   CHOLECYSTECTOMY  1969   COLONOSCOPY  11/07/2017   2014 adenoma.  Rpt 10/2017 adenoma x 1.  No further colonoscopies needed per GI.    DEXA  01/2021   T score -2.7   LEFT HEART CATH AND CORONARY ANGIOGRAPHY N/A 08/08/2018   Procedure: LEFT HEART CATH AND CORONARY ANGIOGRAPHY;  Surgeon: KTroy Sine MD;  [facility];  Service: Cardiovascular;  Laterality: N/A;   THYROIDECTOMY  2012   cytologic atypia on nodule bx; surgical path NEG for atypica or malignancy.   TRANSTHORACIC ECHOCARDIOGRAM  08/07/2018   2019 EF 60-65%, no wall motion abnl, grd II DD, mod MR.  01/2022 mild AI and MR   UPPER GASTROINTESTINAL ENDOSCOPY     VAGINAL PROLAPSE REPAIR  2012    Current Medications: Current Meds  Medication Sig   acetaminophen (TYLENOL) 500 MG tablet Take 500 mg by mouth every 6 (six) hours as needed for headache (pain).   alendronate (FOSAMAX) 70 MG tablet TAKE 1 TABLET BY MOUTH ONCE A WEEK. TAKE WITH A FULL GLASS OF WATER ON AN EMPTY STOMACH.   aspirin EC 81 MG tablet Take 81 mg by mouth at bedtime.   Calcium  Citrate-Vitamin D (CALCIUM CITRATE + D PO) Take 1 tablet by mouth 2 (two) times daily.    celecoxib (CELEBREX) 200 MG capsule Take by mouth daily as needed.   Coenzyme Q10 (CO Q 10) 60 MG CAPS 1 capsule with a meal   diphenhydrAMINE (BENADRYL) 25 mg capsule 1 capsule as needed   escitalopram (LEXAPRO) 5 MG tablet Take 1 tablet by mouth daily.   fexofenadine (ALLEGRA) 199991111MG tablet    folic acid (FOLVITE) 1 MG tablet Take 1 mg by mouth 2 (two) times daily.   isosorbide mononitrate (IMDUR) 60 MG 24 hr tablet TAKE 1 TABLET BY MOUTH EVERY DAY   LORazepam (ATIVAN) 0.5 MG tablet 1-2 tabs po bid prn anxiety   LORazepam (ATIVAN) 0.5 MG tablet Take by mouth.   methotrexate 2.5 MG tablet Take 12.5  mg by mouth every Sunday.    Multiple Vitamin (MULTIVITAMIN WITH MINERALS) TABS tablet Take 1 tablet by mouth daily.   nitroGLYCERIN (NITROSTAT) 0.4 MG SL tablet Place 1 tablet (0.4 mg total) under the tongue every 5 (five) minutes x 3 doses as needed for chest pain.   Omega-3 Fatty Acids (FISH OIL PO) Take by mouth daily.   potassium chloride (KLOR-CON) 8 MEQ tablet Take 2 tablets (16 mEq total) by mouth daily.   rosuvastatin (CRESTOR) 40 MG tablet TAKE 1 TABLET BY MOUTH DAILY AT 6 PM.   SYNTHROID 88 MCG tablet TAKE 1 TABLET BY MOUTH DAILY BEFORE BREAKFAST.     Allergies:   Bacitracin-polymyxin b, Donepezil, Neosporin [neomycin-bacitracin zn-polymyx], Niacin, Other, Seasonal ic [cholestatin], Triamcinolone, Triamcinolone acetonide, Atorvastatin, Monascus purpureus went yeast, Pitavastatin, and Pravastatin   Social History   Socioeconomic History   Marital status: Married    Spouse name: Evette Doffing   Number of children: 5   Years of education: Not on file   Highest education level: Not on file  Occupational History   Not on file  Tobacco Use   Smoking status: Former    Packs/day: 0.50    Years: 10.00    Total pack years: 5.00    Types: Cigarettes    Quit date: 11/19/1973    Years since quitting: 48.9    Smokeless tobacco: Never  Vaping Use   Vaping Use: Never used  Substance and Sexual Activity   Alcohol use: Yes    Comment: very rare glass of wine ETOH use - wine with dinner- ,   Drug use: No   Sexual activity: Yes  Other Topics Concern   Not on file  Social History Narrative   Patient is an Optometrist , retired from  [employer], school aid,Lives with husband in [place].   Right Handed   Drinks 4-5 cups caffeine daily   Tobacco use former.  quit 50 years ago ,   ETOH use - wine with dinner- ,    Caffeine intake in form of Coffee( 2 cups in AM  ) Soda( when eating out) Tea ( decaff) or energy drinks.   Regular exercise in form of walking .     Hobbies :reading, volunteering.    Social Determinants of Health   Financial Resource Strain: Low Risk  (09/29/2022)   Overall Financial Resource Strain (CARDIA)    Difficulty of Paying Living Expenses: Not very hard  Food Insecurity: No Food Insecurity (09/29/2022)   Hunger Vital Sign    Worried About Running Out of Food in the Last Year: Never true    Ran Out of Food in the Last Year: Never true  Transportation Needs: No Transportation Needs (09/29/2022)   PRAPARE - Hydrologist (Medical): No    Lack of Transportation (Non-Medical): No  Physical Activity: Insufficiently Active (09/29/2022)   Exercise Vital Sign    Days of Exercise per Week: 4 days    Minutes of Exercise per Session: 10 min  Stress: No Stress Concern Present (09/29/2022)   [place]    Feeling of Stress : Not at all  Social Connections: [place] (09/29/2022)   Social Connection and Isolation Panel [NHANES]    Frequency of Communication with Friends and Family: More than three times a week    Frequency of Social Gatherings with Friends and Family: More than three times a week    Attends Religious Services: 1 to  4 times per year    Active Member of Clubs or  Organizations: Yes    Attends Archivist Meetings: 1 to 4 times per year    Marital Status: Married     Family History: The patient's family history includes CVA in her mother; Hypertension in her mother. There is no history of Colon cancer, Esophageal cancer, Stomach cancer, or Rectal cancer.  ROS:   Please see the history of present illness.    All other systems reviewed and are negative.  EKGs/Labs/Other Studies Reviewed:    The following studies were reviewed today:   Post Atrio lesion is 100% stenosed. Mid RCA lesion is 20% stenosed. Prox LAD to Mid LAD lesion is 60% stenosed. LV end diastolic pressure is normal.   Preserved global LV function with EF 55% and a focal area of mild mid posterior hypokinesis. LVEDP 14 mm Hg.   Two-vessel coronary artery disease with smooth 60 to 65% mid LAD stenosis which did not significantly improve following IC nitroglycerin administration, a normal large left circumflex coronary artery; and a dominant RCA with smooth 20% mid stenosis and a suggestion of abrupt cut off the in the mid posterior lateral vessel which was small caliber.   RECOMMENDATION: The patient's wall motion abnormality and inferior T wave inversion is consistent with the mid posterior lateral abrupt cut off occlusion.  Although the LAD lesion is 60 to 65%, it is not felt to be the "culprit " vessel as suggested by the mid posterior wall motion abnormality. In this patient with ACS and positive troponin to 15.61, recommend dual antiplatelet therapy for minimum of 1 year.  Medical therapy for concomitant CAD.  Aggressive lipid-lowering therapy with target LDL less than 70.  EKG:  EKG is not ordered today.    Recent Labs: 02/10/2022: ALT 11; BUN 16; Creatinine, Ser 0.90; Hemoglobin 12.3; Platelets 229.0; Potassium 4.0; Sodium 141; TSH 0.78  Recent Lipid Panel    Component Value Date/Time   CHOL 143 02/10/2022 1006   TRIG 57.0 02/10/2022 1006   HDL 71.90 02/10/2022  1006   CHOLHDL 2 02/10/2022 1006   VLDL 11.4 02/10/2022 1006   LDLCALC 59 02/10/2022 1006   LDLCALC 52 10/12/2019 1357   LDLDIRECT 155.5 12/04/2012 1123     Risk Assessment/Calculations:                Physical Exam:    VS:  BP 132/88   Pulse (!) 57   Ht 5' 7"$  (1.702 m)   Wt 128 lb (58.1 kg)   BMI 20.05 kg/m     Wt Readings from Last 3 Encounters:  11/01/22 128 lb (58.1 kg)  09/29/22 128 lb (58.1 kg)  07/20/22 132 lb 12.8 oz (60.2 kg)     GEN:  Well nourished, well developed in no acute distress HEENT: Normal NECK: No JVD; No carotid bruits LYMPHATICS: No lymphadenopathy CARDIAC: RRR, no murmurs, rubs, gallops RESPIRATORY:  Clear to auscultation without rales, wheezing or rhonchi  ABDOMEN: Soft, non-tender, non-distended MUSCULOSKELETAL: +1 pitting edema noted at sock line,  no deformity  SKIN: Warm and dry NEUROLOGIC:  Alert and oriented x 3 PSYCHIATRIC:  Normal affect   ASSESSMENT:    1. Coronary artery disease involving native coronary artery of native heart without angina pectoris   2. Dizziness   3. Bradycardia   4. Palpitations   5. Dyslipidemia   6. Nonrheumatic mitral valve regurgitation   7. Nonrheumatic aortic valve insufficiency   8. Hypothyroidism, postsurgical  9. Pedal edema    PLAN:    In order of problems listed above:  Coronary artery disease - Stable with no anginal symptoms. No indication for ischemic evaluation. No beta blocker d/t bradycardia. Continue aspirin, isosorbide, Crestor.  Dizziness -  persistent, occurs typically when changing positions after being stationary for some period of time, no worse and no better than it typically is.  Echo 01/2022 normal LVEF, mild MR, plan for Zio. Discussed her fluid intake, she traditionally drinks coffee and tea throughout the day, may be 12 ounce glass of water.  Encouraged her to increase her noncaffeinated intake.  Will repeat CBC to rule out anemia. Bradycardia - heart rate today is 57,  denies dizziness or orthostasis today, she has been consistently bradycardic over the last few years, continue to avoid nodal blocking agents Palpitations - she previously wore a monitor in 2021 which did not show any concerning arrhythmias.  She states her palpitations now awaken her at times.  Will repeat 7-day monitor.  Will check CBC, BMet, TSH, mag. Dyslipidemia - LDL 59 on 02/10/2022, currently well-controlled, continue Crestor Mild mitral valve regurgitation/mild aortic valve insufficiency- mild on recent echo in May 2023, no indication to repeat echo today  Hypothyroidism -TSH was 0.78 on 02/10/2022, will repeat TSH today Pedal edema -echo in 2023 showed LVEF 60 to 65%, typically the edema resolves with elevation of legs or overnight, she has been advised to wear compression socks in the past, encouraged her to keep her legs propped up and wear compression socks during the day, likely etiology venous insufficeincy.  Disposition-repeat 7-day monitor, check CBC, BMET, TSH, mag. Increase water intake. Compression socks for edema.  Return in 6 weeks.          Medication Adjustments/Labs and Tests Ordered: Current medicines are reviewed at length with the patient today.  Concerns regarding medicines are outlined above.  Orders Placed This Encounter  Procedures   TSH   CBC   Basic metabolic panel   Magnesium   LONG TERM MONITOR (3-14 DAYS)   No orders of the defined types were placed in this encounter.   Patient Instructions  Medication Instructions:  The current medical regimen is effective;  continue present plan and medications.   *If you need a refill on your cardiac medications before your next appointment, please call your pharmacy*   Lab Work: Basic Metabolic Panel, CBC, TSH, Magnesium  If you have labs (blood work) drawn today and your tests are completely normal, you will receive your results only by: [place] (if you have MyChart) OR A paper copy in the mail If  you have any lab test that is abnormal or we need to change your treatment, we will call you to review the results.   Testing/Procedures: [place]- Long Term Monitor Instructions  Your physician has requested you wear a ZIO patch monitor for 14 days.  This is a single patch monitor. [employer] supplies one patch monitor per enrollment. Additional stickers are not available. Please do not apply patch if you will be having a Nuclear Stress Test,  Echocardiogram, Cardiac CT, MRI, or Chest Xray during the period you would be wearing the  monitor. The patch cannot be worn during these tests. You cannot remove and re-apply the  ZIO XT patch monitor.  Your ZIO patch monitor will be mailed 3 day USPS to your address on file. It may take 3-5 days  to receive your monitor after you have been enrolled.  Once you  have received your monitor, please review the enclosed instructions. Your monitor  has already been registered assigning a specific monitor serial # to you.  Billing and Patient Assistance Program Information  We have supplied [employer] with any of your insurance information on file for billing purposes. [employer] offers a sliding scale Patient Assistance Program for patients that do not have  insurance, or whose insurance does not completely cover the cost of the ZIO monitor.  You must apply for the Patient Assistance Program to qualify for this discounted rate.  To apply, please call [employer] at [phone], select option 4, select option 2, ask to apply for  Patient Assistance Program. Theodore Demark will ask your household income, and how many people  are in your household. They will quote your out-of-pocket cost based on that information.  [employer] will also be able to set up a 4-month interest-free payment plan if needed.  Applying the monitor  Do not shower for the first 24 hours. You may shower after the first 24 hours.  Press the button if you feel a symptom. You will hear a small click. Record  Date, Time and  Symptom in the Patient Logbook.  When you are ready to remove the patch, follow instructions on the last 2 pages of Patient  Logbook. Stick patch monitor onto the last page of Patient Logbook.  Place Patient Logbook in the blue and white box. Use locking tab on box and tape box closed  securely. The blue and white box has prepaid postage on it. Please place it in the mailbox as  soon as possible. Your physician should have your test results approximately 7 days after the  monitor has been [facility]  Call IMarydelat [phone] you have questions regarding  your ZIO XT patch monitor. Call them immediately if you see an orange light blinking on your  monitor.  If your monitor falls off in less than 4 days, contact our Monitor department at [phone]  If your monitor becomes loose or falls off after 4 days call [employer] at [phone]  suggestions on securing your monitor 7 day monitor.   Follow-Up: [facility] and your health needs are our priority.  As part of our continuing mission to provide you with exceptional heart care, we have created designated Provider Care Teams.  These Care Teams include your primary Cardiologist (physician) and Advanced Practice Providers (APPs -  Physician Assistants and Nurse Practitioners) who all work together to provide you with the care you need, when you need it.  We recommend signing up for the patient portal called "MyChart".  Sign up information is provided on this After Visit Summary.  MyChart is used to connect with patients for Virtual Visits (Telemedicine).  Patients are able to view lab/test results, encounter notes, upcoming appointments, etc.  Non-urgent messages can be sent to your provider as well.   To learn more about what you can do with MyChart, go to [email or web address]    Your next appointment:   6 week(s)  Provider:   CLaurann Montana NP     Other Instructions  Increase intake of water.   To prevent or reduce lower extremity swelling: Eat a low salt diet. Salt makes the body hold onto extra fluid which causes swelling. Sit with legs elevated. For example, in the recliner or on an o[place]  Wear knee-high compression stockings during the daytime. Ones labeled 15-20 mmHg provide good compression.    Signed,  Trudi Ida, NP  11/01/2022 9:12 AM    Bonnie [name]

## 2022-11-01 ENCOUNTER — Encounter (HOSPITAL_BASED_OUTPATIENT_CLINIC_OR_DEPARTMENT_OTHER): Payer: Self-pay | Admitting: Cardiology

## 2022-11-01 ENCOUNTER — Ambulatory Visit (INDEPENDENT_AMBULATORY_CARE_PROVIDER_SITE_OTHER): Payer: Medicare Other | Admitting: Cardiology

## 2022-11-01 ENCOUNTER — Other Ambulatory Visit (INDEPENDENT_AMBULATORY_CARE_PROVIDER_SITE_OTHER): Payer: Medicare Other

## 2022-11-01 VITALS — BP 132/88 | HR 57 | Ht 67.0 in | Wt 128.0 lb

## 2022-11-01 DIAGNOSIS — R001 Bradycardia, unspecified: Secondary | ICD-10-CM

## 2022-11-01 DIAGNOSIS — E890 Postprocedural hypothyroidism: Secondary | ICD-10-CM | POA: Diagnosis not present

## 2022-11-01 DIAGNOSIS — R600 Localized edema: Secondary | ICD-10-CM | POA: Diagnosis not present

## 2022-11-01 DIAGNOSIS — R42 Dizziness and giddiness: Secondary | ICD-10-CM

## 2022-11-01 DIAGNOSIS — E785 Hyperlipidemia, unspecified: Secondary | ICD-10-CM

## 2022-11-01 DIAGNOSIS — I2510 Atherosclerotic heart disease of native coronary artery without angina pectoris: Secondary | ICD-10-CM | POA: Diagnosis not present

## 2022-11-01 DIAGNOSIS — I340 Nonrheumatic mitral (valve) insufficiency: Secondary | ICD-10-CM

## 2022-11-01 DIAGNOSIS — R002 Palpitations: Secondary | ICD-10-CM

## 2022-11-01 DIAGNOSIS — I351 Nonrheumatic aortic (valve) insufficiency: Secondary | ICD-10-CM

## 2022-11-01 NOTE — Patient Instructions (Addendum)
Medication Instructions:  The current medical regimen is effective;  continue present plan and medications.   *If you need a refill on your cardiac medications before your next appointment, please call your pharmacy*   Lab Work: Basic Metabolic Panel, CBC, TSH, Magnesium  If you have labs (blood work) drawn today and your tests are completely normal, you will receive your results only by: [place] (if you have MyChart) OR A paper copy in the mail If you have any lab test that is abnormal or we need to change your treatment, we will call you to review the results.   Testing/Procedures: [place]- Long Term Monitor Instructions  Your physician has requested you wear a ZIO patch monitor for 14 days.  This is a single patch monitor. [employer] supplies one patch monitor per enrollment. Additional stickers are not available. Please do not apply patch if you will be having a Nuclear Stress Test,  Echocardiogram, Cardiac CT, MRI, or Chest Xray during the period you would be wearing the  monitor. The patch cannot be worn during these tests. You cannot remove and re-apply the  ZIO XT patch monitor.  Your ZIO patch monitor will be mailed 3 day USPS to your address on file. It may take 3-5 days  to receive your monitor after you have been enrolled.  Once you have received your monitor, please review the enclosed instructions. Your monitor  has already been registered assigning a specific monitor serial # to you.  Billing and Patient Assistance Program Information  We have supplied [employer] with any of your insurance information on file for billing purposes. [employer] offers a sliding scale Patient Assistance Program for patients that do not have  insurance, or whose insurance does not completely cover the cost of the ZIO monitor.  You must apply for the Patient Assistance Program to qualify for this discounted rate.  To apply, please call [employer] at [phone], select option 4, select option  2, ask to apply for  Patient Assistance Program. Theodore Demark will ask your household income, and how many people  are in your household. They will quote your out-of-pocket cost based on that information.  [employer] will also be able to set up a 4-month interest-free payment plan if needed.  Applying the monitor  Do not shower for the first 24 hours. You may shower after the first 24 hours.  Press the button if you feel a symptom. You will hear a small click. Record Date, Time and  Symptom in the Patient Logbook.  When you are ready to remove the patch, follow instructions on the last 2 pages of Patient  Logbook. Stick patch monitor onto the last page of Patient Logbook.  Place Patient Logbook in the blue and white box. Use locking tab on box and tape box closed  securely. The blue and white box has prepaid postage on it. Please place it in the mailbox as  soon as possible. Your physician should have your test results approximately 7 days after [facility]  [facility]  Call [facility] [phone] you have questions regarding  your ZIO XT patch monitor. Call them immediately if you see an orange light blinking on your  monitor.  If your monitor falls off in less than 4 days, contact our Monitor department at [phone]  If your monitor becomes loose or falls off after 4 days call [employer] at [phone]  suggestions on securing your monitor 7 day monitor.  Follow-Up: At [facility], you and your health needs are our priority.  As part of our continuing mission to provide you with exceptional heart care, we have created designated Provider Care Teams.  These Care Teams include your primary Cardiologist (physician) and Advanced Practice Providers (APPs -  Physician Assistants and Nurse Practitioners) who all work together to provide you with the care you need, when you need it.  We recommend signing up for the patient  portal called "MyChart".  Sign up information is provided on this After Visit Summary.  MyChart is used to connect with patients for Virtual Visits (Telemedicine).  Patients are able to view lab/test results, encounter notes, upcoming appointments, etc.  Non-urgent messages can be sent to your provider as well.   To learn more about what you can do with MyChart, go to [email or web address].    Your next appointment:   6 week(s)  Provider:   Laurann Montana, NP    Other Instructions  Increase intake of water.   To prevent or reduce lower extremity swelling: Eat a low salt diet. Salt makes the body hold onto extra fluid which causes swelling. Sit with legs elevated. For example, in the recliner or on an [place].  Wear knee-high compression stockings during the daytime. Ones labeled 15-20 mmHg provide good compression.

## 2022-11-02 LAB — CBC
Hematocrit: 36.8 % (ref 34.0–46.6)
Hemoglobin: 12.2 g/dL (ref 11.1–15.9)
MCH: 31.7 pg (ref 26.6–33.0)
MCHC: 33.2 g/dL (ref 31.5–35.7)
MCV: 96 fL (ref 79–97)
Platelets: 268 10*3/uL (ref 150–450)
RBC: 3.85 x10E6/uL (ref 3.77–5.28)
RDW: 15.2 % (ref 11.7–15.4)
WBC: 6.4 10*3/uL (ref 3.4–10.8)

## 2022-11-02 LAB — BASIC METABOLIC PANEL
BUN/Creatinine Ratio: 11 — ABNORMAL LOW (ref 12–28)
BUN: 10 mg/dL (ref 8–27)
CO2: 23 mmol/L (ref 20–29)
Calcium: 9.3 mg/dL (ref 8.7–10.3)
Chloride: 106 mmol/L (ref 96–106)
Creatinine, Ser: 0.87 mg/dL (ref 0.57–1.00)
Glucose: 81 mg/dL (ref 70–99)
Potassium: 4.4 mmol/L (ref 3.5–5.2)
Sodium: 144 mmol/L (ref 134–144)
eGFR: 67 mL/min/{1.73_m2} (ref >59)

## 2022-11-02 LAB — TSH: TSH: 1.81 u[IU]/mL (ref 0.450–4.500)

## 2022-11-02 LAB — MAGNESIUM: Magnesium: 2.5 mg/dL — ABNORMAL HIGH (ref 1.6–2.3)

## 2022-11-04 ENCOUNTER — Ambulatory Visit (INDEPENDENT_AMBULATORY_CARE_PROVIDER_SITE_OTHER): Payer: Medicare Other | Admitting: Family Medicine

## 2022-11-04 ENCOUNTER — Encounter: Payer: Self-pay | Admitting: Family Medicine

## 2022-11-04 ENCOUNTER — Ambulatory Visit (HOSPITAL_BASED_OUTPATIENT_CLINIC_OR_DEPARTMENT_OTHER)
Admission: RE | Admit: 2022-11-04 | Discharge: 2022-11-04 | Disposition: A | Discharge status: Home / Self Care | Payer: Medicare Other | Source: Ambulatory Visit | Attending: Family Medicine | Admitting: Family Medicine

## 2022-11-04 VITALS — Ht 67.0 in | Wt 128.0 lb

## 2022-11-04 DIAGNOSIS — M1611 Unilateral primary osteoarthritis, right hip: Secondary | ICD-10-CM | POA: Diagnosis not present

## 2022-11-04 DIAGNOSIS — R1031 Right lower quadrant pain: Secondary | ICD-10-CM | POA: Diagnosis not present

## 2022-11-04 DIAGNOSIS — M25551 Pain in right hip: Secondary | ICD-10-CM | POA: Diagnosis not present

## 2022-11-04 DIAGNOSIS — M169 Osteoarthritis of hip, unspecified: Secondary | ICD-10-CM | POA: Insufficient documentation

## 2022-11-04 NOTE — Progress Notes (Signed)
Bonnie [name] - 82 y.o. female MRN [id number]  Date of birth: 12-11-1940  SUBJECTIVE:  Including CC & ROS.  No chief complaint on file.   Bonnie [name] is a 82 y.o. female that is presenting with right groin pain.  The pain has been ongoing for the past 2 to 3 weeks after a fall.  Continues to limp.  No history of similar pain.  Was previously seen at urgent care.    Review of Systems See HPI   HISTORY: Past Medical, Surgical, Social, and Family History Reviewed & Updated per EMR.   Pertinent Historical Findings include:  Past Medical History:  Diagnosis Date   Beta thalassemia, heterozygous    ?alpha? pt does not recall.   CAD (coronary artery disease) 07/2018   NSTEMI (small vessel occlusion, not amenable to intervention)-->2V CAD on cath, preserved LV systolic fxn.   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  Surgical path NEG for atypica or malignancy.   Memory difficulties 12/25/2020   MGUS (monoclonal gammopathy of unknown significance) 2022   Dr. Marin Olp eval 02/2021-->he feels it is likely reactive d/t her RA.  Low suspicion of plasma cell dyscrasia-->f/u 40mo   [place] (non-ST elevated [place]) [place]   Dr. HPercival Spanish  Osteoporosis 01/2021   01/2021 T scoree -2.7   Palpitations    08/2018 Event monitor normal. Long term event monitor 05/2020 w/out signif arrhythmia--some brief SVT.   [place]   muscle weaknes at times (Dr. BAmil Amen   Tinnitus, bilateral 2021   with high frequ SSHL->reassured by Ent    Past Surgical History:  Procedure  Laterality Date   ABDOMINAL HYSTERECTOMY  1987   DUB, no malignancy.  Ovaries still in.   BLADDER REPAIR     Cardiac Event Monitor  08/22/2018   NO arrhythmias.  Her symptomatic periods coresponded to NSR.   CHOLECYSTECTOMY  1969   COLONOSCOPY  11/07/2017   2014 adenoma.  Rpt 10/2017 adenoma x 1.  No further colonoscopies needed per GI.    DEXA  01/2021   T score -2.7   LEFT HEART CATH AND CORONARY ANGIOGRAPHY N/A 08/08/2018   Procedure: LEFT HEART CATH AND CORONARY ANGIOGRAPHY;  Surgeon: KTroy Sine MD;  [facility];  Service: Cardiovascular;  Laterality: N/A;   THYROIDECTOMY  2012   cytologic atypia on nodule bx; surgical path NEG for atypica or malignancy.   TRANSTHORACIC ECHOCARDIOGRAM  08/07/2018   2019 EF 60-65%, no wall motion abnl, grd II DD, mod MR.  01/2022 mild AI and MR   UPPER GASTROINTESTINAL ENDOSCOPY     VAGINAL PROLAPSE REPAIR  2012     PHYSICAL EXAM:  VS: Ht 5' 7"$  (1.702 m)   Wt 128 lb (58.1 kg)   BMI 20.05 kg/m  Physical Exam Gen: NAD, alert, cooperative with exam, well-appearing MSK:  Neurovascularly intact       ASSESSMENT & PLAN:   OA (osteoarthritis) of hip Acutely occurring after recent fall.  Continues to have pain in the groin as well as walking with a limp.  Concern for pelvic or hip fracture. -Counseled on home exercise therapy and supportive care. -X-ray.  Independent review of the hip x-ray shows no acute fractures but does show degenerative changes that are likely exacerbated from her fall. -Could consider injection, further imaging or physical therapy.

## 2022-11-04 NOTE — Assessment & Plan Note (Addendum)
Acutely occurring after recent fall.  Continues to have pain in the groin as well as walking with a limp.  Concern for pelvic or hip fracture. -Counseled on home exercise therapy and supportive care. -X-ray.  Independent review of the hip x-ray shows no acute fractures but does show degenerative changes that are likely exacerbated from her fall. -Could consider injection, further imaging or physical therapy.

## 2022-11-18 DIAGNOSIS — R002 Palpitations: Secondary | ICD-10-CM | POA: Diagnosis not present

## 2022-11-18 DIAGNOSIS — R42 Dizziness and giddiness: Secondary | ICD-10-CM | POA: Diagnosis not present

## 2022-11-22 ENCOUNTER — Encounter: Payer: Self-pay | Admitting: Family Medicine

## 2022-11-22 ENCOUNTER — Telehealth (HOSPITAL_BASED_OUTPATIENT_CLINIC_OR_DEPARTMENT_OTHER): Payer: Self-pay

## 2022-11-22 ENCOUNTER — Ambulatory Visit (INDEPENDENT_AMBULATORY_CARE_PROVIDER_SITE_OTHER): Payer: Medicare Other | Admitting: Family Medicine

## 2022-11-22 VITALS — BP 118/60 | Ht 67.0 in | Wt 127.0 lb

## 2022-11-22 DIAGNOSIS — M1611 Unilateral primary osteoarthritis, right hip: Secondary | ICD-10-CM | POA: Diagnosis not present

## 2022-11-22 NOTE — Telephone Encounter (Signed)
-----   Message from Trudi Ida, NP sent at 11/21/2022 12:28 PM EDT ----- Bonnie [name], We received the results from your monitor, it showed that you are predominantly in a normal but slow rhythm, which we already knew. You did have some extra heart beats that you probably did feel, but there were not worrisome from a cardiac perspective. Typically, if they are bothersome we would try giving you a medication that slows your heart rate down so they are less bothersome, but since your heart rate is already slow that would not be an option for you. They are not worrisome, but may be notable to you.  Best, [name]

## 2022-11-22 NOTE — Patient Instructions (Signed)
Good to see you Please alternate heat and ice  Please try the exercises  We have made a referral to physical therapy   Please send me a message in MyChart with any questions or updates.  Please see me back as needed.   --Dr. Raeford Razor

## 2022-11-22 NOTE — Progress Notes (Signed)
Bonnie [name] - 82 y.o. female MRN [id number]  Date of birth: [birth year]  SUBJECTIVE:  Including CC & ROS.  No chief complaint on file.   Bonnie [name] is a 82 y.o. female that is following up for her right hip pain.  Her pain has improved where she is not as limping as severe.  She does have pain that is worse at the end of the day.  Still feels the pain in the right groin.   Review of Systems See HPI   HISTORY: Past Medical, Surgical, Social, and Family History Reviewed & Updated per EMR.   Pertinent Historical Findings include:  Past Medical History:  Diagnosis Date   Beta thalassemia, heterozygous    ?alpha? pt does not recall.   CAD (coronary artery disease) 07/2018   NSTEMI (small vessel occlusion, not amenable to intervention)-->2V CAD on cath, preserved LV systolic fxn.   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  Surgical path NEG for atypica or malignancy.   Memory difficulties 12/25/2020   MGUS (monoclonal gammopathy of unknown significance) 2022   Dr. Marin Olp eval 02/2021-->he feels it is likely reactive d/t her RA.  Low suspicion of plasma cell dyscrasia-->f/u 91mo   [place] (non-ST elevated [place] infarction) [place]   Dr. HPercival Spanish  Osteoporosis 01/2021   01/2021 T scoree -2.7   Palpitations    08/2018 Event monitor normal. Long term event monitor 05/2020 w/out signif arrhythmia--some brief SVT.   [place]   muscle weaknes at times (Dr. BAmil Amen   Tinnitus, bilateral 2021   with high frequ SSHL->reassured by Ent    Past Surgical History:   Procedure Laterality Date   ABDOMINAL HYSTERECTOMY  1987   DUB, no malignancy.  Ovaries still in.   BLADDER REPAIR     Cardiac Event Monitor  08/22/2018   NO arrhythmias.  Her symptomatic periods coresponded to NSR.   CHOLECYSTECTOMY  1969   COLONOSCOPY  11/07/2017   2014 adenoma.  Rpt 10/2017 adenoma x 1.  No further colonoscopies needed per GI.    DEXA  01/2021   T score -2.7   LEFT HEART CATH AND CORONARY ANGIOGRAPHY N/A 08/08/2018   Procedure: LEFT HEART CATH AND CORONARY ANGIOGRAPHY;  Surgeon: KTroy Sine MD;  [facility];  Service: Cardiovascular;  Laterality: N/A;   THYROIDECTOMY  2012   cytologic atypia on nodule bx; surgical path NEG for atypica or malignancy.   TRANSTHORACIC ECHOCARDIOGRAM  08/07/2018   2019 EF 60-65%, no wall motion abnl, grd II DD, mod MR.  01/2022 mild AI and MR   UPPER GASTROINTESTINAL ENDOSCOPY     VAGINAL PROLAPSE REPAIR  2012     PHYSICAL EXAM:  VS: BP 118/60   Ht '5\' 7"'$  (1.702 m)   Wt 127 lb (57.6 kg)   BMI 19.89 kg/m  Physical Exam Gen: NAD, alert, cooperative with exam, well-appearing MSK:  Neurovascularly intact       ASSESSMENT & PLAN:   OA (osteoarthritis) of hip She is having improvement of her right groin pain.  Most consistent  with degenerative changes appreciated within her hip. -Counseled on home exercise therapy and supportive care. -Referral to physical therapy. -Could consider injection.

## 2022-11-22 NOTE — Assessment & Plan Note (Signed)
She is having improvement of her right groin pain.  Most consistent with degenerative changes appreciated within her hip. -Counseled on home exercise therapy and supportive care. -Referral to physical therapy. -Could consider injection.

## 2022-11-25 DIAGNOSIS — M25551 Pain in right hip: Secondary | ICD-10-CM | POA: Diagnosis not present

## 2022-11-29 DIAGNOSIS — M25551 Pain in right hip: Secondary | ICD-10-CM | POA: Diagnosis not present

## 2022-11-30 ENCOUNTER — Telehealth (HOSPITAL_BASED_OUTPATIENT_CLINIC_OR_DEPARTMENT_OTHER): Payer: Self-pay | Admitting: *Deleted

## 2022-11-30 NOTE — Telephone Encounter (Signed)
Bonnie Dubonnet, NP 11/02/2022  7:33 AM EST     Normal kidney function and electrolytes.  Magnesium mildly elevated, not of concern.  Thyroid normal.  CBC no anemia nor infection.  No abnormalities that would cause palpitations or dizziness.     Bonnie Ida, NP 11/21/2022 12:28 PM EDT     [name], We received the results from your monitor, it showed that you are predominantly in a normal but slow rhythm, which we already knew. You did have some extra heart beats that you probably did feel, but there were not worrisome from a cardiac perspective. Typically, if they are bothersome we would try giving you a medication that slows your heart rate down so they are less bothersome, but since your heart rate is already slow that would not be an option for you. They are not worrisome, but may be notable to you. Best, [name]    Reviewed results with patient and husband

## 2022-11-30 NOTE — Telephone Encounter (Signed)
-----   Message from Trudi Ida, NP sent at 11/30/2022 11:29 AM EDT ----- Good morning, Ms. Wurth called and wanted someone to review the results with her..She called main triage and asked and they messaged me. Can someone please reach out to her? Thank you :) [name]

## 2022-12-09 DIAGNOSIS — M25551 Pain in right hip: Secondary | ICD-10-CM | POA: Diagnosis not present

## 2022-12-12 NOTE — Progress Notes (Unsigned)
Cardiology Office Note:    Date:  12/13/2022   ID:  Bonnie [name], DOB 05-25-1941, MRN [id number]  PCP:  Tammi Sou, MD   [place] [facility] Cardiologist:  Minus Breeding, MD     Referring MD: Tammi Sou, MD   CC: follow up monitor results.    History of Present Illness:    Bonnie [name] is a 82 y.o. female with a hx of coronary artery disease (NSTEMI 2019, small vessel occlusion not amenable to intervention), diastolic dysfunction, GERD, hyperlipidemia, hypothyroidism, memory difficulties, MGUS, palpitations, rheumatoid arthritis.  She initially presented to our office in 2019 for evaluations of chest pain, she was in the hospital and had a heavy midsternal pressure associated with nausea.  Her EKG in the ED was abnormal with inverted T waves in leads III.  She had a cardiac cath on 08/08/2018 revealing two-vessel disease with 60 to 65% in the M LAD and mRCA.  No intervention was required but she was treated with DAPT (aspirin and Brilinta).   She wore an event monitor in 2019, that showed no significant arrhythmias, her symptoms of rapid heartbeat, lightheadedness, shortness of breath occurred during normal sinus rhythm.  She wore a monitor for palpitations in August 2021, this revealed no sustained arrhythmias, brief runs of SVT, no changes were made to her plan of care.  She was last evaluated in our office with Dr. Percival Spanish on 01/13/2022, at that time she had complaints of dizziness, she described it as feeling as if she was on a boat, dizziness did not occur at the same time that she has palpitations.  Blood pressure was controlled at this visit 132/64, heart rate 49 bpm.  The dizziness was not related to orthostasis, advise she should follow-up with her PCP as it could be somehow related to inner ear.  Repeat echo was obtained which showed EF of 60 to 65%, mild LVH, mild MR, mild AI.  Most recently she was evaluated after a recent visit to her PCP where  her heart rate was noted to be 48, she had also had some associated dizziness as well as a recent fall.  She reports that her fall was related to tripping over her husband's foot and not associated with dizziness.  She did endorse that she had episodes of palpitations at night, and occasional dizziness.  She wore a monitor which revealed predominantly sinus bradycardia and occasional runs of SVT.  She presents today accompanied by her husband for follow-up after recent monitor results.  She has been feeling well overall, working with PT for injury to her leg after recent fall as outlined above.  Her palpitations are not as bothersome, she most notices them at night when she is lying in bed and reportedly has a lot on her mind.  She continues to have mild pedal edema that worsens throughout the day and resolves overnight, she does try to prop her feet up when she can, she is not interested in wearing support stockings. She denies chest pain, palpitations, dyspnea, pnd, orthopnea, n, v, dizziness, syncope, edema, weight gain, or early satiety.   Past Medical History:  Diagnosis Date   Beta thalassemia, heterozygous    ?alpha? pt does not recall.   CAD (coronary artery disease) 07/2018   NSTEMI (small vessel occlusion, not amenable to intervention)-->2V CAD on cath, preserved LV systolic fxn.   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  Surgical path NEG for atypica or malignancy.   Memory difficulties 12/25/2020   MGUS (monoclonal gammopathy of unknown significance) 2022   Dr. Marin Olp eval 02/2021-->he feels it is  likely reactive d/t her RA.  Low suspicion of plasma cell dyscrasia-->f/u 7mo.   NSTEMI (non-ST elevated myocardial infarction) 07/2018   Dr. Percival Spanish   Osteoporosis 01/2021   01/2021 T scoree -2.7   Palpitations    08/2018 Event monitor normal. Long term event monitor 05/2020 w/out signif arrhythmia--some brief SVT.   Rheumatoid arthritis 2007   muscle weaknes at times (Dr. Amil Amen)   Tinnitus, bilateral 2021   with high frequ SSHL->reassured by Ent    Past Surgical History:  Procedure Laterality Date   ABDOMINAL HYSTERECTOMY  1987   DUB, no malignancy.  Ovaries still in.   BLADDER REPAIR     Cardiac Event Monitor  08/22/2018   NO arrhythmias.  Her symptomatic periods coresponded to NSR.   CHOLECYSTECTOMY  1969   COLONOSCOPY  11/07/2017   2014 adenoma.  Rpt 10/2017 adenoma x 1.  No further colonoscopies needed per GI.    DEXA  01/2021   T score -2.7   LEFT HEART CATH AND CORONARY ANGIOGRAPHY N/A 08/08/2018   Procedure: LEFT HEART CATH AND CORONARY ANGIOGRAPHY;  Surgeon: Troy Sine, MD;  Location: [facility];  Service: Cardiovascular;  Laterality: N/A;   THYROIDECTOMY  2012   cytologic atypia on nodule bx; surgical path NEG for atypica or malignancy.   TRANSTHORACIC ECHOCARDIOGRAM  08/07/2018   2019 EF 60-65%, no wall motion abnl, grd II DD, mod MR.  01/2022 mild AI and MR   UPPER GASTROINTESTINAL ENDOSCOPY     VAGINAL PROLAPSE REPAIR  2012    Current Medications: Current Meds  Medication Sig   acetaminophen (TYLENOL) 500 MG tablet Take 500 mg by mouth every 6 (six) hours as needed for headache (pain).   alendronate (FOSAMAX) 70 MG tablet TAKE 1 TABLET BY MOUTH ONCE A WEEK. TAKE WITH A FULL GLASS OF WATER ON AN EMPTY STOMACH.   aspirin EC 81 MG tablet Take 81 mg by mouth at bedtime.   Calcium Citrate-Vitamin D (CALCIUM CITRATE + D PO) Take 1 tablet by mouth 2 (two) times daily.    celecoxib (CELEBREX) 200 MG capsule Take by mouth daily as needed.   Coenzyme Q10 (CO  Q 10) 60 MG CAPS 1 capsule with a meal   diphenhydrAMINE (BENADRYL) 25 mg capsule 1 capsule as needed   escitalopram (LEXAPRO) 5 MG tablet Take 1 tablet by mouth daily.   fexofenadine (ALLEGRA) 99991111 MG tablet    folic acid (FOLVITE) 1 MG tablet Take 1 mg by mouth 2 (two) times daily.   isosorbide mononitrate (IMDUR) 60 MG 24 hr tablet TAKE 1 TABLET BY MOUTH EVERY DAY   LORazepam (ATIVAN) 0.5 MG tablet 1-2 tabs po bid prn anxiety   LORazepam (ATIVAN) 0.5 MG tablet Take by mouth.   methotrexate 2.5 MG tablet Take 12.5 mg by mouth every Sunday.    Multiple Vitamin (MULTIVITAMIN WITH MINERALS) TABS tablet Take 1 tablet by mouth daily.   nitroGLYCERIN (NITROSTAT) 0.4 MG SL tablet Place 1  tablet (0.4 mg total) under the tongue every 5 (five) minutes x 3 doses as needed for chest pain.   Omega-3 Fatty Acids (FISH OIL PO) Take by mouth daily.   potassium chloride (KLOR-CON) 8 MEQ tablet Take 2 tablets (16 mEq total) by mouth daily.   rosuvastatin (CRESTOR) 40 MG tablet TAKE 1 TABLET BY MOUTH DAILY AT 6 PM.   SYNTHROID 88 MCG tablet TAKE 1 TABLET BY MOUTH DAILY BEFORE BREAKFAST.     Allergies:   Bacitracin-polymyxin b, Donepezil, Neosporin [neomycin-bacitracin zn-polymyx], Niacin, Other, Seasonal ic [cholestatin], Triamcinolone, Triamcinolone acetonide, Atorvastatin, Monascus purpureus went yeast, Pitavastatin, and Pravastatin   Social History   Socioeconomic History   Marital status: Married    Spouse name: Evette Doffing   Number of children: 5   Years of education: Not on file   Highest education level: Not on file  Occupational History   Not on file  Tobacco Use   Smoking status: Former    Packs/day: 0.50    Years: 10.00    Additional pack years: 0.00    Total pack years: 5.00    Types: Cigarettes    Quit date: 11/19/1973    Years since quitting: 49.0   Smokeless tobacco: Never  Vaping Use   Vaping Use: Never used  Substance and Sexual Activity   Alcohol use: Yes    Comment: very rare  glass of wine ETOH use - wine with dinner- ,   Drug use: No   Sexual activity: Yes  Other Topics Concern   Not on file  Social History Narrative   Patient is an Optometrist , retired from  [employer], school aid,Lives with husband in [place].   Right Handed   Drinks 4-5 cups caffeine daily   Tobacco use former.  quit 50 years ago ,   ETOH use - wine with dinner- ,    Caffeine intake in form of Coffee( 2 cups in AM  ) Soda( when eating out) Tea ( decaff) or energy drinks.   Regular exercise in form of walking .     Hobbies :reading, volunteering.    Social Determinants of Health   Financial Resource Strain: Low Risk  (09/29/2022)   Overall Financial Resource Strain (CARDIA)    Difficulty of Paying Living Expenses: Not very hard  Food Insecurity: No Food Insecurity (09/29/2022)   Hunger Vital Sign    Worried About Running Out of Food in the Last Year: Never true    Ran Out of Food in the Last Year: Never true  Transportation Needs: No Transportation Needs (09/29/2022)   PRAPARE - Hydrologist (Medical): No    Lack of Transportation (Non-Medical): No  Physical Activity: Insufficiently Active (09/29/2022)   Exercise Vital Sign    Days of Exercise per Week: 4 days    Minutes of Exercise per Session: 10 min  Stress: No Stress Concern Present (09/29/2022)   [place]    Feeling of Stress : Not at all  Social Connections: [place] (09/29/2022)   Social Connection and Isolation Panel [NHANES]    Frequency of Communication with Friends and Family: More than three times a week    Frequency of Social Gatherings with Friends and Family: More than three times a week    Attends Religious Services: 1 to 4 times per year    Active Member of [employer] or Organizations: Yes    Attends Archivist Meetings: 1 to  4 times per year    Marital Status: Married     Family History: The patient's  family history includes CVA in her mother; Hypertension in her mother. There is no history of Colon cancer, Esophageal cancer, Stomach cancer, or Rectal cancer.  ROS:   Please see the history of present illness.    All other systems reviewed and are negative.  EKGs/Labs/Other Studies Reviewed:    The following studies were reviewed today:  11/18/2022 long-term monitor -predominant sinus bradycardia, occasional SVT  01/20/2022 echo complete -EF 60 to 65%, mild LVH, mild MR, mild AR.  08/08/2018 left heart cath-  Post Atrio lesion is 100% stenosed. Mid RCA lesion is 20% stenosed. Prox LAD to Mid LAD lesion is 60% stenosed. LV end diastolic pressure is normal.   Preserved global LV function with EF 55% and a focal area of mild mid posterior hypokinesis. LVEDP 14 mm Hg.   Two-vessel coronary artery disease with smooth 60 to 65% mid LAD stenosis which did not significantly improve following IC nitroglycerin administration, a normal large left circumflex coronary artery; and a dominant RCA with smooth 20% mid stenosis and a suggestion of abrupt cut off the in the mid posterior lateral vessel which was small caliber.   RECOMMENDATION: The patient's wall motion abnormality and inferior T wave inversion is consistent with the mid posterior lateral abrupt cut off occlusion.  Although the LAD lesion is 60 to 65%, it is not felt to be the "culprit " vessel as suggested by the mid posterior wall motion abnormality. In this patient with ACS and positive troponin to 15.61, recommend dual antiplatelet therapy for minimum of 1 year.  Medical therapy for concomitant CAD.  Aggressive lipid-lowering therapy with target LDL less than 70.  EKG:  EKG is not ordered today.    Recent Labs: 02/10/2022: ALT 11 11/01/2022: BUN 10; Creatinine, Ser 0.87; Hemoglobin 12.2; Magnesium 2.5; Platelets 268; Potassium 4.4; Sodium 144; TSH 1.810  Recent Lipid Panel    Component Value Date/Time   CHOL 143 02/10/2022 1006    TRIG 57.0 02/10/2022 1006   HDL 71.90 02/10/2022 1006   CHOLHDL 2 02/10/2022 1006   VLDL 11.4 02/10/2022 1006   LDLCALC 59 02/10/2022 1006   LDLCALC 52 10/12/2019 1357   LDLDIRECT 155.5 12/04/2012 1123     Risk Assessment/Calculations:                Physical Exam:    VS:  BP 120/62   Pulse 60   Ht 5\' 7"  (1.702 m)   Wt 132 lb (59.9 kg)   BMI 20.67 kg/m     Wt Readings from Last 3 Encounters:  12/13/22 132 lb (59.9 kg)  11/22/22 127 lb (57.6 kg)  11/04/22 128 lb (58.1 kg)     GEN:  Well nourished, well developed in no acute distress HEENT: Normal NECK: No JVD; No carotid bruits LYMPHATICS: No lymphadenopathy CARDIAC: RRR, no murmurs, rubs, gallops RESPIRATORY:  Clear to auscultation without rales, wheezing or rhonchi  ABDOMEN: Soft, non-tender, non-distended MUSCULOSKELETAL: +1 pitting edema noted at sock line,  no deformity  SKIN: Warm and dry NEUROLOGIC:  Alert and oriented x 3 PSYCHIATRIC:  Normal affect   ASSESSMENT:    1. Palpitations   2. Dizziness   3. Coronary artery disease involving native coronary artery of native heart without angina pectoris   4. Mixed hyperlipidemia   5. Nonrheumatic mitral valve regurgitation     PLAN:    In order of problems listed  above:  Coronary artery disease - Stable with no anginal symptoms. No indication for ischemic evaluation. No beta blocker d/t bradycardia. Continue aspirin, isosorbide, Crestor.  Dizziness -this has not been bothersome to her since her last visit, she does state that with positional changes she can feel dizzy at times. Bradycardia/palpitations -recent monitor showed sinus bradycardia with occasional runs of SVT.  Not a candidate for nodal blocking agents secondary to bradycardia.  Her palpitations are not bothersome to her. Dyslipidemia - LDL 59 on 02/10/2022, currently well-controlled, continue Crestor. Mild mitral valve regurgitation/mild aortic valve insufficiency- mild on recent echo in May  2023, no indication to repeat echo today.     Disposition-follow-up with Dr. Percival Spanish in 6 months.       Medication Adjustments/Labs and Tests Ordered: Current medicines are reviewed at length with the patient today.  Concerns regarding medicines are outlined above.  No orders of the defined types were placed in this encounter.  No orders of the defined types were placed in this encounter.   Patient Instructions  Medication Instructions:  Your physician recommends that you continue on your current medications as directed. Please refer to the Current Medication list given to you today.  *If you need a refill on your cardiac medications before your next appointment, please call your pharmacy*  Follow-Up: At [facility], you and your health needs are our priority.  As part of our continuing mission to provide you with exceptional heart care, we have created designated Provider Care Teams.  These Care Teams include your primary Cardiologist (physician) and Advanced Practice Providers (APPs -  Physician Assistants and Nurse Practitioners) who all work together to provide you with the care you need, when you need it.  We recommend signing up for the patient portal called "MyChart".  Sign up information is provided on this After Visit Summary.  MyChart is used to connect with patients for Virtual Visits (Telemedicine).  Patients are able to view lab/test results, encounter notes, upcoming appointments, etc.  Non-urgent messages can be sent to your provider as well.   To learn more about what you can do with MyChart, go to [email or web address].    Your next appointment:   6 month(s)  Provider:   Dr. Percival Spanish     Signed, Trudi Ida, NP  12/13/2022 10:50 AM    [place]

## 2022-12-13 ENCOUNTER — Encounter (HOSPITAL_BASED_OUTPATIENT_CLINIC_OR_DEPARTMENT_OTHER): Payer: Self-pay | Admitting: Cardiology

## 2022-12-13 ENCOUNTER — Ambulatory Visit (INDEPENDENT_AMBULATORY_CARE_PROVIDER_SITE_OTHER): Payer: Medicare Other | Admitting: Cardiology

## 2022-12-13 VITALS — BP 120/62 | HR 60 | Ht 67.0 in | Wt 132.0 lb

## 2022-12-13 DIAGNOSIS — R002 Palpitations: Secondary | ICD-10-CM | POA: Diagnosis not present

## 2022-12-13 DIAGNOSIS — R42 Dizziness and giddiness: Secondary | ICD-10-CM

## 2022-12-13 DIAGNOSIS — I2510 Atherosclerotic heart disease of native coronary artery without angina pectoris: Secondary | ICD-10-CM | POA: Diagnosis not present

## 2022-12-13 DIAGNOSIS — E782 Mixed hyperlipidemia: Secondary | ICD-10-CM | POA: Diagnosis not present

## 2022-12-13 DIAGNOSIS — I340 Nonrheumatic mitral (valve) insufficiency: Secondary | ICD-10-CM | POA: Diagnosis not present

## 2022-12-13 NOTE — Patient Instructions (Signed)
Medication Instructions:  Your physician recommends that you continue on your current medications as directed. Please refer to the Current Medication list given to you today.  *If you need a refill on your cardiac medications before your next appointment, please call your pharmacy*  Follow-Up: At [facility], you and your health needs are our priority.  As part of our continuing mission to provide you with exceptional heart care, we have created designated Provider Care Teams.  These Care Teams include your primary Cardiologist (physician) and Advanced Practice Providers (APPs -  Physician Assistants and Nurse Practitioners) who all work together to provide you with the care you need, when you need it.  We recommend signing up for the patient portal called "MyChart".  Sign up information is provided on this After Visit Summary.  MyChart is used to connect with patients for Virtual Visits (Telemedicine).  Patients are able to view lab/test results, encounter notes, upcoming appointments, etc.  Non-urgent messages can be sent to your provider as well.   To learn more about what you can do with MyChart, go to [email or web address].    Your next appointment:   6 month(s)  Provider:   Dr. Percival Spanish

## 2022-12-21 ENCOUNTER — Other Ambulatory Visit: Payer: Self-pay | Admitting: Family Medicine

## 2022-12-21 ENCOUNTER — Other Ambulatory Visit: Payer: Self-pay | Admitting: Cardiology

## 2022-12-27 ENCOUNTER — Encounter: Payer: Self-pay | Admitting: *Deleted

## 2023-01-05 NOTE — Progress Notes (Unsigned)
Cardiology Office Note:   Date:  01/06/2023  ID:  Bonnie [name], DOB 11/21/1940, MRN [id number]  History of Present Illness:   Bonnie [name] is a 82 y.o. female who presents for evaluation of chest pain.  She was in the hospital in 08/06/2018 describing heavy pressure midsternal with associated nausea. EKG in ED was abnormal with inverted T-wave in Lead III.    She had cardiac cath on 08/08/2018 revealing 2 vessel disease with 60-65 % in the mLAD, and mRCA with an abrupt cut off in the mid posterior lateral vessel which was a small caliber vessel and suspected to be the culprit vessel with normal L Cx. She did not require intervention but was treated with DAPT with ASA and Brilinta, with aggressive lipid lowering recommended with increased dose of Crestor.  She was noted to have moderate MR and mild AI on echo with normal LV function .  She wore a monitor for palpitations in August 2021, this revealed no sustained arrhythmias, brief runs of SVT, no changes were made to her plan of care.   She had bradycardia and dizziness.  She does get some mild orthostatic symptoms occasionally.  She has some balance issues.  The patient denies any new symptoms such as chest discomfort, neck or arm discomfort. There has been no new shortness of breath, PND or orthopnea. There have been no reported palpitations, presyncope or syncope. She was last evaluated in our office with Dr. Antoine Poche on 01/13/2022, at that time she had complaints of dizziness, she described it as feeling as if she was on a boat, dizziness did not occur at the same time that she has palpitations.  Blood pressure was controlled at this visit 132/64, heart rate 49 bpm.  The dizziness was not related to orthostasis, advise she should follow-up with her PCP as it could be somehow related to inner ear.  Repeat echo was obtained which showed EF of 60 to 65%, mild LVH, mild MR, mild AI.   Most recently she was evaluated after a recent visit to her PCP  where her heart rate was noted to be 48, she had also had some associated dizziness as well as a recent fall.  She reports that her fall was related to tripping over her husband's foot and not associated with dizziness.  She did endorse that she had episodes of palpitations at night, and occasional dizziness.  She wore a monitor which revealed predominantly sinus bradycardia and occasional runs of SVT.   She presents today accompanied by her husband for follow-up after recent monitor results.  She has been feeling well overall, working with PT for injury to her leg after recent fall as outlined above.  Her palpitations are not as bothersome, she most notices them at night when she is lying in bed and reportedly has a lot on her mind.  She continues to have mild pedal edema that worsens throughout the day and resolves overnight, she does try to prop her feet up when she can, she is not interested in wearing support stockings. She denies chest pain, palpitations, dyspnea, pnd, orthopnea, n, v, dizziness, syncope, edema, weight gain, or early satiety.   ROS: As stated in the HPI and negative for all other systems.  Studies Reviewed:    EKG: Sinus rhythm, rate 62, axis within normal limits, intervals within normal limits, nonspecific ST-T wave flattening.  Risk Assessment/Calculations:              Physical Exam:  VS:  BP 122/60 (BP Location: Left Arm, Patient Position: Sitting, Cuff Size: Normal)   Ht  (1.702 m)   Wt 134 lb 3.2 oz (60.9 kg)   SpO2 99%   BMI 21.02 kg/m    Wt Readings from Last 3 Encounters:  01/06/23 134 lb 3.2 oz (60.9 kg)  12/13/22 132 lb (59.9 kg)  11/22/22 127 lb (57.6 kg)     GEN: Well nourished, well developed in no acute distress NECK: No JVD; No carotid bruits CARDIAC: RRR, very soft brief apical nonradiating systolic murmur, no diastolic murmurs, rubs, gallops RESPIRATORY:  Clear to auscultation without rales, wheezing or rhonchi  ABDOMEN: Soft, non-tender,  non-distended EXTREMITIES:  No edema; No deformity   ASSESSMENT AND PLAN:   CAD:    The patient has no new sypmtoms.  No further cardiovascular testing is indicated.  We will continue with aggressive risk reduction and meds as listed.  MR:  This was mild in 2023.  No further imaging   AI:   This was mild in 2023.  No further imaging at this point   PALPITATIONS:   She had some of this at night may be waking her self from dreams or startling but she has not had any significant symptomatic arrhythmias and given her bradycardia would not treat her occasional SVT.       DYSLIPDEMIA: Her LDL was 57 previously.  I have suggested and put in the computer orders for lipids and LP(a) when she sees her primary on May 7.    DIZZINESS: We talked about precautions from falling.  She may have a little bit of this related to orthostasis but I do not think there is probably a cardiac etiology necessarily to this.  She has worked with physical therapy.       Signed, Rollene Rotunda, MD

## 2023-01-06 ENCOUNTER — Encounter: Payer: Self-pay | Admitting: Cardiology

## 2023-01-06 ENCOUNTER — Ambulatory Visit: Payer: Medicare Other | Attending: Cardiology | Admitting: Cardiology

## 2023-01-06 VITALS — BP 122/60 | Ht 67.0 in | Wt 134.2 lb

## 2023-01-06 DIAGNOSIS — I2510 Atherosclerotic heart disease of native coronary artery without angina pectoris: Secondary | ICD-10-CM | POA: Diagnosis not present

## 2023-01-06 DIAGNOSIS — E785 Hyperlipidemia, unspecified: Secondary | ICD-10-CM | POA: Diagnosis not present

## 2023-01-06 DIAGNOSIS — I351 Nonrheumatic aortic (valve) insufficiency: Secondary | ICD-10-CM | POA: Diagnosis not present

## 2023-01-06 DIAGNOSIS — R002 Palpitations: Secondary | ICD-10-CM | POA: Insufficient documentation

## 2023-01-06 DIAGNOSIS — I340 Nonrheumatic mitral (valve) insufficiency: Secondary | ICD-10-CM | POA: Diagnosis not present

## 2023-01-06 DIAGNOSIS — R42 Dizziness and giddiness: Secondary | ICD-10-CM | POA: Diagnosis not present

## 2023-01-06 NOTE — Patient Instructions (Signed)
Medication Instructions:  Your physician recommends that you continue on your current medications as directed. Please refer to the Current Medication list given to you today.  *If you need a refill on your cardiac medications before your next appointment, please call your pharmacy*   Lab Work: Your physician recommends that you have these labs added to your upcoming appointment with your primary care provider: Lipids, Lp(a) If you have labs (blood work) drawn today and your tests are completely normal, you will receive your results only by: MyChart Message (if you have MyChart) OR A paper copy in the mail If you have any lab test that is abnormal or we need to change your treatment, we will call you to review the results.   Testing/Procedures: None   Follow-Up: At [facility], you and your health needs are our priority.  As part of our continuing mission to provide you with exceptional heart care, we have created designated Provider Care Teams.  These Care Teams include your primary Cardiologist (physician) and Advanced Practice Providers (APPs -  Physician Assistants and Nurse Practitioners) who all work together to provide you with the care you need, when you need it.   Your next appointment:   1 year(s)  Provider:   Rollene Rotunda, MD

## 2023-01-07 NOTE — Addendum Note (Signed)
Addended by: Alondra Vandeven, [place] M on: 01/07/2023 10:08 AM   Modules accepted: Orders

## 2023-01-18 ENCOUNTER — Ambulatory Visit (INDEPENDENT_AMBULATORY_CARE_PROVIDER_SITE_OTHER): Payer: Medicare Other | Admitting: Family Medicine

## 2023-01-18 ENCOUNTER — Encounter: Payer: Self-pay | Admitting: Family Medicine

## 2023-01-18 VITALS — BP 127/79 | HR 50 | Wt 131.8 lb

## 2023-01-18 DIAGNOSIS — H9319 Tinnitus, unspecified ear: Secondary | ICD-10-CM | POA: Diagnosis not present

## 2023-01-18 DIAGNOSIS — M810 Age-related osteoporosis without current pathological fracture: Secondary | ICD-10-CM

## 2023-01-18 DIAGNOSIS — E7800 Pure hypercholesterolemia, unspecified: Secondary | ICD-10-CM | POA: Diagnosis not present

## 2023-01-18 DIAGNOSIS — H9190 Unspecified hearing loss, unspecified ear: Secondary | ICD-10-CM | POA: Diagnosis not present

## 2023-01-18 LAB — LIPID PANEL
Cholesterol: 144 mg/dL (ref 0–200)
HDL: 69.5 mg/dL (ref >39.00)
LDL Cholesterol: 63 mg/dL (ref 0–99)
NonHDL: 74.03
Total CHOL/HDL Ratio: 2
Triglycerides: 54 mg/dL (ref 0.0–149.0)
VLDL: 10.8 mg/dL (ref 0.0–40.0)

## 2023-01-18 LAB — VITAMIN D 25 HYDROXY (VIT D DEFICIENCY, FRACTURES): VITD: 62.8 ng/mL (ref 30.00–100.00)

## 2023-01-18 MED ORDER — ALENDRONATE SODIUM 70 MG PO TABS: ORAL_TABLET | ORAL | 3 refills | Status: DC

## 2023-01-18 NOTE — Progress Notes (Signed)
OFFICE VISIT  01/18/2023  CC:  Chief Complaint  Patient presents with   Follow-up    6 month follow up. Pt is fasting. She states that her tinnitus is still bothersome.    Patient is a 82 y.o. female who presents accompanied by her husband Vinnie for 49-month follow-up chronic anxiety, hypothyroidism, osteoporosis, and hyperlipidemia. A/P as of last visit: "#1 chronic tinnitus, right greater than left. Denies hearing impairment. She has a vague disequilibrium syndrome that seemed to coincide with onset of her ringing, but admittedly patient and husband cannot be clear on this. We will ask ENT to see her.   #2 memory impairment. Mild cognitive impairment, normal slow progression.  She remains functional. I reassured her.  Donepezil in the past made her dizzy.  She has declined a trial of Namenda in the past. Encouraged her to arrange a routine follow-up with her neurologist."  INTERIM HX: Bonnie [name] feels well. She has chronic ringing in her years, cannot localize it to 1 year or the other.  She is not sure if she has any hearing impairment or not.  I referred her to ENT last time I saw her 6 months ago and it is not clear whether she went or not.  She has chronic low level of cognitive impairment. Waxing and waning feeling of lightheadedness--- chronic and unchanged.  Worse upon standing.  No falls.  No vertigo.  No headaches.  She has been taking her alendronate.  She is not sure when she stopped it but estimates about a year ago.  She felt like maybe it was contributing to her chronic level of lightheadedness.  Getting off medication did not change the symptoms, though.  Anxiety level is stable.  She does not take lorazepam anymore at all.  She takes Lexapro 5 mg daily. PMP AWARE reviewed today: most recent rx for lorazepam 0.5 mg was filled 02/03/2021, # 60, rx by me. No red flags.  ROS as above, plus--> no fevers, no CP, no SOB, no wheezing, no cough, no rashes, no  melena/hematochezia.  No polyuria or polydipsia.  No myalgias or arthralgias.  No focal weakness, paresthesias, or tremors.  No acute vision or hearing abnormalities.  No dysuria or unusual/new urinary urgency or frequency.  No recent changes in lower legs. No n/v/d or abd pain.  No palpitations.    Past Medical History:  Diagnosis Date   Beta thalassemia, heterozygous    ?alpha? pt does not recall.   Bradycardia    mild sinus   CAD (coronary artery disease) 07/2018   NSTEMI (small vessel occlusion, not amenable to intervention)-->2V CAD on cath, preserved LV systolic fxn.   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  Surgical path NEG for atypica or malignancy.   Memory difficulties 12/25/2020   MGUS (monoclonal gammopathy of unknown significance) 2022   Dr. Myna Hidalgo eval 02/2021-->he feels it is likely reactive d/t her RA.  Low suspicion of plasma cell dyscrasia-->f/u 15mo.   NSTEMI (non-ST elevated myocardial infarction) (HCC) 07/2018   Dr. Antoine Poche   Osteoporosis 01/2021   01/2021 T scoree -2.7  Palpitations    08/2018 Event monitor normal. Long term event monitor 05/2020 w/out signif arrhythmia--some brief SVT.   Rheumatoid arthritis (HCC) 2007   muscle weaknes at times (Dr. Dierdre Forth)   Tinnitus, bilateral 2021   with high frequ SSHL->reassured by Ent    Past Surgical History:  Procedure Laterality Date   ABDOMINAL HYSTERECTOMY  1987   DUB, no malignancy.  Ovaries still in.   BLADDER REPAIR     Cardiac Event Monitor  08/22/2018   NO arrhythmias 2019 or 2024 (sinus brady)   CHOLECYSTECTOMY  1969    COLONOSCOPY  11/07/2017   2014 adenoma.  Rpt 10/2017 adenoma x 1.  No further colonoscopies needed per GI.    DEXA  01/2021   T score -2.7   LEFT HEART CATH AND CORONARY ANGIOGRAPHY N/A 08/08/2018   Procedure: LEFT HEART CATH AND CORONARY ANGIOGRAPHY;  Surgeon: Lennette Bihari, MD;  Location: [facility];  Service: Cardiovascular;  Laterality: N/A;   THYROIDECTOMY  2012   cytologic atypia on nodule bx; surgical path NEG for atypica or malignancy.   TRANSTHORACIC ECHOCARDIOGRAM  08/07/2018   2019 EF 60-65%, no wall motion abnl, grd II DD, mod MR.  01/2022 mild AI and MR   UPPER GASTROINTESTINAL ENDOSCOPY     VAGINAL PROLAPSE REPAIR  2012    Outpatient Medications Prior to Visit  Medication Sig Dispense Refill   acetaminophen (TYLENOL) 500 MG tablet Take 500 mg by mouth every 6 (six) hours as needed for headache (pain).     aspirin EC 81 MG tablet Take 81 mg by mouth at bedtime.     Calcium Citrate-Vitamin D (CALCIUM CITRATE + D PO) Take 1 tablet by mouth 2 (two) times daily.      Coenzyme Q10 (CO Q 10) 60 MG CAPS 1 capsule with a meal     diphenhydrAMINE (BENADRYL) 25 mg capsule 1 capsule as needed     escitalopram (LEXAPRO) 5 MG tablet Take 1 tablet by mouth daily.     fexofenadine (ALLEGRA) 180 MG tablet      folic acid (FOLVITE) 1 MG tablet Take 1 mg by mouth 2 (two) times daily.     isosorbide mononitrate (IMDUR) 60 MG 24 hr tablet TAKE 1 TABLET BY MOUTH EVERY DAY 90 tablet 3   methotrexate 2.5 MG tablet Take 12.5 mg by mouth every Sunday.      Multiple Vitamin (MULTIVITAMIN WITH MINERALS) TABS tablet Take 1 tablet by mouth daily.     nitroGLYCERIN (NITROSTAT) 0.4 MG SL tablet Place 1 tablet (0.4 mg total) under the tongue every 5 (five) minutes x 3 doses as needed for chest pain. 25 tablet 3   potassium chloride (KLOR-CON) 8 MEQ tablet Take 2 tablets (16 mEq total) by mouth daily. 180 tablet 3   rosuvastatin (CRESTOR) 40 MG tablet TAKE 1 TABLET BY MOUTH DAILY AT 6 PM. 90  tablet 3   SYNTHROID 88 MCG tablet TAKE 1 TABLET BY MOUTH EVERY DAY BEFORE BREAKFAST 90 tablet 1   celecoxib (CELEBREX) 200 MG capsule Take by mouth daily as needed.     LORazepam (ATIVAN) 0.5 MG tablet 1-2 tabs po bid prn anxiety 60 tablet 1   alendronate (FOSAMAX) 70 MG tablet TAKE 1 TABLET BY MOUTH ONCE A WEEK. TAKE WITH A FULL GLASS OF WATER ON AN EMPTY STOMACH. 12 tablet 3   LORazepam (ATIVAN) 0.5 MG tablet Take by mouth. (Patient not taking: Reported on 01/06/2023)     Omega-3 Fatty  Acids (FISH OIL PO) Take by mouth daily. (Patient not taking: Reported on 01/06/2023)     No facility-administered medications prior to visit.    Allergies  Allergen Reactions   Bacitracin-Polymyxin B     Other reaction(s): Unknown Other reaction(s): Other (See Comments) unknown   Donepezil     Vivid dreams Other reaction(s): Other (See Comments) Vivid Dreams   Neosporin [Neomycin-Bacitracin Zn-Polymyx] Other (See Comments)    Patient states that wounds or scratches never heal. (pt uses bacitracin)    Niacin     Other reaction(s): Myal;gia Other reaction(s): Myalgias (intolerance)   Other     Other reaction(s): Unknown   Seasonal Ic [Cholestatin] Other (See Comments)    Leg cramps   Triamcinolone Other (See Comments)    Burning sensation   Triamcinolone Acetonide     Other reaction(s): Unknown   Atorvastatin Other (See Comments)    Leg cramps Other reaction(s): Myalgia   Monascus Purpureus Went Yeast Other (See Comments)    "Red Yeast Rice" causes leg cramps Other reaction(s): Myalgia   Pitavastatin     Other reaction(s): Myalgia Other reaction(s): Cramps (ALLERGY/intolerance), Myalgias (intolerance)   Pravastatin Other (See Comments)    Leg cramps    Review of Systems As per HPI  PE:    01/18/2023    9:03 AM 01/06/2023   10:09 AM 12/13/2022    9:53 AM  Vitals with BMI  Height  5\' 7"  5\' 7"   Weight 131 lbs 13 oz 134 lbs 3 oz 132 lbs  BMI 20.64 21.01 20.67  Systolic 127 122 952   Diastolic 79 60 62  Pulse 50  60     Physical Exam  Gen: Alert, well appearing.  Patient is oriented to person, place, time, and situation. ENT: Ears: EACs clear, normal epithelium.  TMs with good light reflex and landmarks bilaterally.  Eyes: no injection, icteris, swelling, or exudate.  EOMI, PERRLA. Nose: no drainage or turbinate edema/swelling.  No injection or focal lesion.  Mouth: lips without lesion/swelling.  Oral mucosa pink and moist.  Dentition intact and without obvious caries or gingival swelling.  Oropharynx without erythema, exudate, or swelling.  CV: RRR, no m/r/g.   LUNGS: CTA bilat, nonlabored resps, good aeration in all lung fields. EXT: 1+ bilat LL pitting edema  AFFECT: pleasant, lucid thought and speech. Neuro: CN 2-12 intact bilaterally, strength 5/5 in proximal and distal upper extremities and lower extremities bilaterally.  No tremor.  No disdiadochokinesis.  No ataxia.  Upper extremity and lower extremity DTRs symmetric.  No pronator drift.   LABS:  Last CBC Lab Results  Component Value Date   WBC 6.4 11/01/2022   HGB 12.2 11/01/2022   HCT 36.8 11/01/2022   MCV 96 11/01/2022   MCH 31.7 11/01/2022   RDW 15.2 11/01/2022   PLT 268 11/01/2022   Last metabolic panel Lab Results  Component Value Date   GLUCOSE 81 11/01/2022   NA 144 11/01/2022   K 4.4 11/01/2022   CL 106 11/01/2022   CO2 23 11/01/2022   BUN 10 11/01/2022   CREATININE 0.87 11/01/2022   EGFR 67 11/01/2022   CALCIUM 9.3 11/01/2022   PROT 6.4 02/10/2022   ALBUMIN 3.8 02/10/2022   LABGLOB 3.1 08/27/2021   AGRATIO 1.2 08/27/2021   BILITOT 0.5 02/10/2022   ALKPHOS 35 (L) 02/10/2022   AST 18 02/10/2022   ALT 11 02/10/2022   ANIONGAP 6 08/27/2021   Last lipids Lab Results  Component Value Date  CHOL 143 02/10/2022   HDL 71.90 02/10/2022   LDLCALC 59 02/10/2022   LDLDIRECT 155.5 12/04/2012   TRIG 57.0 02/10/2022   CHOLHDL 2 02/10/2022   Last hemoglobin A1c Lab Results   Component Value Date   HGBA1C 5.7 (H) 08/07/2018   Last thyroid functions Lab Results  Component Value Date   TSH 1.810 11/01/2022   Last vitamin D Lab Results  Component Value Date   VD25OH 56.84 02/10/2022   Last vitamin B12 and Folate Lab Results  Component Value Date   VITAMINB12 527 12/25/2020   FOLATE 12.7 06/28/2007   IMPRESSION AND PLAN:  #1 GAD. Lexapro 5 mg daily helping well. Will take lorazepam off of her med list--- she has not used this in a long time.  2.  Hypercholesterolemia, goal LDL less than 70. LDL level is 59 one year ago. Lipid panel and lipoprotein a levels drawn today--will forward results to her cardiologist, Dr. Antoine Poche.  #3 osteoporosis. She took alendronate approximately 1 year.  For the last year she has not taken it. Will get her back on this and plan repeat DEXA in 1 year. Continue calcium and vitamin D supplement. Check vitamin D level today.  4.  Hypothyroidism, TSH 1.8 about 3 months ago. Continue 88 mcg Synthroid daily.  #5 breast cancer screening: Next mammogram due 08/2023.  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 This seems to be gradually progressing.  She remains completely functional, though.  #7 chronic tinnitus.  Question of hearing loss. Will ask audiology to see her for further evaluation.  An After Visit Summary was printed and given to the patient.  FOLLOW UP: Return in about 6 months (around 07/21/2023) for routine chronic illness f/u.  Signed:  Santiago Bumpers, MD           01/18/2023

## 2023-01-24 ENCOUNTER — Telehealth: Payer: Self-pay | Admitting: Family Medicine

## 2023-01-24 NOTE — Telephone Encounter (Signed)
Patient request that she receive a hard copy of her last lab results via mail to her address.

## 2023-01-25 LAB — LIPOPROTEIN A (LPA): Lipoprotein (a): <10 nmol/L (ref <75)

## 2023-01-25 NOTE — Telephone Encounter (Signed)
Labs printed and mailed per pt's request. 

## 2023-01-27 DIAGNOSIS — H903 Sensorineural hearing loss, bilateral: Secondary | ICD-10-CM | POA: Diagnosis not present

## 2023-02-17 DIAGNOSIS — M0589 Other rheumatoid arthritis with rheumatoid factor of multiple sites: Secondary | ICD-10-CM | POA: Diagnosis not present

## 2023-02-17 DIAGNOSIS — Z6821 Body mass index (BMI) 21.0-21.9, adult: Secondary | ICD-10-CM | POA: Diagnosis not present

## 2023-02-17 DIAGNOSIS — M791 Myalgia, unspecified site: Secondary | ICD-10-CM | POA: Diagnosis not present

## 2023-02-17 DIAGNOSIS — Z79899 Other long term (current) drug therapy: Secondary | ICD-10-CM | POA: Diagnosis not present

## 2023-03-20 ENCOUNTER — Encounter: Payer: Self-pay | Admitting: Family Medicine

## 2023-04-06 ENCOUNTER — Encounter (HOSPITAL_BASED_OUTPATIENT_CLINIC_OR_DEPARTMENT_OTHER): Payer: Self-pay

## 2023-04-06 ENCOUNTER — Other Ambulatory Visit: Payer: Self-pay

## 2023-04-06 ENCOUNTER — Emergency Department (HOSPITAL_BASED_OUTPATIENT_CLINIC_OR_DEPARTMENT_OTHER): Payer: Medicare Other

## 2023-04-06 ENCOUNTER — Emergency Department (HOSPITAL_BASED_OUTPATIENT_CLINIC_OR_DEPARTMENT_OTHER)
Admission: EM | Admit: 2023-04-06 | Discharge: 2023-04-06 | Disposition: A | Discharge status: Home / Self Care | Payer: Medicare Other | Attending: Emergency Medicine | Admitting: Emergency Medicine

## 2023-04-06 DIAGNOSIS — Z7982 Long term (current) use of aspirin: Secondary | ICD-10-CM | POA: Diagnosis not present

## 2023-04-06 DIAGNOSIS — M546 Pain in thoracic spine: Secondary | ICD-10-CM | POA: Diagnosis not present

## 2023-04-06 DIAGNOSIS — I2510 Atherosclerotic heart disease of native coronary artery without angina pectoris: Secondary | ICD-10-CM | POA: Diagnosis not present

## 2023-04-06 DIAGNOSIS — N2889 Other specified disorders of kidney and ureter: Secondary | ICD-10-CM | POA: Diagnosis not present

## 2023-04-06 DIAGNOSIS — R110 Nausea: Secondary | ICD-10-CM | POA: Insufficient documentation

## 2023-04-06 DIAGNOSIS — R001 Bradycardia, unspecified: Secondary | ICD-10-CM | POA: Diagnosis not present

## 2023-04-06 DIAGNOSIS — R079 Chest pain, unspecified: Secondary | ICD-10-CM | POA: Diagnosis not present

## 2023-04-06 DIAGNOSIS — E039 Hypothyroidism, unspecified: Secondary | ICD-10-CM | POA: Diagnosis not present

## 2023-04-06 DIAGNOSIS — R1012 Left upper quadrant pain: Secondary | ICD-10-CM | POA: Diagnosis not present

## 2023-04-06 DIAGNOSIS — Z79899 Other long term (current) drug therapy: Secondary | ICD-10-CM | POA: Insufficient documentation

## 2023-04-06 DIAGNOSIS — D1771 Benign lipomatous neoplasm of kidney: Secondary | ICD-10-CM | POA: Diagnosis not present

## 2023-04-06 DIAGNOSIS — N289 Disorder of kidney and ureter, unspecified: Secondary | ICD-10-CM

## 2023-04-06 DIAGNOSIS — Z87891 Personal history of nicotine dependence: Secondary | ICD-10-CM | POA: Diagnosis not present

## 2023-04-06 DIAGNOSIS — K5730 Diverticulosis of large intestine without perforation or abscess without bleeding: Secondary | ICD-10-CM | POA: Diagnosis not present

## 2023-04-06 DIAGNOSIS — R1901 Right upper quadrant abdominal swelling, mass and lump: Secondary | ICD-10-CM | POA: Diagnosis not present

## 2023-04-06 LAB — LIPASE, BLOOD: Lipase: 33 U/L (ref 11–51)

## 2023-04-06 LAB — URINALYSIS, MICROSCOPIC (REFLEX)

## 2023-04-06 LAB — URINALYSIS, ROUTINE W REFLEX MICROSCOPIC
Bilirubin Urine: NEGATIVE
Glucose, UA: NEGATIVE mg/dL
Ketones, ur: NEGATIVE mg/dL
Nitrite: NEGATIVE
Protein, ur: NEGATIVE mg/dL
Specific Gravity, Urine: 1.015 (ref 1.005–1.030)
pH: 8.5 — ABNORMAL HIGH (ref 5.0–8.0)

## 2023-04-06 LAB — CBC
HCT: 33.8 % — ABNORMAL LOW (ref 36.0–46.0)
Hemoglobin: 11.2 g/dL — ABNORMAL LOW (ref 12.0–15.0)
MCH: 31.4 pg (ref 26.0–34.0)
MCHC: 33.1 g/dL (ref 30.0–36.0)
MCV: 94.7 fL (ref 80.0–100.0)
Platelets: 290 10*3/uL (ref 150–400)
RBC: 3.57 MIL/uL — ABNORMAL LOW (ref 3.87–5.11)
RDW: 14.3 % (ref 11.5–15.5)
WBC: 5.5 10*3/uL (ref 4.0–10.5)
nRBC: 0 % (ref 0.0–0.2)

## 2023-04-06 LAB — COMPREHENSIVE METABOLIC PANEL
ALT: 12 U/L (ref 0–44)
AST: 20 U/L (ref 15–41)
Albumin: 3.3 g/dL — ABNORMAL LOW (ref 3.5–5.0)
Alkaline Phosphatase: 37 U/L — ABNORMAL LOW (ref 38–126)
Anion gap: 8 (ref 5–15)
BUN: 11 mg/dL (ref 8–23)
CO2: 21 mmol/L — ABNORMAL LOW (ref 22–32)
Calcium: 8.5 mg/dL — ABNORMAL LOW (ref 8.9–10.3)
Chloride: 110 mmol/L (ref 98–111)
Creatinine, Ser: 0.69 mg/dL (ref 0.44–1.00)
GFR, Estimated: >60 mL/min (ref >60)
Glucose, Bld: 95 mg/dL (ref 70–99)
Potassium: 4 mmol/L (ref 3.5–5.1)
Sodium: 139 mmol/L (ref 135–145)
Total Bilirubin: 0.7 mg/dL (ref 0.3–1.2)
Total Protein: 6.4 g/dL — ABNORMAL LOW (ref 6.5–8.1)

## 2023-04-06 LAB — TROPONIN I (HIGH SENSITIVITY)
Troponin I (High Sensitivity): 3 ng/L (ref <18)
Troponin I (High Sensitivity): 3 ng/L (ref <18)

## 2023-04-06 MED ORDER — IOHEXOL 300 MG/ML  SOLN
100.0000 mL | Freq: Once | INTRAMUSCULAR | Status: AC | PRN
  Administered 2023-04-06: 100 mL via INTRAVENOUS

## 2023-04-06 NOTE — ED Provider Notes (Addendum)
[facility] Provider Note   CSN: [id number] Arrival date & time: 04/06/23  0609     History  Chief Complaint  Patient presents with   Abdominal Pain    Bonnie [name] is a 82 y.o. female.  Patient with a complaint of left upper quadrant abdominal pain last night.  Associated with nausea.  Took Tums but when she woke this morning had more sort of left upper flank left lateral to posterior thoracic pain.  That is now completely resolved and she is asymptomatic.  Past medical history of your hyperlipidemia rheumatoid arthritis hypothyroidism postsurgical 2012 non-STEMI in 2019 for coronary artery disease systolic dysfunction 2019 monoclonal gamma neuropathy of unknown significance in 2022 followed by Dr. Myna Hidalgo.  Past surgical history significant for abdominal hysterectomy cholecystectomy thyroidectomy.  Patient is a former smoker quit 1975.       Home Medications Prior to Admission medications   Medication Sig Start Date End Date Taking? Authorizing Provider  acetaminophen (TYLENOL) 500 MG tablet Take 500 mg by mouth every 6 (six) hours as needed for headache (pain).    [provider]  alendronate (FOSAMAX) 70 MG tablet TAKE 1 TABLET BY MOUTH ONCE A WEEK. TAKE WITH A FULL GLASS OF WATER ON AN EMPTY STOMACH. 01/18/23   McGowen, Maryjean Morn, MD  aspirin EC 81 MG tablet Take 81 mg by mouth at bedtime.    [provider]  Calcium Citrate-Vitamin D (CALCIUM CITRATE + D PO) Take 1 tablet by mouth 2 (two) times daily.     [provider]  Coenzyme Q10 (CO Q 10) 60 MG CAPS 1 capsule with a meal    [provider]  diphenhydrAMINE (BENADRYL) 25 mg capsule 1 capsule as needed    [provider]  escitalopram (LEXAPRO) 5 MG tablet Take 1 tablet by mouth daily. 02/11/22   [provider]  fexofenadine (ALLEGRA) 180 MG tablet     [provider]  folic acid (FOLVITE) 1 MG tablet Take 1 mg by  mouth 2 (two) times daily.    [provider]  isosorbide mononitrate (IMDUR) 60 MG 24 hr tablet TAKE 1 TABLET BY MOUTH EVERY DAY 05/03/22   Rollene Rotunda, MD  methotrexate 2.5 MG tablet Take 12.5 mg by mouth every Sunday.     [provider]  Multiple Vitamin (MULTIVITAMIN WITH MINERALS) TABS tablet Take 1 tablet by mouth daily.    [provider]  nitroGLYCERIN (NITROSTAT) 0.4 MG SL tablet Place 1 tablet (0.4 mg total) under the tongue every 5 (five) minutes x 3 doses as needed for chest pain. 07/07/21   Rollene Rotunda, MD  potassium chloride (KLOR-CON) 8 MEQ tablet Take 2 tablets (16 mEq total) by mouth daily. 06/30/22   Rollene Rotunda, MD  rosuvastatin (CRESTOR) 40 MG tablet TAKE 1 TABLET BY MOUTH DAILY AT 6 PM. 12/21/22   Rollene Rotunda, MD  SYNTHROID 88 MCG tablet TAKE 1 TABLET BY MOUTH EVERY DAY BEFORE BREAKFAST 12/21/22   McGowen, Maryjean Morn, MD      Allergies    Bacitracin-polymyxin b, Donepezil, Neosporin [neomycin-bacitracin zn-polymyx], Niacin, Other, Seasonal ic [cholestatin], Triamcinolone, Triamcinolone acetonide, Atorvastatin, Monascus purpureus went yeast, Pitavastatin, and Pravastatin    Review of Systems   Review of Systems  Constitutional:  Negative for chills and fever.  HENT:  Negative for ear pain and sore throat.   Eyes:  Negative for pain and visual disturbance.  Respiratory:  Negative for cough and  shortness of breath.   Cardiovascular:  Negative for chest pain and palpitations.  Gastrointestinal:  Positive for abdominal pain and nausea. Negative for vomiting.  Genitourinary:  Negative for dysuria and hematuria.  Musculoskeletal:  Positive for back pain. Negative for arthralgias.  Skin:  Negative for color change and rash.  Neurological:  Negative for seizures and syncope.  All other systems reviewed and are negative.   Physical Exam Updated Vital Signs BP (!) 154/69   Pulse (!) 48   Temp 98.2 F (36.8 C) (Oral)   Resp 16   SpO2  100%  Physical Exam Vitals and nursing note reviewed.  Constitutional:      General: She is not in acute distress.    Appearance: Normal appearance. She is well-developed.  HENT:     Head: Normocephalic and atraumatic.  Eyes:     Conjunctiva/sclera: Conjunctivae normal.  Cardiovascular:     Rate and Rhythm: Normal rate and regular rhythm.     Heart sounds: No murmur heard. Pulmonary:     Effort: Pulmonary effort is normal. No respiratory distress.     Breath sounds: Normal breath sounds.  Chest:     Chest wall: No tenderness.  Abdominal:     Palpations: Abdomen is soft.     Tenderness: There is no abdominal tenderness.  Musculoskeletal:        General: No swelling.     Cervical back: Normal range of motion and neck supple.  Skin:    General: Skin is warm and dry.     Capillary Refill: Capillary refill takes less than 2 seconds.  Neurological:     General: No focal deficit present.     Mental Status: She is alert and oriented to person, place, and time.  Psychiatric:        Mood and Affect: Mood normal.     ED Results / Procedures / Treatments   Labs (all labs ordered are listed, but only abnormal results are displayed) Labs Reviewed  COMPREHENSIVE METABOLIC PANEL - Abnormal; Notable for the following components:      Result Value   CO2 21 (*)    Calcium 8.5 (*)    Total Protein 6.4 (*)    Albumin 3.3 (*)    Alkaline Phosphatase 37 (*)    All other components within normal limits  CBC - Abnormal; Notable for the following components:   RBC 3.57 (*)    Hemoglobin 11.2 (*)    HCT 33.8 (*)    All other components within normal limits  LIPASE, BLOOD  URINALYSIS, ROUTINE W REFLEX MICROSCOPIC  TROPONIN I (HIGH SENSITIVITY)    EKG None  Radiology No results found.  Procedures Procedures    Medications Ordered in ED Medications  iohexol (OMNIPAQUE) 300 MG/ML solution 100 mL (has no administration in time range)    ED Course/ Medical Decision Making/  A&P                             Medical Decision Making Amount and/or Complexity of Data Reviewed Labs: ordered. Radiology: ordered.  Risk Prescription drug management.   Patient is a pain initially left upper quadrant with nausea.  Then this morning it was more left thoracic lateral and posterior.  That is resolved as well.  Will check labs will include cardiac evaluation as well because of the location.  Patient has CT scan abdomen and pelvis ordered.  No history of kidney stones.  Lipase  normal complete metabolic panel alk phos 37 otherwise negative CBC white count 5.5 hemoglobin 11.2 platelets 290.  Urinalysis pending troponins pending chest x-ray pending and CT pending.  Delta troponins x 2 without any acute findings.  CT scan abdomen pelvis just showed a lesion in the right kidney that is most likely benign patient made aware.  Will need close follow-up with primary care provider for that.  No explanation for the patient's pain.   Final Clinical Impression(s) / ED Diagnoses Final diagnoses:  Left upper quadrant abdominal pain    Rx / DC Orders ED Discharge Orders     None         Vanetta Mulders, MD 04/06/23 1610    Vanetta Mulders, MD 04/06/23 1116

## 2023-04-06 NOTE — Discharge Instructions (Signed)
Workup for the abdominal pain and the pain towards the back without any findings to explain the pain.  CT scan did have incidental finding of a lesion in the right kidney that is most likely benign but must be followed up on a routine basis to see if it grows.  Make appoint with your primary care doctor.  Return for any new or worse symptoms.  No evidence of any acute heart problems today.

## 2023-04-06 NOTE — ED Triage Notes (Signed)
Pt presents via POV c/o LUQ abd pain. Reports pain started last night but was relieved with Tums but pain began again this am.

## 2023-04-13 ENCOUNTER — Ambulatory Visit (INDEPENDENT_AMBULATORY_CARE_PROVIDER_SITE_OTHER): Payer: Medicare Other | Admitting: Family Medicine

## 2023-04-13 ENCOUNTER — Encounter: Payer: Self-pay | Admitting: Family Medicine

## 2023-04-13 VITALS — BP 106/64 | HR 63 | Wt 128.6 lb

## 2023-04-13 DIAGNOSIS — G8929 Other chronic pain: Secondary | ICD-10-CM

## 2023-04-13 DIAGNOSIS — M25562 Pain in left knee: Secondary | ICD-10-CM

## 2023-04-13 DIAGNOSIS — M1712 Unilateral primary osteoarthritis, left knee: Secondary | ICD-10-CM

## 2023-04-13 DIAGNOSIS — M069 Rheumatoid arthritis, unspecified: Secondary | ICD-10-CM

## 2023-04-13 MED ORDER — TRIAMCINOLONE ACETONIDE 40 MG/ML IJ SUSP
40.0000 mg | Freq: Once | INTRAMUSCULAR | Status: AC
Start: 2023-04-13 — End: 2023-04-13
  Administered 2023-04-13: 40 mg via INTRA_ARTICULAR

## 2023-04-13 MED ORDER — TRIAMCINOLONE ACETONIDE 40 MG/ML IJ SUSP
40.0000 mg | Freq: Once | INTRAMUSCULAR | Status: DC
Start: 2023-04-13 — End: 2023-04-13

## 2023-04-13 NOTE — Progress Notes (Signed)
OFFICE VISIT  04/13/2023  CC:  Chief Complaint  Patient presents with   Follow-up    ER follow up on stomach pain. She states she has not had much of an appetite and feels dizzy a lot.     Patient is a 82 y.o. female who presents accompanied by her husband Margretta Sidle for 1 week follow-up emergency department visit. She presented to [facility] ED on 04/06/2023 for left upper quadrant abdominal pain. Reviewed entire encounter data today.  CBC, c-Met, lipase all normal. UA unremarkable.  Troponins negative x 2. 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   Discharged home on no new medication.  INTERIM HX: She has had no recurrence of her pain. No rash.  She has stable lightheadedness that is worse upon standing.  She has no falls, no palpitations, no vertigo. She admits she does not hydrate well.  Has had gradually worsening left knee pain over the last several months at least. No redness or swelling or warmth.  It seems to audibly pop and acutely worsen at times. No recent injury.  No particular problem with other joints at this time. She does have RA but I think this has been well-controlled on methotrexate followed by her rheumatologist.  No known past history of significant knee problem.  She does not recall any injections in the knee in the past.  I see no imaging of the knee in her EMR.   ROS as above, plus--> +chronic short-term memory problems.  No fevers, no CP, no SOB, no wheezing, no cough, no HAs, no rashes, no melena/hematochezia.  No polyuria or polydipsia.  No myalgias. No focal weakness, paresthesias, or tremors.  No acute vision or hearing abnormalities.  No dysuria or unusual/new urinary urgency or frequency.  No n/v/d or abd pain.  No palpitations.    Past Medical History:  Diagnosis Date   Angiomyolipoma of left kidney     03/2023 (no f/u imaging needed)   Beta thalassemia, heterozygous    ?alpha? pt does not recall.   Bradycardia    mild sinus   CAD (coronary artery disease) 07/2018   NSTEMI (small vessel occlusion, not amenable to intervention)-->2V CAD on cath, preserved LV systolic fxn.   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  Surgical path NEG for atypica or malignancy.   Memory difficulties 12/25/2020   MGUS (monoclonal gammopathy of unknown significance) 2022   Dr. Myna Hidalgo eval 02/2021-->he feels it is likely reactive d/t her RA.  Low suspicion of plasma cell dyscrasia-->f/u 31mo.   NSTEMI (non-ST elevated myocardial infarction) (HCC) 07/2018   Dr. Antoine Poche   Osteoporosis 01/2021   01/2021 T scoree -2.7   Palpitations    08/2018 Event monitor normal. Long term event monitor 05/2020 w/out signif arrhythmia--some brief SVT.   Rheumatoid arthritis (HCC) 2007   muscle weaknes at times (Dr. Dierdre Forth)   Tinnitus, bilateral 2021   with high frequ SSHL->reassured by Ent, reassured  by Penelope Coop 2024    Past Surgical History:  Procedure Laterality Date   ABDOMINAL HYSTERECTOMY  1987   DUB, no malignancy.  Ovaries still in.   BLADDER REPAIR     Cardiac Event Monitor  08/22/2018   NO arrhythmias 2019 or 2024 (sinus brady)   CHOLECYSTECTOMY  1969   COLONOSCOPY  11/07/2017   2014 adenoma.  Rpt 10/2017 adenoma x 1.  No further colonoscopies needed per GI.    DEXA  01/2021   T score -2.7   LEFT HEART CATH AND CORONARY ANGIOGRAPHY N/A 08/08/2018   Procedure: LEFT HEART CATH AND CORONARY ANGIOGRAPHY;   Surgeon: Lennette Bihari, MD;  Location: [facility]VASIVE [facility];  Service: Cardiovascular;  Laterality: N/A;   THYROIDECTOMY  2012   cytologic atypia on nodule bx; surgical path NEG for atypica or malignancy.   TRANSTHORACIC ECHOCARDIOGRAM  08/07/2018   2019 EF 60-65%, no wall motion abnl, grd II DD, mod MR.  01/2022 mild AI and MR   UPPER GASTROINTESTINAL ENDOSCOPY     VAGINAL PROLAPSE REPAIR  2012    Outpatient Medications Prior to Visit  Medication Sig Dispense Refill   acetaminophen (TYLENOL) 500 MG tablet Take 500 mg by mouth every 6 (six) hours as needed for headache (pain).     alendronate (FOSAMAX) 70 MG tablet TAKE 1 TABLET BY MOUTH ONCE A WEEK. TAKE WITH A FULL GLASS OF WATER ON AN EMPTY STOMACH. 12 tablet 3   aspirin EC 81 MG tablet Take 81 mg by mouth at bedtime.     Calcium Citrate-Vitamin D (CALCIUM CITRATE + D PO) Take 1 tablet by mouth 2 (two) times daily.      Coenzyme Q10 (CO Q 10) 60 MG CAPS 1 capsule with a meal     diphenhydrAMINE (BENADRYL) 25 mg capsule 1 capsule as needed     fexofenadine (ALLEGRA) 180 MG tablet      folic acid (FOLVITE) 1 MG tablet Take 1 mg by mouth 2 (two) times daily.     isosorbide mononitrate (IMDUR) 60 MG 24 hr tablet TAKE 1 TABLET BY MOUTH EVERY DAY 90 tablet 3   methotrexate 2.5 MG tablet Take 12.5 mg by mouth every Sunday.      Multiple Vitamin (MULTIVITAMIN WITH MINERALS) TABS tablet Take 1 tablet by mouth daily.     nitroGLYCERIN (NITROSTAT) 0.4 MG SL tablet Place 1 tablet (0.4 mg total) under the tongue every 5 (five) minutes x 3 doses as needed for chest pain. 25 tablet 3   potassium chloride (KLOR-CON) 8 MEQ tablet Take 2 tablets (16 mEq total) by mouth daily. 180 tablet 3   rosuvastatin (CRESTOR) 40 MG tablet TAKE 1 TABLET BY MOUTH DAILY AT 6 PM. 90 tablet 3   SYNTHROID 88 MCG tablet TAKE 1 TABLET BY MOUTH EVERY DAY BEFORE BREAKFAST 90 tablet 1   escitalopram (LEXAPRO) 5 MG tablet Take 1 tablet by mouth daily. (Patient not taking: Reported  on 04/13/2023)     No facility-administered medications prior to visit.    Allergies  Allergen Reactions   Bacitracin-Polymyxin B     Other reaction(s): Unknown Other reaction(s): Other (See Comments) unknown   Donepezil     Vivid dreams Other reaction(s): Other (See Comments) Vivid Dreams   Neosporin [Neomycin-Bacitracin Zn-Polymyx] Other (See Comments)    Patient states that wounds or scratches never heal. (pt uses bacitracin)    Niacin     Other reaction(s): Myal;gia Other reaction(s): Myalgias (intolerance)   Other  Other reaction(s): Unknown   Seasonal Ic [Cholestatin] Other (See Comments)    Leg cramps   Triamcinolone Other (See Comments)    Burning sensation   Triamcinolone Acetonide     Other reaction(s): Unknown   Atorvastatin Other (See Comments)    Leg cramps Other reaction(s): Myalgia   Monascus Purpureus Went Yeast Other (See Comments)    "Red Yeast Rice" causes leg cramps Other reaction(s): Myalgia   Pitavastatin     Other reaction(s): Myalgia Other reaction(s): Cramps (ALLERGY/intolerance), Myalgias (intolerance)   Pravastatin Other (See Comments)    Leg cramps    Review of Systems As per HPI  PE:    04/13/2023    2:54 PM 04/06/2023   10:30 AM 04/06/2023   10:00 AM  Vitals with BMI  Weight 128 lbs 10 oz    Systolic 106 149 865  Diastolic 64 58 62  Pulse 63 47 47     Physical Exam  Gen: Alert, well appearing.  Patient is oriented to person, place, time, and situation. AFFECT: pleasant, lucid thought and speech. LEFT KNEE: No swelling, erythema, or warmth. She is mildly tender to palpation over the peripatellar area and significantly tender over the lateral joint line.  She has full range of motion actively and passively.  No instability.  McMurray' positive for reproduction of her lateral knee pain. Extremities show no edema.  LABS:  Last CBC Lab Results  Component Value Date   WBC 5.5 04/06/2023   HGB 11.2 (L) 04/06/2023   HCT  33.8 (L) 04/06/2023   MCV 94.7 04/06/2023   MCH 31.4 04/06/2023   RDW 14.3 04/06/2023   PLT 290 04/06/2023   Lab Results  Component Value Date   IRON 67 06/14/2012   Last metabolic panel Lab Results  Component Value Date   GLUCOSE 95 04/06/2023   NA 139 04/06/2023   K 4.0 04/06/2023   CL 110 04/06/2023   CO2 21 (L) 04/06/2023   BUN 11 04/06/2023   CREATININE 0.69 04/06/2023   GFRNONAA >60 04/06/2023   CALCIUM 8.5 (L) 04/06/2023   PROT 6.4 (L) 04/06/2023   ALBUMIN 3.3 (L) 04/06/2023   LABGLOB 3.1 08/27/2021   AGRATIO 1.2 08/27/2021   BILITOT 0.7 04/06/2023   ALKPHOS 37 (L) 04/06/2023   AST 20 04/06/2023   ALT 12 04/06/2023   ANIONGAP 8 04/06/2023   Lab Results  Component Value Date   LIPASE 33 04/06/2023   IMPRESSION AND PLAN:  #1 chronic nonspecific disequilibrium syndrome.  She does have a component of orthostatic dizziness. Her blood pressure is low end of normal today. Encouraged better hydration.  #2 abdominal pain, isolated episode, unknown etiology. Emergency department evaluation normal on 04/06/2023. No recurrence.  #3 left knee pain, chronic and progressive. Suspect osteoarthritis.  Does not appear to be active rheumatoid arthritis flare Bedside [facility] ultrasound today: Very small suprapatellar effusion.  No hyperemia. Degenerative changes lateral joint line.  No meniscal tear visible. Pt was in favor of steroid injection today. Knee radiographs ordered as well.  Ultrasound-guided injection is preferred based on studies that show increased duration, increased effect, greater accuracy, decreased procedural pain, increased response rate, and decreased cost with ultrasound-guided versus blind injection. Procedure: Real-time ultrasound guided injection of suprapatellar pouch of left knee. Device: GE [employer] informed consent obtained.  Timeout conducted.  No overlying erythema, induration, or other signs of local infection. After sterile prep with  Betadine, injected 3 cc of 1% plain lidocaine for local anesthesia followed by  a mixture of 40 mg Kenalog and 3 cc 1% plain lidocaine.  Injectate seen filling suprapatellar space. Patient tolerated the procedure well.  No immediate complications.  Post-injection care discussed. Advised to call if fever/chills, erythema, drainage, or persistent bleeding.  Impression: Technically successful ultrasound-guided injection.  An After Visit Summary was printed and given to the patient.  FOLLOW UP: Return in about 2 weeks (around 04/27/2023) for f/u L knee pain.  Signed:  Santiago Bumpers, MD           04/13/2023

## 2023-04-14 ENCOUNTER — Ambulatory Visit (HOSPITAL_BASED_OUTPATIENT_CLINIC_OR_DEPARTMENT_OTHER)
Admission: RE | Admit: 2023-04-14 | Discharge: 2023-04-14 | Disposition: A | Discharge status: Home / Self Care | Payer: Medicare Other | Source: Ambulatory Visit | Attending: Family Medicine | Admitting: Family Medicine

## 2023-04-14 DIAGNOSIS — M25562 Pain in left knee: Secondary | ICD-10-CM | POA: Insufficient documentation

## 2023-04-14 DIAGNOSIS — M1712 Unilateral primary osteoarthritis, left knee: Secondary | ICD-10-CM | POA: Insufficient documentation

## 2023-04-14 DIAGNOSIS — M069 Rheumatoid arthritis, unspecified: Secondary | ICD-10-CM | POA: Diagnosis not present

## 2023-04-14 DIAGNOSIS — G8929 Other chronic pain: Secondary | ICD-10-CM | POA: Diagnosis not present

## 2023-04-14 DIAGNOSIS — M25462 Effusion, left knee: Secondary | ICD-10-CM | POA: Diagnosis not present

## 2023-04-20 ENCOUNTER — Encounter: Payer: Self-pay | Admitting: Family Medicine

## 2023-04-28 ENCOUNTER — Other Ambulatory Visit: Payer: Self-pay | Admitting: Cardiology

## 2023-04-28 NOTE — Patient Instructions (Signed)

## 2023-04-29 ENCOUNTER — Encounter: Payer: Self-pay | Admitting: Family Medicine

## 2023-04-29 ENCOUNTER — Other Ambulatory Visit: Payer: Self-pay | Admitting: Cardiology

## 2023-04-29 ENCOUNTER — Ambulatory Visit (INDEPENDENT_AMBULATORY_CARE_PROVIDER_SITE_OTHER): Payer: Medicare Other | Admitting: Family Medicine

## 2023-04-29 VITALS — BP 109/68 | HR 59 | Temp 98.3°F | Wt 124.2 lb

## 2023-04-29 DIAGNOSIS — M25562 Pain in left knee: Secondary | ICD-10-CM

## 2023-04-29 DIAGNOSIS — M1712 Unilateral primary osteoarthritis, left knee: Secondary | ICD-10-CM

## 2023-04-29 DIAGNOSIS — G8929 Other chronic pain: Secondary | ICD-10-CM | POA: Diagnosis not present

## 2023-04-29 DIAGNOSIS — M810 Age-related osteoporosis without current pathological fracture: Secondary | ICD-10-CM

## 2023-04-29 MED ORDER — POTASSIUM CHLORIDE ER 8 MEQ PO TBCR: 16.0000 meq | EXTENDED_RELEASE_TABLET | Freq: Every day | ORAL | 3 refills | Status: DC

## 2023-04-29 MED ORDER — IBANDRONATE SODIUM 150 MG PO TABS: 150.0000 mg | ORAL_TABLET | ORAL | 4 refills | Status: AC

## 2023-04-29 NOTE — Progress Notes (Signed)
OFFICE VISIT  04/29/2023  CC:  Chief Complaint  Bonnie [name] presents with   Follow-up    Follow up on knee pain. She states it is better but it still pops. She has been wearing a knee brace.    Bonnie [name] is a 82 y.o. female who presents accompanied by her husband Vinnie for 2-week follow-up left knee pain. A/P as of last visit: " left knee pain, chronic and progressive. Suspect osteoarthritis.  Does not appear to be active rheumatoid arthritis flare Bedside MSK ultrasound today: Very small suprapatellar effusion.  No hyperemia. Degenerative changes lateral joint line.  No meniscal tear visible. Pt was in favor of steroid injection today. Knee radiographs ordered as well."  INTERIM HX: Left knee without pain now.  She has some popping when she stands up after being seated for quite a while.  She wears a soft knee support and this helps.  She has no locking up or buckling. Left knee radiograph on 04/14/2023 showed lateral joint space narrowing and osteophytes.  Small suprapatellar effusion. No erosive changes.  She says her alendronate tab is so big that it causes her choking when trying to take it.   Past Medical History:  Diagnosis Date   Angiomyolipoma of left kidney    03/2023 (no f/u imaging needed)   Beta thalassemia, heterozygous    ?alpha? pt does not recall.   Bradycardia    mild sinus   CAD (coronary artery disease) 07/2018   NSTEMI (small vessel occlusion, not amenable to intervention)-->2V CAD on cath, preserved LV systolic fxn.   Diastolic dysfunction 07/2018   Diverticulosis    Fatigue    GERD (gastroesophageal reflux disease)    Hemorrhoids    History of adenomatous polyp of colon    History of COVID-19 03/2021   +chronic loss of smell and taste   Hyperlipidemia    Hypothyroidism, postsurgical 2012   thyroidectomy due to bx of dominant nodule showing cytologic atypia that could be indicative of early thyroid cancer (follicular variant of papillary thyroid  carcinoma).  Surgical path NEG for atypica or malignancy.   Memory difficulties 12/25/2020   MGUS (monoclonal gammopathy of unknown significance) 2022   Dr. Myna Hidalgo eval 02/2021-->he feels it is likely reactive d/t her RA.  Low suspicion of plasma cell dyscrasia-->f/u 69mo.   NSTEMI (non-ST elevated myocardial infarction) (HCC) 07/2018   Dr. Antoine Poche   Osteoarthritis of left knee    Osteoporosis 01/2021   01/2021 T scoree -2.7   Palpitations    08/2018 Event monitor normal. Long term event monitor 05/2020 w/out signif arrhythmia--some brief SVT.   Rheumatoid arthritis (HCC) 2007   muscle weaknes at times (Dr. Dierdre Forth)   Tinnitus, bilateral 2021   with high frequ SSHL->reassured by Ent, reassured by [facility] 2024    Past Surgical History:  Procedure Laterality Date   ABDOMINAL HYSTERECTOMY  1987   DUB, no malignancy.  Ovaries still in.   BLADDER REPAIR     Cardiac Event Monitor  08/22/2018   NO arrhythmias 2019 or 2024 (sinus brady)   CHOLECYSTECTOMY  1969   COLONOSCOPY  11/07/2017   2014 adenoma.  Rpt 10/2017 adenoma x 1.  No further colonoscopies needed per GI.    DEXA  01/2021   T score -2.7   LEFT HEART CATH AND CORONARY ANGIOGRAPHY N/A 08/08/2018   Procedure: LEFT HEART CATH AND CORONARY ANGIOGRAPHY;  Surgeon: Lennette Bihari, MD;  Location: [facility];  Service: Cardiovascular;  Laterality: N/A;  THYROIDECTOMY  2012   cytologic atypia on nodule bx; surgical path NEG for atypica or malignancy.   TRANSTHORACIC ECHOCARDIOGRAM  08/07/2018   2019 EF 60-65%, no wall motion abnl, grd II DD, mod MR.  01/2022 mild AI and MR   UPPER GASTROINTESTINAL ENDOSCOPY     VAGINAL PROLAPSE REPAIR  2012    Outpatient Medications Prior to Visit  Medication Sig Dispense Refill   acetaminophen (TYLENOL) 500 MG tablet Take 500 mg by mouth every 6 (six) hours as needed for headache (pain).     aspirin EC 81 MG tablet Take 81 mg by mouth at bedtime.     Calcium Citrate-Vitamin D (CALCIUM  CITRATE + D PO) Take 1 tablet by mouth 2 (two) times daily.      Coenzyme Q10 (CO Q 10) 60 MG CAPS 1 capsule with a meal     diphenhydrAMINE (BENADRYL) 25 mg capsule 1 capsule as needed     fexofenadine (ALLEGRA) 180 MG tablet      folic acid (FOLVITE) 1 MG tablet Take 1 mg by mouth 2 (two) times daily.     isosorbide mononitrate (IMDUR) 60 MG 24 hr tablet TAKE 1 TABLET BY MOUTH EVERY DAY 90 tablet 3   methotrexate 2.5 MG tablet Take 12.5 mg by mouth every Sunday.      Multiple Vitamin (MULTIVITAMIN WITH MINERALS) TABS tablet Take 1 tablet by mouth daily.     nitroGLYCERIN (NITROSTAT) 0.4 MG SL tablet Place 1 tablet (0.4 mg total) under the tongue every 5 (five) minutes x 3 doses as needed for chest pain. 25 tablet 3   rosuvastatin (CRESTOR) 40 MG tablet TAKE 1 TABLET BY MOUTH DAILY AT 6 PM. 90 tablet 3   SYNTHROID 88 MCG tablet TAKE 1 TABLET BY MOUTH EVERY DAY BEFORE BREAKFAST 90 tablet 1   alendronate (FOSAMAX) 70 MG tablet TAKE 1 TABLET BY MOUTH ONCE A WEEK. TAKE WITH A FULL GLASS OF WATER ON AN EMPTY STOMACH. 12 tablet 3   potassium chloride (KLOR-CON) 8 MEQ tablet TAKE 2 TABLETS (16 MEQ TOTAL) BY MOUTH DAILY. 180 tablet 3   escitalopram (LEXAPRO) 5 MG tablet Take 1 tablet by mouth daily. (Bonnie [name] not taking: Reported on 04/13/2023)     No facility-administered medications prior to visit.    Allergies  Allergen Reactions   Bacitracin-Polymyxin B     Other reaction(s): Unknown Other reaction(s): Other (See Comments) unknown   Donepezil     Vivid dreams Other reaction(s): Other (See Comments) Vivid Dreams   Neosporin [Neomycin-Bacitracin Zn-Polymyx] Other (See Comments)    Bonnie [name] states that wounds or scratches never heal. 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  Bonnie [name] is oriented to person, place, time, and situation. No further exam today  LABS:  Last CBC Lab Results  Component Value Date   WBC 5.5 04/06/2023   HGB 11.2 (L) 04/06/2023   HCT 33.8 (L) 04/06/2023   MCV 94.7 04/06/2023   MCH 31.4 04/06/2023   RDW 14.3 04/06/2023   PLT 290 04/06/2023   Last metabolic panel Lab Results  Component Value Date   GLUCOSE 95 04/06/2023   NA 139 04/06/2023   K 4.0 04/06/2023   CL 110 04/06/2023   CO2 21 (L) 04/06/2023   BUN 11 04/06/2023   CREATININE 0.69 04/06/2023   GFRNONAA >60 04/06/2023   CALCIUM 8.5 (L) 04/06/2023   PROT 6.4 (L) 04/06/2023   ALBUMIN 3.3 (L) 04/06/2023   LABGLOB 3.1 08/27/2021   AGRATIO 1.2 08/27/2021   BILITOT 0.7 04/06/2023   ALKPHOS 37 (L) 04/06/2023   AST 20 04/06/2023   ALT 12 04/06/2023   ANIONGAP 8 04/06/2023   Lab Results  Component Value Date   TSH 1.810 11/01/2022   IMPRESSION AND PLAN:  #1 left knee pain, osteoarthritis.  Improved status post steroid injection. Continue soft knee support as needed.  2.  Osteoporosis.  Alendronate causes choking when she tries to swallow it. Will change to Boniva 150 mg tab once a month.  An After Visit Summary was printed and given to the Bonnie [name].  FOLLOW  UP: Return for keep appt set for 07/21/23.  Signed:  Santiago Bumpers, MD           04/29/2023

## 2023-05-12 ENCOUNTER — Ambulatory Visit (INDEPENDENT_AMBULATORY_CARE_PROVIDER_SITE_OTHER): Payer: Medicare Other | Admitting: Family Medicine

## 2023-05-12 ENCOUNTER — Encounter: Payer: Self-pay | Admitting: Family Medicine

## 2023-05-12 VITALS — BP 107/67 | HR 56 | Temp 98.0°F | Ht 67.0 in | Wt 125.2 lb

## 2023-05-12 DIAGNOSIS — R634 Abnormal weight loss: Secondary | ICD-10-CM

## 2023-05-12 DIAGNOSIS — R432 Parageusia: Secondary | ICD-10-CM | POA: Diagnosis not present

## 2023-05-12 DIAGNOSIS — G3184 Mild cognitive impairment, so stated: Secondary | ICD-10-CM | POA: Diagnosis not present

## 2023-05-12 NOTE — Progress Notes (Signed)
OFFICE VISIT  05/12/2023  CC:  Chief Complaint  Patient presents with   Weight Loss    Patient is a 82 y.o. female who presents accompanied by her husband Vinnie for weight loss and memory loss.  HPI: Family wanted her to check in and discuss declining memory again. She is aware/admits mem dec but gradual, nothing acute change. Denies depressed mood. No headaches, no halluc/delusions, no wandering.  Recognizes family, friends, etc.  Also grad wt loss->about 10 lb in last 2 years, has had longstanding (post 2022 covid illness) lack of taste sensation, has occ postprandial epig discomfort, no n/v/d. No dysphagia.    ROS as above, plus--> no fevers, no CP, no SOB, no wheezing, no cough, no rashes, no melena/hematochezia.  No polyuria or polydipsia.  No myalgias or arthralgias.  No focal weakness, paresthesias, or tremors.  No acute vision or hearing abnormalities.  No dysuria or unusual/new urinary urgency or frequency.  No recent changes in lower legs. No palpitations.     Past Medical History:  Diagnosis Date   Angiomyolipoma of left kidney    03/2023 (no f/u imaging needed)   Beta thalassemia, heterozygous    ?alpha? pt does not recall.   Bradycardia    mild sinus   CAD (coronary artery disease) 07/2018   NSTEMI (small vessel occlusion, not amenable to intervention)-->2V CAD on cath, preserved LV systolic fxn.   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  Surgical path NEG for atypica or malignancy.   Memory difficulties 12/25/2020   MGUS (monoclonal gammopathy of unknown  significance) 2022   Dr. Myna Hidalgo eval 02/2021-->he feels it is likely reactive d/t her RA.  Low suspicion of plasma cell dyscrasia-->f/u 1mo.   NSTEMI (non-ST elevated myocardial infarction) (HCC) 07/2018   Dr. Antoine Poche   Osteoarthritis of left knee    Osteoporosis 01/2021   01/2021 T scoree -2.7   Palpitations    08/2018 Event monitor normal. Long term event monitor 05/2020 w/out signif arrhythmia--some brief SVT.   Rheumatoid arthritis (HCC) 2007   muscle weaknes at times (Dr. Dierdre Forth)   Tinnitus, bilateral 2021   with high frequ SSHL->reassured by Ent, reassured by [facility] audiol 2024    Past Surgical History:  Procedure Laterality Date   ABDOMINAL HYSTERECTOMY  1987   DUB, no malignancy.  Ovaries still in.   BLADDER REPAIR     Cardiac Event Monitor  08/22/2018   NO arrhythmias 2019 or 2024 (sinus brady)   CHOLECYSTECTOMY  1969   COLONOSCOPY  11/07/2017   2014 adenoma.  Rpt 10/2017 adenoma x 1.  No further colonoscopies needed per GI.    DEXA  01/2021   T score -2.7   LEFT HEART CATH AND CORONARY ANGIOGRAPHY N/A 08/08/2018   Procedure: LEFT HEART CATH AND CORONARY ANGIOGRAPHY;  Surgeon: Lennette Bihari, MD;  Location: [facility];  Service: Cardiovascular;  Laterality: N/A;   THYROIDECTOMY  2012   cytologic atypia on nodule bx; surgical path NEG for atypica or malignancy.   TRANSTHORACIC ECHOCARDIOGRAM  08/07/2018   2019 EF 60-65%,  no wall motion abnl, grd II DD, mod MR.  01/2022 mild AI and MR   UPPER GASTROINTESTINAL ENDOSCOPY     VAGINAL PROLAPSE REPAIR  2012    Outpatient Medications Prior to Visit  Medication Sig Dispense Refill   aspirin EC 81 MG tablet Take 81 mg by mouth at bedtime.     Calcium Citrate-Vitamin D (CALCIUM CITRATE + D PO) Take 1 tablet by mouth 2 (two) times daily.      Coenzyme Q10 (CO Q 10) 60 MG CAPS 1 capsule with a meal     fexofenadine (ALLEGRA) 180 MG tablet Take 180 mg by mouth as needed.     folic acid (FOLVITE) 1 MG tablet Take 1 mg by  mouth 2 (two) times daily.     ibandronate (BONIVA) 150 MG tablet Take 1 tablet (150 mg total) by mouth every 30 (thirty) days. Take in the morning with a full glass of water, on an empty stomach, and do not take anything else by mouth or lie down for the next 30 min. 3 tablet 4   isosorbide mononitrate (IMDUR) 60 MG 24 hr tablet TAKE 1 TABLET BY MOUTH EVERY DAY 90 tablet 2   methotrexate 2.5 MG tablet Take 12.5 mg by mouth every Sunday.      Multiple Vitamin (MULTIVITAMIN WITH MINERALS) TABS tablet Take 1 tablet by mouth daily.     potassium chloride (KLOR-CON) 8 MEQ tablet Take 2 tablets (16 mEq total) by mouth daily. 180 tablet 3   rosuvastatin (CRESTOR) 40 MG tablet TAKE 1 TABLET BY MOUTH DAILY AT 6 PM. 90 tablet 3   SYNTHROID 88 MCG tablet TAKE 1 TABLET BY MOUTH EVERY DAY BEFORE BREAKFAST 90 tablet 1   acetaminophen (TYLENOL) 500 MG tablet Take 500 mg by mouth every 6 (six) hours as needed for headache (pain). (Patient not taking: Reported on 05/12/2023)     diphenhydrAMINE (BENADRYL) 25 mg capsule 1 capsule as needed (Patient not taking: Reported on 05/12/2023)     nitroGLYCERIN (NITROSTAT) 0.4 MG SL tablet Place 1 tablet (0.4 mg total) under the tongue every 5 (five) minutes x 3 doses as needed for chest pain. (Patient not taking: Reported on 05/12/2023) 25 tablet 3   No facility-administered medications prior to visit.    Allergies  Allergen Reactions   Bacitracin-Polymyxin B     Other reaction(s): Unknown Other reaction(s): Other (See Comments) unknown   Donepezil     Vivid dreams Other reaction(s): Other (See Comments) Vivid Dreams   Neosporin [Neomycin-Bacitracin Zn-Polymyx] Other (See Comments)    Patient states that wounds or scratches never heal. (pt uses bacitracin)    Niacin     Other reaction(s): Myal;gia Other reaction(s): Myalgias (intolerance)   Other     Other reaction(s): Unknown   Seasonal Ic [Cholestatin] Other (See Comments)    Leg cramps   Triamcinolone Other  (See Comments)    Burning sensation   Triamcinolone Acetonide     Other reaction(s): Unknown   Atorvastatin Other (See Comments)    Leg cramps Other reaction(s): Myalgia   Monascus Purpureus Went Yeast Other (See Comments)    "Red Yeast Rice" causes leg cramps Other reaction(s): Myalgia   Pitavastatin     Other reaction(s): Myalgia Other reaction(s): Cramps (ALLERGY/intolerance), Myalgias (intolerance)   Pravastatin Other (See Comments)    Leg cramps    Review of Systems  As per HPI  PE:    05/12/2023   11:11 AM 04/29/2023    9:31 AM  04/13/2023    2:54 PM  Vitals with BMI  Height 5\' 7"     Weight 125 lbs 3 oz 124 lbs 3 oz 128 lbs 10 oz  BMI 19.6    Systolic 107 109 409  Diastolic 67 68 64  Pulse 56 59 63   Physical Exam  Gen: Alert, well appearing.  Patient is oriented to person, place, time, and situation. AFFECT: pleasant, lucid thought and speech. No further exam today  LABS:  Last CBC Lab Results  Component Value Date   WBC 5.5 04/06/2023   HGB 11.2 (L) 04/06/2023   HCT 33.8 (L) 04/06/2023   MCV 94.7 04/06/2023   MCH 31.4 04/06/2023   RDW 14.3 04/06/2023   PLT 290 04/06/2023   Lab Results  Component Value Date   IRON 67 06/14/2012   Last metabolic panel Lab Results  Component Value Date   GLUCOSE 95 04/06/2023   NA 139 04/06/2023   K 4.0 04/06/2023   CL 110 04/06/2023   CO2 21 (L) 04/06/2023   BUN 11 04/06/2023   CREATININE 0.69 04/06/2023   GFRNONAA >60 04/06/2023   CALCIUM 8.5 (L) 04/06/2023   PROT 6.4 (L) 04/06/2023   ALBUMIN 3.3 (L) 04/06/2023   LABGLOB 3.1 08/27/2021   AGRATIO 1.2 08/27/2021   BILITOT 0.7 04/06/2023   ALKPHOS 37 (L) 04/06/2023   AST 20 04/06/2023   ALT 12 04/06/2023   ANIONGAP 8 04/06/2023   Last lipids Lab Results  Component Value Date   CHOL 144 01/18/2023   HDL 69.50 01/18/2023   LDLCALC 63 01/18/2023   LDLDIRECT 155.5 12/04/2012   TRIG 54.0 01/18/2023   CHOLHDL 2 01/18/2023   Last hemoglobin A1c Lab  Results  Component Value Date   HGBA1C 5.7 (H) 08/07/2018   Last thyroid functions Lab Results  Component Value Date   TSH 1.810 11/01/2022   Last vitamin D Lab Results  Component Value Date   VD25OH 62.80 01/18/2023   Last vitamin B12 and Folate Lab Results  Component Value Date   VITAMINB12 527 12/25/2020   FOLATE 12.7 06/28/2007   IMPRESSION AND PLAN:  1) Short term memory loss.  Gradual onset and progression. She remains fully functional. Has been followed by Dr. Anne Hahn (retired), neuro, in the past-->until about 2022. MR w/out contrast 12/2020 w/nonspecific mild/mod generalized cortical atrophy, most pronounced in medial temporal lobes.  Mild chronic microvascular ischemic changes. Pt/husband/family request new referral to neuro, ordered today.  2) Wt loss, suspect d/t dec caloric intake from lack of taste+cognitive decline. No red flags. Nutritionist helped some in the past-->[facility] [place]  She will call if help needed with referral.  An After Visit Summary was printed and given to the patient.  FOLLOW UP: No follow-ups on file.  Signed:  Santiago Bumpers, MD           05/12/2023

## 2023-06-01 ENCOUNTER — Ambulatory Visit: Payer: Medicare Other | Admitting: Cardiology

## 2023-06-02 ENCOUNTER — Ambulatory Visit: Payer: Medicare Other | Admitting: Cardiology

## 2023-06-07 NOTE — Progress Notes (Unsigned)
Cardiology Office Note:   Date:  06/08/2023  ID:  Bonnie [name], DOB [birth year], MRN [id number] PCP: Jeoffrey Massed, MD   [facility] Providers Cardiologist:  Rollene Rotunda, MD {  History of Present Illness:   Bonnie [name] is a 82 y.o. female who presents for evaluation of chest pain.  She was in the hospital in 08/06/2018 describing heavy pressure midsternal with associated nausea. EKG in ED was abnormal with inverted T-wave in Lead III.    She had cardiac cath on 08/08/2018 revealing 2 vessel disease with 60-65 % in the mLAD, and mRCA with an abrupt cut off in the mid posterior lateral vessel which was a small caliber vessel and suspected to be the culprit vessel with normal L Cx. She did not require intervention but was treated with DAPT with ASA and Brilinta, with aggressive lipid lowering recommended with increased dose of Crestor.  She was noted to have moderate MR and mild AI on echo with normal LV function .  She wore a monitor for palpitations in August 2021, this revealed no sustained arrhythmias, brief runs of SVT, no changes were made to her plan of care.   She had bradycardia and dizziness.  Repeat echo  was obtained which showed EF of 60 to 65% in 2023 with mild LVH, mild MR, mild AI.  She has done well since I saw her.  She is had no chest pressure, neck or arm discomfort she has had no shortness of breath, PND or orthopnea.  She rarely has any palpitations but they are not bothering her like they were before.  She has some mild orthostatic symptoms.  ROS: As stated in the HPI and negative for all other systems.  Studies Reviewed:    EKG:   NA  Risk Assessment/Calculations:              Physical Exam:   VS:  BP 104/70   Pulse 65   Ht 5\' 7"  (1.702 m)   Wt 124 lb 12.8 oz (56.6 kg)   SpO2 99%   BMI 19.55 kg/m    Wt Readings from Last 3 Encounters:  06/08/23 124 lb 12.8 oz (56.6 kg)  05/12/23 125 lb 3.2 oz (56.8 kg)  04/29/23 124 lb 3.2 oz (56.3 kg)      GEN: Well nourished, well developed in no acute distress NECK: No JVD; No carotid bruits CARDIAC: RRR, no murmurs, rubs, gallops RESPIRATORY:  Clear to auscultation without rales, wheezing or rhonchi  ABDOMEN: Soft, non-tender, non-distended EXTREMITIES:  No edema; No deformity   ASSESSMENT AND PLAN:   CAD:   The patient has no new sypmtoms.  No further cardiovascular testing is indicated.  We will continue with aggressive risk reduction and meds as listed.   MR:  This was mild in 2023.  No further imaging.  AI:   This was mild in 2023.  No further imaging.  PALPITATIONS:   These are not as problematic as they were.  No change in therapy.   DYSLIPDEMIA: Her LDL was 63 with an HDL of 70.  No change in therapy.  DIZZINESS: She has had some mild orthostatic symptoms and we talked about precautions.  No change in therapy.  She does have some bradycardia she seems to tolerate this.        Follow up with me in 12 months.   Signed, Rollene Rotunda, MD

## 2023-06-08 ENCOUNTER — Ambulatory Visit: Payer: Medicare Other | Attending: Cardiology | Admitting: Cardiology

## 2023-06-08 ENCOUNTER — Encounter: Payer: Self-pay | Admitting: Cardiology

## 2023-06-08 VITALS — BP 104/70 | HR 65 | Ht 67.0 in | Wt 124.8 lb

## 2023-06-08 DIAGNOSIS — E785 Hyperlipidemia, unspecified: Secondary | ICD-10-CM | POA: Insufficient documentation

## 2023-06-08 DIAGNOSIS — I340 Nonrheumatic mitral (valve) insufficiency: Secondary | ICD-10-CM | POA: Insufficient documentation

## 2023-06-08 DIAGNOSIS — R42 Dizziness and giddiness: Secondary | ICD-10-CM | POA: Diagnosis not present

## 2023-06-08 DIAGNOSIS — I351 Nonrheumatic aortic (valve) insufficiency: Secondary | ICD-10-CM | POA: Insufficient documentation

## 2023-06-08 DIAGNOSIS — I2510 Atherosclerotic heart disease of native coronary artery without angina pectoris: Secondary | ICD-10-CM | POA: Insufficient documentation

## 2023-06-08 DIAGNOSIS — R002 Palpitations: Secondary | ICD-10-CM | POA: Diagnosis not present

## 2023-06-08 MED ORDER — NITROGLYCERIN 0.4 MG SL SUBL: 0.4000 mg | SUBLINGUAL_TABLET | SUBLINGUAL | 3 refills | Status: AC | PRN

## 2023-06-08 NOTE — Patient Instructions (Signed)
Medication Instructions:  NO CHANGES  *If you need a refill on your cardiac medications before your next appointment, please call your pharmacy*   Follow-Up: At [facility], you and your health needs are our priority.  As part of our continuing mission to provide you with exceptional heart care, we have created designated Provider Care Teams.  These Care Teams include your primary Cardiologist (physician) and Advanced Practice Providers (APPs -  Physician Assistants and Nurse Practitioners) who all work together to provide you with the care you need, when you need it.  We recommend signing up for the patient portal called "MyChart".  Sign up information is provided on this After Visit Summary.  MyChart is used to connect with patients for Virtual Visits (Telemedicine).  Patients are able to view lab/test results, encounter notes, upcoming appointments, etc.  Non-urgent messages can be sent to your provider as well.   To learn more about what you can do with MyChart, go to [email or web address].    Your next appointment:    12 months with Dr. Antoine Poche

## 2023-06-08 NOTE — Addendum Note (Signed)
Addended by: Lindell Spar on: 06/08/2023 08:21 AM   Modules accepted: Orders

## 2023-06-27 DIAGNOSIS — Z23 Encounter for immunization: Secondary | ICD-10-CM | POA: Diagnosis not present

## 2023-06-30 DIAGNOSIS — F01A11 Vascular dementia, mild, with agitation: Secondary | ICD-10-CM | POA: Diagnosis not present

## 2023-06-30 DIAGNOSIS — F339 Major depressive disorder, recurrent, unspecified: Secondary | ICD-10-CM | POA: Diagnosis not present

## 2023-07-04 ENCOUNTER — Other Ambulatory Visit: Payer: Self-pay | Admitting: Family Medicine

## 2023-07-21 ENCOUNTER — Encounter: Payer: Self-pay | Admitting: Family Medicine

## 2023-07-21 ENCOUNTER — Ambulatory Visit (INDEPENDENT_AMBULATORY_CARE_PROVIDER_SITE_OTHER): Payer: Medicare Other | Admitting: Family Medicine

## 2023-07-21 VITALS — BP 134/79 | HR 51 | Wt 128.8 lb

## 2023-07-21 DIAGNOSIS — E7800 Pure hypercholesterolemia, unspecified: Secondary | ICD-10-CM | POA: Diagnosis not present

## 2023-07-21 DIAGNOSIS — G309 Alzheimer's disease, unspecified: Secondary | ICD-10-CM

## 2023-07-21 DIAGNOSIS — F320 Major depressive disorder, single episode, mild: Secondary | ICD-10-CM

## 2023-07-21 DIAGNOSIS — F02A0 Dementia in other diseases classified elsewhere, mild, without behavioral disturbance, psychotic disturbance, mood disturbance, and anxiety: Secondary | ICD-10-CM | POA: Diagnosis not present

## 2023-07-21 DIAGNOSIS — E039 Hypothyroidism, unspecified: Secondary | ICD-10-CM

## 2023-07-21 DIAGNOSIS — Z79631 Long term (current) use of antimetabolite agent: Secondary | ICD-10-CM

## 2023-07-21 DIAGNOSIS — F411 Generalized anxiety disorder: Secondary | ICD-10-CM

## 2023-07-21 DIAGNOSIS — Z79899 Other long term (current) drug therapy: Secondary | ICD-10-CM | POA: Diagnosis not present

## 2023-07-21 LAB — COMPREHENSIVE METABOLIC PANEL
ALT: 18 U/L (ref 0–35)
AST: 22 U/L (ref 0–37)
Albumin: 3.7 g/dL (ref 3.5–5.2)
Alkaline Phosphatase: 37 U/L — ABNORMAL LOW (ref 39–117)
BUN: 16 mg/dL (ref 6–23)
CO2: 30 meq/L (ref 19–32)
Calcium: 8.9 mg/dL (ref 8.4–10.5)
Chloride: 106 meq/L (ref 96–112)
Creatinine, Ser: 0.82 mg/dL (ref 0.40–1.20)
GFR: 67.22 mL/min (ref >60.00)
Glucose, Bld: 92 mg/dL (ref 70–99)
Potassium: 4.8 meq/L (ref 3.5–5.1)
Sodium: 141 meq/L (ref 135–145)
Total Bilirubin: 0.5 mg/dL (ref 0.2–1.2)
Total Protein: 6.2 g/dL (ref 6.0–8.3)

## 2023-07-21 LAB — LIPID PANEL
Cholesterol: 136 mg/dL (ref 0–200)
HDL: 63.8 mg/dL (ref >39.00)
LDL Cholesterol: 56 mg/dL (ref 0–99)
NonHDL: 71.86
Total CHOL/HDL Ratio: 2
Triglycerides: 79 mg/dL (ref 0.0–149.0)
VLDL: 15.8 mg/dL (ref 0.0–40.0)

## 2023-07-21 LAB — TSH: TSH: 0.37 u[IU]/mL (ref 0.35–5.50)

## 2023-07-21 MED ORDER — ESCITALOPRAM OXALATE 10 MG PO TABS: 10.0000 mg | ORAL_TABLET | Freq: Every day | ORAL | 1 refills | Status: AC

## 2023-07-21 MED ORDER — MEMANTINE HCL 5 MG PO TABS: 5.0000 mg | ORAL_TABLET | Freq: Two times a day (BID) | ORAL | Status: AC

## 2023-07-21 NOTE — Progress Notes (Signed)
OFFICE VISIT  07/21/2023  CC:  Chief Complaint  Patient presents with   Medical Management of Chronic Issues    Pt states she still feels dizzy/lightheaded. She feels it more so when she is moving but still feels lightheaded.     Patient is a 82 y.o. female who presents accompanied by her husband Vinnie for 103-month follow-up anxiety, hypothyroidism, osteoporosis, and hyperlipidemia. A/P as of last visit: "#1 GAD. Lexapro 5 mg daily helping well. Will take lorazepam off of her med list--- she has not used this in a long time.   2.  Hypercholesterolemia, goal LDL less than 70. LDL level is 59 one year ago. Lipid panel and lipoprotein a levels drawn today--will forward results to her cardiologist, Dr. Antoine Poche.   #3 osteoporosis. She took alendronate approximately 1 year.  For the last year she has not taken it. Will get her back on this and plan repeat DEXA in 1 year. Continue calcium and vitamin D supplement. Check vitamin D level today.   4.  Hypothyroidism, TSH 1.8 about 3 months ago. Continue 88 mcg Synthroid daily.   #5 breast cancer screening: Next mammogram due 08/2023.   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 This seems to be gradually progressing.  She remains completely functional, though.   #7 chronic tinnitus.  Question of hearing loss. Will ask audiology to see her for further evaluation."  INTERIM HX: She got memory assessment at [facility] on 06/30/2023.  It was felt that some of her short-term memory and mild cognitive impairment issues or secondary to depression.  She was on Lexapro 5 mg daily at that time.  They increased this to 10 mg daily. 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 Namenda was added.  In attempts to review current medications with Coy Saunas and Gillian Scarce there was a bit of uncertainty/confusion. She has a pillbox but no pill bottles with her. We could not be sure that she was taking Lexapro or  Namenda.  Past Medical History:  Diagnosis Date   Angiomyolipoma of left kidney    03/2023 (no f/u imaging needed)   Beta thalassemia, heterozygous    ?alpha? pt does not recall.   Bradycardia    mild sinus   CAD (coronary artery disease) 07/2018   NSTEMI (small vessel occlusion, not amenable to intervention)-->2V CAD on cath, preserved LV systolic fxn.   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  Surgical path NEG for atypica or malignancy.   Memory difficulties 12/25/2020   MGUS (monoclonal gammopathy of unknown significance) 2022   Dr. Myna Hidalgo eval 02/2021-->he feels it is likely reactive d/t her RA.  Low suspicion of plasma cell dyscrasia-->f/u 26mo.   NSTEMI (non-ST elevated myocardial infarction) (HCC) 07/2018   Dr. Antoine Poche   Osteoarthritis of left knee    Osteoporosis 01/2021   01/2021 T scoree -2.7   Palpitations    08/2018 Event monitor normal. Long term event monitor 05/2020 w/out signif arrhythmia--some brief SVT.   Rheumatoid arthritis (HCC) 2007   muscle weaknes at times (Dr. Dierdre Forth)  Tinnitus, bilateral 2021   with high frequ SSHL->reassured by Ent, reassured by [facility] audiol 2024    Past Surgical History:  Procedure Laterality Date   ABDOMINAL HYSTERECTOMY  1987   DUB, no malignancy.  Ovaries still in.   BLADDER REPAIR     Cardiac Event Monitor  08/22/2018   NO arrhythmias 2019 or 2024 (sinus brady)   CHOLECYSTECTOMY  1969   COLONOSCOPY  11/07/2017   2014 adenoma.  Rpt 10/2017 adenoma x 1.  No further colonoscopies needed per GI.    DEXA  01/2021   T score  -2.7   LEFT HEART CATH AND CORONARY ANGIOGRAPHY N/A 08/08/2018   Procedure: LEFT HEART CATH AND CORONARY ANGIOGRAPHY;  Surgeon: Lennette Bihari, MD;  Location: [facility];  Service: Cardiovascular;  Laterality: N/A;   THYROIDECTOMY  2012   cytologic atypia on nodule bx; surgical path NEG for atypica or malignancy.   TRANSTHORACIC ECHOCARDIOGRAM  08/07/2018   2019 EF 60-65%, no wall motion abnl, grd II DD, mod MR.  01/2022 mild AI and MR   UPPER GASTROINTESTINAL ENDOSCOPY     VAGINAL PROLAPSE REPAIR  2012    Outpatient Medications Prior to Visit  Medication Sig Dispense Refill   acetaminophen (TYLENOL) 500 MG tablet Take 500 mg by mouth every 6 (six) hours as needed for headache (pain).     aspirin EC 81 MG tablet Take 81 mg by mouth at bedtime.     Calcium Citrate-Vitamin D (CALCIUM CITRATE + D PO) Take 1 tablet by mouth 2 (two) times daily.      Coenzyme Q10 (CO Q 10) 60 MG CAPS 1 capsule with a meal     fexofenadine (ALLEGRA) 180 MG tablet Take 180 mg by mouth as needed.     folic acid (FOLVITE) 1 MG tablet Take 1 mg by mouth 2 (two) times daily.     ibandronate (BONIVA) 150 MG tablet Take 1 tablet (150 mg total) by mouth every 30 (thirty) days. Take in the morning with a full glass of water, on an empty stomach, and do not take anything else by mouth or lie down for the next 30 min. 3 tablet 4   methotrexate 2.5 MG tablet Take 12.5 mg by mouth every Sunday.      Multiple Vitamin (MULTIVITAMIN WITH MINERALS) TABS tablet Take 1 tablet by mouth daily.     nitroGLYCERIN (NITROSTAT) 0.4 MG SL tablet Place 1 tablet (0.4 mg total) under the tongue every 5 (five) minutes x 3 doses as needed for chest pain. 25 tablet 3   potassium chloride (KLOR-CON) 8 MEQ tablet Take 2 tablets (16 mEq total) by mouth daily. 180 tablet 3   rosuvastatin (CRESTOR) 40 MG tablet TAKE 1 TABLET BY MOUTH DAILY AT 6 PM. 90 tablet 3   SYNTHROID 88 MCG tablet TAKE 1 TABLET BY MOUTH EVERY DAY BEFORE BREAKFAST 90  tablet 1   isosorbide mononitrate (IMDUR) 60 MG 24 hr tablet TAKE 1 TABLET BY MOUTH EVERY DAY (Patient not taking: Reported on 07/21/2023) 90 tablet 2   diphenhydrAMINE (BENADRYL) 25 mg capsule 1 capsule as needed (Patient not taking: Reported on 05/12/2023)     No facility-administered medications prior to visit.    Allergies  Allergen Reactions   Bacitracin-Polymyxin B     Other reaction(s): Unknown Other reaction(s): Other (See Comments) unknown   Donepezil     Vivid dreams Other reaction(s): Other (See Comments) Vivid Dreams   Neosporin [Neomycin-Bacitracin Zn-Polymyx] Other (See Comments)  Patient states that wounds or scratches never heal. (pt uses bacitracin)    Niacin     Other reaction(s): Myal;gia Other reaction(s): Myalgias (intolerance)   Other     Other reaction(s): Unknown   Seasonal Ic [Cholestatin] Other (See Comments)    Leg cramps   Triamcinolone Other (See Comments)    Burning sensation   Triamcinolone Acetonide     Other reaction(s): Unknown   Atorvastatin Other (See Comments)    Leg cramps Other reaction(s): Myalgia   Monascus Purpureus Went Yeast Other (See Comments)    "Red Yeast Rice" causes leg cramps Other reaction(s): Myalgia   Pitavastatin     Other reaction(s): Myalgia Other reaction(s): Cramps (ALLERGY/intolerance), Myalgias (intolerance)   Pravastatin Other (See Comments)    Leg cramps    Review of Systems As per HPI  PE:    07/21/2023    9:40 AM 06/08/2023    7:40 AM 05/12/2023   11:11 AM  Vitals with BMI  Height  5\' 7"  5\' 7"   Weight 128 lbs 13 oz 124 lbs 13 oz 125 lbs 3 oz  BMI  19.54 19.6  Systolic 134 104 098  Diastolic 79 70 67  Pulse 51 65 56     Physical Exam  Gen: Alert, well appearing.  Patient is oriented to person, place, time, and situation. AFFECT: pleasant, lucid thought and speech. CV: RRR, no m/r/g.   LUNGS: CTA bilat, nonlabored resps, good aeration in all lung fields. EXT: no clubbing or cyanosis.  no  edema.    LABS:  Last CBC Lab Results  Component Value Date   WBC 5.5 04/06/2023   HGB 11.2 (L) 04/06/2023   HCT 33.8 (L) 04/06/2023   MCV 94.7 04/06/2023   MCH 31.4 04/06/2023   RDW 14.3 04/06/2023   PLT 290 04/06/2023   Lab Results  Component Value Date   IRON 67 06/14/2012   Last metabolic panel Lab Results  Component Value Date   GLUCOSE 95 04/06/2023   NA 139 04/06/2023   K 4.0 04/06/2023   CL 110 04/06/2023   CO2 21 (L) 04/06/2023   BUN 11 04/06/2023   CREATININE 0.69 04/06/2023   GFRNONAA >60 04/06/2023   CALCIUM 8.5 (L) 04/06/2023   PROT 6.4 (L) 04/06/2023   ALBUMIN 3.3 (L) 04/06/2023   LABGLOB 3.1 08/27/2021   AGRATIO 1.2 08/27/2021   BILITOT 0.7 04/06/2023   ALKPHOS 37 (L) 04/06/2023   AST 20 04/06/2023   ALT 12 04/06/2023   ANIONGAP 8 04/06/2023   Last lipids Lab Results  Component Value Date   CHOL 144 01/18/2023   HDL 69.50 01/18/2023   LDLCALC 63 01/18/2023   LDLDIRECT 155.5 12/04/2012   TRIG 54.0 01/18/2023   CHOLHDL 2 01/18/2023   Last hemoglobin A1c Lab Results  Component Value Date   HGBA1C 5.7 (H) 08/07/2018   Last thyroid functions Lab Results  Component Value Date   TSH 1.810 11/01/2022   Last vitamin D Lab Results  Component Value Date   VD25OH 62.80 01/18/2023   Last vitamin B12 and Folate Lab Results  Component Value Date   VITAMINB12 527 12/25/2020   FOLATE 12.7 06/28/2007   IMPRESSION AND PLAN:  #1 GAD, major depressive disorder.. She had slight improvement on Lexapro 5 mg. Memory clinic recently increased her dose to 10 mg daily but it is not clear that she is doing this. She will go home and check pill bottles.   2.  Hypercholesterolemia, doing well on rosuvastatin 40  mg a day. Goal LDL less than 70. LDL level is 63 approximately 6 months ago ago. Check lipid panel and hepatic panel today and we will forward results to Dr. Antoine Poche to make him aware.   #3 osteoporosis. She took alendronate approximately 1  year.  She then did not take the medication for about a year. We got her on Boniva about 6 months ago. Next DEXA planned in about 6 months. Continue calcium and vitamin D supplement.   4.  Hypothyroidism, TSH 1.8 about 9 months ago. Continue 88 mcg Synthroid daily. TSH today.  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 [facility] (Dr. Herminio Commons) prescribed Namenda about 3 weeks ago. Patient to go home and review pill bottles to see if she has started this as directed.  #6 Breast cancer screening: Next mammogram due 08/2023  She and/or her husband Gillian Scarce will call us back with some medication clarifications.  An After Visit Summary was printed and given to the patient.  FOLLOW UP: Return in about 4 weeks (around 08/18/2023) for f/u mood/memory/polypharm.  Signed:  Santiago Bumpers, MD           07/21/2023

## 2023-08-17 NOTE — Progress Notes (Unsigned)
OFFICE VISIT  08/18/2023  CC:  Chief Complaint  Patient presents with   Anxiety    Patient is a 82 y.o. female who presents for 1 mo f/u anx/dep, memory impairment, and polypharmacy. A/P as of last visit: "#1 GAD, major depressive disorder.. She had slight improvement on Lexapro 5 mg. Memory clinic recently increased her dose to 10 mg daily but it is not clear that she is doing this. She will go home and check pill bottles.   2.  Hypercholesterolemia, doing well on rosuvastatin 40 mg a day. Goal LDL less than 70. LDL level is 63 approximately 6 months ago ago. Check lipid panel and hepatic panel today and we will forward results to Dr. Antoine Poche to make him aware.   #3 osteoporosis. She took alendronate approximately 1 year.  She then did not take the medication for about a year. We got her on Boniva about 6 months ago. Next DEXA planned in about 6 months. Continue calcium and vitamin D supplement.    4.  Hypothyroidism, TSH 1.8 about 9 months ago. Continue 88 mcg Synthroid daily. TSH today.   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 [facility] (Dr. Herminio Commons) prescribed Namenda about 3 weeks ago. Patient to go home and review pill bottles to see if she has started this as directed.   #6 Breast cancer screening: Next mammogram due 08/2023   She and/or her husband Gillian Scarce will call us back with some medication clarifications."  INTERIM HX: All labs normal last visit. Feeling well. No problems or confusion with medications.  She and her husband Gillian Scarce will be moving to [place] to be near their kids.  They will be living in a retirement [place]. This will be within the next 6 weeks or so.  Past Medical History:  Diagnosis Date   Angiomyolipoma of left kidney    03/2023 (no f/u imaging needed)   Beta thalassemia, heterozygous    ?alpha? pt does not recall.   Bradycardia    mild sinus   CAD (coronary artery disease) 07/2018   NSTEMI  (small vessel occlusion, not amenable to intervention)-->2V CAD on cath, preserved LV systolic fxn.   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  Surgical path NEG for atypica or malignancy.   Memory difficulties 12/25/2020   MGUS (monoclonal gammopathy of unknown significance) 2022   Dr. Myna Hidalgo eval 02/2021-->he feels it is likely reactive d/t her RA.  Low suspicion of plasma cell dyscrasia-->f/u 46mo.   NSTEMI (non-ST elevated myocardial infarction) (HCC) 07/2018   Dr. Antoine Poche   Osteoarthritis of left knee    Osteoporosis 01/2021   01/2021 T scoree -2.7   Palpitations    08/2018 Event monitor normal. Long term event monitor 05/2020 w/out signif arrhythmia--some brief SVT.   Rheumatoid arthritis (HCC) 2007   muscle weaknes at times (Dr. Dierdre Forth)   Tinnitus, bilateral 2021   with high frequ SSHL->reassured by Ent, reassured by [facility] audiol 2024    Past Surgical History:  Procedure Laterality Date   ABDOMINAL HYSTERECTOMY  1987   DUB, no  malignancy.  Ovaries still in.   BLADDER REPAIR     Cardiac Event Monitor  08/22/2018   NO arrhythmias 2019 or 2024 (sinus brady)   CHOLECYSTECTOMY  1969   COLONOSCOPY  11/07/2017   2014 adenoma.  Rpt 10/2017 adenoma x 1.  No further colonoscopies needed per GI.    DEXA  01/2021   T score -2.7   LEFT HEART CATH AND CORONARY ANGIOGRAPHY N/A 08/08/2018   Procedure: LEFT HEART CATH AND CORONARY ANGIOGRAPHY;  Surgeon: Lennette Bihari, MD;  Location: [facility];  Service: Cardiovascular;  Laterality: N/A;   THYROIDECTOMY  2012   cytologic atypia  on nodule bx; surgical path NEG for atypica or malignancy.   TRANSTHORACIC ECHOCARDIOGRAM  08/07/2018   2019 EF 60-65%, no wall motion abnl, grd II DD, mod MR.  01/2022 mild AI and MR   UPPER GASTROINTESTINAL ENDOSCOPY     VAGINAL PROLAPSE REPAIR  2012    Outpatient Medications Prior to Visit  Medication Sig Dispense Refill   acetaminophen (TYLENOL) 500 MG tablet Take 500 mg by mouth every 6 (six) hours as needed for headache (pain).     aspirin EC 81 MG tablet Take 81 mg by mouth at bedtime.     Calcium Citrate-Vitamin D (CALCIUM CITRATE + D PO) Take 1 tablet by mouth 2 (two) times daily.      Coenzyme Q10 (CO Q 10) 60 MG CAPS 1 capsule with a meal     escitalopram (LEXAPRO) 10 MG tablet Take 1 tablet (10 mg total) by mouth daily. 90 tablet 1   fexofenadine (ALLEGRA) 180 MG tablet Take 180 mg by mouth as needed.     folic acid (FOLVITE) 1 MG tablet Take 1 mg by mouth 2 (two) times daily.     ibandronate (BONIVA) 150 MG tablet Take 1 tablet (150 mg total) by mouth every 30 (thirty) days. Take in the morning with a full glass of water, on an empty stomach, and do not take anything else by mouth or lie down for the next 30 min. 3 tablet 4   memantine (NAMENDA) 5 MG tablet Take 1 tablet (5 mg total) by mouth 2 (two) times daily.     methotrexate 2.5 MG tablet Take 12.5 mg by mouth every Sunday.      Multiple Vitamin (MULTIVITAMIN WITH MINERALS) TABS tablet Take 1 tablet by mouth daily.     nitroGLYCERIN (NITROSTAT) 0.4 MG SL tablet Place 1 tablet (0.4 mg total) under the tongue every 5 (five) minutes x 3 doses as needed for chest pain. 25 tablet 3   potassium chloride (KLOR-CON) 8 MEQ tablet Take 2 tablets (16 mEq total) by mouth daily. 180 tablet 3   rosuvastatin (CRESTOR) 40 MG tablet TAKE 1 TABLET BY MOUTH DAILY AT 6 PM. 90 tablet 3   SYNTHROID 88 MCG tablet TAKE 1 TABLET BY MOUTH EVERY DAY BEFORE BREAKFAST 90 tablet 1   isosorbide mononitrate (IMDUR) 60 MG 24 hr tablet TAKE 1 TABLET BY MOUTH  EVERY DAY (Patient not taking: Reported on 07/21/2023) 90 tablet 2   No facility-administered medications prior to visit.    Allergies  Allergen Reactions   Bacitracin-Polymyxin B     Other reaction(s): Unknown Other reaction(s): Other (See Comments) unknown   Donepezil     Vivid dreams Other reaction(s): Other (See Comments) Vivid Dreams   Neosporin [Neomycin-Bacitracin Zn-Polymyx] Other (See Comments)    Patient states that wounds or scratches never heal. 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 Affect is pleasant.  Thought and speech are lucid. No further exam today  LABS:  Last CBC Lab Results  Component Value Date   WBC 5.5 04/06/2023   HGB 11.2 (L) 04/06/2023   HCT 33.8 (L) 04/06/2023   MCV 94.7 04/06/2023   MCH 31.4 04/06/2023   RDW 14.3 04/06/2023   PLT 290 04/06/2023   Last metabolic panel Lab Results  Component Value Date   GLUCOSE 92 07/21/2023   NA 141 07/21/2023   K 4.8 07/21/2023   CL 106  07/21/2023   CO2 30 07/21/2023   BUN 16 07/21/2023   CREATININE 0.82 07/21/2023   GFR 67.22 07/21/2023   CALCIUM 8.9 07/21/2023   PROT 6.2 07/21/2023   ALBUMIN 3.7 07/21/2023   LABGLOB 3.1 08/27/2021   AGRATIO 1.2 08/27/2021   BILITOT 0.5 07/21/2023   ALKPHOS 37 (L) 07/21/2023   AST 22 07/21/2023   ALT 18 07/21/2023   ANIONGAP 8 04/06/2023   Last lipids Lab Results  Component Value Date   CHOL 136 07/21/2023   HDL 63.80 07/21/2023   LDLCALC 56 07/21/2023   LDLDIRECT 155.5 12/04/2012   TRIG 79.0 07/21/2023   CHOLHDL 2 07/21/2023   Last hemoglobin A1c Lab Results  Component Value Date   HGBA1C 5.7 (H) 08/07/2018   Last thyroid functions Lab Results  Component Value Date   TSH 0.37 07/21/2023   Last vitamin D Lab Results  Component Value Date   VD25OH 62.80 01/18/2023   Last vitamin B12 and Folate Lab Results  Component Value Date   VITAMINB12 527 12/25/2020   FOLATE 12.7 06/28/2007   IMPRESSION AND PLAN:  GAD, doing well on 10 mg Lexapro daily.  They will be moving to [place] within the next 6 weeks.  I wish them well.  They are great people. I told them I will be happy to refill her medications with 90-day supplies prior to leaving if needed.  Also if needed I will do a refill of their medications x 1 in [place] if they have a little delay in finding a new PCP there.  An After Visit Summary was printed and given to the patient.  FOLLOW UP: No follow-ups on file.  Signed:  Santiago Bumpers, MD           08/18/2023

## 2023-08-18 ENCOUNTER — Encounter: Payer: Self-pay | Admitting: Family Medicine

## 2023-08-18 ENCOUNTER — Ambulatory Visit (INDEPENDENT_AMBULATORY_CARE_PROVIDER_SITE_OTHER): Payer: Medicare Other | Admitting: Family Medicine

## 2023-08-18 VITALS — BP 123/78 | HR 60 | Wt 128.0 lb

## 2023-08-18 DIAGNOSIS — M810 Age-related osteoporosis without current pathological fracture: Secondary | ICD-10-CM

## 2023-08-18 DIAGNOSIS — F411 Generalized anxiety disorder: Secondary | ICD-10-CM | POA: Diagnosis not present

## 2023-08-18 DIAGNOSIS — Z1231 Encounter for screening mammogram for malignant neoplasm of breast: Secondary | ICD-10-CM

## 2023-08-20 ENCOUNTER — Telehealth: Payer: Self-pay | Admitting: Cardiology

## 2023-08-20 ENCOUNTER — Other Ambulatory Visit: Payer: Self-pay | Admitting: Family Medicine

## 2023-08-24 MED ORDER — LEVOTHYROXINE SODIUM 88 MCG PO TABS: 88.0000 ug | ORAL_TABLET | Freq: Every day | ORAL | 1 refills | Status: AC

## 2023-08-24 NOTE — Telephone Encounter (Signed)
Patient calling to check status of refill of med. Sent request on 12/7

## 2023-08-24 NOTE — Addendum Note (Signed)
Addended by: Filomena Jungling on: 08/24/2023 10:23 AM   Modules accepted: Orders

## 2023-09-20 DIAGNOSIS — Z6820 Body mass index (BMI) 20.0-20.9, adult: Secondary | ICD-10-CM | POA: Diagnosis not present

## 2023-09-20 DIAGNOSIS — M791 Myalgia, unspecified site: Secondary | ICD-10-CM | POA: Diagnosis not present

## 2023-09-20 DIAGNOSIS — Z79899 Other long term (current) drug therapy: Secondary | ICD-10-CM | POA: Diagnosis not present

## 2023-09-20 DIAGNOSIS — M0589 Other rheumatoid arthritis with rheumatoid factor of multiple sites: Secondary | ICD-10-CM | POA: Diagnosis not present

## 2023-10-05 ENCOUNTER — Ambulatory Visit: Payer: Medicare Other

## 2023-11-07 DIAGNOSIS — M810 Age-related osteoporosis without current pathological fracture: Secondary | ICD-10-CM | POA: Diagnosis not present

## 2023-11-07 DIAGNOSIS — M069 Rheumatoid arthritis, unspecified: Secondary | ICD-10-CM | POA: Diagnosis not present

## 2023-11-07 DIAGNOSIS — Z13220 Encounter for screening for lipoid disorders: Secondary | ICD-10-CM | POA: Diagnosis not present

## 2023-11-07 DIAGNOSIS — Z1231 Encounter for screening mammogram for malignant neoplasm of breast: Secondary | ICD-10-CM | POA: Diagnosis not present

## 2023-11-07 DIAGNOSIS — I951 Orthostatic hypotension: Secondary | ICD-10-CM | POA: Diagnosis not present

## 2023-11-07 DIAGNOSIS — E039 Hypothyroidism, unspecified: Secondary | ICD-10-CM | POA: Diagnosis not present

## 2023-11-07 DIAGNOSIS — I2510 Atherosclerotic heart disease of native coronary artery without angina pectoris: Secondary | ICD-10-CM | POA: Diagnosis not present

## 2023-11-07 DIAGNOSIS — F419 Anxiety disorder, unspecified: Secondary | ICD-10-CM | POA: Diagnosis not present

## 2023-11-07 DIAGNOSIS — G3184 Mild cognitive impairment, so stated: Secondary | ICD-10-CM | POA: Diagnosis not present

## 2023-11-07 DIAGNOSIS — Z79899 Other long term (current) drug therapy: Secondary | ICD-10-CM | POA: Diagnosis not present

## 2023-11-07 DIAGNOSIS — Z1329 Encounter for screening for other suspected endocrine disorder: Secondary | ICD-10-CM | POA: Diagnosis not present

## 2023-11-07 DIAGNOSIS — E782 Mixed hyperlipidemia: Secondary | ICD-10-CM | POA: Diagnosis not present

## 2023-11-07 DIAGNOSIS — Z2911 Encounter for prophylactic immunotherapy for respiratory syncytial virus (RSV): Secondary | ICD-10-CM | POA: Diagnosis not present

## 2023-11-11 DIAGNOSIS — R6881 Early satiety: Secondary | ICD-10-CM | POA: Diagnosis not present

## 2023-11-11 DIAGNOSIS — I2510 Atherosclerotic heart disease of native coronary artery without angina pectoris: Secondary | ICD-10-CM | POA: Diagnosis not present

## 2023-11-11 DIAGNOSIS — R634 Abnormal weight loss: Secondary | ICD-10-CM | POA: Diagnosis not present

## 2023-11-11 DIAGNOSIS — R1013 Epigastric pain: Secondary | ICD-10-CM | POA: Diagnosis not present

## 2023-11-16 DIAGNOSIS — M810 Age-related osteoporosis without current pathological fracture: Secondary | ICD-10-CM | POA: Diagnosis not present

## 2023-11-16 DIAGNOSIS — E559 Vitamin D deficiency, unspecified: Secondary | ICD-10-CM | POA: Diagnosis not present

## 2023-11-16 DIAGNOSIS — M79671 Pain in right foot: Secondary | ICD-10-CM | POA: Diagnosis not present

## 2023-11-16 DIAGNOSIS — R779 Abnormality of plasma protein, unspecified: Secondary | ICD-10-CM | POA: Diagnosis not present

## 2023-11-16 DIAGNOSIS — R768 Other specified abnormal immunological findings in serum: Secondary | ICD-10-CM | POA: Diagnosis not present

## 2023-11-16 DIAGNOSIS — M79641 Pain in right hand: Secondary | ICD-10-CM | POA: Diagnosis not present

## 2023-11-16 DIAGNOSIS — M79642 Pain in left hand: Secondary | ICD-10-CM | POA: Diagnosis not present

## 2023-11-16 DIAGNOSIS — M329 Systemic lupus erythematosus, unspecified: Secondary | ICD-10-CM | POA: Diagnosis not present

## 2023-11-16 DIAGNOSIS — M0579 Rheumatoid arthritis with rheumatoid factor of multiple sites without organ or systems involvement: Secondary | ICD-10-CM | POA: Diagnosis not present

## 2023-11-16 DIAGNOSIS — E213 Hyperparathyroidism, unspecified: Secondary | ICD-10-CM | POA: Diagnosis not present

## 2023-11-16 DIAGNOSIS — M3219 Other organ or system involvement in systemic lupus erythematosus: Secondary | ICD-10-CM | POA: Diagnosis not present

## 2023-11-16 DIAGNOSIS — M069 Rheumatoid arthritis, unspecified: Secondary | ICD-10-CM | POA: Diagnosis not present

## 2023-11-16 DIAGNOSIS — M79672 Pain in left foot: Secondary | ICD-10-CM | POA: Diagnosis not present

## 2023-11-17 DIAGNOSIS — R779 Abnormality of plasma protein, unspecified: Secondary | ICD-10-CM | POA: Diagnosis not present

## 2023-11-17 DIAGNOSIS — E213 Hyperparathyroidism, unspecified: Secondary | ICD-10-CM | POA: Diagnosis not present

## 2023-11-23 DIAGNOSIS — I2510 Atherosclerotic heart disease of native coronary artery without angina pectoris: Secondary | ICD-10-CM | POA: Diagnosis not present

## 2023-11-23 DIAGNOSIS — E782 Mixed hyperlipidemia: Secondary | ICD-10-CM | POA: Diagnosis not present

## 2023-11-23 DIAGNOSIS — Z79899 Other long term (current) drug therapy: Secondary | ICD-10-CM | POA: Diagnosis not present

## 2023-11-23 DIAGNOSIS — E039 Hypothyroidism, unspecified: Secondary | ICD-10-CM | POA: Diagnosis not present

## 2023-11-23 DIAGNOSIS — D7589 Other specified diseases of blood and blood-forming organs: Secondary | ICD-10-CM | POA: Diagnosis not present

## 2023-11-28 DIAGNOSIS — I351 Nonrheumatic aortic (valve) insufficiency: Secondary | ICD-10-CM | POA: Diagnosis not present

## 2023-11-28 DIAGNOSIS — I340 Nonrheumatic mitral (valve) insufficiency: Secondary | ICD-10-CM | POA: Diagnosis not present

## 2023-11-28 DIAGNOSIS — I2583 Coronary atherosclerosis due to lipid rich plaque: Secondary | ICD-10-CM | POA: Diagnosis not present

## 2023-11-28 DIAGNOSIS — I951 Orthostatic hypotension: Secondary | ICD-10-CM | POA: Diagnosis not present

## 2023-11-28 DIAGNOSIS — E782 Mixed hyperlipidemia: Secondary | ICD-10-CM | POA: Diagnosis not present

## 2023-11-28 DIAGNOSIS — I2510 Atherosclerotic heart disease of native coronary artery without angina pectoris: Secondary | ICD-10-CM | POA: Diagnosis not present

## 2023-11-29 DIAGNOSIS — L989 Disorder of the skin and subcutaneous tissue, unspecified: Secondary | ICD-10-CM | POA: Diagnosis not present

## 2023-11-30 DIAGNOSIS — E213 Hyperparathyroidism, unspecified: Secondary | ICD-10-CM | POA: Diagnosis not present

## 2023-11-30 DIAGNOSIS — R634 Abnormal weight loss: Secondary | ICD-10-CM | POA: Diagnosis not present

## 2023-12-02 DIAGNOSIS — M25562 Pain in left knee: Secondary | ICD-10-CM | POA: Diagnosis not present

## 2023-12-02 DIAGNOSIS — M1712 Unilateral primary osteoarthritis, left knee: Secondary | ICD-10-CM | POA: Diagnosis not present

## 2023-12-02 DIAGNOSIS — G8929 Other chronic pain: Secondary | ICD-10-CM | POA: Diagnosis not present

## 2023-12-05 DIAGNOSIS — F0390 Unspecified dementia without behavioral disturbance: Secondary | ICD-10-CM | POA: Diagnosis not present

## 2023-12-05 DIAGNOSIS — R630 Anorexia: Secondary | ICD-10-CM | POA: Diagnosis not present

## 2023-12-05 DIAGNOSIS — K3189 Other diseases of stomach and duodenum: Secondary | ICD-10-CM | POA: Diagnosis not present

## 2023-12-05 DIAGNOSIS — Z79890 Hormone replacement therapy (postmenopausal): Secondary | ICD-10-CM | POA: Diagnosis not present

## 2023-12-05 DIAGNOSIS — K449 Diaphragmatic hernia without obstruction or gangrene: Secondary | ICD-10-CM | POA: Diagnosis not present

## 2023-12-05 DIAGNOSIS — I10 Essential (primary) hypertension: Secondary | ICD-10-CM | POA: Diagnosis not present

## 2023-12-05 DIAGNOSIS — Z79631 Long term (current) use of antimetabolite agent: Secondary | ICD-10-CM | POA: Diagnosis not present

## 2023-12-05 DIAGNOSIS — E785 Hyperlipidemia, unspecified: Secondary | ICD-10-CM | POA: Diagnosis not present

## 2023-12-05 DIAGNOSIS — I2510 Atherosclerotic heart disease of native coronary artery without angina pectoris: Secondary | ICD-10-CM | POA: Diagnosis not present

## 2023-12-05 DIAGNOSIS — K2289 Other specified disease of esophagus: Secondary | ICD-10-CM | POA: Diagnosis not present

## 2023-12-05 DIAGNOSIS — I252 Old myocardial infarction: Secondary | ICD-10-CM | POA: Diagnosis not present

## 2023-12-05 DIAGNOSIS — R634 Abnormal weight loss: Secondary | ICD-10-CM | POA: Diagnosis not present

## 2023-12-05 DIAGNOSIS — Z7982 Long term (current) use of aspirin: Secondary | ICD-10-CM | POA: Diagnosis not present

## 2023-12-05 DIAGNOSIS — Z79899 Other long term (current) drug therapy: Secondary | ICD-10-CM | POA: Diagnosis not present

## 2023-12-05 DIAGNOSIS — K2950 Unspecified chronic gastritis without bleeding: Secondary | ICD-10-CM | POA: Diagnosis not present

## 2023-12-05 DIAGNOSIS — E7849 Other hyperlipidemia: Secondary | ICD-10-CM | POA: Diagnosis not present

## 2023-12-05 DIAGNOSIS — E890 Postprocedural hypothyroidism: Secondary | ICD-10-CM | POA: Diagnosis not present

## 2023-12-05 DIAGNOSIS — M069 Rheumatoid arthritis, unspecified: Secondary | ICD-10-CM | POA: Diagnosis not present

## 2023-12-05 DIAGNOSIS — Z681 Body mass index (BMI) 19 or less, adult: Secondary | ICD-10-CM | POA: Diagnosis not present

## 2023-12-05 DIAGNOSIS — K209 Esophagitis, unspecified without bleeding: Secondary | ICD-10-CM | POA: Diagnosis not present

## 2023-12-05 DIAGNOSIS — R6881 Early satiety: Secondary | ICD-10-CM | POA: Diagnosis not present

## 2023-12-07 DIAGNOSIS — R636 Underweight: Secondary | ICD-10-CM | POA: Diagnosis not present

## 2023-12-07 DIAGNOSIS — R413 Other amnesia: Secondary | ICD-10-CM | POA: Diagnosis not present

## 2023-12-08 DIAGNOSIS — R432 Parageusia: Secondary | ICD-10-CM | POA: Diagnosis not present

## 2023-12-08 DIAGNOSIS — R634 Abnormal weight loss: Secondary | ICD-10-CM | POA: Diagnosis not present

## 2023-12-08 DIAGNOSIS — I2510 Atherosclerotic heart disease of native coronary artery without angina pectoris: Secondary | ICD-10-CM | POA: Diagnosis not present

## 2023-12-09 DIAGNOSIS — R413 Other amnesia: Secondary | ICD-10-CM | POA: Diagnosis not present

## 2023-12-23 DIAGNOSIS — R636 Underweight: Secondary | ICD-10-CM | POA: Diagnosis not present

## 2023-12-23 DIAGNOSIS — R413 Other amnesia: Secondary | ICD-10-CM | POA: Diagnosis not present

## 2024-01-03 DIAGNOSIS — Z87891 Personal history of nicotine dependence: Secondary | ICD-10-CM | POA: Diagnosis not present

## 2024-01-03 DIAGNOSIS — S6992XA Unspecified injury of left wrist, hand and finger(s), initial encounter: Secondary | ICD-10-CM | POA: Diagnosis not present

## 2024-01-03 DIAGNOSIS — M19032 Primary osteoarthritis, left wrist: Secondary | ICD-10-CM | POA: Diagnosis not present

## 2024-01-03 DIAGNOSIS — I2510 Atherosclerotic heart disease of native coronary artery without angina pectoris: Secondary | ICD-10-CM | POA: Diagnosis not present

## 2024-01-03 DIAGNOSIS — I10 Essential (primary) hypertension: Secondary | ICD-10-CM | POA: Diagnosis not present

## 2024-01-10 DIAGNOSIS — M810 Age-related osteoporosis without current pathological fracture: Secondary | ICD-10-CM | POA: Diagnosis not present

## 2024-01-10 DIAGNOSIS — Z79899 Other long term (current) drug therapy: Secondary | ICD-10-CM | POA: Diagnosis not present

## 2024-01-12 DIAGNOSIS — M810 Age-related osteoporosis without current pathological fracture: Secondary | ICD-10-CM | POA: Diagnosis not present

## 2024-01-25 DIAGNOSIS — E559 Vitamin D deficiency, unspecified: Secondary | ICD-10-CM | POA: Diagnosis not present

## 2024-01-25 DIAGNOSIS — E210 Primary hyperparathyroidism: Secondary | ICD-10-CM | POA: Diagnosis not present

## 2024-01-25 DIAGNOSIS — E213 Hyperparathyroidism, unspecified: Secondary | ICD-10-CM | POA: Diagnosis not present

## 2024-01-25 DIAGNOSIS — E039 Hypothyroidism, unspecified: Secondary | ICD-10-CM | POA: Diagnosis not present

## 2024-01-25 DIAGNOSIS — E612 Magnesium deficiency: Secondary | ICD-10-CM | POA: Diagnosis not present

## 2024-02-02 DIAGNOSIS — R636 Underweight: Secondary | ICD-10-CM | POA: Diagnosis not present

## 2024-02-02 DIAGNOSIS — R413 Other amnesia: Secondary | ICD-10-CM | POA: Diagnosis not present

## 2024-02-13 DIAGNOSIS — M810 Age-related osteoporosis without current pathological fracture: Secondary | ICD-10-CM | POA: Diagnosis not present

## 2024-02-13 DIAGNOSIS — R634 Abnormal weight loss: Secondary | ICD-10-CM | POA: Diagnosis not present

## 2024-03-22 DIAGNOSIS — R413 Other amnesia: Secondary | ICD-10-CM | POA: Diagnosis not present

## 2024-03-22 DIAGNOSIS — R636 Underweight: Secondary | ICD-10-CM | POA: Diagnosis not present

## 2024-05-09 DIAGNOSIS — M810 Age-related osteoporosis without current pathological fracture: Secondary | ICD-10-CM | POA: Diagnosis not present

## 2024-05-09 DIAGNOSIS — M79671 Pain in right foot: Secondary | ICD-10-CM | POA: Diagnosis not present

## 2024-05-09 DIAGNOSIS — E782 Mixed hyperlipidemia: Secondary | ICD-10-CM | POA: Diagnosis not present

## 2024-05-09 DIAGNOSIS — I351 Nonrheumatic aortic (valve) insufficiency: Secondary | ICD-10-CM | POA: Diagnosis not present

## 2024-05-09 DIAGNOSIS — I340 Nonrheumatic mitral (valve) insufficiency: Secondary | ICD-10-CM | POA: Diagnosis not present

## 2024-05-09 DIAGNOSIS — Z111 Encounter for screening for respiratory tuberculosis: Secondary | ICD-10-CM | POA: Diagnosis not present

## 2024-05-09 DIAGNOSIS — I252 Old myocardial infarction: Secondary | ICD-10-CM | POA: Diagnosis not present

## 2024-05-09 DIAGNOSIS — I2510 Atherosclerotic heart disease of native coronary artery without angina pectoris: Secondary | ICD-10-CM | POA: Diagnosis not present

## 2024-05-09 DIAGNOSIS — N1831 Chronic kidney disease, stage 3a: Secondary | ICD-10-CM | POA: Diagnosis not present

## 2024-05-09 DIAGNOSIS — F0390 Unspecified dementia without behavioral disturbance: Secondary | ICD-10-CM | POA: Diagnosis not present

## 2024-05-09 DIAGNOSIS — M069 Rheumatoid arthritis, unspecified: Secondary | ICD-10-CM | POA: Diagnosis not present

## 2024-05-09 DIAGNOSIS — Z7689 Persons encountering health services in other specified circumstances: Secondary | ICD-10-CM | POA: Diagnosis not present

## 2024-05-10 DIAGNOSIS — I2510 Atherosclerotic heart disease of native coronary artery without angina pectoris: Secondary | ICD-10-CM | POA: Diagnosis not present

## 2024-05-10 DIAGNOSIS — Z111 Encounter for screening for respiratory tuberculosis: Secondary | ICD-10-CM | POA: Diagnosis not present

## 2024-05-10 DIAGNOSIS — F0390 Unspecified dementia without behavioral disturbance: Secondary | ICD-10-CM | POA: Diagnosis not present

## 2024-05-10 DIAGNOSIS — I2583 Coronary atherosclerosis due to lipid rich plaque: Secondary | ICD-10-CM | POA: Diagnosis not present

## 2024-05-10 DIAGNOSIS — R351 Nocturia: Secondary | ICD-10-CM | POA: Diagnosis not present

## 2024-05-10 DIAGNOSIS — M810 Age-related osteoporosis without current pathological fracture: Secondary | ICD-10-CM | POA: Diagnosis not present

## 2024-05-10 DIAGNOSIS — E782 Mixed hyperlipidemia: Secondary | ICD-10-CM | POA: Diagnosis not present

## 2024-05-10 DIAGNOSIS — E039 Hypothyroidism, unspecified: Secondary | ICD-10-CM | POA: Diagnosis not present

## 2024-05-26 ENCOUNTER — Other Ambulatory Visit: Payer: Self-pay | Admitting: Family Medicine

## 2024-05-29 DIAGNOSIS — R636 Underweight: Secondary | ICD-10-CM | POA: Diagnosis not present

## 2024-05-29 DIAGNOSIS — R413 Other amnesia: Secondary | ICD-10-CM | POA: Diagnosis not present

## 2024-06-06 DIAGNOSIS — M069 Rheumatoid arthritis, unspecified: Secondary | ICD-10-CM | POA: Diagnosis not present

## 2024-06-06 DIAGNOSIS — Z23 Encounter for immunization: Secondary | ICD-10-CM | POA: Diagnosis not present

## 2024-06-06 DIAGNOSIS — E890 Postprocedural hypothyroidism: Secondary | ICD-10-CM | POA: Diagnosis not present

## 2024-06-06 DIAGNOSIS — F0390 Unspecified dementia without behavioral disturbance: Secondary | ICD-10-CM | POA: Diagnosis not present

## 2024-06-06 DIAGNOSIS — M810 Age-related osteoporosis without current pathological fracture: Secondary | ICD-10-CM | POA: Diagnosis not present

## 2024-06-26 ENCOUNTER — Other Ambulatory Visit: Payer: Self-pay | Admitting: Cardiology

## 2024-08-14 DIAGNOSIS — E213 Hyperparathyroidism, unspecified: Secondary | ICD-10-CM | POA: Diagnosis not present

## 2024-08-14 DIAGNOSIS — M810 Age-related osteoporosis without current pathological fracture: Secondary | ICD-10-CM | POA: Diagnosis not present

## 2024-08-14 DIAGNOSIS — E559 Vitamin D deficiency, unspecified: Secondary | ICD-10-CM | POA: Diagnosis not present

## 2024-08-14 DIAGNOSIS — R634 Abnormal weight loss: Secondary | ICD-10-CM | POA: Diagnosis not present

## 2024-08-28 DIAGNOSIS — N390 Urinary tract infection, site not specified: Secondary | ICD-10-CM | POA: Diagnosis not present

## 2024-08-28 DIAGNOSIS — R410 Disorientation, unspecified: Secondary | ICD-10-CM | POA: Diagnosis not present
